# Patient Record
Sex: Female | Born: 1965 | Race: White | Hispanic: No | Marital: Married | Smoking: Never smoker
Health system: Southern US, Community
[De-identification: ages and names within clinical notes are randomized; demographics above are authoritative.]

## PROBLEM LIST (undated history)

## (undated) DIAGNOSIS — E162 Hypoglycemia, unspecified: Secondary | ICD-10-CM

## (undated) DIAGNOSIS — K589 Irritable bowel syndrome without diarrhea: Secondary | ICD-10-CM

## (undated) DIAGNOSIS — Z923 Personal history of irradiation: Secondary | ICD-10-CM

## (undated) HISTORY — DX: Irritable bowel syndrome, unspecified: K58.9

## (undated) HISTORY — DX: Personal history of irradiation: Z92.3

## (undated) HISTORY — DX: Hypoglycemia, unspecified: E16.2

---

## 1992-10-11 HISTORY — PX: HERNIA REPAIR: SHX51

## 2007-08-03 ENCOUNTER — Ambulatory Visit: Payer: Self-pay | Admitting: Internal Medicine

## 2007-08-18 ENCOUNTER — Ambulatory Visit: Payer: Self-pay | Admitting: Internal Medicine

## 2007-08-18 ENCOUNTER — Ambulatory Visit (HOSPITAL_COMMUNITY): Admission: RE | Admit: 2007-08-18 | Discharge: 2007-08-18 | Payer: Self-pay | Admitting: Internal Medicine

## 2007-11-17 ENCOUNTER — Ambulatory Visit: Payer: Self-pay | Admitting: Internal Medicine

## 2008-06-06 ENCOUNTER — Ambulatory Visit: Payer: Self-pay | Admitting: Internal Medicine

## 2011-02-23 NOTE — Assessment & Plan Note (Signed)
NAME:  Tammie Gilmore, Tammie Gilmore                  CHART#:  [id number]   DATE:  06/06/2008                       DOB:  [birth year]   PROBLEM LIST:  1. IBS, constipation predominant.  2. Ileocolonoscopy on August 18, 2007, by Dr. Jena Gauss, was normal.  3. TSH and calcium level normal, October 2008.   SUBJECTIVE:  The patient is a 45 year old lady with a history of IBS,  constipation predominant.  She was last seen in February 2009.  She  tends to have chronic constipation with occasional episodes of diarrhea,  usually if she is anxious about something and sometimes after certain  foods.  Two weeks out of the month, usually the first week before her  cycle and during her cycle, her bowel movements are more frequent and  sometimes even more loose.  Recently, she has been having increasing  difficulty with constipation.  About a week ago, she ate some home made  ice cream and this caused left lower quadrant abdominal pain.  She  states she is very uncomfortable.  She relates she had not been to the  bathroom in several days.  She has had a few stools and she started  taking [employer] one daily.  She does not feel like she has  had complete relief.  She denies any blood in the stool, melena,  abdominal pain, nausea, or vomiting.   CURRENT MEDICATIONS:  [facility] one daily.   ALLERGIES:  No known drug allergies.   PHYSICAL EXAMINATION:  VITAL SIGNS:  Weight 130 up 2 pounds, temp 98.2,  blood pressure 100/64, and pulse 80.  GENERAL:  A pleasant, well-nourished, well-developed, Caucasian female,  in no acute distress.  SKIN:  Warm and dry.  No jaundice.  ABDOMEN:  Positive bowel sounds.  Abdomen is soft, nontender, and  nondistended.  No organomegaly or masses.  No rebound or guarding.   IMPRESSION:  Constipation with occasional diarrhea likely due to  irritable bowel syndrome.  Unremarkable ileocolonoscopy previously.  We  did discuss today about remote possibility of bloating  and bowel issues  to be due to celiac disease.   PLAN:  1. High-fiber diet.  2. Complete a 30-day course of [employer].  3. May use Colace 100 mg daily as well as FiberChoice 2 tablets daily.  4. May use Amitiza 8 mcg up to b.i.d. p.r.n. constipation to take with      food.  She is advised against pregnancy while on the medication due      to the risk of miscarriage.  5. Celiac disease antibody panel and IgA level.  6. She is due for a physical.  I have asked her to follow up with Dr.      Ralph Dowdy.  7. Office visit on a p.r.n. basis.       Tana Coast, P.A.  Electronically Signed     R. Roetta Sessions, M.D.  Electronically Signed    LL/MEDQ  D:  06/06/2008  T:  06/06/2008  Job:  [id number]   cc:   Ernestina Penna

## 2011-02-23 NOTE — H&P (Signed)
NAME:  Tammie Gilmore, Tammie Gilmore                 ACCOUNT NO.:  [id number]   MEDICAL RECORD NO.:  [id number]          PATIENT TYPE:  AMB   LOCATION:  DAY                           FACILITY:  [facility]   PHYSICIAN:  R. Roetta Sessions, M.D. DATE OF BIRTH:  1966/07/12   DATE OF ADMISSION:  08/03/2007  DATE OF DISCHARGE:  LH                              HISTORY & PHYSICAL   REASON FOR VISIT:  This is a History and Physical for a colonoscopy.   CHIEF COMPLAINT:  Chronic constipation, worse over the last 2  months/Hemoccult positive stool.   HISTORY OF PRESENT ILLNESS:  Tammie Gilmore is a 45 year old Caucasian  female with longstanding history of constipation.  She describes  symptoms even as a child with being unable to go the bathroom.  Her  mother used to give her a yellow liquid to have a bowel movement.  She  had been seen in consultation by myself and Dr. Jena Gauss four years ago and  was felt to have IBS constipation.  She increased her daily fiber and  was using stool softeners and was doing very well.  She was scheduled to  come back for follow-up on December 12, 2003, and she did not show up for  that appointment.  She tells me she was getting along very nicely until  about 2 months ago.  She tells me she has had the sensation where she  just feels as though she cannot go to the bathroom.  She always has  symptoms of chronic constipation where she will go 2-3 days with a bowel  movement, but more recently she went up to a week without a bowel  movement.  She has significant cramping and a stabbing pain to the left  lower quadrant.  She tells me she has a sensation of labor pains.  She  also complained of an inability to defecate.  She feels as though she  pushes but cannot get things out.  She tells me she has also been under  a significant amount of stress.  She occasionally has episodes of  diarrhea, especially when she goes out to eat or eats certain foods.  She has a hard time tolerating mayonnaise or  fried foods.  She has had  symptoms with alternating diarrhea and constipation for years but mostly  constipation.  She denies any rectal bleeding or melena.  Denies any  anorexia or early satiety.  Her weight is actually up 9 pounds since we  saw her four years ago   PAST MEDICAL AND SURGICAL HISTORY:  1. History of hyperglycemia.  2. History of IBSC.  3. Gestational diabetes.  4. 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 Orvan Falconer in the past.  5. Hernia repair in 1994.   CURRENT MEDICATIONS:  Over-the-counter fiber supplement once daily,  stool softeners p.r.n.   ALLERGIES:  NO KNOWN DRUG ALLERGIES.   FAMILY HISTORY:  Both parents deceased secondary coronary disease at age  46 and 51.  She has two brothers, one with hypertension  and one with  IBS.   SOCIAL HISTORY:  Tammie Gilmore is married.  She has three healthy children.  She is employed with [employer].  She denies any tobacco,  alcohol or drug use.   REVIEW OF SYSTEMS:  See HPI, otherwise negative.  GYN:  She has regular  28-day cycles.  She is not trying to get pregnant, but not using birth  control at this time.   PHYSICAL EXAMINATION:  VITAL SIGNS: Weight 128 pounds, height 68 inches,  temperature 98.3, blood pressure 100/70, pulse 80.  GENERAL:  Tammie Gilmore is a well-developed, well-nourished Caucasian  female in no acute distress.  HEENT:  Sclerae are clear, nonicteric.  Conjunctivae are pink.  Oropharynx pink and moist without any lesions.  NECK:  Supple without any mass or thyromegaly.  CHEST:  Heart regular rate and rhythm with normal S1, S2 without any  murmurs, clicks, rubs or gallops.  LUNGS:  Clear to auscultation bilaterally.  ABDOMEN:  Positive bowel sounds x4.  No bruits auscultated.  Soft,  nontender, nondistended with no palpable mass or hepatosplenomegaly.  No  rebound tenderness or guarding.  RECTAL: No external lesions visualized.  Good sphincter  tone.  On  Internal exam, she has a palpable fullness at the very distal reach.  Therefore, couple of centimeters inside the rectum, there is sudden  nontender anterior fullness difficult to ascertain.  A small amount of  light brown stool was obtained in the vault which was Hemoccult  positive.  EXTREMITIES:  Without clubbing or edema bilaterally.  SKIN:  Pink, warm, dry without rash or jaundice   IMPRESSION:  Thoracic a back up to the review of systems and had GYN of  the review of systems.   IMPRESSION:  Tammie Gilmore is a 45 year old Caucasian female with chronic  constipation, more recently, over the last 2 months, she has had  significant tenesmus and the feeling that she just cannot go to the  bathroom as if there is something blocking.  On rectal exam, she does  have an anterior fullness and found to have Hemoccult positive stools.  Therefore, I feel she needs to have further evaluation given her marked  change in symptoms.   PLAN:  1. See Dr. Jena Gauss in the near future.  I discussed procedure including      risks and benefits which include but are not limited to bleeding,      infection, perforation, drug reaction.  She agrees with plan,      consent will be obtained.  2. Amitiza 8 mcg with food daily or b.i.d. as needed for constipation.      We have given her a couple of boxes of samples today in the office.  3. Constipation literature given for review.      Lorenza Burton, N.P.      Jonathon Bellows, M.D.  Electronically Signed    KJ/MEDQ  D:  08/03/2007  T:  08/04/2007  Job:  [id number]

## 2011-02-23 NOTE — Op Note (Signed)
NAME:  Tammie Gilmore, Tammie Gilmore                 ACCOUNT NO.:  [id number]   MEDICAL RECORD NO.:  [id number]          PATIENT TYPE:  AMB   LOCATION:  DAY                           FACILITY:  [facility]   PHYSICIAN:  R. Roetta Sessions, M.D. DATE OF BIRTH:  1966-01-09   DATE OF PROCEDURE:  08/18/2007  DATE OF DISCHARGE:                               OPERATIVE REPORT   DIAGNOSTIC ILEOCOLONOSCOPY:   INDICATIONS FOR PROCEDURE:  A 45 year old lady with lifelong  constipation with rare self-limiting episodes of diarrhea, who perceives  her constipation has worsened recently.  She has difficulty evacuating.  Rectal exam the office recently demonstrated Hemoccult-positive stool.  She was started on Amitiza 8 mcg daily, which she has only been taking  once daily and only p.r.n. and only approximately 3 times since her  office visit on August 03, 2007.  She stated that it softened her  stool somewhat.  Calcium came back normal at 9.3.  TSH normal at 1.15.  The colonoscopy is now being done to further evaluate her symptoms.  This approach has been discussed with the patient at length.  Potential  risks, benefits and alternatives have been reviewed, questions answered.  She is agreeable.  Please see the documentation in the medical record.   PROCEDURE NOTE:  O2 saturation, blood pressure, pulse and respirations  were monitored throughout the entire procedure.  Conscious sedation:  Versed 5 mg IV, Demerol 125 mg IV in divided doses.  Instrument:  Pentax  video chip system.   FINDINGS:  Digital rectal exam revealed no abnormalities.  Endoscopic  findings:  The prep was excellent.   Colon:  The colonic mucosa was surveyed from the rectosigmoid junction  through the left, transverse, right colon to area of the appendiceal  orifice, ileocecal valve and cecum.  These structures were well-seen and  photographed for the record.  The terminal ileum was intubated to 10 cm.  From this level the scope was slowly withdrawn  and all previously-  mentioned mucosal surfaces were again seen.  The colonic mucosa appeared  normal, as did terminal ileal mucosa.  The scope was pulled down into  the rectum.  The rectal vault was small.  I attempted to retroflex but  was unable to do so.  However, for the same reason I was able see the  rectal mucosa very well en face and it appeared normal down to the anal  verge.  The patient tolerated the procedure well, was reacted in  endoscopy.   IMPRESSION:  1. Normal rectum.  2. Normal colon.  3. Normal terminal ileum.   I suspect the patient has constipation-predominant irritable bowel  syndrome.  There may be some overlap with functional outlet obstruction  (pelvic floor dyssynergy).   RECOMMENDATIONS:  She really needs to take Amitiza every day.  I have  recommended that she start with Amitiza 8 mcg daily with breakfast for  the next 3 weeks (21 days), keep a stool diary, and call us and let us  know how she is doing with that and we will decide about upping her  medication.  Amitiza is really not to be taken p.r.n.  We will plan to  see this nice lady back in the office in 6 weeks.      Tammie Gilmore, M.D.  Electronically Signed     RMR/MEDQ  D:  08/18/2007  T:  08/19/2007  Job:  [id number]   cc:   [facility]  [place], [place]

## 2011-02-23 NOTE — Assessment & Plan Note (Signed)
NAME:  [place], DOST                  CHART#:  [id number]   DATE:  11/17/2007                       DOB:  April 08, 1966   CHIEF COMPLAINT:  Followup of IBS.   SUBJECTIVE:  M[place] is here for followup.  She was last seen at time of  colonoscopy on August 18, 2007.  She has life-long constipation and  rare self-limiting episodes of diarrhea.  She was heme positive and that  was the reasoning for a colonoscopy.  She had a normal rectum, colon and  terminal ileum.  It was felt that she had constipation, predominant IBS.  She says she has actually been doing fairly well.  She took Amitiza as  prescribed, has really been off of it for about a month or so.  Her  bowel movements have been very regular up until the last 1 week.  She  has increased her dietary fiber this past week and added fiber  supplement, although is only getting about 1 gram daily of supplement.  She has only had a couple episodes of diarrhea.  She denies any  significant abdominal pain.  She does have some cramping, which is  relieved by defecation, at times.  Denies any nausea or vomiting.  No  heartburn.  Overall, she feels well.   CURRENT MEDICATIONS:  See updated list.   ALLERGIES:  No known drug allergies.   PHYSICAL EXAM:  Weight 128, temp 99, however she had been drinking  coffee, blood pressure 98/70, pulse 60.  GENERAL:  A pleasant, well-nourished, well-developed Caucasian female in  no acute distress.  Skin warm and dry, no jaundice.  HEENT:  Sclera nonicteric.  Oropharyngeal mucosa moist and pink.  ABDOMEN:  Positive bowel sounds, soft, nontender, nondistended.  No  organomegaly or masses.  No rebound or guarding.   IMPRESSION:  Ms. Matsunaga is a pleasant 45 year old lady with chronic  constipation with occasional diarrhea.  She is felt to have  constipation, predominant irritable bowel syndrome.  She has been doing  fairly well lately.   PLAN:  1. She will continue a high fiber diet.  2. Suggested adding a  fiber supplement, approximately 3-4 grams daily,      such as Benefiber or FiberChoice.  3. Align, one daily for 4 weeks, sample was provided.  4. She may use Amitiza as needed, 8 mcg b.i.d. with food, #20 samples      provided.  She was instructed of the risk of pregnancy on this      medication.  5. IBS literature provided to the patient.  6. She will call with any further problems.  Office visit as needed.       Tana Coast, P.A.  Electronically Signed     R. Roetta Sessions, M.D.  Electronically Signed    LL/MEDQ  D:  11/17/2007  T:  11/19/2007  Job:  [id number]

## 2016-12-24 DIAGNOSIS — J309 Allergic rhinitis, unspecified: Secondary | ICD-10-CM | POA: Diagnosis not present

## 2016-12-24 DIAGNOSIS — R05 Cough: Secondary | ICD-10-CM | POA: Diagnosis not present

## 2016-12-24 DIAGNOSIS — Z6821 Body mass index (BMI) 21.0-21.9, adult: Secondary | ICD-10-CM | POA: Diagnosis not present

## 2017-07-11 DIAGNOSIS — Z124 Encounter for screening for malignant neoplasm of cervix: Secondary | ICD-10-CM | POA: Diagnosis not present

## 2017-07-11 DIAGNOSIS — Z6820 Body mass index (BMI) 20.0-20.9, adult: Secondary | ICD-10-CM | POA: Diagnosis not present

## 2017-07-11 DIAGNOSIS — Z01419 Encounter for gynecological examination (general) (routine) without abnormal findings: Secondary | ICD-10-CM | POA: Diagnosis not present

## 2018-01-30 DIAGNOSIS — R002 Palpitations: Secondary | ICD-10-CM | POA: Diagnosis not present

## 2018-01-30 DIAGNOSIS — Z6821 Body mass index (BMI) 21.0-21.9, adult: Secondary | ICD-10-CM | POA: Diagnosis not present

## 2018-02-02 DIAGNOSIS — R002 Palpitations: Secondary | ICD-10-CM | POA: Diagnosis not present

## 2018-09-27 DIAGNOSIS — Z23 Encounter for immunization: Secondary | ICD-10-CM | POA: Diagnosis not present

## 2019-05-15 DIAGNOSIS — D489 Neoplasm of uncertain behavior, unspecified: Secondary | ICD-10-CM | POA: Diagnosis not present

## 2019-05-15 DIAGNOSIS — L820 Inflamed seborrheic keratosis: Secondary | ICD-10-CM | POA: Diagnosis not present

## 2019-06-06 DIAGNOSIS — Z0000 Encounter for general adult medical examination without abnormal findings: Secondary | ICD-10-CM | POA: Diagnosis not present

## 2019-06-06 DIAGNOSIS — Z6822 Body mass index (BMI) 22.0-22.9, adult: Secondary | ICD-10-CM | POA: Diagnosis not present

## 2019-06-11 DIAGNOSIS — Z1231 Encounter for screening mammogram for malignant neoplasm of breast: Secondary | ICD-10-CM | POA: Diagnosis not present

## 2019-06-13 DIAGNOSIS — R922 Inconclusive mammogram: Secondary | ICD-10-CM | POA: Diagnosis not present

## 2019-06-13 DIAGNOSIS — N6489 Other specified disorders of breast: Secondary | ICD-10-CM | POA: Diagnosis not present

## 2019-06-26 DIAGNOSIS — Z23 Encounter for immunization: Secondary | ICD-10-CM | POA: Diagnosis not present

## 2019-06-26 DIAGNOSIS — L820 Inflamed seborrheic keratosis: Secondary | ICD-10-CM | POA: Diagnosis not present

## 2019-06-30 DIAGNOSIS — S80261A Insect bite (nonvenomous), right knee, initial encounter: Secondary | ICD-10-CM | POA: Diagnosis not present

## 2019-06-30 DIAGNOSIS — Z6822 Body mass index (BMI) 22.0-22.9, adult: Secondary | ICD-10-CM | POA: Diagnosis not present

## 2019-12-17 DIAGNOSIS — J0101 Acute recurrent maxillary sinusitis: Secondary | ICD-10-CM | POA: Diagnosis not present

## 2019-12-17 DIAGNOSIS — Z20828 Contact with and (suspected) exposure to other viral communicable diseases: Secondary | ICD-10-CM | POA: Diagnosis not present

## 2019-12-17 DIAGNOSIS — J069 Acute upper respiratory infection, unspecified: Secondary | ICD-10-CM | POA: Diagnosis not present

## 2019-12-17 DIAGNOSIS — R42 Dizziness and giddiness: Secondary | ICD-10-CM | POA: Diagnosis not present

## 2019-12-21 DIAGNOSIS — Z20828 Contact with and (suspected) exposure to other viral communicable diseases: Secondary | ICD-10-CM | POA: Diagnosis not present

## 2019-12-21 DIAGNOSIS — J069 Acute upper respiratory infection, unspecified: Secondary | ICD-10-CM | POA: Diagnosis not present

## 2019-12-21 DIAGNOSIS — R002 Palpitations: Secondary | ICD-10-CM | POA: Diagnosis not present

## 2020-01-05 DIAGNOSIS — Z20828 Contact with and (suspected) exposure to other viral communicable diseases: Secondary | ICD-10-CM | POA: Diagnosis not present

## 2020-01-06 ENCOUNTER — Ambulatory Visit: Payer: Self-pay | Attending: Internal Medicine

## 2020-01-06 DIAGNOSIS — Z23 Encounter for immunization: Secondary | ICD-10-CM

## 2020-01-06 NOTE — Progress Notes (Signed)
   Covid-19 Vaccination Clinic  Name:  Tammie Gilmore    MRN: [id number] DOB: [birth year]  01/06/2020  [name] was observed post Covid-19 immunization for 15 minutes without incident. She was provided with Vaccine Information Sheet and instruction to access the V-Safe system.   [name] was instructed to call 911 with any severe reactions post vaccine: Marland Kitchen Difficulty breathing  . Swelling of face and throat  . A fast heartbeat  . A bad rash all over body  . Dizziness and weakness   Immunizations Administered    Name Date Dose VIS Date Route   Pfizer COVID-19 Vaccine 01/06/2020  5:49 PM 0.3 mL 09/21/2019 Intramuscular   Manufacturer: [place]   Lot: [id number]   [place]: [id number]

## 2020-01-16 ENCOUNTER — Ambulatory Visit: Payer: Self-pay

## 2020-01-18 DIAGNOSIS — Z20828 Contact with and (suspected) exposure to other viral communicable diseases: Secondary | ICD-10-CM | POA: Diagnosis not present

## 2020-01-24 DIAGNOSIS — Z136 Encounter for screening for cardiovascular disorders: Secondary | ICD-10-CM | POA: Insufficient documentation

## 2020-01-24 DIAGNOSIS — Z8249 Family history of ischemic heart disease and other diseases of the circulatory system: Secondary | ICD-10-CM | POA: Diagnosis not present

## 2020-01-24 DIAGNOSIS — Z13220 Encounter for screening for lipoid disorders: Secondary | ICD-10-CM | POA: Insufficient documentation

## 2020-01-24 DIAGNOSIS — R002 Palpitations: Secondary | ICD-10-CM | POA: Diagnosis not present

## 2020-02-11 ENCOUNTER — Ambulatory Visit: Payer: Self-pay

## 2020-02-22 DIAGNOSIS — R002 Palpitations: Secondary | ICD-10-CM | POA: Diagnosis not present

## 2020-02-28 DIAGNOSIS — Z6822 Body mass index (BMI) 22.0-22.9, adult: Secondary | ICD-10-CM | POA: Diagnosis not present

## 2020-02-28 DIAGNOSIS — Z8249 Family history of ischemic heart disease and other diseases of the circulatory system: Secondary | ICD-10-CM | POA: Diagnosis not present

## 2020-02-28 DIAGNOSIS — R002 Palpitations: Secondary | ICD-10-CM | POA: Diagnosis not present

## 2020-05-26 DIAGNOSIS — J029 Acute pharyngitis, unspecified: Secondary | ICD-10-CM | POA: Diagnosis not present

## 2020-05-26 DIAGNOSIS — J309 Allergic rhinitis, unspecified: Secondary | ICD-10-CM | POA: Diagnosis not present

## 2020-05-26 DIAGNOSIS — Z20828 Contact with and (suspected) exposure to other viral communicable diseases: Secondary | ICD-10-CM | POA: Diagnosis not present

## 2020-07-17 DIAGNOSIS — J029 Acute pharyngitis, unspecified: Secondary | ICD-10-CM | POA: Diagnosis not present

## 2020-07-17 DIAGNOSIS — J019 Acute sinusitis, unspecified: Secondary | ICD-10-CM | POA: Diagnosis not present

## 2020-07-17 DIAGNOSIS — Z20828 Contact with and (suspected) exposure to other viral communicable diseases: Secondary | ICD-10-CM | POA: Diagnosis not present

## 2020-07-31 DIAGNOSIS — B079 Viral wart, unspecified: Secondary | ICD-10-CM | POA: Diagnosis not present

## 2020-07-31 DIAGNOSIS — S29011A Strain of muscle and tendon of front wall of thorax, initial encounter: Secondary | ICD-10-CM | POA: Diagnosis not present

## 2020-07-31 DIAGNOSIS — H9202 Otalgia, left ear: Secondary | ICD-10-CM | POA: Diagnosis not present

## 2020-08-21 DIAGNOSIS — Z1231 Encounter for screening mammogram for malignant neoplasm of breast: Secondary | ICD-10-CM | POA: Diagnosis not present

## 2020-09-01 DIAGNOSIS — Z20828 Contact with and (suspected) exposure to other viral communicable diseases: Secondary | ICD-10-CM | POA: Diagnosis not present

## 2020-09-01 DIAGNOSIS — R928 Other abnormal and inconclusive findings on diagnostic imaging of breast: Secondary | ICD-10-CM | POA: Diagnosis not present

## 2020-09-07 DIAGNOSIS — U071 COVID-19: Secondary | ICD-10-CM | POA: Diagnosis not present

## 2020-09-07 DIAGNOSIS — E860 Dehydration: Secondary | ICD-10-CM | POA: Diagnosis not present

## 2020-09-07 DIAGNOSIS — R059 Cough, unspecified: Secondary | ICD-10-CM | POA: Diagnosis not present

## 2020-09-07 DIAGNOSIS — Z8249 Family history of ischemic heart disease and other diseases of the circulatory system: Secondary | ICD-10-CM | POA: Diagnosis not present

## 2020-09-07 DIAGNOSIS — R0602 Shortness of breath: Secondary | ICD-10-CM | POA: Diagnosis not present

## 2021-06-19 ENCOUNTER — Ambulatory Visit
Admission: RE | Admit: 2021-06-19 | Discharge: 2021-06-19 | Disposition: A | Discharge status: Home / Self Care | Payer: Self-pay | Source: Ambulatory Visit | Attending: Gynecologic Oncology | Admitting: Gynecologic Oncology

## 2021-06-19 ENCOUNTER — Inpatient Hospital Stay: Payer: BC Managed Care – PPO | Attending: Gynecologic Oncology | Admitting: Gynecologic Oncology

## 2021-06-19 ENCOUNTER — Encounter: Payer: Self-pay | Admitting: Gynecologic Oncology

## 2021-06-19 ENCOUNTER — Other Ambulatory Visit: Payer: Self-pay

## 2021-06-19 VITALS — BP 119/71 | HR 104 | Temp 98.4°F | Resp 18 | Ht 67.0 in | Wt 144.0 lb

## 2021-06-19 DIAGNOSIS — D390 Neoplasm of uncertain behavior of uterus: Secondary | ICD-10-CM | POA: Diagnosis present

## 2021-06-19 DIAGNOSIS — K581 Irritable bowel syndrome with constipation: Secondary | ICD-10-CM | POA: Diagnosis not present

## 2021-06-19 DIAGNOSIS — E162 Hypoglycemia, unspecified: Secondary | ICD-10-CM | POA: Diagnosis not present

## 2021-06-19 DIAGNOSIS — N858 Other specified noninflammatory disorders of uterus: Secondary | ICD-10-CM

## 2021-06-19 DIAGNOSIS — K589 Irritable bowel syndrome without diarrhea: Secondary | ICD-10-CM | POA: Insufficient documentation

## 2021-06-19 DIAGNOSIS — Z8616 Personal history of COVID-19: Secondary | ICD-10-CM | POA: Insufficient documentation

## 2021-06-19 DIAGNOSIS — C539 Malignant neoplasm of cervix uteri, unspecified: Secondary | ICD-10-CM | POA: Diagnosis not present

## 2021-06-19 NOTE — Patient Instructions (Signed)
It was a pleasure meeting you.  I will call you with your biopsy results, hopefully early next week.  I will also call you once we get the MRI results back.  Based on what I see today, it looks like you are in the process of expelling (pushing out) this mass.  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  Once we get the MRI results back as well as her biopsy, then we will make a determination about whether entering other imaging studies or biopsies need to be done or whether we will proceed with surgery.

## 2021-06-19 NOTE — Progress Notes (Signed)
GYNECOLOGIC ONCOLOGY NEW PATIENT CONSULTATION   Patient Name: Tammie Gilmore  Patient Age: 55 y.o. Date of Service: 06/19/21 Referring Provider: Georgie Chard, Eneta[place]   Primary Care Provider: Curlene Labrum, MD Consulting Provider: Jeral Pinch, MD   Assessment/Plan:  Perimenopausal patient with large prolapsing lower uterine segment versus cervical mass.  Reviewed outside CT images. Discussed findings from CT as well as ultrasound with the patient. There are features on CT that raise the concern for necrosis of her prolapsing mass. Given history of known uterine fibroids, we discussed possibility that this is a degenerating uterine fibroid that is now prolapsing. I took several biopsies of the mass today and expect results back early next week. I think that an MRI may be helpful for treatment planning and the possibility of surgery, so this was ordered and scheduled today.   In addition to uterine findings on CT scan, there is what appears to be a partially necrotic pelvic lymph node. This raises the concern for malignancy. It is likely too small for percutaneous biopsy. If biopsies from today show benign findings, then we may pursue deeper mass biopsies with Trucut biopsy.  Prior ultrasounds were obtained after patient left clinic. These confirm that she was followed back in 2011 for a complex cyst and then for abnormal bleeding in 2015.   A copy of this note was sent to the patient's referring provider.   80 minutes of total time was spent for this patient encounter, including preparation, face-to-face counseling with the patient and coordination of care, and documentation of the encounter.  Jeral Pinch, MD  Division of Gynecologic Oncology  Department of Obstetrics and Gynecology  [facility]  ___________________________________________  Chief Complaint: Chief Complaint  Patient presents with   Uterine mass    History of Present Illness:   Tammie Gilmore is a 55 y.o. y.o. female who is seen in consultation at the request of Bryan Lemma FNP for an evaluation of uterine mass.  Patient reports a history of fibroids.  This was followed back in 2011 with ultrasound and again in 2015.  Patient does not remember why she initially had the ultrasound but remembers being told that her fibroids were not concerning.  Patient has been perimenopausal for the last couple of years.  She has had several periods of up to 6 months between bleeding episodes.  In mid July, she began having bleeding that was initially light but required the use of a pad.  She had ultimately some passage of clots and her bleeding lasted for about 2 weeks.  She had not been seen recently and called to get an annual exam.  After this episode of bleeding, she began having some cramping as well as pressure after she urinates and has a bowel movement.  She is unsure whether this is truly pressure or more relief.  Secondary to getting COVID in July, she did not ultimately get seen until late August.  She denies any vaginal bleeding since the episode described above in mid July.  Patient had a Pap test done and was sent for an ultrasound.  Given ultrasound findings showing a large fibroid appearing structure in the lower uterine segment, CT scan was obtained.  Patient reports a history of IBS with constipation as her norm.  She denies any changes to her bowel function recently.  She was treated for a urinary tract infection in August.  This was diagnosed shortly after she got a tick bite in July.  She  was given Augmentin both for treatment of the tick bite as well as her urinary tract infection.  She endorses a good appetite without any nausea or emesis.  She thinks she has gained about 10 pounds over the last 1 to 2 years.  Patient lives in [place] with her husband and her oldest son.  She works as an Optometrist.  PAST MEDICAL HISTORY:  Past Medical History:  Diagnosis Date    Hypoglycemia    occasional episodes of hypoglycemia   IBS (irritable bowel syndrome)      PAST SURGICAL HISTORY:  Past Surgical History:  Procedure Laterality Date   HERNIA REPAIR  1994   left inguinal, with mesh    OB/GYN HISTORY:  OB History  Gravida Para Term Preterm AB Living  4 3          SAB IAB Ectopic Multiple Live Births               # Outcome Date GA Lbr Len/2nd Weight Sex Delivery Anes PTL Lv  4 Gravida           3 Para           2 Para           1 Para             No LMP recorded.  Age at menarche: 47 Age at menopause: 11 Hx of HRT: Denies Hx of STDs: Denies Last pap: 2022, ASCUS, HPV negative History of abnormal pap smears: Only most recent Pap smear, otherwise Pap smears normal  SCREENING STUDIES:  Last mammogram: 04/2021  Last colonoscopy: Approximately 10 years ago  MEDICATIONS: No outpatient encounter medications on file as of 06/19/2021.   No facility-administered encounter medications on file as of 06/19/2021.    ALLERGIES:  Allergies  Allergen Reactions   Doxycycline     Dizziness, NAUSEA     FAMILY HISTORY:  Family History  Problem Relation Age of Onset   Heart attack Mother    Heart disease Mother    Endometriosis Mother    Heart disease Father    Heart attack Father    Cancer - Colon Neg Hx    Breast cancer Neg Hx    Cancer Neg Hx    Ovarian cancer Neg Hx    Uterine cancer Neg Hx    Pancreatic cancer Neg Hx    Pancreatic disease Neg Hx    Prostate cancer Neg Hx      SOCIAL HISTORY:  Social Connections: Not on file    REVIEW OF SYSTEMS:  Pertinent positives include urinary frequency, vaginal discharge, anxiety, swollen glands in her neck. Denies appetite changes, fevers, chills, fatigue, unexplained weight changes. Denies hearing loss, neck lumps or masses, mouth sores, ringing in ears or voice changes. Denies cough or wheezing.  Denies shortness of breath. Denies chest pain or palpitations. Denies leg swelling. Denies  abdominal distention, pain, blood in stools, constipation, diarrhea, nausea, vomiting, or early satiety. Denies pain with intercourse, dysuria, hematuria or incontinence. Denies hot flashes, pelvic pain, vaginal bleeding.   Denies joint pain, back pain or muscle pain/cramps. Denies itching, rash, or wounds. Denies dizziness, headaches, numbness or seizures. Denies denies easy bruising or bleeding. Denies depression, confusion, or decreased concentration.  Physical Exam:  Vital Signs for this encounter:  Blood pressure 119/71, pulse (!) 104, temperature 98.4 F (36.9 C), temperature source Oral, resp. rate 18, height '5\' 7"'$  (1.702 m), weight 144 lb (65.3 kg), SpO2 100 %. Body mass  index is 22.55 kg/m. General: Alert, oriented, no acute distress.  HEENT: Normocephalic, atraumatic. Sclera anicteric.  Chest: Clear to auscultation bilaterally. No wheezes, rhonchi, or rales. Cardiovascular: Regular rate (90s) and rhythm, no murmurs, rubs, or gallops.  Abdomen: Normoactive bowel sounds. Soft, nondistended, nontender to palpation. No masses or hepatosplenomegaly appreciated. No palpable fluid wave.  Extremities: Grossly normal range of motion. Warm, well perfused. No edema bilaterally.  Skin: No rashes or lesions.  Lymphatics: No cervical, supraclavicular, or inguinal adenopathy.  GU:  Normal external female genitalia. No lesions. No discharge or bleeding.             Bladder/urethra:  No lesions or masses, well supported bladder             Vagina: Well rugated, no lesions.             Cervix: Cervix is dilated, significantly so that on speculum exam I cannot appreciate the posterior cervix.  The anterior cervix is visually normal.  There is a mass prolapsing up to but not through the external cervical os dilating it visually such that it fills the vagina.  The mass itself appears necrotic versus degenerating, some atypical mass, mostly smooth but some areas that almost looks cystic.  On bimanual  exam, the cervix feels to be dilated approximately 8 cm with normalcy cervix around the entire mass.  There are a couple areas with some nodularity along the mass, most of it is smooth.              Uterus: Approximately 10-12 cm, quite bulbous within the cervix and lower uterine segment, minimally mobile.  Notable nodularity or parametrial involvement appreciated.               Adnexa: No masses appreciated.  Rectal: See above.  Prolapsing mass biopsy procedure Preoperative diagnosis: Prolapsing intracervical mass Postoperative diagnosis: Same as above Physician: Berline Lopes MD Estimated blood loss: 20 cc Specimens: Multiple mass biopsies Procedure: After the procedure including risks and alternatives were discussed with the patient, she gave verbal consent.  Patient was placed in dorsolithotomy position and a speculum was placed in the vagina.  Findings were as noted above.  The mass was cleansed x3 with Betadine.  3 biopsies were taken with Tischler forceps and placed in formalin.  Commendation of pressure and silver nitrate was used to achieve hemostasis.  Overall the patient tolerated the procedure well.  LABORATORY AND RADIOLOGIC DATA:  Outside medical records were reviewed to synthesize the above history, along with the history and physical obtained during the visit.   No results found for: WBC, HGB, HCT, PLT, GLUCOSE, CHOL, TRIG, HDL, LDLDIRECT, LDLCALC, ALT, AST, NA, K, CL, CREATININE, BUN, CO2, TSH, PSA, INR, GLUF, HGBA1C, MICROALBUR  Pelvic ultrasound exam performed at an outside facility on 8/30 shows a uterus measuring 12.8 x 8 x 9 cm, retroverted.  Heterogenous myometrium noted.  Large leiomyoma with shadowing at mid to lower uterus measuring up to 7.7 cm likely extending submucosal based on size and position through endometrial complex.  This is poorly seen in the lower uterine segment.  Endometrium is 5 mm at the fundus, and asked to quickly visualized in the lower uterine segment.   Bilateral ovaries normal without apparent mass.  CT scan of the abdomen and pelvis performed on 9/3: There is a 9.5 x 7.6 x 9 cm largely necrotic mass involving the lower uterine segment and cervix thought to be either endometrial or cervical in origin, possibly concerning for  cancer.  No obvious direct extension into the parametrium.  Ovaries noted to be unremarkable.  2.9 cm fundal fibroid noted.  There is a 9 mm left pelvic sidewall lymph node that is partially necrotic, worrisome for metastatic adenopathy.  No other overt adenopathy noted.  No omental or peritoneal soft tissue masses identified.  Records obtained after the patient left from diagnostic imaging in [place]: Pelvic ultrasound in 06/2014 for irregular menses: Uterus measures 9.1 x 5.1 x 6.3 cm.  There is a up to 3.6 cm intramural fibroid in the posterior lower uterine segment.  There is an up to 4.6 cm partially exophytic fibroid within the posterior uterine fundus.  There is an additional 2.1 cm intraluminal fibroid within the posterior uterine body.  Lateral ovaries normal-appearing. Pelvic ultrasound in June 2011: Uterus measures 9 cm in greatest diameter with an exophytic fibroid in the posterior uterine body.  Right ovary normal-appearing.  Left ovary measures up to 3.9 cm with what appears to be a resolving hemorrhagic cyst that measures 2.5 cm on today's exam. Pelvic ultrasound from April 2011: Right ovary with thin up to 1.8 cm cystic lesion with septations.  Left ovary measures up to 4.6 cm.  There is a complex cystic lesion with internal echoes and septations measuring up to 3.3 cm in the left ovary.

## 2021-06-22 ENCOUNTER — Other Ambulatory Visit: Payer: Self-pay

## 2021-06-22 ENCOUNTER — Ambulatory Visit (HOSPITAL_COMMUNITY)
Admission: RE | Admit: 2021-06-22 | Discharge: 2021-06-22 | Disposition: A | Discharge status: Home / Self Care | Payer: BC Managed Care – PPO | Source: Ambulatory Visit | Attending: Gynecologic Oncology | Admitting: Gynecologic Oncology

## 2021-06-22 DIAGNOSIS — N858 Other specified noninflammatory disorders of uterus: Secondary | ICD-10-CM

## 2021-06-22 MED ORDER — GADOBUTROL 1 MMOL/ML IV SOLN
7.0000 mL | Freq: Once | INTRAVENOUS | Status: AC | PRN
  Administered 2021-06-22: 7 mL via INTRAVENOUS

## 2021-06-23 ENCOUNTER — Telehealth: Payer: Self-pay | Admitting: *Deleted

## 2021-06-23 ENCOUNTER — Telehealth: Payer: Self-pay | Admitting: Gynecologic Oncology

## 2021-06-23 ENCOUNTER — Other Ambulatory Visit: Payer: Self-pay | Admitting: Gynecologic Oncology

## 2021-06-23 DIAGNOSIS — N858 Other specified noninflammatory disorders of uterus: Secondary | ICD-10-CM

## 2021-06-23 NOTE — Telephone Encounter (Signed)
Called the patient to discuss MRI findings.  Additionally, received a phone call from pathologist reviewing her biopsies, which are concerning for cancer (sounds like differential includes leiomyosarcoma versus cervical cancer).  It is likely that they will need more tissue to help with definitive diagnosis.  I have asked the patient to return on Friday for Tru-Cut biopsies here in clinic.  Have also ordered a PET scan to evaluate for metastatic disease given either locally advanced cervix cancer versus leiomyosarcoma.  Jeral Pinch MD Gynecologic Oncology

## 2021-06-23 NOTE — Telephone Encounter (Signed)
Tammie Gilmore called to let us know that she may be having low grade fevers in the evenings. Requested for her to take her temp and let us know if she has a fever. She notified us that she had a recent chest xray though her family doctor. I will call the office to obtain a copy of the xray. She was made aware of her upcoming PET scan at [facility] for 07/02/21 at 10:00am. Nothing to eat or dink , except water for 6 hours prior to the PET. She is to arrive 15 minutes before her appt. Time. Tammie Gilmore verbalized understanding.

## 2021-06-24 ENCOUNTER — Telehealth: Payer: Self-pay | Admitting: Gynecologic Oncology

## 2021-06-24 LAB — SURGICAL PATHOLOGY

## 2021-06-24 NOTE — Telephone Encounter (Signed)
Called patient to discuss pathology results - appears this is likely uterine in origin, either LMS or carcinosarcoma. Pathologist does think that additional biopsies are likely to be helping in narrowing differential.   Plan is for PET scan - scheduled next week.  Given biopsy results (pending PET results regarding distant metastatic disease), surgery is likely to be the first step in treatment. This would involve a radical hysterectomy (given what appears to be parametrial invasion) and possible colon resection given MRI findings concerning for possible rectal invasion. Depending on findings at the time of surgery, it is possible that colostomy rather than reanastomosis would be performed (if risk of low anastomosis, given anticipated adjuvant treatment). In the setting of stoma formation, this might be able to be reversed at some point in the future.  I will look at my surgery schedule at [facility] and also possibility of surgery at [facility]. I do not want to delay surgery longer than necessary and patient's surgery and postoperative recovery may be complex. Patient is amenable to having surgery at either location.  Jeral Pinch MD Gynecologic Oncology

## 2021-06-26 ENCOUNTER — Inpatient Hospital Stay: Payer: BC Managed Care – PPO

## 2021-06-26 ENCOUNTER — Ambulatory Visit: Payer: BC Managed Care – PPO | Admitting: Gynecologic Oncology

## 2021-06-26 ENCOUNTER — Telehealth: Payer: Self-pay | Admitting: *Deleted

## 2021-06-26 ENCOUNTER — Other Ambulatory Visit: Payer: Self-pay

## 2021-06-26 ENCOUNTER — Telehealth: Payer: Self-pay

## 2021-06-26 DIAGNOSIS — N950 Postmenopausal bleeding: Secondary | ICD-10-CM

## 2021-06-26 DIAGNOSIS — D390 Neoplasm of uncertain behavior of uterus: Secondary | ICD-10-CM | POA: Diagnosis not present

## 2021-06-26 LAB — CBC WITH DIFFERENTIAL (CANCER CENTER ONLY)
Abs Immature Granulocytes: 0.04 10*3/uL (ref 0.00–0.07)
Basophils Absolute: 0 10*3/uL (ref 0.0–0.1)
Basophils Relative: 0 %
Eosinophils Absolute: 0 10*3/uL (ref 0.0–0.5)
Eosinophils Relative: 0 %
HCT: 38.4 % (ref 36.0–46.0)
Hemoglobin: 12.8 g/dL (ref 12.0–15.0)
Immature Granulocytes: 0 %
Lymphocytes Relative: 20 %
Lymphs Abs: 1.9 10*3/uL (ref 0.7–4.0)
MCH: 29.2 pg (ref 26.0–34.0)
MCHC: 33.3 g/dL (ref 30.0–36.0)
MCV: 87.7 fL (ref 80.0–100.0)
Monocytes Absolute: 0.4 10*3/uL (ref 0.1–1.0)
Monocytes Relative: 5 %
Neutro Abs: 7.1 10*3/uL (ref 1.7–7.7)
Neutrophils Relative %: 75 %
Platelet Count: 347 10*3/uL (ref 150–400)
RBC: 4.38 MIL/uL (ref 3.87–5.11)
RDW: 12.2 % (ref 11.5–15.5)
WBC Count: 9.5 10*3/uL (ref 4.0–10.5)
nRBC: 0 % (ref 0.0–0.2)

## 2021-06-26 NOTE — Telephone Encounter (Signed)
Tammie Gilmore called to report some lightheadedness and bleeding. The bleeding is bright red at times, but a small amount. She states that she has not been eating and drinking as much as she normally does since receiving the results of her MRI. She is trying to eat and drink more and reports that she feels less dizzy as the day goes on. After speaking w/ M. Cross NP it is recommended that Tammie Gilmore call back at at 1:00 to let us know how she is feeling, snd we can schedule a lab appointment for CBC if needed. Pt is agreeable with plan of care.

## 2021-06-26 NOTE — Telephone Encounter (Signed)
Received call from Tammie Gilmore, she states she is still having dizziness and lightheadedness. She has been trying to eat and drink more and feels it has improved slightly. Patient states she would like to have her labs checked today, her husband is going to drive her. Lab appointment scheduled for  2:45pm. Patient is in agreement of date and time of appointment.

## 2021-06-26 NOTE — Telephone Encounter (Signed)
I let Tammie Gilmore know that her CBC is stable her Hbg is 12.8 which is nearly the same as it was in 08/2020. She has the after hours number to call with any concerns over the weekend.

## 2021-06-29 ENCOUNTER — Ambulatory Visit
Admission: RE | Admit: 2021-06-29 | Discharge: 2021-06-29 | Disposition: A | Discharge status: Home / Self Care | Payer: BC Managed Care – PPO | Source: Ambulatory Visit | Attending: Gynecologic Oncology | Admitting: Gynecologic Oncology

## 2021-06-29 ENCOUNTER — Telehealth: Payer: Self-pay | Admitting: Gynecologic Oncology

## 2021-06-29 ENCOUNTER — Other Ambulatory Visit: Payer: Self-pay

## 2021-06-29 DIAGNOSIS — E041 Nontoxic single thyroid nodule: Secondary | ICD-10-CM | POA: Diagnosis not present

## 2021-06-29 DIAGNOSIS — N858 Other specified noninflammatory disorders of uterus: Secondary | ICD-10-CM | POA: Insufficient documentation

## 2021-06-29 LAB — GLUCOSE, CAPILLARY: Glucose-Capillary: 80 mg/dL (ref 70–99)

## 2021-06-29 MED ORDER — FLUDEOXYGLUCOSE F - 18 (FDG) INJECTION
7.5000 | Freq: Once | INTRAVENOUS | Status: AC | PRN
  Administered 2021-06-29: 7.81 via INTRAVENOUS

## 2021-06-29 NOTE — Telephone Encounter (Signed)
I called the patient to discuss PET scan results from today.  No answer.  Left message requesting callback.  Valarie Cones, MD

## 2021-06-30 ENCOUNTER — Telehealth: Payer: Self-pay

## 2021-06-30 NOTE — Telephone Encounter (Signed)
Spoke with Tammie Gilmore regarding her after hours call on 06/27/21. Patient states she was having severe anxiety and was not able to eat over the weekend. She spoke with a triage nurse over who suggested she go to ER. Patient did not feel like she needed to go to the ER. She spoke with Dr. Berline Lopes yesterday and she states she feels more at peace and stronger now. Instructed patient to call with any questions or concerns.

## 2021-07-02 ENCOUNTER — Encounter (HOSPITAL_COMMUNITY): Payer: BC Managed Care – PPO

## 2021-07-14 ENCOUNTER — Telehealth: Payer: Self-pay | Admitting: *Deleted

## 2021-07-14 NOTE — Telephone Encounter (Signed)
Spoke with the patient per [facility] note to have the COVID test done at her PCP, if results not back from that on Friday [facility] will run a test that morning

## 2021-07-14 NOTE — Telephone Encounter (Addendum)
Patient called and stated "I talked with Nia at [facility] and was worried about the COVID test and results. I can have it at my PCP but the results may not be back by Friday. Mia suggested I reach out the [place] office since I see Dr Berline Lopes there and I could have the COVID test done there. Also I am having some light pressure in my lower pelvic/groin area. It has been doing on, but is a little worse. I did back off the motrin because I have scared I was taking to much. I did start taken it again. It helps a little, just wanted Dr Berline Lopes to know. Also the pressure is worse once I empty my bladder." Explained that both Melissa APP and Dr Berline Lopes are in the [place] and someone will call her back

## 2021-07-22 ENCOUNTER — Telehealth: Payer: Self-pay

## 2021-07-22 NOTE — Telephone Encounter (Signed)
Spoke with Tammie Gilmore this afternoon. She states she had some nausea yesterday. She had several bowel movements and after the last BM she was nauseated and took Zofran. She reports feeling better today and is eating several smaller meals throughout the day. She is drinking ok and foley catheter is draining. She denies fever or chills. Incisions are dry and intact. She rates her pain as a 4/10, pain is controlled with extra strength tylenol.   Patient reports she is afraid to take a shower even though the nurses at [facility] showed her how to shower with her incision and foley. She states she is afraid of falling. Patient reports her husband is with her while she is recovering and he is going to have him help her.   Instructed to call office with any fever, chills, purulent drainage, uncontrolled pain or any other questions or concerns. Patient verbalizes understanding.   Pt aware of post op appointments as well as the office number [phone] and after hours number [phone] to call if she has any questions or concerns

## 2021-07-24 ENCOUNTER — Inpatient Hospital Stay: Payer: BC Managed Care – PPO | Attending: Gynecologic Oncology

## 2021-07-24 ENCOUNTER — Other Ambulatory Visit: Payer: Self-pay

## 2021-07-24 DIAGNOSIS — C55 Malignant neoplasm of uterus, part unspecified: Secondary | ICD-10-CM | POA: Insufficient documentation

## 2021-07-24 DIAGNOSIS — Z90722 Acquired absence of ovaries, bilateral: Secondary | ICD-10-CM | POA: Insufficient documentation

## 2021-07-24 DIAGNOSIS — C775 Secondary and unspecified malignant neoplasm of intrapelvic lymph nodes: Secondary | ICD-10-CM | POA: Insufficient documentation

## 2021-07-24 DIAGNOSIS — Z90710 Acquired absence of both cervix and uterus: Secondary | ICD-10-CM | POA: Insufficient documentation

## 2021-07-24 NOTE — Progress Notes (Unsigned)
GYN Oncology Post-operative Follow Up   Tammie Gilmore is a 55 year old female with a history of a uterine mass who underwent a radical abdominal hysterectomy, with bilateral salpingo oophorectomy, upper vaginectomy, bilateral pelvic lymphadenectomy, and bilateral ureterolysis on 07/17/21 with Dr. Jeral Pinch. She presents to the office today for foley catheter removal and trial voiding.    She states she has been doing well at home. Tolerating small meals with no nausea or emesis.  She states her pain is manageable.  Her bowels are moving without difficulty. Last BM this morning, She states she has had adequate urine output in the catheter at home. She denies any vaginal bleeding or discharge.  Denies chest pain and shortness of breath.  No fever.  No lower extremity edema reported.  No concerns voiced.   Exam: Alert, oriented x 3, in no acute distress.    Clear, yellow urine noted in the catheter bag. 260 cc of sterile normal saline instilled in the bladder via the catheter. Patient tolerated this well. Foley was removed without difficulty. The patient was able to void 250 cc after foley removal.    Assessment/Plan: 55 year old s/p radical abdominal hysterectomy, with bilateral salpingo oophorectomy, upper vaginectomy, bilateral pelvic lymphadenectomy, and bilateral ureterolysis presenting to the office for foley catheter removal and voiding trial. She was able to void 250 cc without difficulty after instillation of 260 cc of sterile normal saline. Reportable signs and symptoms reviewed including signs of urinary retention. She is advised to attempt to void every 2-3 hours and to record her output. A urine measuring collector "hat" given to the patient. No concerns voiced at the visit.      This appointment is included in the global surgical bundle and has no charge.

## 2021-07-24 NOTE — Patient Instructions (Signed)
Today we removed the foley catheter from your bladder. You will need to attempt to go void every 2-3 hours and document how much you are getting out starting now at home.    If you feel you are not emptying your bladder completely, develop lower abdomen pain/pressure, begin feeling poorly/fever, please call the office at [phone] or on call MD at [phone] if it is after hours

## 2021-07-28 ENCOUNTER — Telehealth: Payer: Self-pay | Admitting: *Deleted

## 2021-07-28 NOTE — Telephone Encounter (Signed)
Patient called and stated "I wanted to see if Dr Tammie Gilmore would call in the magnesium and potassium in capsules, so they are smaller to take. Or change to the meds to something smaller and easier to take."

## 2021-07-29 ENCOUNTER — Telehealth: Payer: Self-pay | Admitting: Gynecologic Oncology

## 2021-07-29 NOTE — Telephone Encounter (Signed)
Called Medea back and advised that per Dr. Berline Lopes, her K and MG were normal at the time of discharge.  She doesn't see a good reason for her to be on them, so she can stop them and we will recheck her labs at her next appointment or we can check with pharmacy to see if there is something smaller.  Tyashia verbalized understanding and said she is going to stop taking them and agrees with checking labs at her next visit.

## 2021-07-29 NOTE — Telephone Encounter (Signed)
Called patient to discuss [facility] TB recommendations. No answer. Left voicemail with call back number.  Jeral Pinch MD Gynecologic Oncology

## 2021-07-30 ENCOUNTER — Telehealth: Payer: Self-pay | Admitting: Gynecologic Oncology

## 2021-07-30 NOTE — Telephone Encounter (Signed)
Spoke with the patient about [facility] tumor board recommendations in terms of treatment.  Since the patient prefers to get treatment here at [facility] long.  I reached out to radiation oncology and medical oncology.  Their preference is that we present the patient at our tumor board.  I think this is a good idea as we will have to have a discussion about order of treatment.  Jeral Pinch MD Gynecologic Oncology

## 2021-08-03 ENCOUNTER — Other Ambulatory Visit: Payer: Self-pay | Admitting: Oncology

## 2021-08-03 ENCOUNTER — Telehealth: Payer: Self-pay | Admitting: Radiation Oncology

## 2021-08-03 ENCOUNTER — Other Ambulatory Visit: Payer: Self-pay | Admitting: Gynecologic Oncology

## 2021-08-03 ENCOUNTER — Telehealth: Payer: Self-pay | Admitting: Oncology

## 2021-08-03 DIAGNOSIS — C775 Secondary and unspecified malignant neoplasm of intrapelvic lymph nodes: Secondary | ICD-10-CM

## 2021-08-03 DIAGNOSIS — C55 Malignant neoplasm of uterus, part unspecified: Secondary | ICD-10-CM

## 2021-08-03 MED ORDER — FLUCONAZOLE 150 MG PO TABS: 150.0000 mg | ORAL_TABLET | Freq: Every day | ORAL | 0 refills | Status: AC

## 2021-08-03 NOTE — Progress Notes (Signed)
CT ordered per Dr. Berline Lopes. 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 Surgery at [facility]. Re-evaluate the abdomen and pelvis post-op prior to starting treatment, evaluate for other signs of metastatic disease.

## 2021-08-03 NOTE — Telephone Encounter (Signed)
Called Tammie Gilmore back and she clarified that the itching is from the front of her urethra..  She also wants to reschedule her CT scan because she has another appointment that day. Rescheduled CT to 08/10/21 and also advised that she can either use monistat cream externally or we can send in a prescription for Diflucan.  She would like the prescription for Diflucan sent to the [facility] in [place].

## 2021-08-03 NOTE — Telephone Encounter (Signed)
Called Betsaida and advised her of Gyn Onc Tumor Board recommendation for a baseline CT scan.  Went over appointment time and instructions.  Also advised her of post op appointment with Dr. Berline Lopes on 08/10/21. Advised her that Radiation Oncology will be calling her to schedule an appointment with Dr. Sondra Come.  She verbalized understanding and agreement of appointments and plan.  She mentioned that she is having some itching in the front of her vaginal area like a yeast infection.  The itching was milder over the weekend but is worse this morning.  She reports feeling a "pinch" when she first starts to urinate but denies having any other urinary symptoms. Advised her that I will notify Joylene John, NP and will call back with any recommendations.

## 2021-08-03 NOTE — Progress Notes (Signed)
Gynecologic Oncology Multi-Disciplinary Disposition Conference Note  Date of the Conference: 08/03/2021  Patient Name: Tammie Gilmore  Referring Provider: Bryan Lemma, FNP Primary GYN Oncologist: Dr. Berline Lopes  Stage/Disposition:  Stage IIIC leiomyosarcoma of the uterus. Disposition is to CT scan for restaging followed by external beam radiation with consideration for systemic chemotherapy after radiation is completed.   This Multidisciplinary conference took place involving physicians from [place], [place], Radiation Oncology, Pathology, Radiology along with the Gynecologic Oncology Nurse Practitioner and RN.  Comprehensive assessment of the patient's malignancy, staging, need for surgery, chemotherapy, radiation therapy, and need for further testing were reviewed. Supportive measures, both inpatient and following discharge were also discussed. The recommended plan of care is documented. Greater than 35 minutes were spent correlating and coordinating this patient's care.

## 2021-08-06 ENCOUNTER — Telehealth: Payer: Self-pay | Admitting: Oncology

## 2021-08-06 ENCOUNTER — Encounter: Payer: Self-pay | Admitting: Hematology and Oncology

## 2021-08-06 DIAGNOSIS — C55 Malignant neoplasm of uterus, part unspecified: Secondary | ICD-10-CM | POA: Insufficient documentation

## 2021-08-06 NOTE — Telephone Encounter (Addendum)
Eliezer Lofts and scheduled new patient appointment with Dr. Alvy Bimler on 08/10/2021 at 3:00.  She verbalized understanding and agreement.

## 2021-08-07 ENCOUNTER — Ambulatory Visit (HOSPITAL_COMMUNITY): Payer: BC Managed Care – PPO

## 2021-08-07 NOTE — Progress Notes (Incomplete)
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, pleomorphic, and myxoid areas and associated necrosis (~20%) - Tumor based in cervix and also involves lower uterine segment - Cervicovaginal margin involved by focal invasive leiomyosarcoma (3:00-5:00, A10) as well as tumor in lymphovascular spaces - Leiomyosarcoma involves right and left parametrial tissue and extends to parametrial margins - Extensive lymphovascular space invasion present, including in uterus and parametria - See synoptic report and comment   Other findings: - Leiomyomata with hyalinization, size up to 3.0 cm - Ovaries and fallopian tubes with no parenchymal involvement by leiomyosarcoma identified, although adnexal lymphovascular space invasion is present   B: Lymph nodes, right pelvic, lymphadenectomy - One of four lymph nodes positive for metastatic leiomyosarcoma (1/4), with extracapsular extension present   C: Lymph nodes, left pelvic, lymphadenectomy - One of four lymph nodes positive for metastatic leiomyosarcoma (1/4), with extracapsular extension present    Past/Anticipated interventions by Gyn/Onc surgery, if any: radical abdominal hysterectomy, with bilateral salpingo oophorectomy, upper vaginectomy, bilateral pelvic lymphadenectomy, and bilateral ureterolysis on 07/17/21 with Dr. Jeral Pinch  Past/Anticipated interventions by medical oncology, if any: not at this time  Weight changes, if any: {:18581}  Bowel/Bladder complaints, if any: {yes no:314532},  {Blank single:19197::"diarrhea","constipation","urinary frequency","burning","trouble emptying bladder"," "}  Nausea/Vomiting, if any: {:18581}  Pain issues, if any:  {:18581}  SAFETY ISSUES: Prior radiation? {:18581} Pacemaker/ICD? {:18581} Possible current pregnancy? no, hysterectomy Is the patient on methotrexate? {:18581}  Current Complaints / other details:  ***  ***

## 2021-08-10 ENCOUNTER — Inpatient Hospital Stay: Payer: BC Managed Care – PPO | Admitting: Hematology and Oncology

## 2021-08-10 ENCOUNTER — Ambulatory Visit (HOSPITAL_COMMUNITY)
Admission: RE | Admit: 2021-08-10 | Discharge: 2021-08-10 | Disposition: A | Discharge status: Home / Self Care | Payer: BC Managed Care – PPO | Source: Ambulatory Visit | Attending: Gynecologic Oncology | Admitting: Gynecologic Oncology

## 2021-08-10 ENCOUNTER — Encounter: Payer: Self-pay | Admitting: Gynecologic Oncology

## 2021-08-10 ENCOUNTER — Other Ambulatory Visit: Payer: Self-pay

## 2021-08-10 ENCOUNTER — Encounter (HOSPITAL_COMMUNITY): Payer: Self-pay

## 2021-08-10 ENCOUNTER — Inpatient Hospital Stay (HOSPITAL_BASED_OUTPATIENT_CLINIC_OR_DEPARTMENT_OTHER): Payer: BC Managed Care – PPO | Admitting: Gynecologic Oncology

## 2021-08-10 ENCOUNTER — Encounter: Payer: Self-pay | Admitting: Hematology and Oncology

## 2021-08-10 VITALS — BP 123/73 | HR 91 | Temp 98.8°F | Resp 16 | Ht 67.0 in | Wt 139.6 lb

## 2021-08-10 DIAGNOSIS — Z90710 Acquired absence of both cervix and uterus: Secondary | ICD-10-CM

## 2021-08-10 DIAGNOSIS — C55 Malignant neoplasm of uterus, part unspecified: Secondary | ICD-10-CM

## 2021-08-10 DIAGNOSIS — C775 Secondary and unspecified malignant neoplasm of intrapelvic lymph nodes: Secondary | ICD-10-CM | POA: Diagnosis present

## 2021-08-10 DIAGNOSIS — Z90722 Acquired absence of ovaries, bilateral: Secondary | ICD-10-CM

## 2021-08-10 MED ORDER — IOHEXOL 350 MG/ML SOLN
60.0000 mL | Freq: Once | INTRAVENOUS | Status: AC | PRN
  Administered 2021-08-10: 60 mL via INTRAVENOUS

## 2021-08-10 MED ORDER — IOHEXOL 300 MG/ML  SOLN: 60.0000 mL | Freq: Once | INTRAMUSCULAR | Status: DC | PRN

## 2021-08-10 NOTE — Progress Notes (Signed)
[facility] NOTE  Patient Care Team: Curlene Labrum, MD as PCP - General (Family Medicine)  ASSESSMENT & PLAN:  Uterine leiomyosarcoma [facility]) Her case was recently discussed at the GYN oncology tumor board We have reached consensus that she would benefit from adjuvant treatment We have decided the patient should receive adjuvant radiation therapy first  Once she has completed radiation treatment, I plan another CT imaging to be done at baseline within 6 to 8 weeks of completion of treatment and then we will start her on adjuvant chemotherapy We discussed the risk, benefits, side effects of Adriamycin single agent treatment for 6 cycles She would need port placement and chemo education class along with pretreatment echocardiogram before we start treatment I plan to see her back at the end of her radiation treatment to plan the next step I have addressed all her questions and concerns  No orders of the defined types were placed in this encounter.   The total time spent in the appointment was 60 minutes encounter with patients including review of chart and various tests results, discussions about plan of care and coordination of care plan   All questions were answered. The patient knows to call the clinic with any problems, questions or concerns. No barriers to learning was detected.  [name], MD 10/31/20223:44 PM  CHIEF COMPLAINTS/PURPOSE OF CONSULTATION:  Leiomyosarcoma  HISTORY OF PRESENTING ILLNESS:  Tammie Gilmore 55 y.o. female is here because of recent diagnosis of leiomyosarcoma.  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 Summary of oncologic history is as follows: Oncology History  Uterine leiomyosarcoma ([place])  06/11/2021 Imaging   1. 9.5 x 7.6 x 9.0 cm complex, partially necrotic, mass involving the lower uterine segment/ cervix. No obvious direct extension into the parametrium.  2. 9 mm left pelvic sidewall lymph node is partially necrotic and worrisome for metastatic adenopathy.  3. No findings for abdominal omental or peritoneal surface disease or adenopathy.  4. Tiny low-attenuation lesion in the pancreatic head, likely benign cyst but attention on follow-up scans is suggested.  5. 2.9 cm fundal fibroid.    06/19/2021 Pathology Results   FINAL MICROSCOPIC DIAGNOSIS:   A. UTERINE, CERVICAL MASS, BIOPSY:  - Spindle cell malignancy.  - See comment.   COMMENT:  The biopsies consist of endocervical mucosa with stromal edema and one biopsy fragment has a microscopic focus with atypical spindle cells consistent with poorly differentiated malignancy.  The differential  includes a spindle cell malignancy such as sarcomatoid carcinoma and leiomyosarcoma.  Mullerian adenosarcoma is also a consideration but considered less likely   06/23/2021 Imaging   MR pelvis  10 cm uterine mass with central necrosis, which is centered in the cervix and lower uterine segment. Right parametrial involvement is seen as well as suspected invasion of the distal rectum. Differential diagnosis includes cervical carcinoma and uterine leiomyosarcoma.   Mild bilateral iliac lymphadenopathy, highly suspicious for metastatic disease.   2.9 cm subserosal fibroid in the posterior fundus.   Normal appearance of both ovaries.     06/29/2021 PET scan  1. Hypermetabolic necrotic cervical/uterine mass with bilateral external iliac hypermetabolic lymph nodes. No evidence of distant metastatic disease. 2. 1.5 cm  low-attenuation left thyroid nodule. Recommend thyroid ultrasound. (Ref: J Am Coll Radiol. 2015 Feb;12(2): 143-50).   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, pleomorphic, and myxoid areas and associated necrosis (~20%) - Tumor based in cervix and also involves lower uterine segment - Cervicovaginal margin involved by focal invasive leiomyosarcoma (3:00-5:00, A10) as well as tumor in lymphovascular spaces - Leiomyosarcoma involves right and left parametrial tissue and extends to parametrial margins - Extensive lymphovascular space invasion present, including in uterus and parametria - See synoptic report and comment   Other findings: - Leiomyomata with hyalinization, size up to 3.0 cm - Ovaries and fallopian tubes with no parenchymal involvement by leiomyosarcoma identified, although adnexal lymphovascular space invasion is present   B: Lymph nodes, right pelvic, lymphadenectomy - One of four lymph nodes positive for metastatic leiomyosarcoma (1/4), with extracapsular extension present   C: Lymph nodes, left pelvic, lymphadenectomy - One of four lymph nodes positive for metastatic leiomyosarcoma (1/4), with extracapsular extension present  Immunohistochemical stains are performed on block A11, and demonstrate that the tumor is positive for desmin and CD10, with SMA staining the majority of the spindle cell component but largely negative in the epithelioid / pleomorphic component. OSCAR, pancytokeratin AE1/AE3, HMB45, and PR appear negative in the tumor. ER shows patchy weak staining and myogenin stains rare cells. Block A23 also shows positive desmin and negative OSCAR pancytokeratin. Overall, the findings are most consistent with leiomyosarcoma, with extensive areas that are myxoid, epithelioid, and pleomorphic as  well as more typical spindle cell areas within the overall high grade tumor (grade 3 / 3). The tumor is staged as pT2b (involves other pelvic tissues) given the parametrial involvement and pN1 for FIGO stage IIIC.    07/17/2021 Surgery   Date of Surgery: 07/17/21  Preoperative Diagnosis: High Grade Uterine Sarcoma  Postoperative Diagnosis: Same  Procedure(s): Bilateral - RADICAL ABDOMINAL HYSTER, W/BIL TOTAL PELVIC LYMPHADENECTOMY & PARA-AORTIC LYMPH NODE BX W/WO REM TUBE/OVAR VAGINAL HYSTERECTOMY, FOR UTERUS 250 G OR LESS; WITH REPAIR OF ENTEROCELE COLECTOMY, PARTIAL; WITH COLOPROCTOSTOMY (LOW PELVIC ANASTOMOSIS) WITH COLOSTOMY CYSTOURETHROSCOPY, WITH INSERTION OF INDWELLING URETERAL STENT (EG, GIBBONS OR DOUBLE-J TYPE) - Cystourethroscopy - Bilateral ureteral stent placement - Foley catheter placement  Performing Service: Gynecology Oncology Surgeon(s) and Role: Panel 1: * Lafonda Mosses, MD - Primary * Bernadene Bell, MD - Resident - Assisting * Devonne Doughty, MD - Resident - Assisting Panel 2: * Franchot Erichsen, MD - Primary  Drains:  - Left 6Fr open-ended ureteral access catheter (green) - Right 5Fr open-ended ureteral access catheter (white) - 16Fr foley catheter to drainage  * No implants in log *  Indications: 55 y.o. female with high grade uterine sarcoma. Urology was consulted pre-operatively for placement of bilateral ureteral stents. Risks, benefits, and alternatives of the above procedure were discussed and informed consent was signed.  OperativeFindings:  - Grossly distorted architecture of urinary bladder likely 2/2 pelvic mass with anterolaterally positioned UOs - Successful placement of bilateral open-ended ureteral catheters under direct visualization - Foley catheter placed at case conclusion  Description: The patient was correctly identified in the preop holding area where written informed consent as well potential risk and complication  reviewed. She agreed. The patient was  brought to the operative suite where a preinduction timeout was performed. Once correct information was verified, general anesthesia was induced. The patient was then gently placed into dorsal lithotomy position with SCDs in place for VTE prophylaxis. They were prepped and draped in the usual sterile fashion and given appropriate preoperative antibiotics. A second timeout was then performed.   We inserted a 79F rigid cystoscope per urethra with copious lubrication and normal saline irrigation running. We performed cystourethroscopy, which revealed the above findings.  We turned our attention to the left ureteral orifice and canulated it with a sensor wire, using assistance of a 6Fr open-ended catheter. The wire was advanced into the renal pelvis without difficulty under visual guidance. We then advanced the stent over our wire into the renal pelvis under direct visualization and feel without complication. The wire was subsequently removed.   We then turned our attention to the right ureteral orifice and canulated it with a sensor wire, using assistance of a 5Fr open-ended catheter. The wire was advanced into the renal pelvis without difficulty under visual guidance. We then advanced the stent over our wire into the renal pelvis under direct visualization and feel without complication. The wire was subsequently removed.   A 16Fr straight catheter was placed, with return of urine indicating appropriate position within the bladder. The balloon was inflated with 10cc sterile water. The stents were secured to the Foley using 0-silk ties, being careful not to occlude the stents or Foley.   The patient was awoken from general anesthesia having tolerated the procedure well and taken to the PACU for routine post-operative recovery.  Post-Op Plan:  - Foley and stents per primary team    07/17/2021 Surgery   Date of Surgery: 07/17/2021  Pre-op Diagnosis: Uterine spindle  cell malignancy  Post-op Diagnosis: Same  Procedure(s): Panel 1 RADICAL ABDOMINAL HYSTER, with bilateral S&O, vagineconty upper, bilateral pelvic lyphadenectomy, bilateral ureterolysis,: 16109 (CPT) Panel 2 CYSTOURETHROSCOPY, WITH INSERTION OF INDWELLING URETERAL STENT (EG, GIBBONS OR DOUBLE-J TYPE): 60454 (CPT) Note: Revisions to procedures should be made in chart - see Procedures activity.  Performing Service: Gynecology Oncology Surgeon(s) and Role: Panel 1: * Lafonda Mosses, MD - Primary * Bernadene Bell, MD - Resident - Assisting * Devonne Doughty, MD - Resident - Assisting Panel 2: * Franchot Erichsen, MD - Primary  Findings: On bimanual exam, 10cm necrotic mass filling upper vagina, unable to discretely palpate the cervix. On rectovaginal exam, rectal involvement not identified. Intraoperatively, normal upper abdominal survey including normal liver, diaphragm, stomach, omentum and bowel. Small uterus with 10cm mass expanding the cervix. Palpably enlarged bilateral pelvic lymph nodes adherent to the external iliac veins and obturator nerves, removed. No palpable para-aortic lymphadenopathy. No rectal involvement of uterine mass.   Specimens:  ID Type Source Tests Collected by Time Destination  1 : uterus,cervix,bilateral tubes/ovaries Tissue Uterus SURGICAL PATHOLOGY EXAM Lafonda Mosses, MD 07/17/2021 0932  2 : right pelvic lymph node Tissue Lymph Node SURGICAL PATHOLOGY EXAM Lafonda Mosses, MD 07/17/2021 1125  3 : LEFT PELVIC LN Tissue Lymph Node SURGICAL PATHOLOGY EXAM Lafonda Mosses, MD 07/17/2021 1144    08/06/2021 Initial Diagnosis   Uterine leiomyosarcoma ([place])   08/06/2021 Cancer Staging   Staging form: Corpus Uteri - Leiomyosarcoma and Endometrial Stromal Sarcoma, AJCC 8th Edition - Pathologic stage from 08/06/2021: Stage IIIC (pT3, pN1, cM0) - Signed by [name], MD on 08/06/2021 Stage prefix: Initial diagnosis      MEDICAL HISTORY:  Past Medical History:  Diagnosis Date   Hypoglycemia    occasional episodes of hypoglycemia   IBS (irritable bowel syndrome)     SURGICAL HISTORY: Past Surgical History:  Procedure Laterality Date   HERNIA REPAIR  1994   left inguinal, with mesh    SOCIAL HISTORY: Social History   Socioeconomic History   Marital status: Married    Spouse name: Not on file   Number of children: 3   Years of education: Not on file   Highest education level: Not on file  Occupational History   Occupation: accountant  Tobacco Use   Smoking status: Never   Smokeless tobacco: Never  Vaping Use   Vaping Use: Never used  Substance and Sexual Activity   Alcohol use: Never   Drug use: Never   Sexual activity: Not Currently  Other Topics Concern   Not on file  Social History Narrative   Not on file   Social Determinants of Health   Financial Resource Strain: Not on file  Food Insecurity: Not on file  Transportation Needs: Not on file  Physical Activity: Not on file  Stress: Not on file  Social Connections: Not on file  Intimate Partner Violence: Not on file    FAMILY HISTORY: Family History  Problem Relation Age of Onset   Heart attack Mother    Heart disease Mother    Endometriosis Mother    Heart disease Father    Heart attack Father    Cancer - Colon Neg Hx    Breast cancer Neg Hx    Cancer Neg Hx    Ovarian cancer Neg Hx    Uterine cancer Neg Hx    Pancreatic cancer Neg Hx    Pancreatic disease Neg Hx    Prostate cancer Neg Hx     ALLERGIES:  is allergic to doxycycline.  MEDICATIONS:  Current Outpatient Medications  Medication Sig Dispense Refill   acetaminophen (TYLENOL) 325 MG tablet Take by mouth.     senna (SENOKOT) 8.6 MG tablet Take 2 tablets by mouth at bedtime.     No current facility-administered medications for this visit.    REVIEW OF SYSTEMS:   Constitutional: Denies fevers, chills or abnormal night sweats Eyes: Denies blurriness of vision,  double vision or watery eyes Ears, nose, mouth, throat, and face: Denies mucositis or sore throat Respiratory: Denies cough, dyspnea or wheezes Cardiovascular: Denies palpitation, chest discomfort or lower extremity swelling Gastrointestinal:  Denies nausea, heartburn or change in bowel habits Skin: Denies abnormal skin rashes Lymphatics: Denies new lymphadenopathy or easy bruising Neurological:Denies numbness, tingling or new weaknesses Behavioral/Psych: Mood is stable, no new changes  All other systems were reviewed with the patient and are negative.  PHYSICAL EXAMINATION: ECOG PERFORMANCE STATUS: 1 - Symptomatic but completely ambulatory  Vitals:   08/10/21 1500  BP: 123/73  Pulse: 91  Resp: 16  Temp: 98.8 F (37.1 C)  SpO2: 100%   Filed Weights   08/10/21 1500  Weight: 139 lb (63 kg)    GENERAL:alert, no distress and comfortable SKIN: skin color, texture, turgor are normal, no rashes or significant lesions EYES: normal, conjunctiva are pink and non-injected, sclera clear OROPHARYNX:no exudate, no erythema and lips, buccal mucosa, and tongue normal  NECK: supple, thyroid normal size, non-tender, without nodularity LYMPH:  no palpable lymphadenopathy in the cervical, axillary or inguinal LUNGS: clear to auscultation and percussion with normal breathing effort HEART: regular rate & rhythm and no murmurs and no lower extremity edema ABDOMEN:abdomen  soft, non-tender and normal bowel sounds.  Noted well-healed surgical scar Musculoskeletal:no cyanosis of digits and no clubbing  PSYCH: alert & oriented x 3 with fluent speech NEURO: no focal motor/sensory deficits  LABORATORY DATA:  I have reviewed the data as listed Lab Results  Component Value Date   WBC 9.5 06/26/2021   HGB 12.8 06/26/2021   HCT 38.4 06/26/2021   MCV 87.7 06/26/2021   PLT 347 06/26/2021   No results for input(s): NA, K, CL, CO2, GLUCOSE, BUN, CREATININE, CALCIUM, GFRNONAA, GFRAA, PROT, ALBUMIN, AST,  ALT, ALKPHOS, BILITOT, BILIDIR, IBILI in the last 8760 hours.  RADIOGRAPHIC STUDIES: I have reviewed CT imaging today along with her prior PET CT imaging I have personally reviewed the radiological images as listed and agreed with the findings in the report.

## 2021-08-10 NOTE — Assessment & Plan Note (Signed)
Her case was recently discussed at the GYN oncology tumor board We have reached consensus that she would benefit from adjuvant treatment We have decided the patient should receive adjuvant radiation therapy first  Once she has completed radiation treatment, I plan another CT imaging to be done at baseline within 6 to 8 weeks of completion of treatment and then we will start her on adjuvant chemotherapy We discussed the risk, benefits, side effects of Adriamycin single agent treatment for 6 cycles She would need port placement and chemo education class along with pretreatment echocardiogram before we start treatment I plan to see her back at the end of her radiation treatment to plan the next step I have addressed all her questions and concerns

## 2021-08-10 NOTE — Patient Instructions (Signed)
It was great to see you today!  You are healing very well from surgery.  Remember no lifting more than 10 pounds until 6 weeks after surgery and nothing in the vagina until at least 8 weeks after surgery.  I will call you once I have the report from the radiologist from your CT scan today.

## 2021-08-10 NOTE — Progress Notes (Signed)
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 9.5 x 7.6 x 9.0 cm complex, partially necrotic, mass involving the lower uterine segment/ cervix. No obvious direct extension into the parametrium.  2. 9 mm left pelvic sidewall lymph node is partially necrotic and worrisome for metastatic adenopathy.  3. No findings for abdominal omental or peritoneal surface disease or adenopathy.  4. Tiny low-attenuation lesion in the pancreatic head, likely benign cyst but attention on follow-up scans is suggested.  5. 2.9 cm fundal fibroid.    06/19/2021 Pathology Results   FINAL MICROSCOPIC DIAGNOSIS:   A. UTERINE, CERVICAL MASS, BIOPSY:  - Spindle cell malignancy.  - See comment.   COMMENT:  The biopsies consist of endocervical mucosa with stromal edema and one biopsy fragment has a microscopic focus with atypical spindle cells consistent with poorly differentiated malignancy.  The differential  includes a spindle cell malignancy such as sarcomatoid carcinoma and leiomyosarcoma.  Mullerian adenosarcoma is also a consideration but considered less likely   06/23/2021 Imaging   MR pelvis  10 cm uterine mass with central necrosis, which is centered in the cervix and lower uterine segment. Right parametrial involvement is seen as well as suspected invasion of the distal rectum. Differential diagnosis includes cervical carcinoma and uterine leiomyosarcoma.   Mild bilateral iliac lymphadenopathy, highly suspicious for metastatic disease.   2.9 cm subserosal fibroid in the posterior fundus.   Normal appearance of both ovaries.     06/29/2021 PET scan   1. Hypermetabolic necrotic cervical/uterine mass with bilateral external iliac hypermetabolic lymph nodes. No evidence of distant metastatic disease. 2. 1.5 cm low-attenuation left thyroid  nodule. Recommend thyroid ultrasound. (Ref: J Am Coll Radiol. 2015 Feb;12(2): 143-50).   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, pleomorphic, and myxoid areas and associated necrosis (~20%) - Tumor based in cervix and also involves lower uterine segment - Cervicovaginal margin involved by focal invasive leiomyosarcoma (3:00-5:00, A10) as well as tumor in lymphovascular spaces - Leiomyosarcoma involves right and left parametrial tissue and extends to parametrial margins - Extensive lymphovascular space invasion present, including in uterus and parametria - See synoptic report and comment   Other findings: - Leiomyomata with hyalinization, size up to 3.0 cm - Ovaries and fallopian tubes with no parenchymal involvement by leiomyosarcoma identified, although adnexal lymphovascular space invasion is present   B: Lymph nodes, right pelvic, lymphadenectomy - One of four lymph nodes positive for metastatic leiomyosarcoma (1/4), with extracapsular extension present   C: Lymph nodes, left pelvic, lymphadenectomy - One of four lymph nodes positive for metastatic leiomyosarcoma (1/4), with extracapsular extension present  Immunohistochemical stains are performed on block A11, and demonstrate that the tumor is positive for desmin and CD10, with SMA staining the majority of the spindle cell component but largely negative in the epithelioid / pleomorphic component. OSCAR, pancytokeratin AE1/AE3, HMB45, and PR appear negative in the tumor. ER shows patchy weak staining and myogenin stains rare cells. Block A23 also shows positive desmin and negative OSCAR pancytokeratin. Overall, the findings are most consistent with leiomyosarcoma, with extensive areas that are myxoid, epithelioid, and pleomorphic as well as more typical spindle  cell areas within the overall high grade tumor (grade 3 / 3). The tumor is staged  as pT2b (involves other pelvic tissues) given the parametrial involvement and pN1 for FIGO stage IIIC.    07/17/2021 Surgery   Date of Surgery: 07/17/21  Preoperative Diagnosis: High Grade Uterine Sarcoma  Postoperative Diagnosis: Same  Procedure(s): Bilateral - RADICAL ABDOMINAL HYSTER, W/BIL TOTAL PELVIC LYMPHADENECTOMY & PARA-AORTIC LYMPH NODE BX W/WO REM TUBE/OVAR VAGINAL HYSTERECTOMY, FOR UTERUS 250 G OR LESS; WITH REPAIR OF ENTEROCELE COLECTOMY, PARTIAL; WITH COLOPROCTOSTOMY (LOW PELVIC ANASTOMOSIS) WITH COLOSTOMY CYSTOURETHROSCOPY, WITH INSERTION OF INDWELLING URETERAL STENT (EG, GIBBONS OR DOUBLE-J TYPE) - Cystourethroscopy - Bilateral ureteral stent placement - Foley catheter placement  Performing Service: Gynecology Oncology Surgeon(s) and Role: Panel 1: * Lafonda Mosses, MD - Primary * Bernadene Bell, MD - Resident - Assisting * Devonne Doughty, MD - Resident - Assisting Panel 2: * Franchot Erichsen, MD - Primary  Drains:  - Left 6Fr open-ended ureteral access catheter (green) - Right 5Fr open-ended ureteral access catheter (white) - 16Fr foley catheter to drainage  * No implants in log *  Indications: 55 y.o. female with high grade uterine sarcoma. Urology was consulted pre-operatively for placement of bilateral ureteral stents. Risks, benefits, and alternatives of the above procedure were discussed and informed consent was signed.  OperativeFindings:  - Grossly distorted architecture of urinary bladder likely 2/2 pelvic mass with anterolaterally positioned UOs - Successful placement of bilateral open-ended ureteral catheters under direct visualization - Foley catheter placed at case conclusion  Description: The patient was correctly identified in the preop holding area where written informed consent as well potential risk and complication reviewed. She agreed. The patient  was brought to the operative suite where a preinduction timeout was performed. Once correct information was verified, general anesthesia was induced. The patient was then gently placed into dorsal lithotomy position with SCDs in place for VTE prophylaxis. They were prepped and draped in the usual sterile fashion and given appropriate preoperative antibiotics. A second timeout was then performed.   We inserted a 57F rigid cystoscope per urethra with copious lubrication and normal saline irrigation running. We performed cystourethroscopy, which revealed the above findings.  We turned our attention to the left ureteral orifice and canulated it with a sensor wire, using assistance of a 6Fr open-ended catheter. The wire was advanced into the renal pelvis without difficulty under visual guidance. We then advanced the stent over our wire into the renal pelvis under direct visualization and feel without complication. The wire was subsequently removed.   We then turned our attention to the right ureteral orifice and canulated it with a sensor wire, using assistance of a 5Fr open-ended catheter. The wire was advanced into the renal pelvis without difficulty under visual guidance. We then advanced the stent over our wire into the renal pelvis under direct visualization and feel without complication. The wire was subsequently removed.   A 16Fr straight catheter was placed, with return of urine indicating appropriate position within the bladder. The balloon was inflated with 10cc sterile water. The stents were secured to the Foley using 0-silk ties, being careful not to occlude the stents or Foley.   The patient was awoken from general anesthesia having tolerated the procedure well and taken to the PACU for routine post-operative recovery.  Post-Op Plan:  - Foley and stents per primary team    07/17/2021 Surgery   Date of Surgery: 07/17/2021  Pre-op Diagnosis: Uterine spindle cell malignancy  Post-op Diagnosis:  Same  Procedure(s): Panel 1 RADICAL ABDOMINAL HYSTER, with bilateral S&O, vagineconty upper, bilateral  pelvic lyphadenectomy, bilateral ureterolysis,: 49675 (CPT) Panel 2 CYSTOURETHROSCOPY, WITH INSERTION OF INDWELLING URETERAL STENT (EG, GIBBONS OR DOUBLE-J TYPE): 91638 (CPT) Note: Revisions to procedures should be made in chart - see Procedures activity.  Performing Service: Gynecology Oncology Surgeon(s) and Role: Panel 1: * Lafonda Mosses, MD - Primary * Bernadene Bell, MD - Resident - Assisting * Devonne Doughty, MD - Resident - Assisting Panel 2: * Franchot Erichsen, MD - Primary  Findings: On bimanual exam, 10cm necrotic mass filling upper vagina, unable to discretely palpate the cervix. On rectovaginal exam, rectal involvement not identified. Intraoperatively, normal upper abdominal survey including normal liver, diaphragm, stomach, omentum and bowel. Small uterus with 10cm mass expanding the cervix. Palpably enlarged bilateral pelvic lymph nodes adherent to the external iliac veins and obturator nerves, removed. No palpable para-aortic lymphadenopathy. No rectal involvement of uterine mass.   Specimens:  ID Type Source Tests Collected by Time Destination  1 : uterus,cervix,bilateral tubes/ovaries Tissue Uterus SURGICAL PATHOLOGY EXAM Lafonda Mosses, MD 07/17/2021 0932  2 : right pelvic lymph node Tissue Lymph Node SURGICAL PATHOLOGY EXAM Lafonda Mosses, MD 07/17/2021 1125  3 : LEFT PELVIC LN Tissue Lymph Node SURGICAL PATHOLOGY EXAM Lafonda Mosses, MD 07/17/2021 1144    08/06/2021 Initial Diagnosis   Uterine leiomyosarcoma ([facility])   08/06/2021 Cancer Staging   Staging form: Corpus Uteri - Leiomyosarcoma and Endometrial Stromal Sarcoma, AJCC 8th Edition - Pathologic stage from 08/06/2021: Stage IIIC (pT3, pN1, cM0) - Signed by [name], MD on 08/06/2021 Stage prefix: Initial diagnosis      Interval History: Presents today for follow-up after  surgery.  Notes overall doing well.  Feels like she turned a corner 2 or 3 days ago and has had significant improvement in her energy.  Has mild pain or twinges from time to time, denies any significant abdominal or pelvic pain.  Reports feeling a "Gilmore" when she starts her urine stream, denies any dysuria or other urinary symptoms.  Still struggling with some constipation intermittently for which she takes Senokot as needed.  Appetite has improved, denies any nausea or emesis.  Denies any fevers or chills.  Past Medical/Surgical History: Past Medical History:  Diagnosis Date   Hypoglycemia    occasional episodes of hypoglycemia   IBS (irritable bowel syndrome)     Past Surgical History:  Procedure Laterality Date   HERNIA REPAIR  1994   left inguinal, with mesh    Family History  Problem Relation Age of Onset   Heart attack Mother    Heart disease Mother    Endometriosis Mother    Heart disease Father    Heart attack Father    Cancer - Colon Neg Hx    Breast cancer Neg Hx    Cancer Neg Hx    Ovarian cancer Neg Hx    Uterine cancer Neg Hx    Pancreatic cancer Neg Hx    Pancreatic disease Neg Hx    Prostate cancer Neg Hx     Social History   Socioeconomic History   Marital status: Married    Spouse name: Not on file   Number of children: 3   Years of education: Not on file   Highest education level: Not on file  Occupational History   Occupation: accountant  Tobacco Use   Smoking status: Never   Smokeless tobacco: Never  Vaping Use   Vaping Use: Never used  Substance and Sexual Activity   Alcohol use: Never  Drug use: Never   Sexual activity: Not Currently  Other Topics Concern   Not on file  Social History Narrative   Not on file   Social Determinants of Health   Financial Resource Strain: Not on file  Food Insecurity: Not on file  Transportation Needs: Not on file  Physical Activity: Not on file  Stress: Not on file  Social Connections: Not on file     Current Medications:  Current Outpatient Medications:    acetaminophen (TYLENOL) 325 MG tablet, Take by mouth., Disp: , Rfl:    senna (SENOKOT) 8.6 MG tablet, Take 2 tablets by mouth at bedtime., Disp: , Rfl:   Review of Systems: Denies appetite changes, fevers, chills, fatigue, unexplained weight changes. Denies hearing loss, neck lumps or masses, mouth sores, ringing in ears or voice changes. Denies cough or wheezing.  Denies shortness of breath. Denies chest pain or palpitations. Denies leg swelling. Denies abdominal distention, pain, blood in stools, constipation, diarrhea, nausea, vomiting, or early satiety. Denies pain with intercourse, dysuria, frequency, hematuria or incontinence. Denies hot flashes, pelvic pain, vaginal bleeding or vaginal discharge.   Denies joint pain, back pain or muscle pain/cramps. Denies itching, rash, or wounds. Denies dizziness, headaches, numbness or seizures. Denies swollen lymph nodes or glands, denies easy bruising or bleeding. Denies anxiety, depression, confusion, or decreased concentration.  Physical Exam: BP 123/73 (BP Location: Left Arm, Patient Position: Sitting)   Pulse 91   Temp 98.8 F (37.1 C) (Oral)   Resp 16   Ht 5\' 7"  (1.702 m)   Wt 139 lb 9.6 oz (63.3 kg)   SpO2 100%   BMI 21.86 kg/m  General: Alert, oriented, no acute distress. HEENT: Normocephalic, atraumatic, sclera anicteric. Chest: Unlabored breathing on room air. Abdomen: soft, nontender.  Normoactive bowel sounds.  No masses or hepatosplenomegaly appreciated.  Mildly distended in lower abdomen.  Well-healed incision, Dermabond removed.  Incision is clean, dry, intact.  No erythema or induration. Extremities: Grossly normal range of motion.  Warm, well perfused.  No edema bilaterally. Skin: No rashes or lesions noted. GU: Normal appearing external genitalia without erythema, excoriation, or lesions.  Speculum exam reveals vaginal cuff is intact with sutures visible.   No bleeding or discharge.  Bimanual exam reveals cuff intact, no fluctuance or tenderness with palpation.    Laboratory & Radiologic Studies: None new  CT abdomen/pelvis pending from today  Assessment & Plan: Tammie Gilmore is a 55 y.o. woman with Stage IIIC LMS who presents for follow-up after surgery and coordination of care.  Patient is doing very well from a postoperative standpoint and meeting all milestones.  We discussed continued expectations as well as postoperative limitations.  Her CT scan has been performed today but not officially reviewed by radiology.  I am not seeing significantly enlarged lymph nodes in her pelvis, and surgical bed appears to be healing well.  Patient is seen Dr. Alvy Bimler today and is seeing Dr. Sondra Come on Wednesday.  After discussion at tumor board, plan will be for pelvic radiation first followed by chemotherapy.  28 minutes of total time was spent for this patient encounter, including preparation, face-to-face counseling with the patient and coordination of care, and documentation of the encounter.  Tammie Pinch, MD  Division of Gynecologic Oncology  Department of Obstetrics and Gynecology  [facility]

## 2021-08-11 ENCOUNTER — Telehealth: Payer: Self-pay | Admitting: Oncology

## 2021-08-11 ENCOUNTER — Telehealth: Payer: Self-pay | Admitting: Gynecologic Oncology

## 2021-08-11 ENCOUNTER — Other Ambulatory Visit: Payer: Self-pay | Admitting: Hematology and Oncology

## 2021-08-11 DIAGNOSIS — C7801 Secondary malignant neoplasm of right lung: Secondary | ICD-10-CM

## 2021-08-11 DIAGNOSIS — C55 Malignant neoplasm of uterus, part unspecified: Secondary | ICD-10-CM

## 2021-08-11 DIAGNOSIS — Z5111 Encounter for antineoplastic chemotherapy: Secondary | ICD-10-CM

## 2021-08-11 DIAGNOSIS — C7800 Secondary malignant neoplasm of unspecified lung: Secondary | ICD-10-CM | POA: Insufficient documentation

## 2021-08-11 NOTE — Telephone Encounter (Signed)
Called patient with CT results. She is understandably disappointed. Will plan to get dedicated chest CT. Spoke with Dr. Alvy Bimler - given metastatic disease, will plan to start with systemic treatment.  Jeral Pinch MD Gynecologic Oncology

## 2021-08-11 NOTE — Telephone Encounter (Signed)
Called Tammie Gilmore and went over upcoming appointments for port, echo, CT scan and with Tammie Gilmore.  She verbalized understanding of all appointments and instructions.  She did ask for the CT chest to be rescheduled.  Advised her that I will call her tomorrow with the new CT appointment.

## 2021-08-11 NOTE — Progress Notes (Incomplete)
Radiation Oncology         [phone] ________________________________  Initial Outpatient Consultation  Name: Tammie Gilmore MRN: [id number]  Date: 08/12/2021  DOB: December 30, 1965  FT:[id number], [name], MD  Lafonda Mosses, MD   REFERRING PHYSICIAN: Lafonda Mosses, MD  DIAGNOSIS: There were no encounter diagnoses.  Cancer Staging Uterine leiomyosarcoma [facility]) Staging form: Corpus Uteri - Leiomyosarcoma and Endometrial Stromal Sarcoma, AJCC 8th Edition - Pathologic stage from 08/06/2021: FIGO Stage IVB (pT3, pN1, cM1) - Signed by [name], MD on 08/11/2021 Stage prefix: Initial diagnosis   HISTORY OF PRESENT ILLNESS::[name] is a 55 y.o. female who is accompanied by ***. she is seen as a courtesy of Dr. Berline Lopes for an opinion concerning radiation therapy as part of management for her recently diagnosed uterine leiomyosarcoma . The patient presented to [facility] ([place] at [facility] at [place] on 06/04/21 with a recent episode of postmenopausal bleeding lasting 2 weeks in duration. Pelvic ultrasound performed on 06/09/21 demonstrated a large leiomyoma with shadowing at the mid to lower uterus, measuring 7.7 x 6.6 x 7.4 cm. Endometrial complex was poorly visualized at the lower to mid uterus on [place], though was noted to likely measure 5 mm in thickness.   CT of the abdomen and pelvis on 06/13/21 further revealed a 9.5 x 7.6 x 9.0 cm complex, partially necrotic, mass involving the lower uterine segment/ cervix, with no obvious direct extension into the parametrium. CT also showed a 9 mm partially necrotic left pelvic sidewall lymph node, worrisome for metastatic adenopathy. No findings were seen concerning for abdominal omental or peritoneal surface disease or adenopathy. (Incidental findings included a tiny, low-attenuation lesion in the pancreatic head, noted as likely a benign cyst, and a 2.9 cm fundal fibroid).   The patient was then referred to Dr. Berline Lopes on  06/19/21  for further evaluation of the uterine mass. Pelvic exam performed revealed the patient's cervix as dilated, enough so that on speculum exam, the posterior cervix could not be appreciated. The known mass was seen to to prolapse up to (but not through) the external cervical OS, filling the vagina. Mass was noted to appear necrotic versus degenerating and mostly smooth, but with some areas that almost appeared cystic. On bimanual exam, the cervix was dilated approximately 8 cm, with normal cervix around the entire mass. A couple areas with some nodularity along the mass were noted though most of it is smooth. Uterus was noted to measure approximately 10-12 cm, cervix and lower uterine segment were also noted as quite bulbous, and minimally mobile. Notable nodularity or parametrial involvement was also appreciated about the uterus.   Biopsy performed by Dr. Berline Lopes on 06/19/21 of the cervical mass revealed spindle cell malignancy. Differential diagnoses noted on pathology included spindle cell malignancy such as sarcomatoid carcinoma and leiomyosarcoma. Mullerian adenosarcoma was also noted as a consideration but considered less likely.  Further workup ordered by Dr. Berline Lopes consisted of pelvic MRI on 06/22/21 which demonstrated the uterine mass with central necrosis, measuring 10 cm, and centered int he cervix and lower uterine segment. Right parametrial involvement was seen as well, and suspected invasion of the distal rectum. Differential Dx's were again noted to include cervical carcinoma and uterine leiomyosarcoma. MRI also showed bilateral iliac lymphadenopathy, noted as highly suspicious for metastatic disease.   PET on 06/29/21 demonstrated the hypermetabolic necrotic cervical/uterine mass with bilateral external iliac hypermetabolic lymph nodes. No evidence of distant metastatic disease was seen. A 1.5 cm  low-attenuation left thyroid nodule was also appreciated.   The patient opted to proceed  with hysterectomy, with BSO and bilateral lymphadenectomies on 07/17/21 under the care of Dr. Berline Lopes. Pathology from the procedure revealed: grade 3 Leiomyosarcoma with extensive epithelioid, pleomorphic, and myxoid areas and associated necrosis (~20%). Tumor was seen to be based in the cervix, and involving the lower uterine segment. Margin status of: cervicovaginal margin involved by focal invasive leiomyosarcoma (3:00-5:00, A10) as well as tumor in lymphovascular spaces, with leiomyosarcoma involving the right and left parametrial tissue and extending to parametrial margins. Extensive lymphovascular space invasion was seen to be present as well, including the uterus and parametria. Right pelvic lymphadenectomy revealed 1/4 lymph nodes positive for metastatic leiomyosarcoma, with extracapsular extension present. Left pelvic lymphadenectomy also revealed 1/4 lymph nodes positive for metastatic leiomyosarcoma, with extracapsular extension present. No parenchymal involvement by leiomyosarcoma identified in the ovaries and fallopian tubes, although adnexal lymphovascular space invasion was present.  The patient's case was discussed at the Tumor Board held on 08/03/21.  CT of the abdomen and pelvis with contrast on 08/10/21 showed interval development of the left lobe pulmonary nodules, measuring up to 7 mm, noted as highly suspicious for metastatic disease. Interval development was also seen of the small to upper normal lymph nodes in the pelvis, also concerning for metastatic disease. CT incidentally showed a new tiny cluster of tree-in-bud opacity in the peripheral right lower lobe, compatible with sequelae of atypical infection.   The patient most recently followed up with Dr. Berline Lopes on 08/10/21 and was noted to be doing quite well overall other than very mild dysuria . Patient reported significant improvement in her energy and denied any significant abdominal or pelvic pain.   PREVIOUS RADIATION THERAPY:  No  PAST MEDICAL HISTORY:  Past Medical History:  Diagnosis Date   Hypoglycemia    occasional episodes of hypoglycemia   IBS (irritable bowel syndrome)     PAST SURGICAL HISTORY: Past Surgical History:  Procedure Laterality Date   HERNIA REPAIR  1994   left inguinal, with mesh    FAMILY HISTORY:  Family History  Problem Relation Age of Onset   Heart attack Mother    Heart disease Mother    Endometriosis Mother    Heart disease Father    Heart attack Father    Cancer - Colon Neg Hx    Breast cancer Neg Hx    Cancer Neg Hx    Ovarian cancer Neg Hx    Uterine cancer Neg Hx    Pancreatic cancer Neg Hx    Pancreatic disease Neg Hx    Prostate cancer Neg Hx     SOCIAL HISTORY:  Social History   Tobacco Use   Smoking status: Never   Smokeless tobacco: Never  Vaping Use   Vaping Use: Never used  Substance Use Topics   Alcohol use: Never   Drug use: Never    ALLERGIES:  Allergies  Allergen Reactions   Doxycycline     Dizziness, NAUSEA    MEDICATIONS:  Current Outpatient Medications  Medication Sig Dispense Refill   acetaminophen (TYLENOL) 325 MG tablet Take by mouth.     senna (SENOKOT) 8.6 MG tablet Take 2 tablets by mouth at bedtime.     No current facility-administered medications for this encounter.    REVIEW OF SYSTEMS:  A 10+ POINT REVIEW OF SYSTEMS WAS OBTAINED including neurology, dermatology, psychiatry, cardiac, respiratory, lymph, extremities, GI, GU, musculoskeletal, constitutional, reproductive, HEENT. ***   PHYSICAL EXAM:  vitals were not taken for this visit.   General: Alert and oriented, in no acute distress HEENT: Head is normocephalic. Extraocular movements are intact. Oropharynx is clear. Neck: Neck is supple, no palpable cervical or supraclavicular lymphadenopathy. Heart: Regular in rate and rhythm with no murmurs, rubs, or gallops. Chest: Clear to auscultation bilaterally, with no rhonchi, wheezes, or rales. Abdomen: Soft,  nontender, nondistended, with no rigidity or guarding. Extremities: No cyanosis or edema. Lymphatics: see Neck Exam Skin: No concerning lesions. Musculoskeletal: symmetric strength and muscle tone throughout. Neurologic: Cranial nerves II through XII are grossly intact. No obvious focalities. Speech is fluent. Coordination is intact. Psychiatric: Judgment and insight are intact. Affect is appropriate. ***  On pelvic examination the external genitalia were unremarkable. A speculum exam was performed. There are no mucosal lesions noted in the vaginal vault. A Pap smear was obtained of the proximal vagina. On bimanual and rectovaginal examination there were no pelvic masses appreciated. ***   ECOG = ***  0 - Asymptomatic (Fully active, able to carry on all predisease activities without restriction)  1 - Symptomatic but completely ambulatory (Restricted in physically strenuous activity but ambulatory and able to carry out work of a light or sedentary nature. For example, light housework, office work)  2 - Symptomatic, <50% in bed during the day (Ambulatory and capable of all self care but unable to carry out any work activities. Up and about more than 50% of waking hours)  3 - Symptomatic, >50% in bed, but not bedbound (Capable of only limited self-care, confined to bed or chair 50% or more of waking hours)  4 - Bedbound (Completely disabled. Cannot carry on any self-care. Totally confined to bed or chair)  5 - Death   Eustace Pen MM, Creech RH, Tormey DC, et al. [phone]). "Toxicity and response criteria of the [facility]". Irena Oncol. 5 (6): 649-55  LABORATORY DATA:  Lab Results  Component Value Date   WBC 9.5 06/26/2021   HGB 12.8 06/26/2021   HCT 38.4 06/26/2021   MCV 87.7 06/26/2021   PLT 347 06/26/2021   NEUTROABS 7.1 06/26/2021   No results found for: NA, K, CL, CO2, GLUCOSE, CREATININE, CALCIUM    RADIOGRAPHY: CT Abdomen Pelvis W Contrast  Result  Date: 08/11/2021 CLINICAL DATA:  Hysterectomy for leiomyosarcoma.  Restaging EXAM: CT ABDOMEN AND PELVIS WITH CONTRAST TECHNIQUE: Multidetector CT imaging of the abdomen and pelvis was performed using the standard protocol following bolus administration of intravenous contrast. CONTRAST:  32mL OMNIPAQUE IOHEXOL 350 MG/ML SOLN COMPARISON:  PET-CT 06/29/2021 FINDINGS: Lower chest: New 7 mm left lower lobe pulmonary nodule evident on 23/4. 4 mm left lower lobe nodule on 29/4 is also new in the interval since prior PET-CT. Tiny cluster of tree-in-bud opacity in the peripheral right lower lobe (16/4) is new and compatible with sequelae of atypical infection. No pleural effusion. Hepatobiliary: No suspicious focal abnormality within the liver parenchyma. Gallbladder is nondistended. No intrahepatic or extrahepatic biliary dilation. Pancreas: No focal mass lesion. No dilatation of the main duct. No intraparenchymal cyst. No peripancreatic edema. Spleen: No splenomegaly. No focal mass lesion. Adrenals/Urinary Tract: No adrenal nodule or mass. Kidneys unremarkable. No evidence for hydroureter. Bladder is nondistended. Stomach/Bowel: Stomach is unremarkable. No gastric wall thickening. No evidence of outlet obstruction. Duodenum is normally positioned as is the ligament of Treitz. No small bowel wall thickening. No small bowel dilatation. The terminal ileum is normal. The appendix is not well visualized, but there is no  edema or inflammation in the region of the cecum. No gross colonic mass. No colonic wall thickening. Vascular/Lymphatic: No abdominal aortic aneurysm. No abdominal lymphadenopathy. Postoperative seroma noted left pelvic sidewall. 13 mm short axis left pelvic sidewall lymph node visible on 73/2. Immediately adjacent 7 mm short axis node visible on 74/2. Postsurgical changes noted right pelvic sidewall without evidence for lymphadenopathy in the right pelvis. 7 mm right perirectal node on 80/2 is new since prior  as is a 8 mm short axis presacral node on 73/2. 8 mm perirectal node seen on the right on 77/2. Reproductive: Uterus surgically absent.  There is no adnexal mass. Other: Trace free fluid seen in the pelvis. Musculoskeletal: Stable tiny sclerotic foci in the left iliac bone, likely bone islands. Stable appearance sclerotic lesion right iliac bone. IMPRESSION: 1. Interval development of left lobe pulmonary nodules, measuring up to 7 mm and highly for metastatic disease. 2. Interval development of small to upper normal lymph nodes in the pelvis, concerning for metastatic disease. 3. Postoperative seroma left pelvic sidewall. 4. Tiny cluster of tree-in-bud opacity in the peripheral right lower lobe is new and compatible with sequelae of atypical infection. Electronically Signed   By: Misty Stanley M.D.   On: 08/11/2021 10:47      IMPRESSION:  Stage IIIC (pT3, pN1, cM0) Uterine leiomyosarcoma (HCC)  ***  Today, I talked to the patient and family about the findings and work-up thus far.  We discussed the natural history of *** and general treatment, highlighting the role of radiotherapy in the management.  We discussed the available radiation techniques, and focused on the details of logistics and delivery.  We reviewed the anticipated acute and late sequelae associated with radiation in this setting.  The patient was encouraged to ask questions that I answered to the best of my ability. *** A patient consent form was discussed and signed.  We retained a copy for our records.  The patient would like to proceed with radiation and will be scheduled for CT simulation.  PLAN: ***    *** minutes of total time was spent for this patient encounter, including preparation, face-to-face counseling with the patient and coordination of care, physical exam, and documentation of the encounter.   ------------------------------------------------  Blair Promise, PhD, MD  This document serves as a record of services  personally performed by Gery Pray, MD. It was created on his behalf by Roney Mans, a trained medical scribe. The creation of this record is based on the scribe's personal observations and the provider's statements to them. This document has been checked and approved by the attending provider.

## 2021-08-11 NOTE — Progress Notes (Signed)
START ON PATHWAY REGIMEN - Uterine     A cycle is every 21 days:     Doxorubicin   **Always confirm dose/schedule in your pharmacy ordering system**  Patient Characteristics: High Grade Undifferentiated/Leiomyosarcoma, Newly Diagnosed, Postoperative (Pathologic Staging), Incomplete Resection Histology: High Grade Undifferentiated/Leiomyosarcoma Therapeutic Status: Newly Diagnosed, Postoperative (Pathologic Staging) AJCC T Category: pT3 AJCC N Category: pN1 AJCC 8 Stage Grouping: IVB AJCC M Category: cM1 Resection Status: Incomplete Resection  Intent of Therapy: Non-Curative / Palliative Intent, Discussed with Patient

## 2021-08-12 ENCOUNTER — Other Ambulatory Visit: Payer: Self-pay | Admitting: Radiology

## 2021-08-12 ENCOUNTER — Telehealth: Payer: Self-pay | Admitting: Oncology

## 2021-08-12 ENCOUNTER — Ambulatory Visit
Admission: RE | Admit: 2021-08-12 | Discharge: 2021-08-12 | Disposition: A | Discharge status: Home / Self Care | Payer: BC Managed Care – PPO | Source: Ambulatory Visit | Attending: Radiation Oncology | Admitting: Radiation Oncology

## 2021-08-12 ENCOUNTER — Other Ambulatory Visit: Payer: Self-pay | Admitting: Hematology and Oncology

## 2021-08-12 ENCOUNTER — Ambulatory Visit: Payer: BC Managed Care – PPO

## 2021-08-12 NOTE — Telephone Encounter (Signed)
Called Harmonii back and she has decided not to use the Dignicap.  Advised her of upcoming appointments and times for chemotherapy.  She verbalized understanding and agreement.

## 2021-08-12 NOTE — Telephone Encounter (Signed)
Called Tammie Gilmore and advised her of new CT scan appointment on 08/14/21 at 2:30 at [facility].  Also discussed that the chemotherapy she will be getting is Doxorubicin which is an infusion every 3 weeks.  It is a 4 hour infusion.  She verbalized understanding and agreement.  Also discussed the Dignicap.  She is going to look at the website and will call back with her decision.

## 2021-08-13 ENCOUNTER — Ambulatory Visit (HOSPITAL_COMMUNITY): Payer: BC Managed Care – PPO

## 2021-08-13 ENCOUNTER — Ambulatory Visit (HOSPITAL_COMMUNITY)
Admission: RE | Admit: 2021-08-13 | Discharge: 2021-08-13 | Disposition: A | Discharge status: Home / Self Care | Payer: BC Managed Care – PPO | Source: Ambulatory Visit | Attending: Hematology and Oncology | Admitting: Hematology and Oncology

## 2021-08-13 ENCOUNTER — Encounter (HOSPITAL_COMMUNITY): Payer: Self-pay

## 2021-08-13 ENCOUNTER — Other Ambulatory Visit: Payer: Self-pay

## 2021-08-13 DIAGNOSIS — K589 Irritable bowel syndrome without diarrhea: Secondary | ICD-10-CM | POA: Diagnosis not present

## 2021-08-13 DIAGNOSIS — C7802 Secondary malignant neoplasm of left lung: Secondary | ICD-10-CM | POA: Insufficient documentation

## 2021-08-13 DIAGNOSIS — C7801 Secondary malignant neoplasm of right lung: Secondary | ICD-10-CM | POA: Diagnosis not present

## 2021-08-13 DIAGNOSIS — Z881 Allergy status to other antibiotic agents status: Secondary | ICD-10-CM | POA: Diagnosis not present

## 2021-08-13 DIAGNOSIS — C55 Malignant neoplasm of uterus, part unspecified: Secondary | ICD-10-CM | POA: Insufficient documentation

## 2021-08-13 DIAGNOSIS — R739 Hyperglycemia, unspecified: Secondary | ICD-10-CM | POA: Diagnosis not present

## 2021-08-13 HISTORY — PX: IR IMAGING GUIDED PORT INSERTION: IMG5740

## 2021-08-13 LAB — GLUCOSE, CAPILLARY: Glucose-Capillary: 108 mg/dL — ABNORMAL HIGH (ref 70–99)

## 2021-08-13 MED ORDER — ONDANSETRON HCL 4 MG/2ML IJ SOLN
INTRAMUSCULAR | Status: AC
  Filled 2021-08-13: qty 2

## 2021-08-13 MED ORDER — MIDAZOLAM HCL 2 MG/2ML IJ SOLN
INTRAMUSCULAR | Status: AC
  Filled 2021-08-13: qty 2

## 2021-08-13 MED ORDER — MIDAZOLAM HCL 2 MG/2ML IJ SOLN
INTRAMUSCULAR | Status: AC | PRN
  Administered 2021-08-13 (×3): 1 mg via INTRAVENOUS

## 2021-08-13 MED ORDER — SODIUM CHLORIDE 0.9 % IV SOLN: INTRAVENOUS | Status: DC

## 2021-08-13 MED ORDER — LIDOCAINE-EPINEPHRINE 1 %-1:100000 IJ SOLN
INTRAMUSCULAR | Status: AC
  Administered 2021-08-13: 20 mL
  Filled 2021-08-13: qty 1

## 2021-08-13 MED ORDER — ONDANSETRON HCL 4 MG/2ML IJ SOLN
INTRAMUSCULAR | Status: AC | PRN
  Administered 2021-08-13: 4 mg via INTRAVENOUS

## 2021-08-13 MED ORDER — HEPARIN SOD (PORK) LOCK FLUSH 100 UNIT/ML IV SOLN
INTRAVENOUS | Status: AC
  Administered 2021-08-13: 500 [IU]
  Filled 2021-08-13: qty 5

## 2021-08-13 MED ORDER — FENTANYL CITRATE (PF) 100 MCG/2ML IJ SOLN
INTRAMUSCULAR | Status: AC | PRN
  Administered 2021-08-13 (×2): 50 ug via INTRAVENOUS

## 2021-08-13 MED ORDER — FENTANYL CITRATE (PF) 100 MCG/2ML IJ SOLN
INTRAMUSCULAR | Status: AC
  Filled 2021-08-13: qty 2

## 2021-08-13 NOTE — H&P (Signed)
Chief Complaint: Patient was seen in consultation today for Port-A-Cath placement  at the request of [facility] [place]  Referring Physician(s): [name]  Supervising Physician: Jacqulynn Cadet  Patient Status: [facility] - Out-pt  History of Present Illness: Tammie Gilmore is a 55 y.o. female with PMH of IBS, hyperglycemia and hernia repair.  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  On 07/17/2021 patient had a radical abdominal hysterectomy with total pelvic lymphadenectomy and para-aortic lymph node biopsy.  Patient CT abdomen on 08/10/2021 was positive for left pulmonary nodules concerning for metastatic disease.  Patient was referred by Dr. Alvy Bimler today for Port-A-Cath placement to start chemotherapy.  Past Medical History:  Diagnosis Date   Hypoglycemia    occasional episodes of hypoglycemia   IBS (irritable bowel syndrome)     Past Surgical History:  Procedure Laterality Date   HERNIA REPAIR  1994   left inguinal, with mesh    Allergies: Doxycycline  Medications: Prior to Admission medications   Medication Sig Start Date End Date Taking? Authorizing Provider  acetaminophen (TYLENOL) 500 MG tablet Take 1,000 mg by mouth every 6 (six) hours as needed for moderate pain or headache.   Yes [provider]  senna (SENOKOT) 8.6 MG tablet Take 2 tablets by mouth at bedtime as needed for constipation. 07/20/21 08/19/21 Yes [provider]     Family History  Problem Relation Age of Onset   Heart attack Mother    Heart disease Mother    Endometriosis Mother    Heart disease Father    Heart attack Father    Cancer - Colon Neg Hx    Breast cancer Neg Hx    Cancer Neg Hx    Ovarian cancer Neg Hx    Uterine cancer Neg Hx    Pancreatic cancer Neg Hx    Pancreatic disease Neg Hx    Prostate cancer Neg Hx     Social History    Socioeconomic History   Marital status: Married    Spouse name: Not on file   Number of children: 3   Years of education: Not on file   Highest education level: Not on file  Occupational History   Occupation: accountant  Tobacco Use   Smoking status: Never   Smokeless tobacco: Never  Vaping Use   Vaping Use: Never used  Substance and Sexual Activity   Alcohol use: Never   Drug use: Never   Sexual activity: Not Currently  Other Topics Concern   Not on file  Social History Narrative   Not on file   Social Determinants of Health   Financial Resource Strain: Not on file  Food Insecurity: Not on file  Transportation Needs: Not on file  Physical Activity: Not on file  Stress: Not on file  Social Connections: Not on file     Review of Systems: A 12 point ROS discussed and pertinent positives are indicated in the HPI above.  All other systems are negative.  Review of Systems  Constitutional:  Positive for appetite change. Negative for chills and fever.  HENT:  Negative for nosebleeds.   Respiratory:  Negative for cough and shortness of breath.   Cardiovascular:  Positive for palpitations. Negative for chest pain and leg swelling.  Gastrointestinal:  Positive for nausea. Negative for abdominal pain, blood in stool, diarrhea and vomiting.  Neurological:  Negative for dizziness, light-headedness and headaches.  Vital Signs: BP 126/83 (BP Location: Right Arm)   Pulse 81   Temp 97.9 F (36.6 C) (Oral)   Ht 5\' 8"  (1.727 m)   Wt 138 lb (62.6 kg)   SpO2 100%   BMI 20.98 kg/m   Physical Exam Constitutional:      Appearance: Normal appearance. She is not ill-appearing.  HENT:     Head: Normocephalic and atraumatic.     Mouth/Throat:     Mouth: Mucous membranes are moist.     Pharynx: Oropharynx is clear.  Eyes:     Pupils: Pupils are equal, round, and reactive to light.  Cardiovascular:     Rate and Rhythm: Normal rate and regular rhythm.     Pulses: Normal  pulses.     Heart sounds: Normal heart sounds. No murmur heard.   No gallop.  Pulmonary:     Effort: Pulmonary effort is normal. No respiratory distress.     Breath sounds: Normal breath sounds. No stridor. No wheezing, rhonchi or rales.  Abdominal:     General: Bowel sounds are normal. There is no distension.     Palpations: Abdomen is soft.     Tenderness: There is no abdominal tenderness. There is no guarding.  Musculoskeletal:     Right lower leg: No edema.     Left lower leg: No edema.  Skin:    General: Skin is warm and dry.  Neurological:     Mental Status: She is alert and oriented to person, place, and time.  Psychiatric:        Mood and Affect: Mood normal.        Behavior: Behavior normal.        Thought Content: Thought content normal.        Judgment: Judgment normal.    Imaging: CT Abdomen Pelvis W Contrast  Result Date: 08/11/2021 CLINICAL DATA:  Hysterectomy for leiomyosarcoma.  Restaging EXAM: CT ABDOMEN AND PELVIS WITH CONTRAST TECHNIQUE: Multidetector CT imaging of the abdomen and pelvis was performed using the standard protocol following bolus administration of intravenous contrast. CONTRAST:  75mL OMNIPAQUE IOHEXOL 350 MG/ML SOLN COMPARISON:  PET-CT 06/29/2021 FINDINGS: Lower chest: New 7 mm left lower lobe pulmonary nodule evident on 23/4. 4 mm left lower lobe nodule on 29/4 is also new in the interval since prior PET-CT. Tiny cluster of tree-in-bud opacity in the peripheral right lower lobe (16/4) is new and compatible with sequelae of atypical infection. No pleural effusion. Hepatobiliary: No suspicious focal abnormality within the liver parenchyma. Gallbladder is nondistended. No intrahepatic or extrahepatic biliary dilation. Pancreas: No focal mass lesion. No dilatation of the main duct. No intraparenchymal cyst. No peripancreatic edema. Spleen: No splenomegaly. No focal mass lesion. Adrenals/Urinary Tract: No adrenal nodule or mass. Kidneys unremarkable. No  evidence for hydroureter. Bladder is nondistended. Stomach/Bowel: Stomach is unremarkable. No gastric wall thickening. No evidence of outlet obstruction. Duodenum is normally positioned as is the ligament of Treitz. No small bowel wall thickening. No small bowel dilatation. The terminal ileum is normal. The appendix is not well visualized, but there is no edema or inflammation in the region of the cecum. No gross colonic mass. No colonic wall thickening. Vascular/Lymphatic: No abdominal aortic aneurysm. No abdominal lymphadenopathy. Postoperative seroma noted left pelvic sidewall. 13 mm short axis left pelvic sidewall lymph node visible on 73/2. Immediately adjacent 7 mm short axis node visible on 74/2. Postsurgical changes noted right pelvic sidewall without evidence for lymphadenopathy in the right pelvis. 7 mm right perirectal  node on 80/2 is new since prior as is a 8 mm short axis presacral node on 73/2. 8 mm perirectal node seen on the right on 77/2. Reproductive: Uterus surgically absent.  There is no adnexal mass. Other: Trace free fluid seen in the pelvis. Musculoskeletal: Stable tiny sclerotic foci in the left iliac bone, likely bone islands. Stable appearance sclerotic lesion right iliac bone. IMPRESSION: 1. Interval development of left lobe pulmonary nodules, measuring up to 7 mm and highly for metastatic disease. 2. Interval development of small to upper normal lymph nodes in the pelvis, concerning for metastatic disease. 3. Postoperative seroma left pelvic sidewall. 4. Tiny cluster of tree-in-bud opacity in the peripheral right lower lobe is new and compatible with sequelae of atypical infection. Electronically Signed   By: Misty Stanley M.D.   On: 08/11/2021 10:47    Labs:  CBC: Recent Labs    06/26/21 1505  WBC 9.5  HGB 12.8  HCT 38.4  PLT 347    COAGS: No results for input(s): INR, APTT in the last 8760 hours.  BMP: No results for input(s): NA, K, CL, CO2, GLUCOSE, BUN, CALCIUM,  CREATININE, GFRNONAA, GFRAA in the last 8760 hours.  Invalid input(s): CMP  LIVER FUNCTION TESTS: No results for input(s): BILITOT, AST, ALT, ALKPHOS, PROT, ALBUMIN in the last 8760 hours.  TUMOR MARKERS: No results for input(s): AFPTM, CEA, CA199, CHROMGRNA in the last 8760 hours.  Assessment and Plan: History of IBS, hyperglycemia and hernia repair.  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  On 07/17/2021 patient had a radical abdominal hysterectomy with total pelvic lymphadenectomy and para-aortic lymph node biopsy.  Patient CT abdomen on 08/10/2021 was positive for left pulmonary nodules concerning for metastatic disease.  Patient was referred by Dr. Alvy Bimler today for Port-A-Cath placement to start chemotherapy.  Patient observed to be resting on stretcher.  She is alert and oriented, calm and pleasant. She expresses nervousness over starting chemotherapy. She is in no distress.  Patient states she is NPO per order. Vital signs are stable. No labs needed today.  Risks and benefits of image guided port-a-catheter placement was discussed with the patient including, but not limited to bleeding, infection, pneumothorax, or fibrin sheath development and need for additional procedures.  All of the patient's questions were answered, patient is agreeable to proceed. Consent signed and in chart.   Thank you for this interesting consult.  I greatly enjoyed meeting Tammie Gilmore and look forward to participating in their care.  A copy of this report was sent to the requesting provider on this date.  Electronically Signed: Tyson Alias, NP 08/13/2021, 8:09 AM   I spent a total of 30 minutes in face to face in clinical consultation, greater than 50% of which was counseling/coordinating care for Port-A-Cath placement.

## 2021-08-13 NOTE — Procedures (Signed)
Interventional Radiology Procedure Note  Procedure: Placement of a right IJ approach single lumen PowerPort.  Tip is positioned at the superior cavoatrial junction and catheter is ready for immediate use.  Complications: No immediate Recommendations:  - Ok to shower tomorrow - Do not submerge for 7 days - Routine line care   Signed,  Folasade Mooty K. Gregorio Worley, MD   

## 2021-08-14 ENCOUNTER — Ambulatory Visit (HOSPITAL_BASED_OUTPATIENT_CLINIC_OR_DEPARTMENT_OTHER)
Admission: RE | Admit: 2021-08-14 | Discharge: 2021-08-14 | Disposition: A | Discharge status: Home / Self Care | Payer: BC Managed Care – PPO | Source: Ambulatory Visit | Attending: Gynecologic Oncology | Admitting: Gynecologic Oncology

## 2021-08-14 ENCOUNTER — Ambulatory Visit (HOSPITAL_BASED_OUTPATIENT_CLINIC_OR_DEPARTMENT_OTHER)
Admission: RE | Admit: 2021-08-14 | Discharge: 2021-08-14 | Disposition: A | Discharge status: Home / Self Care | Payer: BC Managed Care – PPO | Source: Ambulatory Visit | Attending: Hematology and Oncology | Admitting: Hematology and Oncology

## 2021-08-14 DIAGNOSIS — C55 Malignant neoplasm of uterus, part unspecified: Secondary | ICD-10-CM | POA: Diagnosis not present

## 2021-08-14 DIAGNOSIS — Z0189 Encounter for other specified special examinations: Secondary | ICD-10-CM

## 2021-08-14 DIAGNOSIS — Z5111 Encounter for antineoplastic chemotherapy: Secondary | ICD-10-CM | POA: Diagnosis not present

## 2021-08-14 DIAGNOSIS — Z01810 Encounter for preprocedural cardiovascular examination: Secondary | ICD-10-CM | POA: Diagnosis not present

## 2021-08-14 DIAGNOSIS — C7801 Secondary malignant neoplasm of right lung: Secondary | ICD-10-CM | POA: Diagnosis not present

## 2021-08-14 DIAGNOSIS — C7802 Secondary malignant neoplasm of left lung: Secondary | ICD-10-CM

## 2021-08-14 LAB — ECHOCARDIOGRAM COMPLETE
Area-P 1/2: 4.41 cm2
S' Lateral: 2.7 cm

## 2021-08-14 MED ORDER — IOHEXOL 350 MG/ML SOLN
60.0000 mL | Freq: Once | INTRAVENOUS | Status: AC | PRN
  Administered 2021-08-14: 60 mL via INTRAVENOUS

## 2021-08-17 ENCOUNTER — Inpatient Hospital Stay: Payer: BC Managed Care – PPO | Attending: Gynecologic Oncology | Admitting: Hematology and Oncology

## 2021-08-17 ENCOUNTER — Other Ambulatory Visit: Payer: Self-pay

## 2021-08-17 ENCOUNTER — Other Ambulatory Visit: Payer: BC Managed Care – PPO

## 2021-08-17 ENCOUNTER — Ambulatory Visit (HOSPITAL_COMMUNITY): Payer: BC Managed Care – PPO

## 2021-08-17 ENCOUNTER — Inpatient Hospital Stay: Payer: BC Managed Care – PPO

## 2021-08-17 ENCOUNTER — Encounter: Payer: Self-pay | Admitting: Hematology and Oncology

## 2021-08-17 VITALS — BP 124/87 | HR 89 | Temp 99.7°F | Resp 18 | Ht 68.0 in | Wt 135.8 lb

## 2021-08-17 DIAGNOSIS — C7802 Secondary malignant neoplasm of left lung: Secondary | ICD-10-CM | POA: Insufficient documentation

## 2021-08-17 DIAGNOSIS — C55 Malignant neoplasm of uterus, part unspecified: Secondary | ICD-10-CM | POA: Insufficient documentation

## 2021-08-17 DIAGNOSIS — F419 Anxiety disorder, unspecified: Secondary | ICD-10-CM | POA: Diagnosis not present

## 2021-08-17 DIAGNOSIS — C7801 Secondary malignant neoplasm of right lung: Secondary | ICD-10-CM

## 2021-08-17 DIAGNOSIS — N3090 Cystitis, unspecified without hematuria: Secondary | ICD-10-CM | POA: Diagnosis not present

## 2021-08-17 DIAGNOSIS — F411 Generalized anxiety disorder: Secondary | ICD-10-CM | POA: Diagnosis not present

## 2021-08-17 DIAGNOSIS — K1231 Oral mucositis (ulcerative) due to antineoplastic therapy: Secondary | ICD-10-CM | POA: Diagnosis not present

## 2021-08-17 DIAGNOSIS — Z7189 Other specified counseling: Secondary | ICD-10-CM | POA: Diagnosis not present

## 2021-08-17 LAB — CBC WITH DIFFERENTIAL (CANCER CENTER ONLY)
Abs Immature Granulocytes: 0.01 10*3/uL (ref 0.00–0.07)
Basophils Absolute: 0 10*3/uL (ref 0.0–0.1)
Basophils Relative: 1 %
Eosinophils Absolute: 0.1 10*3/uL (ref 0.0–0.5)
Eosinophils Relative: 2 %
HCT: 38.5 % (ref 36.0–46.0)
Hemoglobin: 12.3 g/dL (ref 12.0–15.0)
Immature Granulocytes: 0 %
Lymphocytes Relative: 27 %
Lymphs Abs: 1.5 10*3/uL (ref 0.7–4.0)
MCH: 28.5 pg (ref 26.0–34.0)
MCHC: 31.9 g/dL (ref 30.0–36.0)
MCV: 89.3 fL (ref 80.0–100.0)
Monocytes Absolute: 0.3 10*3/uL (ref 0.1–1.0)
Monocytes Relative: 5 %
Neutro Abs: 3.6 10*3/uL (ref 1.7–7.7)
Neutrophils Relative %: 65 %
Platelet Count: 228 10*3/uL (ref 150–400)
RBC: 4.31 MIL/uL (ref 3.87–5.11)
RDW: 13 % (ref 11.5–15.5)
WBC Count: 5.5 10*3/uL (ref 4.0–10.5)
nRBC: 0 % (ref 0.0–0.2)

## 2021-08-17 LAB — CMP (CANCER CENTER ONLY)
ALT: 15 U/L (ref 0–44)
AST: 12 U/L — ABNORMAL LOW (ref 15–41)
Albumin: 4.1 g/dL (ref 3.5–5.0)
Alkaline Phosphatase: 76 U/L (ref 38–126)
Anion gap: 8 (ref 5–15)
BUN: 11 mg/dL (ref 6–20)
CO2: 28 mmol/L (ref 22–32)
Calcium: 8.9 mg/dL (ref 8.9–10.3)
Chloride: 106 mmol/L (ref 98–111)
Creatinine: 0.82 mg/dL (ref 0.44–1.00)
GFR, Estimated: >60 mL/min (ref >60)
Glucose, Bld: 124 mg/dL — ABNORMAL HIGH (ref 70–99)
Potassium: 3.5 mmol/L (ref 3.5–5.1)
Sodium: 142 mmol/L (ref 135–145)
Total Bilirubin: 0.3 mg/dL (ref 0.3–1.2)
Total Protein: 7 g/dL (ref 6.5–8.1)

## 2021-08-17 MED ORDER — LIDOCAINE-PRILOCAINE 2.5-2.5 % EX CREA: TOPICAL_CREAM | CUTANEOUS | 3 refills | Status: DC

## 2021-08-17 MED ORDER — PROCHLORPERAZINE MALEATE 10 MG PO TABS: 10.0000 mg | ORAL_TABLET | Freq: Four times a day (QID) | ORAL | 1 refills | Status: DC | PRN

## 2021-08-17 MED ORDER — LORAZEPAM 0.5 MG PO TABS: 0.5000 mg | ORAL_TABLET | Freq: Two times a day (BID) | ORAL | 0 refills | Status: DC | PRN

## 2021-08-17 MED ORDER — ONDANSETRON HCL 8 MG PO TABS: 8.0000 mg | ORAL_TABLET | Freq: Three times a day (TID) | ORAL | 1 refills | Status: DC | PRN

## 2021-08-17 MED FILL — Dexamethasone Sodium Phosphate Inj 100 MG/10ML: INTRAMUSCULAR | Qty: 1 | Status: AC

## 2021-08-17 MED FILL — Fosaprepitant Dimeglumine For IV Infusion 150 MG (Base Eq): INTRAVENOUS | Qty: 5 | Status: AC

## 2021-08-17 NOTE — Progress Notes (Signed)
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[name] I have reviewed multiple CT imaging with the patient and her husband Unfortunately, those pulmonary metastasis were new since her PET CT scan from September The patient is informed that she has stage IV disease We discussed why surgery is not indicated She had port placement and echocardiogram which was within normal range   We discussed the risk, benefits, side effects of doxorubicin for treatment of metastatic leiomyosarcoma and she is in agreement to proceed I recommend minimum 3 cycles before repeating CT imaging for objective assessment of response to therapy  Pulmonary metastases ([place]) She is currently asymptomatic Observe closely  Anxiety, generalized It is understandable that she developed significant anxiety over her situation I recommend low-dose lorazepam to take as needed  Goals of care, counseling/discussion She is aware that treatment goal is palliative but due to her young age, we will treat her as aggressive as possible and that is still a chance for complete remission with aggressive treatment We discussed the risk and benefits of her returning back to work  No orders of the defined types were placed in this encounter.   All questions were answered. The patient knows to call the clinic with any problems, questions or concerns. The total time spent in the appointment was 40 minutes encounter with patients including review of chart and various tests results, discussions about plan of care and coordination of care plan   Tammie Lark, MD 08/17/2021 4:15 PM  INTERVAL HISTORY: Please see below for problem oriented charting. she returns for treatment follow-up with her husband She appears anxious She denies recent cough, chest pain or shortness of breath We reviewed multiple imaging studies and  focus a lot of our discussion on plan of care  REVIEW OF SYSTEMS:   Constitutional: Denies fevers, chills or abnormal weight loss Eyes: Denies blurriness of vision Ears, nose, mouth, throat, and face: Denies mucositis or sore throat Respiratory: Denies cough, dyspnea or wheezes Cardiovascular: Denies palpitation, chest discomfort or lower extremity swelling Gastrointestinal:  Denies nausea, heartburn or change in bowel habits Skin: Denies abnormal skin rashes Lymphatics: Denies new lymphadenopathy or easy bruising Neurological:Denies numbness, tingling or new weaknesses Behavioral/Psych: Mood is stable, no new changes  All other systems were reviewed with the patient and are negative.  I have reviewed the past medical history, past surgical history, social history and family history with the patient and they are unchanged from previous note.  ALLERGIES:  is allergic to doxycycline.  MEDICATIONS:  Current Outpatient Medications  Medication Sig Dispense Refill   LORazepam (ATIVAN) 0.5 MG tablet Take 1 tablet (0.5 mg total) by mouth 2 (two) times daily as needed for anxiety. 30 tablet 0   acetaminophen (TYLENOL) 500 MG tablet Take 1,000 mg by mouth every 6 (six) hours as needed for moderate pain or headache.     lidocaine-prilocaine (EMLA) cream Apply to affected area once 30 g 3   ondansetron (ZOFRAN) 8 MG tablet Take 1 tablet (8 mg total) by mouth every 8 (eight) hours as needed. 30 tablet 1   prochlorperazine (COMPAZINE) 10 MG tablet Take 1 tablet (10 mg total) by mouth every 6 (six) hours as needed (Nausea or vomiting). 30 tablet 1   senna (SENOKOT) 8.6 MG tablet Take 2 tablets by mouth at bedtime as needed for constipation.     No current facility-administered medications for  this visit.    SUMMARY OF ONCOLOGIC HISTORY: Oncology History  Uterine leiomyosarcoma ([place])  06/11/2021 Imaging   1. 9.5 x 7.6 x 9.0 cm complex, partially necrotic, mass involving the lower uterine segment/  cervix. No obvious direct extension into the parametrium.  2. 9 mm left pelvic sidewall lymph node is partially necrotic and worrisome for metastatic adenopathy.  3. No findings for abdominal omental or peritoneal surface disease or adenopathy.  4. Tiny low-attenuation lesion in the pancreatic head, likely benign cyst but attention on follow-up scans is suggested.  5. 2.9 cm fundal fibroid.    06/19/2021 Pathology Results   FINAL MICROSCOPIC DIAGNOSIS:   A. UTERINE, CERVICAL MASS, BIOPSY:  - Spindle cell malignancy.  - See comment.   COMMENT:  The biopsies consist of endocervical mucosa with stromal edema and one biopsy fragment has a microscopic focus with atypical spindle cells consistent with poorly differentiated malignancy.  The differential  includes a spindle cell malignancy such as sarcomatoid carcinoma and leiomyosarcoma.  Mullerian adenosarcoma is also a consideration but considered less likely   06/23/2021 Imaging   MR pelvis  10 cm uterine mass with central necrosis, which is centered in the cervix and lower uterine segment. Right parametrial involvement is seen as well as suspected invasion of the distal rectum. Differential diagnosis includes cervical carcinoma and uterine leiomyosarcoma.   Mild bilateral iliac lymphadenopathy, highly suspicious for metastatic disease.   2.9 cm subserosal fibroid in the posterior fundus.   Normal appearance of both ovaries.     06/29/2021 PET scan   1. Hypermetabolic necrotic cervical/uterine mass with bilateral external iliac hypermetabolic lymph nodes. No evidence of distant metastatic disease. 2. 1.5 cm low-attenuation left thyroid nodule. Recommend thyroid ultrasound. (Ref: J Am Coll Radiol. 2015 Feb;12(2): 143-50).   07/17/2021 Pathology Results   A: Uterus with cervix and bilateral ovaries and fallopian tubes, radical hysterectomy and bilateral salpingo-oophorectomy - Leiomyosarcoma, high grade (grade 3 / 3) with extensive  epithelioid, pleomorphic, and myxoid areas and associated necrosis (~20%) - Tumor based in cervix and also involves lower uterine segment - Cervicovaginal margin involved by focal invasive leiomyosarcoma (3:00-5:00, A10) as well as tumor in lymphovascular spaces - Leiomyosarcoma involves right and left parametrial tissue and extends to parametrial margins - Extensive lymphovascular space invasion present, including in uterus and parametria - See synoptic report and comment   Other findings: - Leiomyomata with hyalinization, size up to 3.0 cm - Ovaries and fallopian tubes with no parenchymal involvement by leiomyosarcoma identified, although adnexal lymphovascular space invasion is present   B: Lymph nodes, right pelvic, lymphadenectomy - One of four lymph nodes positive for metastatic leiomyosarcoma (1/4), with extracapsular extension present   C: Lymph nodes, left pelvic, lymphadenectomy - One of four lymph nodes positive for metastatic leiomyosarcoma (1/4), with extracapsular extension present  Immunohistochemical stains are performed on block A11, and demonstrate that the tumor is positive for desmin and CD10, with SMA staining the majority of the spindle cell component but largely negative in the epithelioid / pleomorphic component. OSCAR, pancytokeratin AE1/AE3, HMB45, and PR appear negative in the tumor. ER shows patchy weak staining and myogenin stains rare cells. Block A23 also shows positive desmin and negative OSCAR pancytokeratin. Overall, the findings are most consistent with leiomyosarcoma, with extensive areas that are myxoid, epithelioid, and pleomorphic as well as more typical spindle cell areas within the overall high grade tumor (grade 3 / 3). The tumor is staged as pT2b (involves other pelvic tissues) given the parametrial involvement  and pN1 for FIGO stage IIIC.    07/17/2021 Surgery   Date of Surgery: 07/17/21  Preoperative Diagnosis: High Grade Uterine  Sarcoma  Postoperative Diagnosis: Same  Procedure(s): Bilateral - RADICAL ABDOMINAL HYSTER, W/BIL TOTAL PELVIC LYMPHADENECTOMY & PARA-AORTIC LYMPH NODE BX W/WO REM TUBE/OVAR VAGINAL HYSTERECTOMY, FOR UTERUS 250 G OR LESS; WITH REPAIR OF ENTEROCELE COLECTOMY, PARTIAL; WITH COLOPROCTOSTOMY (LOW PELVIC ANASTOMOSIS) WITH COLOSTOMY CYSTOURETHROSCOPY, WITH INSERTION OF INDWELLING URETERAL STENT (EG, GIBBONS OR DOUBLE-J TYPE) - Cystourethroscopy - Bilateral ureteral stent placement - Foley catheter placement  Performing Service: Gynecology Oncology Surgeon(s) and Role: Panel 1: * Lafonda Mosses, MD - Primary * Bernadene [place], MD - Resident - Assisting * Devonne Doughty, MD - Resident - Assisting Panel 2: * Franchot Erichsen, MD - Primary  Drains:  - Left 6Fr open-ended ureteral access catheter (green) - Right 5Fr open-ended ureteral access catheter (white) - 16Fr foley catheter to drainage  * No implants in log *  Indications: 55 y.o. female with high grade uterine sarcoma. Urology was consulted pre-operatively for placement of bilateral ureteral stents. Risks, benefits, and alternatives of the above procedure were discussed and informed consent was signed.  OperativeFindings:  - Grossly distorted architecture of urinary bladder likely 2/2 pelvic mass with anterolaterally positioned UOs - Successful placement of bilateral open-ended ureteral catheters under direct visualization - Foley catheter placed at case conclusion  Description: The patient was correctly identified in the preop holding area where written informed consent as well potential risk and complication reviewed. She agreed. The patient was brought to the operative suite where a preinduction timeout was performed. Once correct information was verified, general anesthesia was induced. The patient was then gently placed into dorsal lithotomy position with SCDs in place for VTE prophylaxis. They were prepped and  draped in the usual sterile fashion and given appropriate preoperative antibiotics. A second timeout was then performed.   We inserted a 16F rigid cystoscope per urethra with copious lubrication and normal saline irrigation running. We performed cystourethroscopy, which revealed the above findings.  We turned our attention to the left ureteral orifice and canulated it with a sensor wire, using assistance of a 6Fr open-ended catheter. The wire was advanced into the renal pelvis without difficulty under visual guidance. We then advanced the stent over our wire into the renal pelvis under direct visualization and feel without complication. The wire was subsequently removed.   We then turned our attention to the right ureteral orifice and canulated it with a sensor wire, using assistance of a 5Fr open-ended catheter. The wire was advanced into the renal pelvis without difficulty under visual guidance. We then advanced the stent over our wire into the renal pelvis under direct visualization and feel without complication. The wire was subsequently removed.   A 16Fr straight catheter was placed, with return of urine indicating appropriate position within the bladder. The balloon was inflated with 10cc sterile water. The stents were secured to the Foley using 0-silk ties, being careful not to occlude the stents or Foley.   The patient was awoken from general anesthesia having tolerated the procedure well and taken to the PACU for routine post-operative recovery.  Post-Op Plan:  - Foley and stents per primary team    07/17/2021 Surgery   Date of Surgery: 07/17/2021  Pre-op Diagnosis: Uterine spindle cell malignancy  Post-op Diagnosis: Same  Procedure(s): Panel 1 RADICAL ABDOMINAL HYSTER, with bilateral S&O, vagineconty upper, bilateral pelvic lyphadenectomy, bilateral ureterolysis,: 64403 (CPT) Panel 2 CYSTOURETHROSCOPY, WITH  INSERTION OF INDWELLING URETERAL STENT (EG, GIBBONS OR DOUBLE-J TYPE):  42706 (CPT) Note: Revisions to procedures should be made in chart - see Procedures activity.  Performing Service: Gynecology Oncology Surgeon(s) and Role: Panel 1: * Lafonda Mosses, MD - Primary * Bernadene [place], MD - Resident - Assisting * Devonne Doughty, MD - Resident - Assisting Panel 2: * Franchot Erichsen, MD - Primary  Findings: On bimanual exam, 10cm necrotic mass filling upper vagina, unable to discretely palpate the cervix. On rectovaginal exam, rectal involvement not identified. Intraoperatively, normal upper abdominal survey including normal liver, diaphragm, stomach, omentum and bowel. Small uterus with 10cm mass expanding the cervix. Palpably enlarged bilateral pelvic lymph nodes adherent to the external iliac veins and obturator nerves, removed. No palpable para-aortic lymphadenopathy. No rectal involvement of uterine mass.   Specimens:  ID Type Source Tests Collected by Time Destination  1 : uterus,cervix,bilateral tubes/ovaries Tissue Uterus SURGICAL PATHOLOGY EXAM Lafonda Mosses, MD 07/17/2021 0932  2 : right pelvic lymph node Tissue Lymph Node SURGICAL PATHOLOGY EXAM Lafonda Mosses, MD 07/17/2021 1125  3 : LEFT PELVIC LN Tissue Lymph Node SURGICAL PATHOLOGY EXAM Lafonda Mosses, MD 07/17/2021 1144    08/06/2021 Initial Diagnosis   Uterine leiomyosarcoma ([place])   08/06/2021 Cancer Staging   Staging form: Corpus Uteri - Leiomyosarcoma and Endometrial Stromal Sarcoma, AJCC 8th Edition - Pathologic stage from 08/06/2021: FIGO Stage IVB (pT3, pN1, cM1) - Signed by Tammie Lark, MD on 08/11/2021 Stage prefix: Initial diagnosis    08/10/2021 Imaging   CT abdomen and pelvis 1. Interval development of left lobe pulmonary nodules, measuring up to 7 mm and highly for metastatic disease. 2. Interval development of small to upper normal lymph nodes in the pelvis, concerning for metastatic disease. 3. Postoperative seroma left pelvic sidewall. 4. Tiny cluster  of tree-in-bud opacity in the peripheral right lower lobe is new and compatible with sequelae of atypical infection.   08/13/2021 Procedure   Procedure: Placement of a right IJ approach single lumen PowerPort.  Tip is positioned at the superior cavoatrial junction and catheter is ready for immediate use.  Complications: No immediate   08/14/2021 Echocardiogram    1. Left ventricular ejection fraction, by estimation, is 60 to 65%. The left ventricle has normal function. The left ventricle has no regional wall motion abnormalities. Left ventricular diastolic parameters were normal. The average left ventricular global longitudinal strain is -17.4 %. The global longitudinal strain is normal.  2. Right ventricular systolic function is normal. The right ventricular size is normal.  3. The mitral valve is normal in structure. No evidence of mitral valve regurgitation. No evidence of mitral stenosis.  4. The aortic valve is tricuspid. Aortic valve regurgitation is not visualized. No aortic stenosis is present.  5. The inferior vena cava is normal in size with greater than 50% respiratory variability, suggesting right atrial pressure of 3 mmHg.     08/17/2021 Imaging   Multiple new and enlarging pulmonary nodules scattered throughout the lungs bilaterally, highly concerning for progressive metastatic disease to the lungs   08/18/2021 -  Chemotherapy   Patient is on Treatment Plan : UTERINE LEIOMYOSARCOMA Doxorubicin q21d x 6 Cycles     Pulmonary metastases ([place])  08/11/2021 Initial Diagnosis   Pulmonary metastases ([place])   08/18/2021 -  Chemotherapy   Patient is on Treatment Plan : UTERINE LEIOMYOSARCOMA Doxorubicin q21d x 6 Cycles       PHYSICAL EXAMINATION: ECOG PERFORMANCE STATUS: 1 - Symptomatic  but completely ambulatory  Vitals:   08/17/21 1528  BP: 124/87  Pulse: 89  Resp: 18  Temp: 99.7 F (37.6 C)  SpO2: 100%   Filed Weights   08/17/21 1528  Weight: 135 lb 12.8 oz (61.6 kg)     GENERAL:alert, no distress and comfortable  NEURO: alert & oriented x 3 with fluent speech, no focal motor/sensory deficits  LABORATORY DATA:  I have reviewed the data as listed    Component Value Date/Time   NA 142 08/17/2021 1507   K 3.5 08/17/2021 1507   CL 106 08/17/2021 1507   CO2 28 08/17/2021 1507   GLUCOSE 124 (H) 08/17/2021 1507   BUN 11 08/17/2021 1507   CREATININE 0.82 08/17/2021 1507   CALCIUM 8.9 08/17/2021 1507   PROT 7.0 08/17/2021 1507   ALBUMIN 4.1 08/17/2021 1507   AST 12 (L) 08/17/2021 1507   ALT 15 08/17/2021 1507   ALKPHOS 76 08/17/2021 1507   BILITOT 0.3 08/17/2021 1507   GFRNONAA >60 08/17/2021 1507    No results found for: SPEP, UPEP  Lab Results  Component Value Date   WBC 5.5 08/17/2021   NEUTROABS 3.6 08/17/2021   HGB 12.3 08/17/2021   HCT 38.5 08/17/2021   MCV 89.3 08/17/2021   PLT 228 08/17/2021      Chemistry      Component Value Date/Time   NA 142 08/17/2021 1507   K 3.5 08/17/2021 1507   CL 106 08/17/2021 1507   CO2 28 08/17/2021 1507   BUN 11 08/17/2021 1507   CREATININE 0.82 08/17/2021 1507      Component Value Date/Time   CALCIUM 8.9 08/17/2021 1507   ALKPHOS 76 08/17/2021 1507   AST 12 (L) 08/17/2021 1507   ALT 15 08/17/2021 1507   BILITOT 0.3 08/17/2021 1507       RADIOGRAPHIC STUDIES: I have reviewed multiple imaging studies with the patient I have personally reviewed the radiological images as listed and agreed with the findings in the report. CT CHEST W CONTRAST  Result Date: 08/17/2021 CLINICAL DATA:  55 year old female with history of uterine/cervical cancer. Staging examination to evaluate for metastatic disease. EXAM: CT CHEST WITH CONTRAST TECHNIQUE: Multidetector CT imaging of the chest was performed during intravenous contrast administration. CONTRAST:  49mL OMNIPAQUE IOHEXOL 350 MG/ML SOLN COMPARISON:  PET-CT 06/29/2021. FINDINGS: Cardiovascular: Heart size is normal. There is no significant pericardial  fluid, thickening or pericardial calcification. No atherosclerotic calcifications are noted in the thoracic aorta or the coronary arteries. Right internal jugular single-lumen porta cath with tip terminating in the right atrium. There is a small amount of gas in the subcutaneous fat adjacent to the port, likely related to recent implantation although this could alternatively be related to recent access. Mediastinum/Nodes: No pathologically enlarged mediastinal or hilar lymph nodes. Esophagus is unremarkable in appearance. No axillary lymphadenopathy. Lungs/Pleura: There are multiple new and enlarging pulmonary nodules scattered throughout the lungs bilaterally, highly concerning for metastatic disease to the lungs. The largest of these is in the medial aspect of the right upper lobe (axial image 45 of series 4) measuring 1.3 x 1.0 cm. Other prominent nodules include a left lower lobe nodule (axial image 109 of series 4) measuring 9 x 6 mm, and a right lower lobe nodule (axial image 75 of series 4) measuring 6 mm (previously only 2 mm on prior PET-CT 06/29/2021). No acute consolidative airspace disease. No pleural effusions. Upper Abdomen: Unremarkable. Musculoskeletal: There are no aggressive appearing lytic or blastic lesions noted  in the visualized portions of the skeleton. IMPRESSION: 1. Multiple new and enlarging pulmonary nodules scattered throughout the lungs bilaterally, highly concerning for progressive metastatic disease to the lungs. These results will be called to the ordering clinician or representative by the Radiologist Assistant, and communication documented in the PACS or [employer]. Electronically Signed   By: Vinnie Langton M.D.   On: 08/17/2021 08:12   CT Abdomen Pelvis W Contrast  Result Date: 08/11/2021 CLINICAL DATA:  Hysterectomy for leiomyosarcoma.  Restaging EXAM: CT ABDOMEN AND PELVIS WITH CONTRAST TECHNIQUE: Multidetector CT imaging of the abdomen and pelvis was performed using  the standard protocol following bolus administration of intravenous contrast. CONTRAST:  67mL OMNIPAQUE IOHEXOL 350 MG/ML SOLN COMPARISON:  PET-CT 06/29/2021 FINDINGS: Lower chest: New 7 mm left lower lobe pulmonary nodule evident on 23/4. 4 mm left lower lobe nodule on 29/4 is also new in the interval since prior PET-CT. Tiny cluster of tree-in-bud opacity in the peripheral right lower lobe (16/4) is new and compatible with sequelae of atypical infection. No pleural effusion. Hepatobiliary: No suspicious focal abnormality within the liver parenchyma. Gallbladder is nondistended. No intrahepatic or extrahepatic biliary dilation. Pancreas: No focal mass lesion. No dilatation of the main duct. No intraparenchymal cyst. No peripancreatic edema. Spleen: No splenomegaly. No focal mass lesion. Adrenals/Urinary Tract: No adrenal nodule or mass. Kidneys unremarkable. No evidence for hydroureter. Bladder is nondistended. Stomach/Bowel: Stomach is unremarkable. No gastric wall thickening. No evidence of outlet obstruction. Duodenum is normally positioned as is the ligament of Treitz. No small bowel wall thickening. No small bowel dilatation. The terminal ileum is normal. The appendix is not well visualized, but there is no edema or inflammation in the region of the cecum. No gross colonic mass. No colonic wall thickening. Vascular/Lymphatic: No abdominal aortic aneurysm. No abdominal lymphadenopathy. Postoperative seroma noted left pelvic sidewall. 13 mm short axis left pelvic sidewall lymph node visible on 73/2. Immediately adjacent 7 mm short axis node visible on 74/2. Postsurgical changes noted right pelvic sidewall without evidence for lymphadenopathy in the right pelvis. 7 mm right perirectal node on 80/2 is new since prior as is a 8 mm short axis presacral node on 73/2. 8 mm perirectal node seen on the right on 77/2. Reproductive: Uterus surgically absent.  There is no adnexal mass. Other: Trace free fluid seen in the  pelvis. Musculoskeletal: Stable tiny sclerotic foci in the left iliac bone, likely bone islands. Stable appearance sclerotic lesion right iliac bone. IMPRESSION: 1. Interval development of left lobe pulmonary nodules, measuring up to 7 mm and highly for metastatic disease. 2. Interval development of small to upper normal lymph nodes in the pelvis, concerning for metastatic disease. 3. Postoperative seroma left pelvic sidewall. 4. Tiny cluster of tree-in-bud opacity in the peripheral right lower lobe is new and compatible with sequelae of atypical infection. Electronically Signed   By: Misty Stanley M.D.   On: 08/11/2021 10:47   ECHOCARDIOGRAM COMPLETE  Result Date: 08/14/2021    ECHOCARDIOGRAM REPORT   Patient Name:   Tammie Gilmore Date of Exam: 08/14/2021 Medical Rec #:  [id number]         Height:       68.0 in Accession #:    [id number]        Weight:       138.0 lb Date of Birth:  10/03/1966        BSA:          1.746 m Patient Age:  54 years          BP:           147/85 mmHg Patient Gender: F                 HR:           73 bpm. Exam Location:  Outpatient Procedure: 2D Echo, Color Doppler, Cardiac Doppler and Strain Analysis Indications:    Chemo Evaluation  History:        Patient has no prior history of Echocardiogram examinations.  Sonographer:    Raquel Sarna Senior RDCS Referring Phys: [phone] [place]  Sonographer Comments: Scanned supine due to recent surgery IMPRESSIONS  1. Left ventricular ejection fraction, by estimation, is 60 to 65%. The left ventricle has normal function. The left ventricle has no regional wall motion abnormalities. Left ventricular diastolic parameters were normal. The average left ventricular global longitudinal strain is -17.4 %. The global longitudinal strain is normal.  2. Right ventricular systolic function is normal. The right ventricular size is normal.  3. The mitral valve is normal in structure. No evidence of mitral valve regurgitation. No evidence of mitral  stenosis.  4. The aortic valve is tricuspid. Aortic valve regurgitation is not visualized. No aortic stenosis is present.  5. The inferior vena cava is normal in size with greater than 50% respiratory variability, suggesting right atrial pressure of 3 mmHg. FINDINGS  Left Ventricle: Left ventricular ejection fraction, by estimation, is 60 to 65%. The left ventricle has normal function. The left ventricle has no regional wall motion abnormalities. The average left ventricular global longitudinal strain is -17.4 %. The global longitudinal strain is normal. The left ventricular internal cavity size was normal in size. There is no left ventricular hypertrophy of the anterior segment. Left ventricular diastolic parameters were normal. Indeterminate filling pressures. Right Ventricle: The right ventricular size is normal. No increase in right ventricular wall thickness. Right ventricular systolic function is normal. Left Atrium: Left atrial size was normal in size. Right Atrium: Right atrial size was normal in size. Pericardium: There is no evidence of pericardial effusion. Mitral Valve: The mitral valve is normal in structure. No evidence of mitral valve regurgitation. No evidence of mitral valve stenosis. Tricuspid Valve: The tricuspid valve is normal in structure. Tricuspid valve regurgitation is not demonstrated. No evidence of tricuspid stenosis. Aortic Valve: The aortic valve is tricuspid. Aortic valve regurgitation is not visualized. No aortic stenosis is present. Pulmonic Valve: The pulmonic valve was normal in structure. Pulmonic valve regurgitation is not visualized. No evidence of pulmonic stenosis. Aorta: The aortic root is normal in size and structure. Venous: The inferior vena cava is normal in size with greater than 50% respiratory variability, suggesting right atrial pressure of 3 mmHg. IAS/Shunts: No atrial level shunt detected by color flow Doppler.  LEFT VENTRICLE PLAX 2D LVIDd:         4.10 cm    Diastology LVIDs:         2.70 cm   LV e' medial:    7.29 cm/s LV PW:         0.80 cm   LV E/e' medial:  11.9 LV IVS:        0.60 cm   LV e' lateral:   7.94 cm/s LVOT diam:     1.70 cm   LV E/e' lateral: 10.9 LV SV:         37 LV SV Index:   21  2D Longitudinal Strain LVOT Area:     2.27 cm  2D Strain GLS (A2C):   -16.6 %                          2D Strain GLS (A3C):   -18.6 %                          2D Strain GLS (A4C):   -17.0 %                          2D Strain GLS Avg:     -17.4 % RIGHT VENTRICLE RV S prime:     11.00 cm/s TAPSE (M-mode): 2.0 cm LEFT ATRIUM             Index        RIGHT ATRIUM           Index LA diam:        2.80 cm 1.60 cm/m   RA Area:     11.20 cm LA Vol (A2C):   36.5 ml 20.91 ml/m  RA Volume:   24.20 ml  13.86 ml/m LA Vol (A4C):   26.8 ml 15.35 ml/m LA Biplane Vol: 31.9 ml 18.27 ml/m  AORTIC VALVE LVOT Vmax:   92.70 cm/s LVOT Vmean:  68.000 cm/s LVOT VTI:    0.163 m  AORTA Ao Root diam: 3.00 cm Ao Asc diam:  3.10 cm MITRAL VALVE MV Area (PHT): 4.41 cm    SHUNTS MV Decel Time: 172 msec    Systemic VTI:  0.16 m MV E velocity: 86.50 cm/s  Systemic Diam: 1.70 cm MV A velocity: 63.00 cm/s MV E/A ratio:  1.37 Skeet Latch MD Electronically signed by Skeet Latch MD Signature Date/Time: 08/14/2021/3:29:36 PM    Final    IR IMAGING GUIDED PORT INSERTION  Result Date: 08/13/2021 INDICATION: 55 year 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 She presents for port catheter placement to establish durable venous access. EXAM: IMPLANTED PORT A CATH PLACEMENT WITH ULTRASOUND AND FLUOROSCOPIC GUIDANCE MEDICATIONS: None. ANESTHESIA/SEDATION: Versed 3 mg IV; Fentanyl 100 mcg IV; Moderate Sedation Time:  28 minutes The patient was continuously monitored during the procedure by the interventional radiology nurse under my direct supervision. FLUOROSCOPY TIME:  1 minutes, 12 seconds (2 mGy) COMPLICATIONS: None immediate. PROCEDURE: The right neck and chest was  prepped with chlorhexidine, and draped in the usual sterile fashion using maximum barrier technique (cap and mask, sterile gown, sterile gloves, large sterile sheet, hand hygiene and cutaneous antiseptic). Local anesthesia was attained by infiltration with 1% lidocaine with epinephrine. Ultrasound demonstrated patency of the right internal jugular vein, and this was documented with an image. Under real-time ultrasound guidance, this vein was accessed with a 21 gauge micropuncture needle and image documentation was performed. A small dermatotomy was made at the access site with an 11 scalpel. A 0.018" wire was advanced into the SVC and the access needle exchanged for a 90F micropuncture vascular sheath. The 0.018" wire was then removed and a 0.035" wire advanced into the IVC. An appropriate location for the subcutaneous reservoir was selected below the clavicle and an incision was made through the skin and underlying soft tissues. The subcutaneous tissues were then dissected using a combination of blunt and sharp surgical technique and a pocket was formed. A single lumen power injectable portacatheter was then tunneled through the subcutaneous tissues  from the pocket to the dermatotomy and the port reservoir placed within the subcutaneous pocket. The venous access site was then serially dilated and a peel away vascular sheath placed over the wire. The wire was removed and the port catheter advanced into position under fluoroscopic guidance. The catheter tip is positioned in the superior cavoatrial junction. This was documented with a spot image. The portacatheter was then tested and found to flush and aspirate well. The port was flushed with saline followed by 100 units/mL heparinized saline. The pocket was then closed with subdermal inverted interrupted absorbable sutures. The epidermis was then sealed with Dermabond. The dermatotomy at the venous access site was also closed with Dermabond. IMPRESSION: Successful  placement of a right IJ approach Power Port with ultrasound and fluoroscopic guidance. The catheter is ready for use. Electronically Signed   By: Jacqulynn Cadet M.D.   On: 08/13/2021 10:08

## 2021-08-17 NOTE — Assessment & Plan Note (Signed)
It is understandable that she developed significant anxiety over her situation I recommend low-dose lorazepam to take as needed

## 2021-08-17 NOTE — Assessment & Plan Note (Signed)
She is aware that treatment goal is palliative but due to her young age, we will treat her as aggressive as possible and that is still a chance for complete remission with aggressive treatment We discussed the risk and benefits of her returning back to work

## 2021-08-17 NOTE — Assessment & Plan Note (Signed)
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

## 2021-08-17 NOTE — Assessment & Plan Note (Signed)
She is currently asymptomatic Observe closely

## 2021-08-18 ENCOUNTER — Encounter: Payer: Self-pay | Admitting: Hematology and Oncology

## 2021-08-18 ENCOUNTER — Inpatient Hospital Stay: Payer: BC Managed Care – PPO

## 2021-08-18 VITALS — BP 142/87 | HR 93 | Temp 98.6°F | Resp 16 | Wt 133.5 lb

## 2021-08-18 DIAGNOSIS — C55 Malignant neoplasm of uterus, part unspecified: Secondary | ICD-10-CM | POA: Diagnosis not present

## 2021-08-18 DIAGNOSIS — C7801 Secondary malignant neoplasm of right lung: Secondary | ICD-10-CM

## 2021-08-18 MED ORDER — HEPARIN SOD (PORK) LOCK FLUSH 100 UNIT/ML IV SOLN
500.0000 [IU] | Freq: Once | INTRAVENOUS | Status: AC | PRN
  Administered 2021-08-18: 500 [IU]

## 2021-08-18 MED ORDER — SODIUM CHLORIDE 0.9 % IV SOLN
150.0000 mg | Freq: Once | INTRAVENOUS | Status: AC
  Administered 2021-08-18: 150 mg via INTRAVENOUS
  Filled 2021-08-18: qty 150

## 2021-08-18 MED ORDER — SODIUM CHLORIDE 0.9% FLUSH
10.0000 mL | INTRAVENOUS | Status: DC | PRN
  Administered 2021-08-18: 10 mL

## 2021-08-18 MED ORDER — SODIUM CHLORIDE 0.9 % IV SOLN: Freq: Once | INTRAVENOUS | Status: AC

## 2021-08-18 MED ORDER — DOXORUBICIN HCL CHEMO IV INJECTION 2 MG/ML
130.0000 mg | Freq: Once | INTRAVENOUS | Status: AC
  Administered 2021-08-18: 130 mg via INTRAVENOUS
  Filled 2021-08-18: qty 65

## 2021-08-18 MED ORDER — SODIUM CHLORIDE 0.9 % IV SOLN
10.0000 mg | Freq: Once | INTRAVENOUS | Status: AC
  Administered 2021-08-18: 10 mg via INTRAVENOUS
  Filled 2021-08-18: qty 10

## 2021-08-18 MED ORDER — PALONOSETRON HCL INJECTION 0.25 MG/5ML
0.2500 mg | Freq: Once | INTRAVENOUS | Status: AC
  Administered 2021-08-18: 0.25 mg via INTRAVENOUS
  Filled 2021-08-18: qty 5

## 2021-08-18 NOTE — Patient Instructions (Signed)
[facility]   Discharge Instructions: Thank you for choosing [facility] to provide your oncology and hematology care.   If you have a lab appointment with the [facility], please go directly to the [facility] and check in at the registration area.   Wear comfortable clothing and clothing appropriate for easy access to any Portacath or PICC line.   We strive to give you quality time with your provider. You may need to reschedule your appointment if you arrive late (15 or more minutes).  Arriving late affects you and other patients whose appointments are after yours.  Also, if you miss three or more appointments without notifying the office, you may be dismissed from the clinic at the provider's discretion.      For prescription refill requests, have your pharmacy contact our office and allow 72 hours for refills to be completed.    Today you received the following chemotherapy and/or immunotherapy agents: doxorubicin.      To help prevent nausea and vomiting after your treatment, we encourage you to take your nausea medication as directed.  BELOW ARE SYMPTOMS THAT SHOULD BE REPORTED IMMEDIATELY: *FEVER GREATER THAN 100.4 F (38 C) OR HIGHER *CHILLS OR SWEATING *NAUSEA AND VOMITING THAT IS NOT CONTROLLED WITH YOUR NAUSEA MEDICATION *UNUSUAL SHORTNESS OF BREATH *UNUSUAL BRUISING OR BLEEDING *URINARY PROBLEMS (pain or burning when urinating, or frequent urination) *BOWEL PROBLEMS (unusual diarrhea, constipation, pain near the anus) TENDERNESS IN MOUTH AND THROAT WITH OR WITHOUT PRESENCE OF ULCERS (sore throat, sores in mouth, or a toothache) UNUSUAL RASH, SWELLING OR PAIN  UNUSUAL VAGINAL DISCHARGE OR ITCHING   Items with * indicate a potential emergency and should be followed up as soon as possible or go to the Emergency Department if any problems should occur.  Please show the CHEMOTHERAPY ALERT CARD or IMMUNOTHERAPY ALERT CARD at  check-in to the Emergency Department and triage nurse.  Should you have questions after your visit or need to cancel or reschedule your appointment, please contact [facility]  Dept: [phone]  and follow the prompts.  Office hours are 8:00 a.m. to 4:30 p.m. Monday - Friday. Please note that voicemails left after 4:00 p.m. may not be returned until the following business day.  We are closed weekends and major holidays. You have access to a nurse at all times for urgent questions. Please call the main number to the clinic Dept: [phone] and follow the prompts.   For any non-urgent questions, you may also contact your provider using MyChart. We now offer e-Visits for anyone 42 and older to request care online for non-urgent symptoms. For details visit [email or web address].   Also download the MyChart app! Go to the app store, search "MyChart", open the app, select [facility], and log in with your MyChart username and password.  Due to Covid, a mask is required upon entering the hospital/clinic. If you do not have a mask, one will be given to you upon arrival. For doctor visits, patients may have 1 support person aged 73 or older with them. For treatment visits, patients cannot have anyone with them due to current Covid guidelines and our immunocompromised population.    Doxorubicin injection What is this medication? DOXORUBICIN (dox oh ROO bi sin) is a chemotherapy drug. It is used to treat many kinds of cancer like leukemia, lymphoma, neuroblastoma, sarcoma, and Wilms' tumor. It is also used to treat bladder cancer, breast cancer, lung cancer, ovarian cancer,  stomach cancer, and thyroid cancer. This medicine may be used for other purposes; ask your health care provider or pharmacist if you have questions. COMMON BRAND NAME(S): Adriamycin, Adriamycin PFS, Adriamycin RDF, Rubex What should I tell my care team before I take this medication? They need to know if  you have any of these conditions: heart disease history of low blood counts caused by a medicine liver disease recent or ongoing radiation therapy an unusual or allergic reaction to doxorubicin, other chemotherapy agents, other medicines, foods, dyes, or preservatives pregnant or trying to get pregnant breast-feeding How should I use this medication? This drug is given as an infusion into a vein. It is administered in a hospital or clinic by a specially trained health care professional. If you have pain, swelling, burning or any unusual feeling around the site of your injection, tell your health care professional right away. Talk to your pediatrician regarding the use of this medicine in children. Special care may be needed. Overdosage: If you think you have taken too much of this medicine contact a poison control center or emergency room at once. NOTE: This medicine is only for you. Do not share this medicine with others. What if I miss a dose? It is important not to miss your dose. Call your doctor or health care professional if you are unable to keep an appointment. What may interact with this medication? This medicine may interact with the following medications: 6-mercaptopurine paclitaxel phenytoin St. John's Wort trastuzumab verapamil This list may not describe all possible interactions. Give your health care provider a list of all the medicines, herbs, non-prescription drugs, or dietary supplements you use. Also tell them if you smoke, drink alcohol, or use illegal drugs. Some items may interact with your medicine. What should I watch for while using this medication? This drug may make you feel generally unwell. This is not uncommon, as chemotherapy can affect healthy cells as well as cancer cells. Report any side effects. Continue your course of treatment even though you feel ill unless your doctor tells you to stop. There is a maximum amount of this medicine you should receive  throughout your life. The amount depends on the medical condition being treated and your overall health. Your doctor will watch how much of this medicine you receive in your lifetime. Tell your doctor if you have taken this medicine before. You may need blood work done while you are taking this medicine. Your urine may turn red for a few days after your dose. This is not blood. If your urine is dark or brown, call your doctor. In some cases, you may be given additional medicines to help with side effects. Follow all directions for their use. Call your doctor or health care professional for advice if you get a fever, chills or sore throat, or other symptoms of a cold or flu. Do not treat yourself. This drug decreases your body's ability to fight infections. Try to avoid being around people who are sick. This medicine may increase your risk to bruise or bleed. Call your doctor or health care professional if you notice any unusual bleeding. Talk to your doctor about your risk of cancer. You may be more at risk for certain types of cancers if you take this medicine. Do not become pregnant while taking this medicine or for 6 months after stopping it. Women should inform their doctor if they wish to become pregnant or think they might be pregnant. Men should not father a child while  taking this medicine and for 6 months after stopping it. There is a potential for serious side effects to an unborn child. Talk to your health care professional or pharmacist for more information. Do not breast-feed an infant while taking this medicine. This medicine has caused ovarian failure in some women and reduced sperm counts in some men This medicine may interfere with the ability to have a child. Talk with your doctor or health care professional if you are concerned about your fertility. This medicine may cause a decrease in Co-Enzyme Q-10. You should make sure that you get enough Co-Enzyme Q-10 while you are taking this  medicine. Discuss the foods you eat and the vitamins you take with your health care professional. What side effects may I notice from receiving this medication? Side effects that you should report to your doctor or health care professional as soon as possible: allergic reactions like skin rash, itching or hives, swelling of the face, lips, or tongue breathing problems chest pain fast or irregular heartbeat low blood counts - this medicine may decrease the number of white blood cells, red blood cells and platelets. You may be at increased risk for infections and bleeding. pain, redness, or irritation at site where injected signs of infection - fever or chills, cough, sore throat, pain or difficulty passing urine signs of decreased platelets or bleeding - bruising, pinpoint red spots on the skin, black, tarry stools, blood in the urine swelling of the ankles, feet, hands tiredness weakness Side effects that usually do not require medical attention (report to your doctor or health care professional if they continue or are bothersome): diarrhea hair loss mouth sores nail discoloration or damage nausea red colored urine vomiting This list may not describe all possible side effects. Call your doctor for medical advice about side effects. You may report side effects to FDA at 1-800-FDA-1088. Where should I keep my medication? This drug is given in a hospital or clinic and will not be stored at home. NOTE: This sheet is a summary. It may not cover all possible information. If you have questions about this medicine, talk to your doctor, pharmacist, or health care provider.  2022 Elsevier/Gold Standard (2017-06-02 00:00:00)

## 2021-09-02 ENCOUNTER — Encounter: Payer: Self-pay | Admitting: Hematology and Oncology

## 2021-09-02 ENCOUNTER — Telehealth: Payer: Self-pay | Admitting: *Deleted

## 2021-09-02 NOTE — Telephone Encounter (Signed)
Patient called. States has clear nasal drainage - she thinks it's allergies. Would like to know if ok to take Zyrtec? Also, her gums are puffy/swollen. She is using Biotene for dry mouth, but it does not help gums. Wants to know if she can use Peroxyl (has available). Dr. Alvy Bimler informed of message/questions. Contacted patient with Dr. Alvy Bimler response: Zyrtec ok for allergies. Use Salt/baking soda oral rinse for swollen gums. Gave patient instructions: 1 C warm water w/ 1/8 tsp salt & 1/8 tsp baking soda. Rinse, swish and spit after every meal and before bed. Advised patient if gum swelling does not improve to contact office. She verbalized understanding of all information and instructions provided.

## 2021-09-04 ENCOUNTER — Telehealth: Payer: Self-pay

## 2021-09-04 ENCOUNTER — Other Ambulatory Visit: Payer: Self-pay | Admitting: Hematology and Oncology

## 2021-09-04 DIAGNOSIS — N390 Urinary tract infection, site not specified: Secondary | ICD-10-CM | POA: Insufficient documentation

## 2021-09-04 MED ORDER — AMOXICILLIN 500 MG PO TABS: 500.0000 mg | ORAL_TABLET | Freq: Two times a day (BID) | ORAL | 0 refills | Status: DC

## 2021-09-04 NOTE — Telephone Encounter (Signed)
-----   Message from [name], MD sent at 09/04/2021  3:35 PM EST ----- Regarding: RE: New Symptoms and Questions I am sending prescription amoxicillin just in case it is an UTI  to her pharmacy Advise her I am ordering urinalysis and urine culture on Tuesday morning to just double check The appetite stimulant is more complex, maybe with antibiotics, she might feel better but if not, we can discuss next tuesday ----- Message ----- From: Jolaine Click, RN Sent: 09/04/2021   2:20 PM EST To: [name], MD Subject: New Symptoms and Questions                     Patient LVM stating that she was experiencing bladder urgency, mild discomfort after urinating, and inflammation and itchiness in her peri area. What would you advise? Patient also requesting advice to help stimulate her appetite - not sure if you have any specific recommendations before I call her back.

## 2021-09-04 NOTE — Telephone Encounter (Signed)
Returned patient's call regarding recent UTI-like symptoms. Patient informed that Dr. Alvy Bimler has prescribed amoxicillin and sent the prescription to her pharmacy. Patient also provided with education on management of these symptoms. Patient verbalized an understanding of this information. Patient also provided with education on maintaining adequate nutrition and managing a poor appetite. Patient knows that this will also be discussed at MD visit scheduled for Tuesday.  Patient appreciative of call. All questions answered. Patient knows to call triage line if she has any questions or concerns over the weekend.

## 2021-09-07 ENCOUNTER — Encounter: Payer: Self-pay | Admitting: Hematology and Oncology

## 2021-09-07 MED FILL — Dexamethasone Sodium Phosphate Inj 100 MG/10ML: INTRAMUSCULAR | Qty: 1 | Status: AC

## 2021-09-07 NOTE — Progress Notes (Signed)
Called pt to introduce myself as her Financial Resource Specialist and to discuss the [employer] grant.  Unfortunately there aren't any foundations offering copay assistance for her Dx and the type of ins she has.  I left a msg requesting she return my call if she's interested in applying for the grant.  

## 2021-09-08 ENCOUNTER — Other Ambulatory Visit: Payer: Self-pay | Admitting: Hematology and Oncology

## 2021-09-08 ENCOUNTER — Inpatient Hospital Stay: Payer: BC Managed Care – PPO

## 2021-09-08 ENCOUNTER — Other Ambulatory Visit: Payer: Self-pay

## 2021-09-08 ENCOUNTER — Inpatient Hospital Stay: Payer: BC Managed Care – PPO | Admitting: Hematology and Oncology

## 2021-09-08 DIAGNOSIS — C55 Malignant neoplasm of uterus, part unspecified: Secondary | ICD-10-CM

## 2021-09-08 DIAGNOSIS — N390 Urinary tract infection, site not specified: Secondary | ICD-10-CM

## 2021-09-08 DIAGNOSIS — C7801 Secondary malignant neoplasm of right lung: Secondary | ICD-10-CM | POA: Diagnosis not present

## 2021-09-08 DIAGNOSIS — C7802 Secondary malignant neoplasm of left lung: Secondary | ICD-10-CM

## 2021-09-08 DIAGNOSIS — K1231 Oral mucositis (ulcerative) due to antineoplastic therapy: Secondary | ICD-10-CM

## 2021-09-08 DIAGNOSIS — N3090 Cystitis, unspecified without hematuria: Secondary | ICD-10-CM | POA: Diagnosis not present

## 2021-09-08 LAB — CBC WITH DIFFERENTIAL (CANCER CENTER ONLY)
Abs Immature Granulocytes: 0.14 10*3/uL — ABNORMAL HIGH (ref 0.00–0.07)
Basophils Absolute: 0.1 10*3/uL (ref 0.0–0.1)
Basophils Relative: 1 %
Eosinophils Absolute: 0 10*3/uL (ref 0.0–0.5)
Eosinophils Relative: 0 %
HCT: 35.3 % — ABNORMAL LOW (ref 36.0–46.0)
Hemoglobin: 12 g/dL (ref 12.0–15.0)
Immature Granulocytes: 2 %
Lymphocytes Relative: 22 %
Lymphs Abs: 1.3 10*3/uL (ref 0.7–4.0)
MCH: 28.8 pg (ref 26.0–34.0)
MCHC: 34 g/dL (ref 30.0–36.0)
MCV: 84.9 fL (ref 80.0–100.0)
Monocytes Absolute: 0.7 10*3/uL (ref 0.1–1.0)
Monocytes Relative: 12 %
Neutro Abs: 3.9 10*3/uL (ref 1.7–7.7)
Neutrophils Relative %: 63 %
Platelet Count: 410 10*3/uL — ABNORMAL HIGH (ref 150–400)
RBC: 4.16 MIL/uL (ref 3.87–5.11)
RDW: 12.3 % (ref 11.5–15.5)
WBC Count: 6.1 10*3/uL (ref 4.0–10.5)
nRBC: 0 % (ref 0.0–0.2)

## 2021-09-08 LAB — URINALYSIS, COMPLETE (UACMP) WITH MICROSCOPIC
Bacteria, UA: NONE SEEN
Bilirubin Urine: NEGATIVE
Glucose, UA: NEGATIVE mg/dL
Hgb urine dipstick: NEGATIVE
Ketones, ur: NEGATIVE mg/dL
Leukocytes,Ua: NEGATIVE
Nitrite: NEGATIVE
Protein, ur: NEGATIVE mg/dL
Specific Gravity, Urine: 1.012 (ref 1.005–1.030)
pH: 7 (ref 5.0–8.0)

## 2021-09-08 LAB — CMP (CANCER CENTER ONLY)
ALT: 32 U/L (ref 0–44)
AST: 26 U/L (ref 15–41)
Albumin: 4 g/dL (ref 3.5–5.0)
Alkaline Phosphatase: 75 U/L (ref 38–126)
Anion gap: 7 (ref 5–15)
BUN: 13 mg/dL (ref 6–20)
CO2: 28 mmol/L (ref 22–32)
Calcium: 9 mg/dL (ref 8.9–10.3)
Chloride: 104 mmol/L (ref 98–111)
Creatinine: 0.72 mg/dL (ref 0.44–1.00)
GFR, Estimated: >60 mL/min (ref >60)
Glucose, Bld: 108 mg/dL — ABNORMAL HIGH (ref 70–99)
Potassium: 3.8 mmol/L (ref 3.5–5.1)
Sodium: 139 mmol/L (ref 135–145)
Total Bilirubin: 0.6 mg/dL (ref 0.3–1.2)
Total Protein: 7.1 g/dL (ref 6.5–8.1)

## 2021-09-08 MED ORDER — DEXAMETHASONE SODIUM PHOSPHATE 10 MG/ML IJ SOLN
5.0000 mg | Freq: Once | INTRAMUSCULAR | Status: AC
  Administered 2021-09-08: 5 mg via INTRAVENOUS

## 2021-09-08 MED ORDER — SODIUM CHLORIDE 0.9% FLUSH
10.0000 mL | INTRAVENOUS | Status: DC | PRN
  Administered 2021-09-08: 10 mL

## 2021-09-08 MED ORDER — HEPARIN SOD (PORK) LOCK FLUSH 100 UNIT/ML IV SOLN
500.0000 [IU] | Freq: Once | INTRAVENOUS | Status: AC | PRN
  Administered 2021-09-08: 500 [IU]

## 2021-09-08 MED ORDER — SODIUM CHLORIDE 0.9 % IV SOLN: 5.0000 mg | Freq: Once | INTRAVENOUS | Status: DC

## 2021-09-08 MED ORDER — DOXORUBICIN HCL CHEMO IV INJECTION 2 MG/ML
100.0000 mg | Freq: Once | INTRAVENOUS | Status: AC
  Administered 2021-09-08: 100 mg via INTRAVENOUS
  Filled 2021-09-08: qty 50

## 2021-09-08 MED ORDER — SODIUM CHLORIDE 0.9 % IV SOLN: Freq: Once | INTRAVENOUS | Status: AC

## 2021-09-08 MED ORDER — SODIUM CHLORIDE 0.9 % IV SOLN
150.0000 mg | Freq: Once | INTRAVENOUS | Status: AC
  Administered 2021-09-08: 150 mg via INTRAVENOUS
  Filled 2021-09-08: qty 150

## 2021-09-08 MED ORDER — PALONOSETRON HCL INJECTION 0.25 MG/5ML
0.2500 mg | Freq: Once | INTRAVENOUS | Status: AC
  Administered 2021-09-08: 0.25 mg via INTRAVENOUS

## 2021-09-08 MED ORDER — SODIUM CHLORIDE 0.9% FLUSH
10.0000 mL | Freq: Once | INTRAVENOUS | Status: AC
  Administered 2021-09-08: 10 mL

## 2021-09-08 NOTE — Patient Instructions (Signed)
[facility]   Discharge Instructions: Thank you for choosing [facility] to provide your oncology and hematology care.   If you have a lab appointment with the [facility], please go directly to the [facility] and check in at the registration area.   Wear comfortable clothing and clothing appropriate for easy access to any Portacath or PICC line.   We strive to give you quality time with your provider. You may need to reschedule your appointment if you arrive late (15 or more minutes).  Arriving late affects you and other patients whose appointments are after yours.  Also, if you miss three or more appointments without notifying the office, you may be dismissed from the clinic at the provider's discretion.      For prescription refill requests, have your pharmacy contact our office and allow 72 hours for refills to be completed.    Today you received the following chemotherapy and/or immunotherapy agents: doxorubicin.      To help prevent nausea and vomiting after your treatment, we encourage you to take your nausea medication as directed.  BELOW ARE SYMPTOMS THAT SHOULD BE REPORTED IMMEDIATELY: *FEVER GREATER THAN 100.4 F (38 C) OR HIGHER *CHILLS OR SWEATING *NAUSEA AND VOMITING THAT IS NOT CONTROLLED WITH YOUR NAUSEA MEDICATION *UNUSUAL SHORTNESS OF BREATH *UNUSUAL BRUISING OR BLEEDING *URINARY PROBLEMS (pain or burning when urinating, or frequent urination) *BOWEL PROBLEMS (unusual diarrhea, constipation, pain near the anus) TENDERNESS IN MOUTH AND THROAT WITH OR WITHOUT PRESENCE OF ULCERS (sore throat, sores in mouth, or a toothache) UNUSUAL RASH, SWELLING OR PAIN  UNUSUAL VAGINAL DISCHARGE OR ITCHING   Items with * indicate a potential emergency and should be followed up as soon as possible or go to the Emergency Department if any problems should occur.  Please show the CHEMOTHERAPY ALERT CARD or IMMUNOTHERAPY ALERT CARD at  check-in to the Emergency Department and triage nurse.  Should you have questions after your visit or need to cancel or reschedule your appointment, please contact [facility]  Dept: [phone]  and follow the prompts.  Office hours are 8:00 a.m. to 4:30 p.m. Monday - Friday. Please note that voicemails left after 4:00 p.m. may not be returned until the following business day.  We are closed weekends and major holidays. You have access to a nurse at all times for urgent questions. Please call the main number to the clinic Dept: [phone] and follow the prompts.   For any non-urgent questions, you may also contact your provider using MyChart. We now offer e-Visits for anyone 42 and older to request care online for non-urgent symptoms. For details visit [email or web address].   Also download the MyChart app! Go to the app store, search "MyChart", open the app, select [facility], and log in with your MyChart username and password.  Due to Covid, a mask is required upon entering the hospital/clinic. If you do not have a mask, one will be given to you upon arrival. For doctor visits, patients may have 1 support person aged 73 or older with them. For treatment visits, patients cannot have anyone with them due to current Covid guidelines and our immunocompromised population.    Doxorubicin injection What is this medication? DOXORUBICIN (dox oh ROO bi sin) is a chemotherapy drug. It is used to treat many kinds of cancer like leukemia, lymphoma, neuroblastoma, sarcoma, and Wilms' tumor. It is also used to treat bladder cancer, breast cancer, lung cancer, ovarian cancer,  stomach cancer, and thyroid cancer. This medicine may be used for other purposes; ask your health care provider or pharmacist if you have questions. COMMON BRAND NAME(S): Adriamycin, Adriamycin PFS, Adriamycin RDF, Rubex What should I tell my care team before I take this medication? They need to know if  you have any of these conditions: heart disease history of low blood counts caused by a medicine liver disease recent or ongoing radiation therapy an unusual or allergic reaction to doxorubicin, other chemotherapy agents, other medicines, foods, dyes, or preservatives pregnant or trying to get pregnant breast-feeding How should I use this medication? This drug is given as an infusion into a vein. It is administered in a hospital or clinic by a specially trained health care professional. If you have pain, swelling, burning or any unusual feeling around the site of your injection, tell your health care professional right away. Talk to your pediatrician regarding the use of this medicine in children. Special care may be needed. Overdosage: If you think you have taken too much of this medicine contact a poison control center or emergency room at once. NOTE: This medicine is only for you. Do not share this medicine with others. What if I miss a dose? It is important not to miss your dose. Call your doctor or health care professional if you are unable to keep an appointment. What may interact with this medication? This medicine may interact with the following medications: 6-mercaptopurine paclitaxel phenytoin St. John's Wort trastuzumab verapamil This list may not describe all possible interactions. Give your health care provider a list of all the medicines, herbs, non-prescription drugs, or dietary supplements you use. Also tell them if you smoke, drink alcohol, or use illegal drugs. Some items may interact with your medicine. What should I watch for while using this medication? This drug may make you feel generally unwell. This is not uncommon, as chemotherapy can affect healthy cells as well as cancer cells. Report any side effects. Continue your course of treatment even though you feel ill unless your doctor tells you to stop. There is a maximum amount of this medicine you should receive  throughout your life. The amount depends on the medical condition being treated and your overall health. Your doctor will watch how much of this medicine you receive in your lifetime. Tell your doctor if you have taken this medicine before. You may need blood work done while you are taking this medicine. Your urine may turn red for a few days after your dose. This is not blood. If your urine is dark or brown, call your doctor. In some cases, you may be given additional medicines to help with side effects. Follow all directions for their use. Call your doctor or health care professional for advice if you get a fever, chills or sore throat, or other symptoms of a cold or flu. Do not treat yourself. This drug decreases your body's ability to fight infections. Try to avoid being around people who are sick. This medicine may increase your risk to bruise or bleed. Call your doctor or health care professional if you notice any unusual bleeding. Talk to your doctor about your risk of cancer. You may be more at risk for certain types of cancers if you take this medicine. Do not become pregnant while taking this medicine or for 6 months after stopping it. Women should inform their doctor if they wish to become pregnant or think they might be pregnant. Men should not father a child while  taking this medicine and for 6 months after stopping it. There is a potential for serious side effects to an unborn child. Talk to your health care professional or pharmacist for more information. Do not breast-feed an infant while taking this medicine. This medicine has caused ovarian failure in some women and reduced sperm counts in some men This medicine may interfere with the ability to have a child. Talk with your doctor or health care professional if you are concerned about your fertility. This medicine may cause a decrease in Co-Enzyme Q-10. You should make sure that you get enough Co-Enzyme Q-10 while you are taking this  medicine. Discuss the foods you eat and the vitamins you take with your health care professional. What side effects may I notice from receiving this medication? Side effects that you should report to your doctor or health care professional as soon as possible: allergic reactions like skin rash, itching or hives, swelling of the face, lips, or tongue breathing problems chest pain fast or irregular heartbeat low blood counts - this medicine may decrease the number of white blood cells, red blood cells and platelets. You may be at increased risk for infections and bleeding. pain, redness, or irritation at site where injected signs of infection - fever or chills, cough, sore throat, pain or difficulty passing urine signs of decreased platelets or bleeding - bruising, pinpoint red spots on the skin, black, tarry stools, blood in the urine swelling of the ankles, feet, hands tiredness weakness Side effects that usually do not require medical attention (report to your doctor or health care professional if they continue or are bothersome): diarrhea hair loss mouth sores nail discoloration or damage nausea red colored urine vomiting This list may not describe all possible side effects. Call your doctor for medical advice about side effects. You may report side effects to FDA at 1-800-FDA-1088. Where should I keep my medication? This drug is given in a hospital or clinic and will not be stored at home. NOTE: This sheet is a summary. It may not cover all possible information. If you have questions about this medicine, talk to your doctor, pharmacist, or health care provider.  2022 Elsevier/Gold Standard (2017-06-02 00:00:00)

## 2021-09-09 ENCOUNTER — Encounter: Payer: Self-pay | Admitting: Hematology and Oncology

## 2021-09-09 DIAGNOSIS — K1231 Oral mucositis (ulcerative) due to antineoplastic therapy: Secondary | ICD-10-CM | POA: Insufficient documentation

## 2021-09-09 DIAGNOSIS — N3090 Cystitis, unspecified without hematuria: Secondary | ICD-10-CM | POA: Insufficient documentation

## 2021-09-09 LAB — URINE CULTURE

## 2021-09-09 NOTE — Assessment & Plan Note (Signed)
She is not symptomatic We will order CT imaging after cycle 3 of therapy

## 2021-09-09 NOTE — Assessment & Plan Note (Signed)
She has symptoms of cystitis Urinalysis is negative for She did complete a course of amoxicillin Urine culture is pending but I suspect will be negative I think the cause of her symptom is related to chemotherapy and I will modify the dose

## 2021-09-09 NOTE — Assessment & Plan Note (Signed)
We discussed conservative approach with salt water gargle with baking soda mixed We can also prescribe Magic mouthwash with lidocaine if needed

## 2021-09-09 NOTE — Progress Notes (Signed)
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[facility]) Her first cycle of treatment was complicated by mucositis and bladder irritation/cystitis which I suspect is related to her calculated dose being higher than she can tolerate We will proceed with treatment as schedule with minor dose adjustments, moving forward We discussed minimum 3 cycles of treatment before repeating CT imaging   Pulmonary metastases [facility]) She is not symptomatic We will order CT imaging after cycle 3 of therapy  Mucositis due to antineoplastic therapy We discussed conservative approach with salt water gargle with baking soda mixed We can also prescribe Magic mouthwash with lidocaine if needed  Cystitis She has symptoms of cystitis Urinalysis is negative for She did complete a course of amoxicillin Urine culture is pending but I suspect will be negative I think the cause of her symptom is related to chemotherapy and I will modify the dose  No orders of the defined types were placed in this encounter.   All questions were answered. The patient knows to call the clinic with any problems, questions or concerns. The total time spent in the appointment was 30 minutes encounter with patients including review of chart and various tests results, discussions about plan of care and coordination of care plan   [name], MD 09/09/2021 8:43 AM  INTERVAL HISTORY: Please see below for problem oriented charting. she returns for treatment follow-up seen prior to cycle 2 of chemotherapy She developed mucositis and cystitis She completed a course of amoxicillin and felt better No recent cough, chest pain or shortness of breath Denies recent nausea or changes in bowel habits  REVIEW OF SYSTEMS:   Constitutional: Denies fevers, chills or abnormal weight loss Eyes: Denies blurriness of  vision Respiratory: Denies cough, dyspnea or wheezes Cardiovascular: Denies palpitation, chest discomfort or lower extremity swelling Gastrointestinal:  Denies nausea, heartburn or change in bowel habits Skin: Denies abnormal skin rashes Lymphatics: Denies new lymphadenopathy or easy bruising Neurological:Denies numbness, tingling or new weaknesses Behavioral/Psych: Mood is stable, no new changes  All other systems were reviewed with the patient and are negative.  I have reviewed the past medical history, past surgical history, social history and family history with the patient and they are unchanged from previous note.  ALLERGIES:  is allergic to doxycycline.  MEDICATIONS:  Current Outpatient Medications  Medication Sig Dispense Refill   acetaminophen (TYLENOL) 500 MG tablet Take 1,000 mg by mouth every 6 (six) hours as needed for moderate pain or headache.     lidocaine-prilocaine (EMLA) cream Apply to affected area once 30 g 3   LORazepam (ATIVAN) 0.5 MG tablet Take 1 tablet (0.5 mg total) by mouth 2 (two) times daily as needed for anxiety. 30 tablet 0   ondansetron (ZOFRAN) 8 MG tablet Take 1 tablet (8 mg total) by mouth every 8 (eight) hours as needed. 30 tablet 1   prochlorperazine (COMPAZINE) 10 MG tablet Take 1 tablet (10 mg total) by mouth every 6 (six) hours as needed (Nausea or vomiting). 30 tablet 1   No current facility-administered medications for this visit.    SUMMARY OF ONCOLOGIC HISTORY: Oncology History  Uterine leiomyosarcoma ([place])  06/11/2021 Imaging   1. 9.5 x 7.6 x 9.0 cm complex, partially necrotic, mass involving the lower uterine segment/ cervix. No obvious direct extension into the parametrium.  2. 9 mm left pelvic sidewall lymph node is partially necrotic and  worrisome for metastatic adenopathy.  3. No findings for abdominal omental or peritoneal surface disease or adenopathy.  4. Tiny low-attenuation lesion in the pancreatic head, likely benign cyst but  attention on follow-up scans is suggested.  5. 2.9 cm fundal fibroid.    06/19/2021 Pathology Results   FINAL MICROSCOPIC DIAGNOSIS:   A. UTERINE, CERVICAL MASS, BIOPSY:  - Spindle cell malignancy.  - See comment.   COMMENT:  The biopsies consist of endocervical mucosa with stromal edema and one biopsy fragment has a microscopic focus with atypical spindle cells consistent with poorly differentiated malignancy.  The differential  includes a spindle cell malignancy such as sarcomatoid carcinoma and leiomyosarcoma.  Mullerian adenosarcoma is also a consideration but considered less likely   06/23/2021 Imaging   MR pelvis  10 cm uterine mass with central necrosis, which is centered in the cervix and lower uterine segment. Right parametrial involvement is seen as well as suspected invasion of the distal rectum. Differential diagnosis includes cervical carcinoma and uterine leiomyosarcoma.   Mild bilateral iliac lymphadenopathy, highly suspicious for metastatic disease.   2.9 cm subserosal fibroid in the posterior fundus.   Normal appearance of both ovaries.     06/29/2021 PET scan   1. Hypermetabolic necrotic cervical/uterine mass with bilateral external iliac hypermetabolic lymph nodes. No evidence of distant metastatic disease. 2. 1.5 cm low-attenuation left thyroid nodule. Recommend thyroid ultrasound. (Ref: J Am Coll Radiol. 2015 Feb;12(2): 143-50).   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, pleomorphic, and myxoid areas and associated necrosis (~20%) - Tumor based in cervix and also involves lower uterine segment - Cervicovaginal margin involved by focal invasive leiomyosarcoma (3:00-5:00, A10) as well as tumor in lymphovascular spaces - Leiomyosarcoma involves right and left parametrial tissue and extends  to parametrial margins - Extensive lymphovascular space invasion present, including in uterus and parametria - See synoptic report and comment   Other findings: - Leiomyomata with hyalinization, size up to 3.0 cm - Ovaries and fallopian tubes with no parenchymal involvement by leiomyosarcoma identified, although adnexal lymphovascular space invasion is present   B: Lymph nodes, right pelvic, lymphadenectomy - One of four lymph nodes positive for metastatic leiomyosarcoma (1/4), with extracapsular extension present   C: Lymph nodes, left pelvic, lymphadenectomy - One of four lymph nodes positive for metastatic leiomyosarcoma (1/4), with extracapsular extension present  Immunohistochemical stains are performed on block A11, and demonstrate that the tumor is positive for desmin and CD10, with SMA staining the majority of the spindle cell component but largely negative in the epithelioid / pleomorphic component. OSCAR, pancytokeratin AE1/AE3, HMB45, and PR appear negative in the tumor. ER shows patchy weak staining and myogenin stains rare cells. Block A23 also shows positive desmin and negative OSCAR pancytokeratin. Overall, the findings are most consistent with leiomyosarcoma, with extensive areas that are myxoid, epithelioid, and pleomorphic as well as more typical spindle cell areas within the overall high grade tumor (grade 3 / 3). The tumor is staged as pT2b (involves other pelvic tissues) given the parametrial involvement and pN1 for FIGO stage IIIC.    07/17/2021 Surgery   Date of Surgery: 07/17/21  Preoperative Diagnosis: High Grade Uterine Sarcoma  Postoperative Diagnosis: Same  Procedure(s): Bilateral - RADICAL ABDOMINAL HYSTER, W/BIL TOTAL PELVIC LYMPHADENECTOMY & PARA-AORTIC LYMPH NODE BX W/WO REM TUBE/OVAR VAGINAL HYSTERECTOMY, FOR UTERUS 250 G OR LESS; WITH REPAIR  OF ENTEROCELE COLECTOMY, PARTIAL; WITH COLOPROCTOSTOMY (LOW PELVIC ANASTOMOSIS) WITH COLOSTOMY CYSTOURETHROSCOPY,  WITH INSERTION OF INDWELLING URETERAL STENT (EG, GIBBONS OR DOUBLE-J TYPE) - Cystourethroscopy - Bilateral ureteral stent placement - Foley catheter placement  Performing Service: Gynecology Oncology Surgeon(s) and Role: Panel 1: * Lafonda Mosses, MD - Primary * Bernadene Bell, MD - Resident - Assisting * Devonne Doughty, MD - Resident - Assisting Panel 2: * Franchot Erichsen, MD - Primary  Drains:  - Left 6Fr open-ended ureteral access catheter (green) - Right 5Fr open-ended ureteral access catheter (white) - 16Fr foley catheter to drainage  * No implants in log *  Indications: 55 y.o. female with high grade uterine sarcoma. Urology was consulted pre-operatively for placement of bilateral ureteral stents. Risks, benefits, and alternatives of the above procedure were discussed and informed consent was signed.  OperativeFindings:  - Grossly distorted architecture of urinary bladder likely 2/2 pelvic mass with anterolaterally positioned UOs - Successful placement of bilateral open-ended ureteral catheters under direct visualization - Foley catheter placed at case conclusion  Description: The patient was correctly identified in the preop holding area where written informed consent as well potential risk and complication reviewed. She agreed. The patient was brought to the operative suite where a preinduction timeout was performed. Once correct information was verified, general anesthesia was induced. The patient was then gently placed into dorsal lithotomy position with SCDs in place for VTE prophylaxis. They were prepped and draped in the usual sterile fashion and given appropriate preoperative antibiotics. A second timeout was then performed.   We inserted a 55F rigid cystoscope per urethra with copious lubrication and normal saline irrigation running. We performed cystourethroscopy, which revealed the above findings.  We turned our attention to the left ureteral orifice and  canulated it with a sensor wire, using assistance of a 6Fr open-ended catheter. The wire was advanced into the renal pelvis without difficulty under visual guidance. We then advanced the stent over our wire into the renal pelvis under direct visualization and feel without complication. The wire was subsequently removed.   We then turned our attention to the right ureteral orifice and canulated it with a sensor wire, using assistance of a 5Fr open-ended catheter. The wire was advanced into the renal pelvis without difficulty under visual guidance. We then advanced the stent over our wire into the renal pelvis under direct visualization and feel without complication. The wire was subsequently removed.   A 16Fr straight catheter was placed, with return of urine indicating appropriate position within the bladder. The balloon was inflated with 10cc sterile water. The stents were secured to the Foley using 0-silk ties, being careful not to occlude the stents or Foley.   The patient was awoken from general anesthesia having tolerated the procedure well and taken to the PACU for routine post-operative recovery.  Post-Op Plan:  - Foley and stents per primary team    07/17/2021 Surgery   Date of Surgery: 07/17/2021  Pre-op Diagnosis: Uterine spindle cell malignancy  Post-op Diagnosis: Same  Procedure(s): Panel 1 RADICAL ABDOMINAL HYSTER, with bilateral S&O, vagineconty upper, bilateral pelvic lyphadenectomy, bilateral ureterolysis,: 73220 (CPT) Panel 2 CYSTOURETHROSCOPY, WITH INSERTION OF INDWELLING URETERAL STENT (EG, GIBBONS OR DOUBLE-J TYPE): 25427 (CPT) Note: Revisions to procedures should be made in chart - see Procedures activity.  Performing Service: Gynecology Oncology Surgeon(s) and Role: Panel 1: * Lafonda Mosses, MD - Primary * Bernadene Bell, MD - Resident - Assisting * Devonne Doughty, MD -  Resident - Assisting Panel 2: * Franchot Erichsen, MD - Primary  Findings: On  bimanual exam, 10cm necrotic mass filling upper vagina, unable to discretely palpate the cervix. On rectovaginal exam, rectal involvement not identified. Intraoperatively, normal upper abdominal survey including normal liver, diaphragm, stomach, omentum and bowel. Small uterus with 10cm mass expanding the cervix. Palpably enlarged bilateral pelvic lymph nodes adherent to the external iliac veins and obturator nerves, removed. No palpable para-aortic lymphadenopathy. No rectal involvement of uterine mass.   Specimens:  ID Type Source Tests Collected by Time Destination  1 : uterus,cervix,bilateral tubes/ovaries Tissue Uterus SURGICAL PATHOLOGY EXAM Lafonda Mosses, MD 07/17/2021 0932  2 : right pelvic lymph node Tissue Lymph Node SURGICAL PATHOLOGY EXAM Lafonda Mosses, MD 07/17/2021 1125  3 : LEFT PELVIC LN Tissue Lymph Node SURGICAL PATHOLOGY EXAM Lafonda Mosses, MD 07/17/2021 1144    08/06/2021 Initial Diagnosis   Uterine leiomyosarcoma ([place])   08/06/2021 Cancer Staging   Staging form: Corpus Uteri - Leiomyosarcoma and Endometrial Stromal Sarcoma, AJCC 8th Edition - Pathologic stage from 08/06/2021: FIGO Stage IVB (pT3, pN1, cM1) - Signed by [name], MD on 08/11/2021 Stage prefix: Initial diagnosis    08/10/2021 Imaging   CT abdomen and pelvis 1. Interval development of left lobe pulmonary nodules, measuring up to 7 mm and highly for metastatic disease. 2. Interval development of small to upper normal lymph nodes in the pelvis, concerning for metastatic disease. 3. Postoperative seroma left pelvic sidewall. 4. Tiny cluster of tree-in-bud opacity in the peripheral right lower lobe is new and compatible with sequelae of atypical infection.   08/13/2021 Procedure   Procedure: Placement of a right IJ approach single lumen PowerPort.  Tip is positioned at the superior cavoatrial junction and catheter is ready for immediate use.  Complications: No immediate   08/14/2021 Echocardiogram     1. Left ventricular ejection fraction, by estimation, is 60 to 65%. The left ventricle has normal function. The left ventricle has no regional wall motion abnormalities. Left ventricular diastolic parameters were normal. The average left ventricular global longitudinal strain is -17.4 %. The global longitudinal strain is normal.  2. Right ventricular systolic function is normal. The right ventricular size is normal.  3. The mitral valve is normal in structure. No evidence of mitral valve regurgitation. No evidence of mitral stenosis.  4. The aortic valve is tricuspid. Aortic valve regurgitation is not visualized. No aortic stenosis is present.  5. The inferior vena cava is normal in size with greater than 50% respiratory variability, suggesting right atrial pressure of 3 mmHg.     08/17/2021 Imaging   Multiple new and enlarging pulmonary nodules scattered throughout the lungs bilaterally, highly concerning for progressive metastatic disease to the lungs   08/18/2021 -  Chemotherapy   Patient is on Treatment Plan : UTERINE LEIOMYOSARCOMA Doxorubicin q21d x 6 Cycles     Pulmonary metastases ([place])  08/11/2021 Initial Diagnosis   Pulmonary metastases ([place])   08/18/2021 -  Chemotherapy   Patient is on Treatment Plan : UTERINE LEIOMYOSARCOMA Doxorubicin q21d x 6 Cycles       PHYSICAL EXAMINATION: ECOG PERFORMANCE STATUS: 1 - Symptomatic but completely ambulatory  Vitals:   09/08/21 1236  BP: 125/76  Pulse: 88  Resp: 18  Temp: 98.4 F (36.9 C)  SpO2: 100%   Filed Weights   09/08/21 1236  Weight: 133 lb 9.6 oz (60.6 kg)    GENERAL:alert, no distress and comfortable SKIN: skin color, texture, turgor are  normal, no rashes or significant lesions EYES: normal, Conjunctiva are pink and non-injected, sclera clear OROPHARYNX:no exudate, no erythema and lips, buccal mucosa, and tongue normal.  No evidence of canker sore or thrush NECK: supple, thyroid normal size, non-tender, without  nodularity LYMPH:  no palpable lymphadenopathy in the cervical, axillary or inguinal LUNGS: clear to auscultation and percussion with normal breathing effort HEART: regular rate & rhythm and no murmurs and no lower extremity edema ABDOMEN:abdomen soft, non-tender and normal bowel sounds Musculoskeletal:no cyanosis of digits and no clubbing  NEURO: alert & oriented x 3 with fluent speech, no focal motor/sensory deficits  LABORATORY DATA:  I have reviewed the data as listed    Component Value Date/Time   NA 139 09/08/2021 1211   K 3.8 09/08/2021 1211   CL 104 09/08/2021 1211   CO2 28 09/08/2021 1211   GLUCOSE 108 (H) 09/08/2021 1211   BUN 13 09/08/2021 1211   CREATININE 0.72 09/08/2021 1211   CALCIUM 9.0 09/08/2021 1211   PROT 7.1 09/08/2021 1211   ALBUMIN 4.0 09/08/2021 1211   AST 26 09/08/2021 1211   ALT 32 09/08/2021 1211   ALKPHOS 75 09/08/2021 1211   BILITOT 0.6 09/08/2021 1211   GFRNONAA >60 09/08/2021 1211    No results found for: SPEP, UPEP  Lab Results  Component Value Date   WBC 6.1 09/08/2021   NEUTROABS 3.9 09/08/2021   HGB 12.0 09/08/2021   HCT 35.3 (L) 09/08/2021   MCV 84.9 09/08/2021   PLT 410 (H) 09/08/2021      Chemistry      Component Value Date/Time   NA 139 09/08/2021 1211   K 3.8 09/08/2021 1211   CL 104 09/08/2021 1211   CO2 28 09/08/2021 1211   BUN 13 09/08/2021 1211   CREATININE 0.72 09/08/2021 1211      Component Value Date/Time   CALCIUM 9.0 09/08/2021 1211   ALKPHOS 75 09/08/2021 1211   AST 26 09/08/2021 1211   ALT 32 09/08/2021 1211   BILITOT 0.6 09/08/2021 1211

## 2021-09-09 NOTE — Assessment & Plan Note (Signed)
Her first cycle of treatment was complicated by mucositis and bladder irritation/cystitis which I suspect is related to her calculated dose being higher than she can tolerate We will proceed with treatment as schedule with minor dose adjustments, moving forward We discussed minimum 3 cycles of treatment before repeating CT imaging

## 2021-09-15 ENCOUNTER — Telehealth: Payer: Self-pay

## 2021-09-15 NOTE — Telephone Encounter (Signed)
She can use desitin only on the superficial area, nothing up her rectum I suggest trial of sitz bath and stool softener

## 2021-09-15 NOTE — Telephone Encounter (Signed)
Called and given below message. She verbalized understanding. Denies mucositis. She is eating small meals and drinking with no problems. Denies nausea.  Her only complaint is external hemorrhoid. She said that you suggested desitin cream but she is not sure? She is asking if she just apply it external or could she put it up rectum?

## 2021-09-15 NOTE — Telephone Encounter (Signed)
-----   Message from [name], MD sent at 09/15/2021 10:12 AM EST ----- Regarding: mucositis Can you call and check if she has mucositis?

## 2021-09-15 NOTE — Telephone Encounter (Signed)
Called and given below message. She verbalized understanding instructions and will call the office back if needed.

## 2021-09-23 ENCOUNTER — Encounter: Payer: Self-pay | Admitting: Hematology and Oncology

## 2021-09-29 ENCOUNTER — Other Ambulatory Visit: Payer: Self-pay

## 2021-09-29 ENCOUNTER — Encounter: Payer: Self-pay | Admitting: Hematology and Oncology

## 2021-09-29 ENCOUNTER — Inpatient Hospital Stay: Payer: BC Managed Care – PPO

## 2021-09-29 ENCOUNTER — Inpatient Hospital Stay: Payer: BC Managed Care – PPO | Admitting: Hematology and Oncology

## 2021-09-29 ENCOUNTER — Inpatient Hospital Stay: Payer: BC Managed Care – PPO | Attending: Gynecologic Oncology

## 2021-09-29 DIAGNOSIS — C7802 Secondary malignant neoplasm of left lung: Secondary | ICD-10-CM

## 2021-09-29 DIAGNOSIS — C7801 Secondary malignant neoplasm of right lung: Secondary | ICD-10-CM

## 2021-09-29 DIAGNOSIS — C55 Malignant neoplasm of uterus, part unspecified: Secondary | ICD-10-CM | POA: Diagnosis not present

## 2021-09-29 DIAGNOSIS — D701 Agranulocytosis secondary to cancer chemotherapy: Secondary | ICD-10-CM | POA: Insufficient documentation

## 2021-09-29 DIAGNOSIS — K1231 Oral mucositis (ulcerative) due to antineoplastic therapy: Secondary | ICD-10-CM | POA: Diagnosis not present

## 2021-09-29 DIAGNOSIS — T451X5A Adverse effect of antineoplastic and immunosuppressive drugs, initial encounter: Secondary | ICD-10-CM

## 2021-09-29 LAB — CBC WITH DIFFERENTIAL (CANCER CENTER ONLY)
Abs Immature Granulocytes: 0.07 10*3/uL (ref 0.00–0.07)
Basophils Absolute: 0 10*3/uL (ref 0.0–0.1)
Basophils Relative: 1 %
Eosinophils Absolute: 0 10*3/uL (ref 0.0–0.5)
Eosinophils Relative: 1 %
HCT: 37.9 % (ref 36.0–46.0)
Hemoglobin: 12.5 g/dL (ref 12.0–15.0)
Immature Granulocytes: 2 %
Lymphocytes Relative: 32 %
Lymphs Abs: 1.2 10*3/uL (ref 0.7–4.0)
MCH: 28.7 pg (ref 26.0–34.0)
MCHC: 33 g/dL (ref 30.0–36.0)
MCV: 86.9 fL (ref 80.0–100.0)
Monocytes Absolute: 0.6 10*3/uL (ref 0.1–1.0)
Monocytes Relative: 16 %
Neutro Abs: 1.8 10*3/uL (ref 1.7–7.7)
Neutrophils Relative %: 48 %
Platelet Count: 320 10*3/uL (ref 150–400)
RBC: 4.36 MIL/uL (ref 3.87–5.11)
RDW: 14.4 % (ref 11.5–15.5)
WBC Count: 3.7 10*3/uL — ABNORMAL LOW (ref 4.0–10.5)
nRBC: 0 % (ref 0.0–0.2)

## 2021-09-29 LAB — CMP (CANCER CENTER ONLY)
ALT: 25 U/L (ref 0–44)
AST: 21 U/L (ref 15–41)
Albumin: 4 g/dL (ref 3.5–5.0)
Alkaline Phosphatase: 72 U/L (ref 38–126)
Anion gap: 11 (ref 5–15)
BUN: 9 mg/dL (ref 6–20)
CO2: 25 mmol/L (ref 22–32)
Calcium: 9.2 mg/dL (ref 8.9–10.3)
Chloride: 107 mmol/L (ref 98–111)
Creatinine: 0.72 mg/dL (ref 0.44–1.00)
GFR, Estimated: >60 mL/min (ref >60)
Glucose, Bld: 119 mg/dL — ABNORMAL HIGH (ref 70–99)
Potassium: 3.8 mmol/L (ref 3.5–5.1)
Sodium: 143 mmol/L (ref 135–145)
Total Bilirubin: 0.3 mg/dL (ref 0.3–1.2)
Total Protein: 7 g/dL (ref 6.5–8.1)

## 2021-09-29 MED ORDER — SODIUM CHLORIDE 0.9% FLUSH
10.0000 mL | INTRAVENOUS | Status: DC | PRN
  Administered 2021-09-29: 15:00:00 10 mL

## 2021-09-29 MED ORDER — PALONOSETRON HCL INJECTION 0.25 MG/5ML
0.2500 mg | Freq: Once | INTRAVENOUS | Status: AC
  Administered 2021-09-29: 14:00:00 0.25 mg via INTRAVENOUS
  Filled 2021-09-29: qty 5

## 2021-09-29 MED ORDER — DEXAMETHASONE SODIUM PHOSPHATE 10 MG/ML IJ SOLN
5.0000 mg | Freq: Once | INTRAMUSCULAR | Status: AC
  Administered 2021-09-29: 14:00:00 5 mg via INTRAVENOUS
  Filled 2021-09-29: qty 1

## 2021-09-29 MED ORDER — HEPARIN SOD (PORK) LOCK FLUSH 100 UNIT/ML IV SOLN
500.0000 [IU] | Freq: Once | INTRAVENOUS | Status: AC | PRN
  Administered 2021-09-29: 15:00:00 500 [IU]

## 2021-09-29 MED ORDER — PROCHLORPERAZINE MALEATE 10 MG PO TABS: 10.0000 mg | ORAL_TABLET | Freq: Four times a day (QID) | ORAL | 1 refills | Status: DC | PRN

## 2021-09-29 MED ORDER — DOXORUBICIN HCL CHEMO IV INJECTION 2 MG/ML
60.0000 mg/m2 | Freq: Once | INTRAVENOUS | Status: AC
  Administered 2021-09-29: 15:00:00 100 mg via INTRAVENOUS
  Filled 2021-09-29: qty 50

## 2021-09-29 MED ORDER — SODIUM CHLORIDE 0.9% FLUSH
10.0000 mL | Freq: Once | INTRAVENOUS | Status: AC
  Administered 2021-09-29: 12:00:00 10 mL

## 2021-09-29 MED ORDER — SODIUM CHLORIDE 0.9 % IV SOLN: Freq: Once | INTRAVENOUS | Status: AC

## 2021-09-29 MED ORDER — SODIUM CHLORIDE 0.9 % IV SOLN
150.0000 mg | Freq: Once | INTRAVENOUS | Status: AC
  Administered 2021-09-29: 14:00:00 150 mg via INTRAVENOUS
  Filled 2021-09-29: qty 150

## 2021-09-29 NOTE — Progress Notes (Signed)
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) So far, she tolerated treatment well except for some mild nausea and hemorrhoid problem We will proceed with treatment without delay I recommend repeating imaging study mid January  Leukopenia due to antineoplastic chemotherapy [facility]) We will proceed with treatment without delay So far, she is not symptomatic I suspect her leukopenia is due to cumulative side effects from treatment and recent weight loss  Mucositis due to antineoplastic therapy She denies significant mucositis with recent treatment Observe closely  No orders of the defined types were placed in this encounter.   All questions were answered. The patient knows to call the clinic with any problems, questions or concerns. The total time spent in the appointment was 20 minutes encounter with patients including review of chart and various tests results, discussions about plan of care and coordination of care plan   [name], MD 09/29/2021 2:00 PM  INTERVAL HISTORY: Please see below for problem oriented charting. she returns for treatment follow-up seen prior to cycle 3 of doxorubicin She denies significant mucositis She has some mild nausea, well controlled with antiemetics She has some mild hemorrhoids No recent fever or chills  REVIEW OF SYSTEMS:   Constitutional: Denies fevers, chills or abnormal weight loss Eyes: Denies blurriness of vision Respiratory: Denies cough, dyspnea or wheezes Cardiovascular: Denies palpitation, chest discomfort or lower extremity swelling Skin: Denies abnormal skin rashes Lymphatics: Denies new lymphadenopathy or easy bruising Neurological:Denies numbness, tingling or new weaknesses Behavioral/Psych: Mood is stable, no new changes  All other systems  were reviewed with the patient and are negative.  I have reviewed the past medical history, past surgical history, social history and family history with the patient and they are unchanged from previous note.  ALLERGIES:  is allergic to doxycycline.  MEDICATIONS:  Current Outpatient Medications  Medication Sig Dispense Refill   acetaminophen (TYLENOL) 500 MG tablet Take 1,000 mg by mouth every 6 (six) hours as needed for moderate pain or headache.     lidocaine-prilocaine (EMLA) cream Apply to affected area once 30 g 3   LORazepam (ATIVAN) 0.5 MG tablet Take 1 tablet (0.5 mg total) by mouth 2 (two) times daily as needed for anxiety. 30 tablet 0   ondansetron (ZOFRAN) 8 MG tablet Take 1 tablet (8 mg total) by mouth every 8 (eight) hours as needed. 30 tablet 1   prochlorperazine (COMPAZINE) 10 MG tablet Take 1 tablet (10 mg total) by mouth every 6 (six) hours as needed (Nausea or vomiting). 90 tablet 1   No current facility-administered medications for this visit.   Facility-Administered Medications Ordered in Other Visits  Medication Dose Route Frequency Provider Last Rate Last Admin   DOXOrubicin (ADRIAMYCIN) chemo injection 100 mg  60 mg/m2 (Treatment Plan Recorded) Intravenous Once Alvy Bimler, Katelin Kutsch, MD       fosaprepitant (EMEND) 150 mg in sodium chloride 0.9 % 145 mL IVPB  150 mg Intravenous Once Alvy Bimler, Lonya Johannesen, MD 450 mL/hr at 09/29/21 1355 150 mg at 09/29/21 1355   heparin lock flush 100 unit/mL  500 Units Intracatheter Once PRN Alvy Bimler, Taren Dymek, MD       sodium chloride flush (NS) 0.9 % injection 10 mL  10 mL Intracatheter PRN [name], MD        SUMMARY OF ONCOLOGIC HISTORY: Oncology History  Uterine leiomyosarcoma (  [place])  06/11/2021 Imaging   1. 9.5 x 7.6 x 9.0 cm complex, partially necrotic, mass involving the lower uterine segment/ cervix. No obvious direct extension into the parametrium.  2. 9 mm left pelvic sidewall lymph node is partially necrotic and worrisome for metastatic  adenopathy.  3. No findings for abdominal omental or peritoneal surface disease or adenopathy.  4. Tiny low-attenuation lesion in the pancreatic head, likely benign cyst but attention on follow-up scans is suggested.  5. 2.9 cm fundal fibroid.    06/19/2021 Pathology Results   FINAL MICROSCOPIC DIAGNOSIS:   A. UTERINE, CERVICAL MASS, BIOPSY:  - Spindle cell malignancy.  - See comment.   COMMENT:  The biopsies consist of endocervical mucosa with stromal edema and one biopsy fragment has a microscopic focus with atypical spindle cells consistent with poorly differentiated malignancy.  The differential  includes a spindle cell malignancy such as sarcomatoid carcinoma and leiomyosarcoma.  Mullerian adenosarcoma is also a consideration but considered less likely   06/23/2021 Imaging   MR pelvis  10 cm uterine mass with central necrosis, which is centered in the cervix and lower uterine segment. Right parametrial involvement is seen as well as suspected invasion of the distal rectum. Differential diagnosis includes cervical carcinoma and uterine leiomyosarcoma.   Mild bilateral iliac lymphadenopathy, highly suspicious for metastatic disease.   2.9 cm subserosal fibroid in the posterior fundus.   Normal appearance of both ovaries.     06/29/2021 PET scan   1. Hypermetabolic necrotic cervical/uterine mass with bilateral external iliac hypermetabolic lymph nodes. No evidence of distant metastatic disease. 2. 1.5 cm low-attenuation left thyroid nodule. Recommend thyroid ultrasound. (Ref: J Am Coll Radiol. 2015 Feb;12(2): 143-50).   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, pleomorphic, and myxoid areas and associated necrosis (~20%) - Tumor based in cervix and also involves lower uterine segment - Cervicovaginal  margin involved by focal invasive leiomyosarcoma (3:00-5:00, A10) as well as tumor in lymphovascular spaces - Leiomyosarcoma involves right and left parametrial tissue and extends to parametrial margins - Extensive lymphovascular space invasion present, including in uterus and parametria - See synoptic report and comment   Other findings: - Leiomyomata with hyalinization, size up to 3.0 cm - Ovaries and fallopian tubes with no parenchymal involvement by leiomyosarcoma identified, although adnexal lymphovascular space invasion is present   B: Lymph nodes, right pelvic, lymphadenectomy - One of four lymph nodes positive for metastatic leiomyosarcoma (1/4), with extracapsular extension present   C: Lymph nodes, left pelvic, lymphadenectomy - One of four lymph nodes positive for metastatic leiomyosarcoma (1/4), with extracapsular extension present  Immunohistochemical stains are performed on block A11, and demonstrate that the tumor is positive for desmin and CD10, with SMA staining the majority of the spindle cell component but largely negative in the epithelioid / pleomorphic component. OSCAR, pancytokeratin AE1/AE3, HMB45, and PR appear negative in the tumor. ER shows patchy weak staining and myogenin stains rare cells. Block A23 also shows positive desmin and negative OSCAR pancytokeratin. Overall, the findings are most consistent with leiomyosarcoma, with extensive areas that are myxoid, epithelioid, and pleomorphic as well as more typical spindle cell areas within the overall high grade tumor (grade 3 / 3). The tumor is staged as pT2b (involves other pelvic tissues) given the parametrial involvement and pN1 for FIGO stage IIIC.    07/17/2021 Surgery   Date  of Surgery: 07/17/21  Preoperative Diagnosis: High Grade Uterine Sarcoma  Postoperative Diagnosis: Same  Procedure(s): Bilateral - RADICAL ABDOMINAL HYSTER, W/BIL TOTAL PELVIC LYMPHADENECTOMY & PARA-AORTIC LYMPH NODE BX W/WO REM  TUBE/OVAR VAGINAL HYSTERECTOMY, FOR UTERUS 250 G OR LESS; WITH REPAIR OF ENTEROCELE COLECTOMY, PARTIAL; WITH COLOPROCTOSTOMY (LOW PELVIC ANASTOMOSIS) WITH COLOSTOMY CYSTOURETHROSCOPY, WITH INSERTION OF INDWELLING URETERAL STENT (EG, GIBBONS OR DOUBLE-J TYPE) - Cystourethroscopy - Bilateral ureteral stent placement - Foley catheter placement  Performing Service: Gynecology Oncology Surgeon(s) and Role: Panel 1: * Lafonda Mosses, MD - Primary * Bernadene Bell, MD - Resident - Assisting * Devonne Doughty, MD - Resident - Assisting Panel 2: * Franchot Erichsen, MD - Primary  Drains:  - Left 6Fr open-ended ureteral access catheter (green) - Right 5Fr open-ended ureteral access catheter (white) - 16Fr foley catheter to drainage  * No implants in log *  Indications: 55 y.o. female with high grade uterine sarcoma. Urology was consulted pre-operatively for placement of bilateral ureteral stents. Risks, benefits, and alternatives of the above procedure were discussed and informed consent was signed.  OperativeFindings:  - Grossly distorted architecture of urinary bladder likely 2/2 pelvic mass with anterolaterally positioned UOs - Successful placement of bilateral open-ended ureteral catheters under direct visualization - Foley catheter placed at case conclusion  Description: The patient was correctly identified in the preop holding area where written informed consent as well potential risk and complication reviewed. She agreed. The patient was brought to the operative suite where a preinduction timeout was performed. Once correct information was verified, general anesthesia was induced. The patient was then gently placed into dorsal lithotomy position with SCDs in place for VTE prophylaxis. They were prepped and draped in the usual sterile fashion and given appropriate preoperative antibiotics. A second timeout was then performed.   We inserted a 56F rigid cystoscope per urethra  with copious lubrication and normal saline irrigation running. We performed cystourethroscopy, which revealed the above findings.  We turned our attention to the left ureteral orifice and canulated it with a sensor wire, using assistance of a 6Fr open-ended catheter. The wire was advanced into the renal pelvis without difficulty under visual guidance. We then advanced the stent over our wire into the renal pelvis under direct visualization and feel without complication. The wire was subsequently removed.   We then turned our attention to the right ureteral orifice and canulated it with a sensor wire, using assistance of a 5Fr open-ended catheter. The wire was advanced into the renal pelvis without difficulty under visual guidance. We then advanced the stent over our wire into the renal pelvis under direct visualization and feel without complication. The wire was subsequently removed.   A 16Fr straight catheter was placed, with return of urine indicating appropriate position within the bladder. The balloon was inflated with 10cc sterile water. The stents were secured to the Foley using 0-silk ties, being careful not to occlude the stents or Foley.   The patient was awoken from general anesthesia having tolerated the procedure well and taken to the PACU for routine post-operative recovery.  Post-Op Plan:  - Foley and stents per primary team    07/17/2021 Surgery   Date of Surgery: 07/17/2021  Pre-op Diagnosis: Uterine spindle cell malignancy  Post-op Diagnosis: Same  Procedure(s): Panel 1 RADICAL ABDOMINAL HYSTER, with bilateral S&O, vagineconty upper, bilateral pelvic lyphadenectomy, bilateral ureterolysis,: 82505 (CPT) Panel 2 CYSTOURETHROSCOPY, WITH INSERTION OF INDWELLING URETERAL STENT (EG, GIBBONS OR DOUBLE-J TYPE): 39767 (CPT) Note: Revisions  to procedures should be made in chart - see Procedures activity.  Performing Service: Gynecology Oncology Surgeon(s) and Role: Panel 1: *  Lafonda Mosses, MD - Primary * Bernadene Bell, MD - Resident - Assisting * Devonne Doughty, MD - Resident - Assisting Panel 2: * Franchot Erichsen, MD - Primary  Findings: On bimanual exam, 10cm necrotic mass filling upper vagina, unable to discretely palpate the cervix. On rectovaginal exam, rectal involvement not identified. Intraoperatively, normal upper abdominal survey including normal liver, diaphragm, stomach, omentum and bowel. Small uterus with 10cm mass expanding the cervix. Palpably enlarged bilateral pelvic lymph nodes adherent to the external iliac veins and obturator nerves, removed. No palpable para-aortic lymphadenopathy. No rectal involvement of uterine mass.   Specimens:  ID Type Source Tests Collected by Time Destination  1 : uterus,cervix,bilateral tubes/ovaries Tissue Uterus SURGICAL PATHOLOGY EXAM Lafonda Mosses, MD 07/17/2021 0932  2 : right pelvic lymph node Tissue Lymph Node SURGICAL PATHOLOGY EXAM Lafonda Mosses, MD 07/17/2021 1125  3 : LEFT PELVIC LN Tissue Lymph Node SURGICAL PATHOLOGY EXAM Lafonda Mosses, MD 07/17/2021 1144    08/06/2021 Initial Diagnosis   Uterine leiomyosarcoma ([place])   08/06/2021 Cancer Staging   Staging form: Corpus Uteri - Leiomyosarcoma and Endometrial Stromal Sarcoma, AJCC 8th Edition - Pathologic stage from 08/06/2021: FIGO Stage IVB (pT3, pN1, cM1) - Signed by [name], MD on 08/11/2021 Stage prefix: Initial diagnosis    08/10/2021 Imaging   CT abdomen and pelvis 1. Interval development of left lobe pulmonary nodules, measuring up to 7 mm and highly for metastatic disease. 2. Interval development of small to upper normal lymph nodes in the pelvis, concerning for metastatic disease. 3. Postoperative seroma left pelvic sidewall. 4. Tiny cluster of tree-in-bud opacity in the peripheral right lower lobe is new and compatible with sequelae of atypical infection.   08/13/2021 Procedure   Procedure: Placement of a  right IJ approach single lumen PowerPort.  Tip is positioned at the superior cavoatrial junction and catheter is ready for immediate use.  Complications: No immediate   08/14/2021 Echocardiogram    1. Left ventricular ejection fraction, by estimation, is 60 to 65%. The left ventricle has normal function. The left ventricle has no regional wall motion abnormalities. Left ventricular diastolic parameters were normal. The average left ventricular global longitudinal strain is -17.4 %. The global longitudinal strain is normal.  2. Right ventricular systolic function is normal. The right ventricular size is normal.  3. The mitral valve is normal in structure. No evidence of mitral valve regurgitation. No evidence of mitral stenosis.  4. The aortic valve is tricuspid. Aortic valve regurgitation is not visualized. No aortic stenosis is present.  5. The inferior vena cava is normal in size with greater than 50% respiratory variability, suggesting right atrial pressure of 3 mmHg.     08/17/2021 Imaging   Multiple new and enlarging pulmonary nodules scattered throughout the lungs bilaterally, highly concerning for progressive metastatic disease to the lungs   08/18/2021 -  Chemotherapy   Patient is on Treatment Plan : UTERINE LEIOMYOSARCOMA Doxorubicin q21d x 6 Cycles     Pulmonary metastases ([place])  08/11/2021 Initial Diagnosis   Pulmonary metastases ([place])   08/18/2021 -  Chemotherapy   Patient is on Treatment Plan : UTERINE LEIOMYOSARCOMA Doxorubicin q21d x 6 Cycles       PHYSICAL EXAMINATION: ECOG PERFORMANCE STATUS: 1 - Symptomatic but completely ambulatory  Vitals:   09/29/21 1241  BP: 129/90  Pulse:  97  Resp: 18  Temp: 98.7 F (37.1 C)  SpO2: 100%   Filed Weights   09/29/21 1241  Weight: 130 lb (59 kg)    GENERAL:alert, no distress and comfortable SKIN: skin color, texture, turgor are normal, no rashes or significant lesions EYES: normal, Conjunctiva are pink and non-injected,  sclera clear OROPHARYNX:no exudate, no erythema and lips, buccal mucosa, and tongue normal  NECK: supple, thyroid normal size, non-tender, without nodularity LYMPH:  no palpable lymphadenopathy in the cervical, axillary or inguinal LUNGS: clear to auscultation and percussion with normal breathing effort HEART: regular rate & rhythm and no murmurs and no lower extremity edema ABDOMEN:abdomen soft, non-tender and normal bowel sounds Musculoskeletal:no cyanosis of digits and no clubbing  NEURO: alert & oriented x 3 with fluent speech, no focal motor/sensory deficits  LABORATORY DATA:  I have reviewed the data as listed    Component Value Date/Time   NA 143 09/29/2021 1212   K 3.8 09/29/2021 1212   CL 107 09/29/2021 1212   CO2 25 09/29/2021 1212   GLUCOSE 119 (H) 09/29/2021 1212   BUN 9 09/29/2021 1212   CREATININE 0.72 09/29/2021 1212   CALCIUM 9.2 09/29/2021 1212   PROT 7.0 09/29/2021 1212   ALBUMIN 4.0 09/29/2021 1212   AST 21 09/29/2021 1212   ALT 25 09/29/2021 1212   ALKPHOS 72 09/29/2021 1212   BILITOT 0.3 09/29/2021 1212   GFRNONAA >60 09/29/2021 1212    No results found for: SPEP, UPEP  Lab Results  Component Value Date   WBC 3.7 (L) 09/29/2021   NEUTROABS 1.8 09/29/2021   HGB 12.5 09/29/2021   HCT 37.9 09/29/2021   MCV 86.9 09/29/2021   PLT 320 09/29/2021      Chemistry      Component Value Date/Time   NA 143 09/29/2021 1212   K 3.8 09/29/2021 1212   CL 107 09/29/2021 1212   CO2 25 09/29/2021 1212   BUN 9 09/29/2021 1212   CREATININE 0.72 09/29/2021 1212      Component Value Date/Time   CALCIUM 9.2 09/29/2021 1212   ALKPHOS 72 09/29/2021 1212   AST 21 09/29/2021 1212   ALT 25 09/29/2021 1212   BILITOT 0.3 09/29/2021 1212

## 2021-09-29 NOTE — Patient Instructions (Addendum)
[facility] MEDICAL ONCOLOGY  Discharge Instructions: ?Thank you for choosing [facility] to provide your oncology and hematology care.  ? ?If you have a lab appointment with the Cancer Center, please go directly to the Cancer Center and check in at the registration area. ?  ?Wear comfortable clothing and clothing appropriate for easy access to any Portacath or PICC line.  ? ?We strive to give you quality time with your provider. You may need to reschedule your appointment if you arrive late (15 or more minutes).  Arriving late affects you and other patients whose appointments are after yours.  Also, if you miss three or more appointments without notifying the office, you may be dismissed from the clinic at the provider?s discretion.    ?  ?For prescription refill requests, have your pharmacy contact our office and allow 72 hours for refills to be completed.   ? ?Today you received the following chemotherapy and/or immunotherapy agents: Doxorubicin.     ?  ?To help prevent nausea and vomiting after your treatment, we encourage you to take your nausea medication as directed. ? ?BELOW ARE SYMPTOMS THAT SHOULD BE REPORTED IMMEDIATELY: ?*FEVER GREATER THAN 100.4 F (38 ?C) OR HIGHER ?*CHILLS OR SWEATING ?*NAUSEA AND VOMITING THAT IS NOT CONTROLLED WITH YOUR NAUSEA MEDICATION ?*UNUSUAL SHORTNESS OF BREATH ?*UNUSUAL BRUISING OR BLEEDING ?*URINARY PROBLEMS (pain or burning when urinating, or frequent urination) ?*BOWEL PROBLEMS (unusual diarrhea, constipation, pain near the anus) ?TENDERNESS IN MOUTH AND THROAT WITH OR WITHOUT PRESENCE OF ULCERS (sore throat, sores in mouth, or a toothache) ?UNUSUAL RASH, SWELLING OR PAIN  ?UNUSUAL VAGINAL DISCHARGE OR ITCHING  ? ?Items with * indicate a potential emergency and should be followed up as soon as possible or go to the Emergency Department if any problems should occur. ? ?Please show the CHEMOTHERAPY ALERT CARD or IMMUNOTHERAPY ALERT CARD at check-in  to the Emergency Department and triage nurse. ? ?Should you have questions after your visit or need to cancel or reschedule your appointment, please contact [facility]ICAL [facility]COL[facility]  Dept: [phone]  and follow the prompts.  Office hours are 8:00 a.m. to 4:30 p.m. Monday - Friday. Please note that voicemails left after 4:00 p.m. may not be returned until the following business day.  We are closed weekends and major holidays. You have access to a nurse at all times for urgent questions. Please call the main number to the clinic Dept: [phone] and follow the prompts. ? ? ?For any non-urgent questions, you may also contact your provider using MyChart. We now offer e-Visits for anyone 18 and older to request care online for non-urgent symptoms. For details visit [email or web address]. ?  ?Also download the MyChart app! Go to the app store, search "MyChart", open the app, select [facility], and log in with your MyChart username and password. ? ?Due to Covid, a mask is required upon entering the hospital/clinic. If you do not have a mask, one will be given to you upon arrival. For doctor visits, patients may have 1 support person aged 18 or older with them. For treatment visits, patients cannot have anyone with them due to current Covid guidelines and our immunocompromised population.  ? ?

## 2021-09-29 NOTE — Assessment & Plan Note (Signed)
We will proceed with treatment without delay So far, she is not symptomatic I suspect her leukopenia is due to cumulative side effects from treatment and recent weight loss

## 2021-09-29 NOTE — Assessment & Plan Note (Signed)
So far, she tolerated treatment well except for some mild nausea and hemorrhoid problem We will proceed with treatment without delay I recommend repeating imaging study mid January

## 2021-09-29 NOTE — Assessment & Plan Note (Signed)
She denies significant mucositis with recent treatment Observe closely

## 2021-09-30 ENCOUNTER — Telehealth: Payer: Self-pay | Admitting: Hematology and Oncology

## 2021-09-30 NOTE — Telephone Encounter (Signed)
Scheduled per sch msg. Called and spoke with patient. Confirmed appt  

## 2021-10-19 MED FILL — Fosaprepitant Dimeglumine For IV Infusion 150 MG (Base Eq): INTRAVENOUS | Qty: 5 | Status: AC

## 2021-10-20 ENCOUNTER — Other Ambulatory Visit: Payer: Self-pay

## 2021-10-20 ENCOUNTER — Encounter: Payer: Self-pay | Admitting: Hematology and Oncology

## 2021-10-20 ENCOUNTER — Inpatient Hospital Stay: Payer: BC Managed Care – PPO

## 2021-10-20 ENCOUNTER — Inpatient Hospital Stay: Payer: BC Managed Care – PPO | Admitting: Hematology and Oncology

## 2021-10-20 ENCOUNTER — Inpatient Hospital Stay: Payer: BC Managed Care – PPO | Attending: Gynecologic Oncology

## 2021-10-20 VITALS — BP 124/79 | HR 97 | Temp 97.9°F | Resp 18 | Ht 68.0 in | Wt 129.6 lb

## 2021-10-20 DIAGNOSIS — C55 Malignant neoplasm of uterus, part unspecified: Secondary | ICD-10-CM

## 2021-10-20 DIAGNOSIS — C7801 Secondary malignant neoplasm of right lung: Secondary | ICD-10-CM

## 2021-10-20 DIAGNOSIS — Z79899 Other long term (current) drug therapy: Secondary | ICD-10-CM | POA: Diagnosis not present

## 2021-10-20 DIAGNOSIS — N390 Urinary tract infection, site not specified: Secondary | ICD-10-CM | POA: Insufficient documentation

## 2021-10-20 DIAGNOSIS — Z90710 Acquired absence of both cervix and uterus: Secondary | ICD-10-CM | POA: Diagnosis not present

## 2021-10-20 DIAGNOSIS — K648 Other hemorrhoids: Secondary | ICD-10-CM

## 2021-10-20 DIAGNOSIS — R110 Nausea: Secondary | ICD-10-CM | POA: Insufficient documentation

## 2021-10-20 DIAGNOSIS — D61818 Other pancytopenia: Secondary | ICD-10-CM | POA: Insufficient documentation

## 2021-10-20 DIAGNOSIS — Z5112 Encounter for antineoplastic immunotherapy: Secondary | ICD-10-CM | POA: Insufficient documentation

## 2021-10-20 DIAGNOSIS — Z5111 Encounter for antineoplastic chemotherapy: Secondary | ICD-10-CM | POA: Diagnosis present

## 2021-10-20 DIAGNOSIS — C7800 Secondary malignant neoplasm of unspecified lung: Secondary | ICD-10-CM | POA: Diagnosis not present

## 2021-10-20 DIAGNOSIS — C7802 Secondary malignant neoplasm of left lung: Secondary | ICD-10-CM

## 2021-10-20 LAB — CMP (CANCER CENTER ONLY)
ALT: 19 U/L (ref 0–44)
AST: 19 U/L (ref 15–41)
Albumin: 4.1 g/dL (ref 3.5–5.0)
Alkaline Phosphatase: 65 U/L (ref 38–126)
Anion gap: 6 (ref 5–15)
BUN: 12 mg/dL (ref 6–20)
CO2: 29 mmol/L (ref 22–32)
Calcium: 9.4 mg/dL (ref 8.9–10.3)
Chloride: 106 mmol/L (ref 98–111)
Creatinine: 0.66 mg/dL (ref 0.44–1.00)
GFR, Estimated: >60 mL/min (ref >60)
Glucose, Bld: 146 mg/dL — ABNORMAL HIGH (ref 70–99)
Potassium: 3.5 mmol/L (ref 3.5–5.1)
Sodium: 141 mmol/L (ref 135–145)
Total Bilirubin: 0.3 mg/dL (ref 0.3–1.2)
Total Protein: 6.7 g/dL (ref 6.5–8.1)

## 2021-10-20 LAB — CBC WITH DIFFERENTIAL (CANCER CENTER ONLY)
Abs Immature Granulocytes: 0.08 10*3/uL — ABNORMAL HIGH (ref 0.00–0.07)
Basophils Absolute: 0 10*3/uL (ref 0.0–0.1)
Basophils Relative: 1 %
Eosinophils Absolute: 0 10*3/uL (ref 0.0–0.5)
Eosinophils Relative: 1 %
HCT: 35.6 % — ABNORMAL LOW (ref 36.0–46.0)
Hemoglobin: 12.1 g/dL (ref 12.0–15.0)
Immature Granulocytes: 2 %
Lymphocytes Relative: 24 %
Lymphs Abs: 1.1 10*3/uL (ref 0.7–4.0)
MCH: 29.6 pg (ref 26.0–34.0)
MCHC: 34 g/dL (ref 30.0–36.0)
MCV: 87 fL (ref 80.0–100.0)
Monocytes Absolute: 0.7 10*3/uL (ref 0.1–1.0)
Monocytes Relative: 15 %
Neutro Abs: 2.6 10*3/uL (ref 1.7–7.7)
Neutrophils Relative %: 57 %
Platelet Count: 350 10*3/uL (ref 150–400)
RBC: 4.09 MIL/uL (ref 3.87–5.11)
RDW: 14.8 % (ref 11.5–15.5)
WBC Count: 4.4 10*3/uL (ref 4.0–10.5)
nRBC: 0 % (ref 0.0–0.2)

## 2021-10-20 MED ORDER — SODIUM CHLORIDE 0.9 % IV SOLN: Freq: Once | INTRAVENOUS | Status: AC

## 2021-10-20 MED ORDER — DOXORUBICIN HCL CHEMO IV INJECTION 2 MG/ML
60.0000 mg/m2 | Freq: Once | INTRAVENOUS | Status: AC
  Administered 2021-10-20: 100 mg via INTRAVENOUS
  Filled 2021-10-20: qty 50

## 2021-10-20 MED ORDER — PROCHLORPERAZINE MALEATE 10 MG PO TABS
10.0000 mg | ORAL_TABLET | Freq: Once | ORAL | Status: AC
  Administered 2021-10-20: 10 mg via ORAL
  Filled 2021-10-20: qty 1

## 2021-10-20 MED ORDER — DEXAMETHASONE SODIUM PHOSPHATE 10 MG/ML IJ SOLN
5.0000 mg | Freq: Once | INTRAMUSCULAR | Status: AC
  Administered 2021-10-20: 5 mg via INTRAVENOUS
  Filled 2021-10-20: qty 1

## 2021-10-20 MED ORDER — SODIUM CHLORIDE 0.9% FLUSH
10.0000 mL | INTRAVENOUS | Status: DC | PRN
  Administered 2021-10-20: 10 mL

## 2021-10-20 MED ORDER — PALONOSETRON HCL INJECTION 0.25 MG/5ML
0.2500 mg | Freq: Once | INTRAVENOUS | Status: AC
  Administered 2021-10-20: 0.25 mg via INTRAVENOUS
  Filled 2021-10-20: qty 5

## 2021-10-20 MED ORDER — HEPARIN SOD (PORK) LOCK FLUSH 100 UNIT/ML IV SOLN
500.0000 [IU] | Freq: Once | INTRAVENOUS | Status: AC | PRN
  Administered 2021-10-20: 500 [IU]

## 2021-10-20 MED ORDER — SODIUM CHLORIDE 0.9% FLUSH
10.0000 mL | Freq: Once | INTRAVENOUS | Status: AC
  Administered 2021-10-20: 10 mL

## 2021-10-20 MED ORDER — SODIUM CHLORIDE 0.9 % IV SOLN
150.0000 mg | Freq: Once | INTRAVENOUS | Status: AC
  Administered 2021-10-20: 150 mg via INTRAVENOUS
  Filled 2021-10-20: qty 150

## 2021-10-20 NOTE — Assessment & Plan Note (Signed)
She has recent irritation of internal hemorrhoids We discussed conservative approach

## 2021-10-20 NOTE — Progress Notes (Signed)
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    Answer:   [facility]    Order Specific Question:   Radiology Contrast Protocol - do NOT remove file path    Answer:   [email or web address]    Order Specific Question:   Is patient pregnant?    Answer:   No    All questions were answered. The patient knows to call the clinic with any problems, questions or concerns. The total time spent in the appointment was 20 minutes encounter with patients including review of chart and various tests results, discussions about plan of care and coordination of care plan   [name], MD 10/20/2021 3:08 PM  INTERVAL HISTORY: Please see below for problem oriented charting. she returns for treatment follow-up seen prior to chemotherapy with single agent doxorubicin for metastatic leiomyosarcoma She denies mucositis She has intermittent nausea without vomiting She is battling with problems related to internal hemorrhoids  REVIEW OF SYSTEMS:   Constitutional: Denies fevers, chills or abnormal weight loss Eyes: Denies blurriness of vision Ears, nose, mouth, throat, and face: Denies mucositis or sore throat Respiratory: Denies cough, dyspnea or  wheezes Cardiovascular: Denies palpitation, chest discomfort or lower extremity swelling Skin: Denies abnormal skin rashes Lymphatics: Denies new lymphadenopathy or easy bruising Neurological:Denies numbness, tingling or new weaknesses Behavioral/Psych: Mood is stable, no new changes  All other systems were reviewed with the patient and are negative.  I have reviewed the past medical history, past surgical history, social history and family history with the patient and they are unchanged from previous note.  ALLERGIES:  is allergic to doxycycline.  MEDICATIONS:  Current Outpatient Medications  Medication Sig Dispense Refill   acetaminophen (TYLENOL) 500 MG tablet Take 1,000 mg by mouth every 6 (six) hours as needed for moderate pain or headache.     lidocaine-prilocaine (EMLA) cream Apply to affected area once 30 g 3   LORazepam (ATIVAN) 0.5 MG tablet Take 1 tablet (0.5 mg total) by mouth 2 (two) times daily as needed for anxiety. 30 tablet 0   ondansetron (ZOFRAN) 8 MG tablet Take 1 tablet (8 mg total) by mouth every 8 (eight) hours as needed. 30 tablet 1   prochlorperazine (COMPAZINE) 10 MG tablet Take 1 tablet (10 mg total) by mouth every 6 (six) hours as needed (Nausea or vomiting). 90 tablet 1   No current facility-administered medications for this visit.   Facility-Administered Medications Ordered in Other Visits  Medication Dose Route Frequency Provider Last Rate Last Admin   heparin lock flush 100 unit/mL  500 Units Intracatheter Once PRN Alvy Bimler, Keah Lamba, MD       sodium chloride flush (NS) 0.9 % injection 10 mL  10 mL Intracatheter PRN [name], MD        SUMMARY OF ONCOLOGIC  HISTORY: Oncology History  Uterine leiomyosarcoma ([place])  06/11/2021 Imaging   1. 9.5 x 7.6 x 9.0 cm complex, partially necrotic, mass involving the lower uterine segment/ cervix. No obvious direct extension into the parametrium.  2. 9 mm left pelvic sidewall lymph node is partially necrotic and worrisome  for metastatic adenopathy.  3. No findings for abdominal omental or peritoneal surface disease or adenopathy.  4. Tiny low-attenuation lesion in the pancreatic head, likely benign cyst but attention on follow-up scans is suggested.  5. 2.9 cm fundal fibroid.    06/19/2021 Pathology Results   FINAL MICROSCOPIC DIAGNOSIS:   A. UTERINE, CERVICAL MASS, BIOPSY:  - Spindle cell malignancy.  - See comment.   COMMENT:  The biopsies consist of endocervical mucosa with stromal edema and one biopsy fragment has a microscopic focus with atypical spindle cells consistent with poorly differentiated malignancy.  The differential  includes a spindle cell malignancy such as sarcomatoid carcinoma and leiomyosarcoma.  Mullerian adenosarcoma is also a consideration but considered less likely   06/23/2021 Imaging   MR pelvis  10 cm uterine mass with central necrosis, which is centered in the cervix and lower uterine segment. Right parametrial involvement is seen as well as suspected invasion of the distal rectum. Differential diagnosis includes cervical carcinoma and uterine leiomyosarcoma.   Mild bilateral iliac lymphadenopathy, highly suspicious for metastatic disease.   2.9 cm subserosal fibroid in the posterior fundus.   Normal appearance of both ovaries.     06/29/2021 PET scan   1. Hypermetabolic necrotic cervical/uterine mass with bilateral external iliac hypermetabolic lymph nodes. No evidence of distant metastatic disease. 2. 1.5 cm low-attenuation left thyroid nodule. Recommend thyroid ultrasound. (Ref: J Am Coll Radiol. 2015 Feb;12(2): 143-50).   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, pleomorphic, and myxoid areas and associated necrosis (~20%) - Tumor based in cervix and also involves lower uterine segment -  Cervicovaginal margin involved by focal invasive leiomyosarcoma (3:00-5:00, A10) as well as tumor in lymphovascular spaces - Leiomyosarcoma involves right and left parametrial tissue and extends to parametrial margins - Extensive lymphovascular space invasion present, including in uterus and parametria - See synoptic report and comment   Other findings: - Leiomyomata with hyalinization, size up to 3.0 cm - Ovaries and fallopian tubes with no parenchymal involvement by leiomyosarcoma identified, although adnexal lymphovascular space invasion is present   B: Lymph nodes, right pelvic, lymphadenectomy - One of four lymph nodes positive for metastatic leiomyosarcoma (1/4), with extracapsular extension present   C: Lymph nodes, left pelvic, lymphadenectomy - One of four lymph nodes positive for metastatic leiomyosarcoma (1/4), with extracapsular extension present  Immunohistochemical stains are performed on block A11, and demonstrate that the tumor is positive for desmin and CD10, with SMA staining the majority of the spindle cell component but largely negative in the epithelioid / pleomorphic component. OSCAR, pancytokeratin AE1/AE3, HMB45, and PR appear negative in the tumor. ER shows patchy weak staining and myogenin stains rare cells. Block A23 also shows positive desmin and negative OSCAR pancytokeratin. Overall, the findings are most consistent with leiomyosarcoma, with extensive areas that are myxoid, epithelioid, and pleomorphic as well as more typical spindle cell areas within the overall high grade tumor (grade 3 / 3). The tumor is staged as pT2b (involves other pelvic tissues) given the parametrial involvement and pN1 for FIGO stage IIIC.  07/17/2021 Surgery   Date of Surgery: 07/17/21  Preoperative Diagnosis: High Grade Uterine Sarcoma  Postoperative Diagnosis: Same  Procedure(s): Bilateral - RADICAL ABDOMINAL HYSTER, W/BIL TOTAL PELVIC LYMPHADENECTOMY & PARA-AORTIC LYMPH NODE BX  W/WO REM TUBE/OVAR VAGINAL HYSTERECTOMY, FOR UTERUS 250 G OR LESS; WITH REPAIR OF ENTEROCELE COLECTOMY, PARTIAL; WITH COLOPROCTOSTOMY (LOW PELVIC ANASTOMOSIS) WITH COLOSTOMY CYSTOURETHROSCOPY, WITH INSERTION OF INDWELLING URETERAL STENT (EG, GIBBONS OR DOUBLE-J TYPE) - Cystourethroscopy - Bilateral ureteral stent placement - Foley catheter placement  Performing Service: Gynecology Oncology Surgeon(s) and Role: Panel 1: * Lafonda Mosses, MD - Primary * Bernadene Bell, MD - Resident - Assisting * Devonne Doughty, MD - Resident - Assisting Panel 2: * Franchot Erichsen, MD - Primary  Drains:  - Left 6Fr open-ended ureteral access catheter (green) - Right 5Fr open-ended ureteral access catheter (white) - 16Fr foley catheter to drainage  * No implants in log *  Indications: 56 y.o. female with high grade uterine sarcoma. Urology was consulted pre-operatively for placement of bilateral ureteral stents. Risks, benefits, and alternatives of the above procedure were discussed and informed consent was signed.  OperativeFindings:  - Grossly distorted architecture of urinary bladder likely 2/2 pelvic mass with anterolaterally positioned UOs - Successful placement of bilateral open-ended ureteral catheters under direct visualization - Foley catheter placed at case conclusion  Description: The patient was correctly identified in the preop holding area where written informed consent as well potential risk and complication reviewed. She agreed. The patient was brought to the operative suite where a preinduction timeout was performed. Once correct information was verified, general anesthesia was induced. The patient was then gently placed into dorsal lithotomy position with SCDs in place for VTE prophylaxis. They were prepped and draped in the usual sterile fashion and given appropriate preoperative antibiotics. A second timeout was then performed.   We inserted a 64F rigid cystoscope per  urethra with copious lubrication and normal saline irrigation running. We performed cystourethroscopy, which revealed the above findings.  We turned our attention to the left ureteral orifice and canulated it with a sensor wire, using assistance of a 6Fr open-ended catheter. The wire was advanced into the renal pelvis without difficulty under visual guidance. We then advanced the stent over our wire into the renal pelvis under direct visualization and feel without complication. The wire was subsequently removed.   We then turned our attention to the right ureteral orifice and canulated it with a sensor wire, using assistance of a 5Fr open-ended catheter. The wire was advanced into the renal pelvis without difficulty under visual guidance. We then advanced the stent over our wire into the renal pelvis under direct visualization and feel without complication. The wire was subsequently removed.   A 16Fr straight catheter was placed, with return of urine indicating appropriate position within the bladder. The balloon was inflated with 10cc sterile water. The stents were secured to the Foley using 0-silk ties, being careful not to occlude the stents or Foley.   The patient was awoken from general anesthesia having tolerated the procedure well and taken to the PACU for routine post-operative recovery.  Post-Op Plan:  - Foley and stents per primary team    07/17/2021 Surgery   Date of Surgery: 07/17/2021  Pre-op Diagnosis: Uterine spindle cell malignancy  Post-op Diagnosis: Same  Procedure(s): Panel 1 RADICAL ABDOMINAL HYSTER, with bilateral S&O, vagineconty upper, bilateral pelvic lyphadenectomy, bilateral ureterolysis,: 00174 (CPT) Panel 2 CYSTOURETHROSCOPY, WITH INSERTION OF INDWELLING URETERAL STENT (EG, GIBBONS OR DOUBLE-J  TYPE): 27035 (CPT) Note: Revisions to procedures should be made in chart - see Procedures activity.  Performing Service: Gynecology Oncology Surgeon(s) and Role: Panel  1: * Lafonda Mosses, MD - Primary * Bernadene Bell, MD - Resident - Assisting * Devonne Doughty, MD - Resident - Assisting Panel 2: * Franchot Erichsen, MD - Primary  Findings: On bimanual exam, 10cm necrotic mass filling upper vagina, unable to discretely palpate the cervix. On rectovaginal exam, rectal involvement not identified. Intraoperatively, normal upper abdominal survey including normal liver, diaphragm, stomach, omentum and bowel. Small uterus with 10cm mass expanding the cervix. Palpably enlarged bilateral pelvic lymph nodes adherent to the external iliac veins and obturator nerves, removed. No palpable para-aortic lymphadenopathy. No rectal involvement of uterine mass.   Specimens:  ID Type Source Tests Collected by Time Destination  1 : uterus,cervix,bilateral tubes/ovaries Tissue Uterus SURGICAL PATHOLOGY EXAM Lafonda Mosses, MD 07/17/2021 0932  2 : right pelvic lymph node Tissue Lymph Node SURGICAL PATHOLOGY EXAM Lafonda Mosses, MD 07/17/2021 1125  3 : LEFT PELVIC LN Tissue Lymph Node SURGICAL PATHOLOGY EXAM Lafonda Mosses, MD 07/17/2021 1144    08/06/2021 Initial Diagnosis   Uterine leiomyosarcoma ([place])   08/06/2021 Cancer Staging   Staging form: Corpus Uteri - Leiomyosarcoma and Endometrial Stromal Sarcoma, AJCC 8th Edition - Pathologic stage from 08/06/2021: FIGO Stage IVB (pT3, pN1, cM1) - Signed by [name], MD on 08/11/2021 Stage prefix: Initial diagnosis    08/10/2021 Imaging   CT abdomen and pelvis 1. Interval development of left lobe pulmonary nodules, measuring up to 7 mm and highly for metastatic disease. 2. Interval development of small to upper normal lymph nodes in the pelvis, concerning for metastatic disease. 3. Postoperative seroma left pelvic sidewall. 4. Tiny cluster of tree-in-bud opacity in the peripheral right lower lobe is new and compatible with sequelae of atypical infection.   08/13/2021 Procedure   Procedure: Placement of  a right IJ approach single lumen PowerPort.  Tip is positioned at the superior cavoatrial junction and catheter is ready for immediate use.  Complications: No immediate   08/14/2021 Echocardiogram    1. Left ventricular ejection fraction, by estimation, is 60 to 65%. The left ventricle has normal function. The left ventricle has no regional wall motion abnormalities. Left ventricular diastolic parameters were normal. The average left ventricular global longitudinal strain is -17.4 %. The global longitudinal strain is normal.  2. Right ventricular systolic function is normal. The right ventricular size is normal.  3. The mitral valve is normal in structure. No evidence of mitral valve regurgitation. No evidence of mitral stenosis.  4. The aortic valve is tricuspid. Aortic valve regurgitation is not visualized. No aortic stenosis is present.  5. The inferior vena cava is normal in size with greater than 50% respiratory variability, suggesting right atrial pressure of 3 mmHg.     08/17/2021 Imaging   Multiple new and enlarging pulmonary nodules scattered throughout the lungs bilaterally, highly concerning for progressive metastatic disease to the lungs   08/18/2021 -  Chemotherapy   Patient is on Treatment Plan : UTERINE LEIOMYOSARCOMA Doxorubicin q21d x 6 Cycles     Pulmonary metastases ([place])  08/11/2021 Initial Diagnosis   Pulmonary metastases ([place])   08/18/2021 -  Chemotherapy   Patient is on Treatment Plan : UTERINE LEIOMYOSARCOMA Doxorubicin q21d x 6 Cycles       PHYSICAL EXAMINATION: ECOG PERFORMANCE STATUS: 1 - Symptomatic but completely ambulatory  Vitals:   10/20/21 1229  BP: 124/79  Pulse: 97  Resp: 18  Temp: 97.9 F (36.6 C)  SpO2: 99%   Filed Weights   10/20/21 1229  Weight: 129 lb 9.6 oz (58.8 kg)    GENERAL:alert, no distress and comfortable NEURO: alert & oriented x 3 with fluent speech, no focal motor/sensory deficits  LABORATORY DATA:  I have reviewed the data  as listed    Component Value Date/Time   NA 141 10/20/2021 1224   K 3.5 10/20/2021 1224   CL 106 10/20/2021 1224   CO2 29 10/20/2021 1224   GLUCOSE 146 (H) 10/20/2021 1224   BUN 12 10/20/2021 1224   CREATININE 0.66 10/20/2021 1224   CALCIUM 9.4 10/20/2021 1224   PROT 6.7 10/20/2021 1224   ALBUMIN 4.1 10/20/2021 1224   AST 19 10/20/2021 1224   ALT 19 10/20/2021 1224   ALKPHOS 65 10/20/2021 1224   BILITOT 0.3 10/20/2021 1224   GFRNONAA >60 10/20/2021 1224    No results found for: SPEP, UPEP  Lab Results  Component Value Date   WBC 4.4 10/20/2021   NEUTROABS 2.6 10/20/2021   HGB 12.1 10/20/2021   HCT 35.6 (L) 10/20/2021   MCV 87.0 10/20/2021   PLT 350 10/20/2021      Chemistry      Component Value Date/Time   NA 141 10/20/2021 1224   K 3.5 10/20/2021 1224   CL 106 10/20/2021 1224   CO2 29 10/20/2021 1224   BUN 12 10/20/2021 1224   CREATININE 0.66 10/20/2021 1224      Component Value Date/Time   CALCIUM 9.4 10/20/2021 1224   ALKPHOS 65 10/20/2021 1224   AST 19 10/20/2021 1224   ALT 19 10/20/2021 1224   BILITOT 0.3 10/20/2021 1224

## 2021-10-20 NOTE — Assessment & Plan Note (Signed)
She will continue antiemetics

## 2021-10-20 NOTE — Assessment & Plan Note (Signed)
She tolerated treatment better with recent dose adjustment We will proceed with treatment without delay I plan to order CT imaging before her next visit

## 2021-11-06 ENCOUNTER — Other Ambulatory Visit: Payer: Self-pay | Admitting: Hematology

## 2021-11-06 ENCOUNTER — Ambulatory Visit (HOSPITAL_COMMUNITY)
Admission: RE | Admit: 2021-11-06 | Discharge: 2021-11-06 | Disposition: A | Discharge status: Home / Self Care | Payer: BC Managed Care – PPO | Source: Ambulatory Visit | Attending: Hematology and Oncology | Admitting: Hematology and Oncology

## 2021-11-06 ENCOUNTER — Other Ambulatory Visit: Payer: Self-pay

## 2021-11-06 ENCOUNTER — Inpatient Hospital Stay: Payer: BC Managed Care – PPO

## 2021-11-06 ENCOUNTER — Telehealth: Payer: Self-pay

## 2021-11-06 DIAGNOSIS — C7802 Secondary malignant neoplasm of left lung: Secondary | ICD-10-CM | POA: Diagnosis present

## 2021-11-06 DIAGNOSIS — C7801 Secondary malignant neoplasm of right lung: Secondary | ICD-10-CM | POA: Insufficient documentation

## 2021-11-06 DIAGNOSIS — C55 Malignant neoplasm of uterus, part unspecified: Secondary | ICD-10-CM | POA: Insufficient documentation

## 2021-11-06 DIAGNOSIS — R300 Dysuria: Secondary | ICD-10-CM

## 2021-11-06 LAB — CBC WITH DIFFERENTIAL (CANCER CENTER ONLY)
Abs Immature Granulocytes: 0.02 10*3/uL (ref 0.00–0.07)
Basophils Absolute: 0 10*3/uL (ref 0.0–0.1)
Basophils Relative: 1 %
Eosinophils Absolute: 0 10*3/uL (ref 0.0–0.5)
Eosinophils Relative: 1 %
HCT: 33.7 % — ABNORMAL LOW (ref 36.0–46.0)
Hemoglobin: 11.7 g/dL — ABNORMAL LOW (ref 12.0–15.0)
Immature Granulocytes: 1 %
Lymphocytes Relative: 30 %
Lymphs Abs: 0.7 10*3/uL (ref 0.7–4.0)
MCH: 30 pg (ref 26.0–34.0)
MCHC: 34.7 g/dL (ref 30.0–36.0)
MCV: 86.4 fL (ref 80.0–100.0)
Monocytes Absolute: 0.7 10*3/uL (ref 0.1–1.0)
Monocytes Relative: 31 %
Neutro Abs: 0.7 10*3/uL — ABNORMAL LOW (ref 1.7–7.7)
Neutrophils Relative %: 36 %
Platelet Count: 244 10*3/uL (ref 150–400)
RBC: 3.9 MIL/uL (ref 3.87–5.11)
RDW: 15.5 % (ref 11.5–15.5)
WBC Count: 2.1 10*3/uL — ABNORMAL LOW (ref 4.0–10.5)
nRBC: 0 % (ref 0.0–0.2)

## 2021-11-06 LAB — URINALYSIS, COMPLETE (UACMP) WITH MICROSCOPIC
Bilirubin Urine: NEGATIVE
Glucose, UA: NEGATIVE mg/dL
Ketones, ur: NEGATIVE mg/dL
Nitrite: POSITIVE — AB
Protein, ur: 100 mg/dL — AB
Specific Gravity, Urine: 1.005 (ref 1.005–1.030)
WBC, UA: >50 WBC/hpf — ABNORMAL HIGH (ref 0–5)
pH: 7 (ref 5.0–8.0)

## 2021-11-06 LAB — CMP (CANCER CENTER ONLY)
ALT: 19 U/L (ref 0–44)
AST: 17 U/L (ref 15–41)
Albumin: 4.2 g/dL (ref 3.5–5.0)
Alkaline Phosphatase: 69 U/L (ref 38–126)
Anion gap: 8 (ref 5–15)
BUN: 8 mg/dL (ref 6–20)
CO2: 28 mmol/L (ref 22–32)
Calcium: 9.3 mg/dL (ref 8.9–10.3)
Chloride: 104 mmol/L (ref 98–111)
Creatinine: 0.67 mg/dL (ref 0.44–1.00)
GFR, Estimated: >60 mL/min (ref >60)
Glucose, Bld: 118 mg/dL — ABNORMAL HIGH (ref 70–99)
Potassium: 3.5 mmol/L (ref 3.5–5.1)
Sodium: 140 mmol/L (ref 135–145)
Total Bilirubin: 0.5 mg/dL (ref 0.3–1.2)
Total Protein: 7 g/dL (ref 6.5–8.1)

## 2021-11-06 MED ORDER — NITROFURANTOIN MONOHYD MACRO 100 MG PO CAPS: 100.0000 mg | ORAL_CAPSULE | Freq: Two times a day (BID) | ORAL | 0 refills | Status: DC

## 2021-11-06 MED ORDER — SODIUM CHLORIDE 0.9% FLUSH
10.0000 mL | Freq: Once | INTRAVENOUS | Status: AC
  Administered 2021-11-06: 10 mL

## 2021-11-06 MED ORDER — IOHEXOL 9 MG/ML PO SOLN: 500.0000 mL | ORAL | Status: AC

## 2021-11-06 MED ORDER — IOHEXOL 9 MG/ML PO SOLN
ORAL | Status: AC
  Filled 2021-11-06: qty 1000

## 2021-11-06 MED ORDER — IOHEXOL 9 MG/ML PO SOLN
1000.0000 mL | ORAL | Status: DC
  Administered 2021-11-06: 1000 mL via ORAL

## 2021-11-06 MED ORDER — IOHEXOL 300 MG/ML  SOLN
100.0000 mL | Freq: Once | INTRAMUSCULAR | Status: AC | PRN
  Administered 2021-11-06: 100 mL via INTRAVENOUS

## 2021-11-06 MED ORDER — HEPARIN SOD (PORK) LOCK FLUSH 100 UNIT/ML IV SOLN
INTRAVENOUS | Status: AC
  Filled 2021-11-06: qty 5

## 2021-11-06 MED ORDER — HEPARIN SOD (PORK) LOCK FLUSH 100 UNIT/ML IV SOLN
500.0000 [IU] | Freq: Once | INTRAVENOUS | Status: AC
  Administered 2021-11-06: 500 [IU] via INTRAVENOUS

## 2021-11-06 MED ORDER — SODIUM CHLORIDE (PF) 0.9 % IJ SOLN
INTRAMUSCULAR | Status: AC
  Filled 2021-11-06: qty 50

## 2021-11-06 NOTE — Telephone Encounter (Signed)
Called and left a message. UA back and it is positive. Dr. Burr Medico sent to a Macrobid Rx to [facility] in [place], take BID per the instructions.  Ask her to call the office back for questions.

## 2021-11-06 NOTE — Telephone Encounter (Signed)
Returned her call. She is complaining like she feels that she has a UTI, with discomfort. She has been having chills. This all started yesterday. Temperature 99.5. She will take tylenol and drink fluids. Added lab/flush appt prior to CT today with UA/ urine culture. She is aware of appts.

## 2021-11-09 LAB — URINE CULTURE: Culture: >=100000 — AB

## 2021-11-09 MED FILL — Fosaprepitant Dimeglumine For IV Infusion 150 MG (Base Eq): INTRAVENOUS | Qty: 5 | Status: AC

## 2021-11-10 ENCOUNTER — Encounter: Payer: Self-pay | Admitting: Hematology and Oncology

## 2021-11-10 ENCOUNTER — Other Ambulatory Visit: Payer: Self-pay

## 2021-11-10 ENCOUNTER — Inpatient Hospital Stay: Payer: BC Managed Care – PPO

## 2021-11-10 ENCOUNTER — Inpatient Hospital Stay: Payer: BC Managed Care – PPO | Admitting: Hematology and Oncology

## 2021-11-10 VITALS — BP 134/90 | HR 91

## 2021-11-10 DIAGNOSIS — C7802 Secondary malignant neoplasm of left lung: Secondary | ICD-10-CM

## 2021-11-10 DIAGNOSIS — C7801 Secondary malignant neoplasm of right lung: Secondary | ICD-10-CM

## 2021-11-10 DIAGNOSIS — C55 Malignant neoplasm of uterus, part unspecified: Secondary | ICD-10-CM

## 2021-11-10 DIAGNOSIS — N390 Urinary tract infection, site not specified: Secondary | ICD-10-CM | POA: Diagnosis not present

## 2021-11-10 DIAGNOSIS — D61818 Other pancytopenia: Secondary | ICD-10-CM | POA: Diagnosis not present

## 2021-11-10 MED ORDER — SODIUM CHLORIDE 0.9 % IV SOLN: Freq: Once | INTRAVENOUS | Status: AC

## 2021-11-10 MED ORDER — DOXORUBICIN HCL CHEMO IV INJECTION 2 MG/ML
60.0000 mg/m2 | Freq: Once | INTRAVENOUS | Status: AC
  Administered 2021-11-10: 100 mg via INTRAVENOUS
  Filled 2021-11-10: qty 50

## 2021-11-10 MED ORDER — DEXAMETHASONE SODIUM PHOSPHATE 10 MG/ML IJ SOLN
5.0000 mg | Freq: Once | INTRAMUSCULAR | Status: AC
  Administered 2021-11-10: 5 mg via INTRAVENOUS
  Filled 2021-11-10: qty 1

## 2021-11-10 MED ORDER — HEPARIN SOD (PORK) LOCK FLUSH 100 UNIT/ML IV SOLN
500.0000 [IU] | Freq: Once | INTRAVENOUS | Status: AC | PRN
  Administered 2021-11-10: 500 [IU]

## 2021-11-10 MED ORDER — SODIUM CHLORIDE 0.9 % IV SOLN
150.0000 mg | Freq: Once | INTRAVENOUS | Status: AC
  Administered 2021-11-10: 150 mg via INTRAVENOUS
  Filled 2021-11-10: qty 150

## 2021-11-10 MED ORDER — PALONOSETRON HCL INJECTION 0.25 MG/5ML
0.2500 mg | Freq: Once | INTRAVENOUS | Status: AC
  Administered 2021-11-10: 0.25 mg via INTRAVENOUS
  Filled 2021-11-10: qty 5

## 2021-11-10 MED ORDER — SODIUM CHLORIDE 0.9% FLUSH
10.0000 mL | INTRAVENOUS | Status: DC | PRN
  Administered 2021-11-10: 10 mL

## 2021-11-10 MED ORDER — BISACODYL EC 5 MG PO TBEC: 5.0000 mg | DELAYED_RELEASE_TABLET | Freq: Every day | ORAL | 3 refills | Status: DC | PRN

## 2021-11-10 NOTE — Assessment & Plan Note (Signed)
I have reviewed multiple imaging studies with her and husband 60 of the measurable disease is developing central necrosis I recommend we proceed with a few more cycles of treatment with plan to repeat imaging studies in 2 months Due to worsening pancytopenia, I plan to delay next treatment by 3 days

## 2021-11-10 NOTE — Patient Instructions (Addendum)
[facility]  Discharge Instructions: Thank you for choosing [facility] to provide your oncology and hematology care.   If you have a lab appointment with the [facility], please go directly to the [facility] and check in at the registration area.   Wear comfortable clothing and clothing appropriate for easy access to any Portacath or PICC line.   We strive to give you quality time with your provider. You may need to reschedule your appointment if you arrive late (15 or more minutes).  Arriving late affects you and other patients whose appointments are after yours.  Also, if you miss three or more appointments without notifying the office, you may be dismissed from the clinic at the providers discretion.      For prescription refill requests, have your pharmacy contact our office and allow 72 hours for refills to be completed.    Today you received the following chemotherapy and/or immunotherapy agent: Doxorubicin (Adriamycin).   To help prevent nausea and vomiting after your treatment, we encourage you to take your nausea medication as directed.  BELOW ARE SYMPTOMS THAT SHOULD BE REPORTED IMMEDIATELY: *FEVER GREATER THAN 100.4 F (38 C) OR HIGHER *CHILLS OR SWEATING *NAUSEA AND VOMITING THAT IS NOT CONTROLLED WITH YOUR NAUSEA MEDICATION *UNUSUAL SHORTNESS OF BREATH *UNUSUAL BRUISING OR BLEEDING *URINARY PROBLEMS (pain or burning when urinating, or frequent urination) *BOWEL PROBLEMS (unusual diarrhea, constipation, pain near the anus) TENDERNESS IN MOUTH AND THROAT WITH OR WITHOUT PRESENCE OF ULCERS (sore throat, sores in mouth, or a toothache) UNUSUAL RASH, SWELLING OR PAIN  UNUSUAL VAGINAL DISCHARGE OR ITCHING   Items with * indicate a potential emergency and should be followed up as soon as possible or go to the Emergency Department if any problems should occur.  Please show the CHEMOTHERAPY ALERT CARD or IMMUNOTHERAPY ALERT CARD at  check-in to the Emergency Department and triage nurse.  Should you have questions after your visit or need to cancel or reschedule your appointment, please contact [facility]  Dept: [phone]  and follow the prompts.  Office hours are 8:00 a.m. to 4:30 p.m. Monday - Friday. Please note that voicemails left after 4:00 p.m. may not be returned until the following business day.  We are closed weekends and major holidays. You have access to a nurse at all times for urgent questions. Please call the main number to the clinic Dept: [phone] and follow the prompts.   For any non-urgent questions, you may also contact your provider using MyChart. We now offer e-Visits for anyone 54 and older to request care online for non-urgent symptoms. For details visit [email or web address].   Also download the MyChart app! Go to the app store, search "MyChart", open the app, select [facility], and log in with your MyChart username and password.  Due to Covid, a mask is required upon entering the hospital/clinic. If you do not have a mask, one will be given to you upon arrival. For doctor visits, patients may have 1 support person aged 29 or older with them. For treatment visits, patients cannot have anyone with them due to current Covid guidelines and our immunocompromised population.

## 2021-11-10 NOTE — Assessment & Plan Note (Addendum)
Majority of the lung metastases are stable in size except for the right hilar node On review of the imaging, the right hilum appeared necrotic Overall, I do not believe the patient has progressed My recommendation would be to proceed with 3 more cycles of treatment and repeat imaging study and she is in agreement

## 2021-11-11 ENCOUNTER — Encounter: Payer: Self-pay | Admitting: Hematology and Oncology

## 2021-11-11 ENCOUNTER — Telehealth: Payer: Self-pay

## 2021-11-11 DIAGNOSIS — D61818 Other pancytopenia: Secondary | ICD-10-CM | POA: Insufficient documentation

## 2021-11-11 NOTE — Assessment & Plan Note (Signed)
She had recent pancytopenia due to treatment She does not need G-CSF support I recommend delaying the next treatment by 3 days but to proceed without delay and she is in agreement

## 2021-11-11 NOTE — Assessment & Plan Note (Signed)
Urinary tract infection is resolving She will complete her course of antibiotics

## 2021-11-11 NOTE — Progress Notes (Signed)
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[facility]) I have reviewed multiple imaging studies with her and husband 3 of the measurable disease is developing central necrosis I recommend we proceed with a few more cycles of treatment with plan to repeat imaging studies in 2 months Due to worsening pancytopenia, I plan to delay next treatment by 3 days  Pulmonary metastases ([place]) Majority of the lung metastases are stable in size except for the right hilar node On review of the imaging, the right hilum appeared necrotic Overall, I do not believe the patient has progressed My recommendation would be to proceed with 3 more cycles of treatment and repeat imaging study and she is in agreement  Pancytopenia, acquired [facility]) She had recent pancytopenia due to treatment She does not need G-CSF support I recommend delaying the next treatment by 3 days but to proceed without delay and she is in agreement  UTI (urinary tract infection) Urinary tract infection is resolving She will complete her course of antibiotics  Orders Placed This Encounter  Procedures   ECHOCARDIOGRAM COMPLETE    Standing Status:   Future    Standing Expiration Date:   11/11/2022    Order Specific Question:   Where should this test be performed    Answer:   [facility]    Order Specific Question:   Perflutren DEFINITY (image enhancing agent) should be administered unless hypersensitivity or allergy exist    Answer:   Administer Perflutren    Order Specific Question:   Reason for exam-Echo    Answer:   Chemo  [id number]    All questions were answered. The patient knows to call the clinic with any problems, questions or concerns. The total time spent in the appointment was 40 minutes encounter with patients including review of chart and various  tests results, discussions about plan of care and coordination of care plan   [name], MD 11/11/2021 9:43 AM  INTERVAL HISTORY: Please see below for problem oriented charting. she returns for treatment follow-up and review of test results She tolerated last cycle well except for mild fatigue Denies nausea, vomiting or significant mucositis She had some recent bladder discomfort and was found to have urinary tract infection but she is better with antibiotics  REVIEW OF SYSTEMS:   Constitutional: Denies fevers, chills or abnormal weight loss Eyes: Denies blurriness of vision Ears, nose, mouth, throat, and face: Denies mucositis or sore throat Respiratory: Denies cough, dyspnea or wheezes Cardiovascular: Denies palpitation, chest discomfort or lower extremity swelling Gastrointestinal:  Denies nausea, heartburn or change in bowel habits Skin: Denies abnormal skin rashes Lymphatics: Denies new lymphadenopathy or easy bruising Neurological:Denies numbness, tingling or new weaknesses Behavioral/Psych: Mood is stable, no new changes  All other systems were reviewed with the patient and are negative.  I have reviewed the past medical history, past surgical history, social history and family history with the patient and they are unchanged from previous note.  ALLERGIES:  is allergic to doxycycline.  MEDICATIONS:  Current Outpatient Medications  Medication Sig Dispense Refill   bisacodyl 5 MG EC tablet Take 1 tablet (5 mg total) by mouth daily as needed for moderate constipation. 30 tablet 3   acetaminophen (TYLENOL) 500 MG tablet Take 1,000 mg by mouth every 6 (six) hours as needed for moderate pain or headache.  lidocaine-prilocaine (EMLA) cream Apply to affected area once 30 g 3   LORazepam (ATIVAN) 0.5 MG tablet Take 1 tablet (0.5 mg total) by mouth 2 (two) times daily as needed for anxiety. 30 tablet 0   nitrofurantoin, macrocrystal-monohydrate, (MACROBID) 100 MG capsule Take 1  capsule (100 mg total) by mouth 2 (two) times daily. 10 capsule 0   ondansetron (ZOFRAN) 8 MG tablet Take 1 tablet (8 mg total) by mouth every 8 (eight) hours as needed. 30 tablet 1   prochlorperazine (COMPAZINE) 10 MG tablet Take 1 tablet (10 mg total) by mouth every 6 (six) hours as needed (Nausea or vomiting). 90 tablet 1   No current facility-administered medications for this visit.    SUMMARY OF ONCOLOGIC HISTORY: Oncology History  Uterine leiomyosarcoma ([place])  06/11/2021 Imaging   1. 9.5 x 7.6 x 9.0 cm complex, partially necrotic, mass involving the lower uterine segment/ cervix. No obvious direct extension into the parametrium.  2. 9 mm left pelvic sidewall lymph node is partially necrotic and worrisome for metastatic adenopathy.  3. No findings for abdominal omental or peritoneal surface disease or adenopathy.  4. Tiny low-attenuation lesion in the pancreatic head, likely benign cyst but attention on follow-up scans is suggested.  5. 2.9 cm fundal fibroid.    06/19/2021 Pathology Results   FINAL MICROSCOPIC DIAGNOSIS:   A. UTERINE, CERVICAL MASS, BIOPSY:  - Spindle cell malignancy.  - See comment.   COMMENT:  The biopsies consist of endocervical mucosa with stromal edema and one biopsy fragment has a microscopic focus with atypical spindle cells consistent with poorly differentiated malignancy.  The differential  includes a spindle cell malignancy such as sarcomatoid carcinoma and leiomyosarcoma.  Mullerian adenosarcoma is also a consideration but considered less likely   06/23/2021 Imaging   MR pelvis  10 cm uterine mass with central necrosis, which is centered in the cervix and lower uterine segment. Right parametrial involvement is seen as well as suspected invasion of the distal rectum. Differential diagnosis includes cervical carcinoma and uterine leiomyosarcoma.   Mild bilateral iliac lymphadenopathy, highly suspicious for metastatic disease.   2.9 cm subserosal fibroid in  the posterior fundus.   Normal appearance of both ovaries.     06/29/2021 PET scan   1. Hypermetabolic necrotic cervical/uterine mass with bilateral external iliac hypermetabolic lymph nodes. No evidence of distant metastatic disease. 2. 1.5 cm low-attenuation left thyroid nodule. Recommend thyroid ultrasound. (Ref: J Am Coll Radiol. 2015 Feb;12(2): 143-50).   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, pleomorphic, and myxoid areas and associated necrosis (~20%) - Tumor based in cervix and also involves lower uterine segment - Cervicovaginal margin involved by focal invasive leiomyosarcoma (3:00-5:00, A10) as well as tumor in lymphovascular spaces - Leiomyosarcoma involves right and left parametrial tissue and extends to parametrial margins - Extensive lymphovascular space invasion present, including in uterus and parametria - See synoptic report and comment   Other findings: - Leiomyomata with hyalinization, size up to 3.0 cm - Ovaries and fallopian tubes with no parenchymal involvement by leiomyosarcoma identified, although adnexal lymphovascular space invasion is present   B: Lymph nodes, right pelvic, lymphadenectomy - One of four lymph nodes positive for metastatic leiomyosarcoma (1/4), with extracapsular extension present   C: Lymph nodes, left pelvic, lymphadenectomy - One of four lymph nodes positive for metastatic leiomyosarcoma (1/4), with extracapsular extension present  Immunohistochemical stains are performed on block A11, and demonstrate that the tumor is positive for desmin and CD10, with SMA staining the majority of the spindle cell component but largely negative in the epithelioid / pleomorphic component. OSCAR, pancytokeratin AE1/AE3, HMB45, and PR appear negative in the tumor. ER shows patchy  weak staining and myogenin stains rare cells. Block A23 also shows positive desmin and negative OSCAR pancytokeratin. Overall, the findings are most consistent with leiomyosarcoma, with extensive areas that are myxoid, epithelioid, and pleomorphic as well as more typical spindle cell areas within the overall high grade tumor (grade 3 / 3). The tumor is staged as pT2b (involves other pelvic tissues) given the parametrial involvement and pN1 for FIGO stage IIIC.    07/17/2021 Surgery   Date of Surgery: 07/17/21  Preoperative Diagnosis: High Grade Uterine Sarcoma  Postoperative Diagnosis: Same  Procedure(s): Bilateral - RADICAL ABDOMINAL HYSTER, W/BIL TOTAL PELVIC LYMPHADENECTOMY & PARA-AORTIC LYMPH NODE BX W/WO REM TUBE/OVAR VAGINAL HYSTERECTOMY, FOR UTERUS 250 G OR LESS; WITH REPAIR OF ENTEROCELE COLECTOMY, PARTIAL; WITH COLOPROCTOSTOMY (LOW PELVIC ANASTOMOSIS) WITH COLOSTOMY CYSTOURETHROSCOPY, WITH INSERTION OF INDWELLING URETERAL STENT (EG, GIBBONS OR DOUBLE-J TYPE) - Cystourethroscopy - Bilateral ureteral stent placement - Foley catheter placement  Performing Service: Gynecology Oncology Surgeon(s) and Role: Panel 1: * Lafonda Mosses, MD - Primary * Bernadene Bell, MD - Resident - Assisting * Devonne Doughty, MD - Resident - Assisting Panel 2: * Franchot Erichsen, MD - Primary  Drains:  - Left 6Fr open-ended ureteral access catheter (green) - Right 5Fr open-ended ureteral access catheter (white) - 16Fr foley catheter to drainage  * No implants in log *  Indications: 56 y.o. female with high grade uterine sarcoma. Urology was consulted pre-operatively for placement of bilateral ureteral stents. Risks, benefits, and alternatives of the above procedure were discussed and informed consent was signed.  OperativeFindings:  - Grossly distorted architecture of urinary bladder likely 2/2 pelvic mass with anterolaterally positioned UOs - Successful placement of bilateral  open-ended ureteral catheters under direct visualization - Foley catheter placed at case conclusion  Description: The patient was correctly identified in the preop holding area where written informed consent as well potential risk and complication reviewed. She agreed. The patient was brought to the operative suite where a preinduction timeout was performed. Once correct information was verified, general anesthesia was induced. The patient was then gently placed into dorsal lithotomy position with SCDs in place for VTE prophylaxis. They were prepped and draped in the usual sterile fashion and given appropriate preoperative antibiotics. A second timeout was then performed.   We inserted a 67F rigid cystoscope per urethra with copious lubrication and normal saline irrigation running. We performed cystourethroscopy, which revealed the above findings.  We turned our attention to the left ureteral orifice and canulated it with a sensor wire, using assistance of a 6Fr open-ended catheter. The wire was advanced into the renal pelvis without difficulty under visual guidance. We then advanced the stent over our wire into the renal pelvis under direct visualization and feel without complication. The wire was subsequently removed.   We then turned our attention to the right ureteral orifice and canulated it with a sensor wire, using assistance of a 5Fr open-ended catheter. The wire was advanced into the renal pelvis without difficulty under visual guidance. We then advanced the stent over our wire into the renal pelvis under direct visualization and feel without complication. The wire was subsequently removed.   A 16Fr straight catheter was  placed, with return of urine indicating appropriate position within the bladder. The balloon was inflated with 10cc sterile water. The stents were secured to the Foley using 0-silk ties, being careful not to occlude the stents or Foley.   The patient was awoken from general  anesthesia having tolerated the procedure well and taken to the PACU for routine post-operative recovery.  Post-Op Plan:  - Foley and stents per primary team    07/17/2021 Surgery   Date of Surgery: 07/17/2021  Pre-op Diagnosis: Uterine spindle cell malignancy  Post-op Diagnosis: Same  Procedure(s): Panel 1 RADICAL ABDOMINAL HYSTER, with bilateral S&O, vagineconty upper, bilateral pelvic lyphadenectomy, bilateral ureterolysis,: 73710 (CPT) Panel 2 CYSTOURETHROSCOPY, WITH INSERTION OF INDWELLING URETERAL STENT (EG, GIBBONS OR DOUBLE-J TYPE): 62694 (CPT) Note: Revisions to procedures should be made in chart - see Procedures activity.  Performing Service: Gynecology Oncology Surgeon(s) and Role: Panel 1: * Lafonda Mosses, MD - Primary * Bernadene Bell, MD - Resident - Assisting * Devonne Doughty, MD - Resident - Assisting Panel 2: * Franchot Erichsen, MD - Primary  Findings: On bimanual exam, 10cm necrotic mass filling upper vagina, unable to discretely palpate the cervix. On rectovaginal exam, rectal involvement not identified. Intraoperatively, normal upper abdominal survey including normal liver, diaphragm, stomach, omentum and bowel. Small uterus with 10cm mass expanding the cervix. Palpably enlarged bilateral pelvic lymph nodes adherent to the external iliac veins and obturator nerves, removed. No palpable para-aortic lymphadenopathy. No rectal involvement of uterine mass.   Specimens:  ID Type Source Tests Collected by Time Destination  1 : uterus,cervix,bilateral tubes/ovaries Tissue Uterus SURGICAL PATHOLOGY EXAM Lafonda Mosses, MD 07/17/2021 0932  2 : right pelvic lymph node Tissue Lymph Node SURGICAL PATHOLOGY EXAM Lafonda Mosses, MD 07/17/2021 1125  3 : LEFT PELVIC LN Tissue Lymph Node SURGICAL PATHOLOGY EXAM Lafonda Mosses, MD 07/17/2021 1144    08/06/2021 Initial Diagnosis   Uterine leiomyosarcoma ([place])   08/06/2021 Cancer Staging   Staging form:  Corpus Uteri - Leiomyosarcoma and Endometrial Stromal Sarcoma, AJCC 8th Edition - Pathologic stage from 08/06/2021: FIGO Stage IVB (pT3, pN1, cM1) - Signed by [name], MD on 08/11/2021 Stage prefix: Initial diagnosis    08/10/2021 Imaging   CT abdomen and pelvis 1. Interval development of left lobe pulmonary nodules, measuring up to 7 mm and highly for metastatic disease. 2. Interval development of small to upper normal lymph nodes in the pelvis, concerning for metastatic disease. 3. Postoperative seroma left pelvic sidewall. 4. Tiny cluster of tree-in-bud opacity in the peripheral right lower lobe is new and compatible with sequelae of atypical infection.   08/13/2021 Procedure   Procedure: Placement of a right IJ approach single lumen PowerPort.  Tip is positioned at the superior cavoatrial junction and catheter is ready for immediate use.  Complications: No immediate   08/14/2021 Echocardiogram    1. Left ventricular ejection fraction, by estimation, is 60 to 65%. The left ventricle has normal function. The left ventricle has no regional wall motion abnormalities. Left ventricular diastolic parameters were normal. The average left ventricular global longitudinal strain is -17.4 %. The global longitudinal strain is normal.  2. Right ventricular systolic function is normal. The right ventricular size is normal.  3. The mitral valve is normal in structure. No evidence of mitral valve regurgitation. No evidence of mitral stenosis.  4. The aortic valve is tricuspid. Aortic valve regurgitation is not visualized. No aortic stenosis is present.  5. The inferior vena cava  is normal in size with greater than 50% respiratory variability, suggesting right atrial pressure of 3 mmHg.     08/17/2021 Imaging   Multiple new and enlarging pulmonary nodules scattered throughout the lungs bilaterally, highly concerning for progressive metastatic disease to the lungs   08/18/2021 -  Chemotherapy   Patient  is on Treatment Plan : UTERINE LEIOMYOSARCOMA Doxorubicin q21d x 6 Cycles     11/09/2021 Imaging   IMPRESSION: 1. Multiple small bilateral pulmonary nodules, some of which are slightly increased in size. Other nodules unchanged. 2. Interval decrease in size of left pelvic sidewall lymph nodes. 3. Unchanged size of perirectal lymph nodes or soft tissue nodules. These however demonstrate new internal hypodensity, suggesting treatment response and internal necrosis. 4. Unchanged left iliac lymph node or peritoneal nodule. 5. Findings are consistent with mixed response to treatment. No evidence of new metastatic disease in the chest, abdomen, or pelvis. 6. Wall thickening and mucosal hyperenhancement of the bladder, consistent with nonspecific infectious or inflammatory cystitis. Correlate with urinalysis. 7. Status post hysterectomy and oophorectomy. Interval resolution of a previously noted left pelvic hematoma or seroma. 8. Trace, nonspecific free fluid in the low pelvis.   Pulmonary metastases ([place])  08/11/2021 Initial Diagnosis   Pulmonary metastases ([place])   08/18/2021 -  Chemotherapy   Patient is on Treatment Plan : UTERINE LEIOMYOSARCOMA Doxorubicin q21d x 6 Cycles     11/09/2021 Imaging   IMPRESSION: 1. Multiple small bilateral pulmonary nodules, some of which are slightly increased in size. Other nodules unchanged. 2. Interval decrease in size of left pelvic sidewall lymph nodes. 3. Unchanged size of perirectal lymph nodes or soft tissue nodules. These however demonstrate new internal hypodensity, suggesting treatment response and internal necrosis. 4. Unchanged left iliac lymph node or peritoneal nodule. 5. Findings are consistent with mixed response to treatment. No evidence of new metastatic disease in the chest, abdomen, or pelvis. 6. Wall thickening and mucosal hyperenhancement of the bladder, consistent with nonspecific infectious or inflammatory cystitis. Correlate with  urinalysis. 7. Status post hysterectomy and oophorectomy. Interval resolution of a previously noted left pelvic hematoma or seroma. 8. Trace, nonspecific free fluid in the low pelvis.     PHYSICAL EXAMINATION: ECOG PERFORMANCE STATUS: 1 - Symptomatic but completely ambulatory  Vitals:   11/10/21 1058  BP: (!) 146/97  Pulse: (!) 102  Resp: 18  Temp: 99.3 F (37.4 C)  SpO2: 100%   Filed Weights   11/10/21 1058  Weight: 129 lb 6.4 oz (58.7 kg)    GENERAL:alert, no distress and comfortable NEURO: alert & oriented x 3 with fluent speech, no focal motor/sensory deficits  LABORATORY DATA:  I have reviewed the data as listed    Component Value Date/Time   NA 140 11/06/2021 1212   K 3.5 11/06/2021 1212   CL 104 11/06/2021 1212   CO2 28 11/06/2021 1212   GLUCOSE 118 (H) 11/06/2021 1212   BUN 8 11/06/2021 1212   CREATININE 0.67 11/06/2021 1212   CALCIUM 9.3 11/06/2021 1212   PROT 7.0 11/06/2021 1212   ALBUMIN 4.2 11/06/2021 1212   AST 17 11/06/2021 1212   ALT 19 11/06/2021 1212   ALKPHOS 69 11/06/2021 1212   BILITOT 0.5 11/06/2021 1212   GFRNONAA >60 11/06/2021 1212    No results found for: SPEP, UPEP  Lab Results  Component Value Date   WBC 2.1 (L) 11/06/2021   NEUTROABS 0.7 (L) 11/06/2021   HGB 11.7 (L) 11/06/2021   HCT 33.7 (L) 11/06/2021  MCV 86.4 11/06/2021   PLT 244 11/06/2021      Chemistry      Component Value Date/Time   NA 140 11/06/2021 1212   K 3.5 11/06/2021 1212   CL 104 11/06/2021 1212   CO2 28 11/06/2021 1212   BUN 8 11/06/2021 1212   CREATININE 0.67 11/06/2021 1212      Component Value Date/Time   CALCIUM 9.3 11/06/2021 1212   ALKPHOS 69 11/06/2021 1212   AST 17 11/06/2021 1212   ALT 19 11/06/2021 1212   BILITOT 0.5 11/06/2021 1212       RADIOGRAPHIC STUDIES: I have reviewed multiple imaging studies with the patient and her husband I have personally reviewed the radiological images as listed and agreed with the findings in the  report. CT CHEST ABDOMEN PELVIS W CONTRAST  Result Date: 11/08/2021 CLINICAL DATA:  Leiomyosarcoma, status post hysterectomy, assess treatment response EXAM: CT CHEST, ABDOMEN, AND PELVIS WITH CONTRAST TECHNIQUE: Multidetector CT imaging of the chest, abdomen and pelvis was performed following the standard protocol during bolus administration of intravenous contrast. RADIATION DOSE REDUCTION: This exam was performed according to the departmental dose-optimization program which includes automated exposure control, adjustment of the mA and/or kV according to patient size and/or use of iterative reconstruction technique. CONTRAST:  155mL OMNIPAQUE IOHEXOL 300 MG/ML SOLN, additional oral enteric contrast COMPARISON:  CT chest, 08/14/2021, CT abdomen pelvis, 08/10/2021 FINDINGS: CT CHEST FINDINGS Cardiovascular: Right chest port catheter. Normal heart size. No pericardial effusion. Mediastinum/Nodes: No enlarged mediastinal, hilar, or axillary lymph nodes. Thyroid gland, trachea, and esophagus demonstrate no significant findings. Lungs/Pleura: Multiple small bilateral pulmonary nodules, some of which are slightly increased in size, for example the largest subpleural nodule of the medial right apex, measuring 1.5 x 1.3 cm, previously 1.3 x 1.0 cm (series 4, image 45) and a nodule of the medial left lower lobe measuring 0.7 x 0.7 cm, previously 0.5 x 0.5 cm (series 4, image 115). Multiple additional small nodules are not appreciably changed. No pleural effusion or pneumothorax. Musculoskeletal: No chest wall mass or suspicious osseous lesions identified. CT ABDOMEN PELVIS FINDINGS Hepatobiliary: No solid liver abnormality is seen. No gallstones, gallbladder wall thickening, or biliary dilatation. Pancreas: Unremarkable. No pancreatic ductal dilatation or surrounding inflammatory changes. Spleen: Normal in size without significant abnormality. Adrenals/Urinary Tract: Adrenal glands are unremarkable. Kidneys are normal,  without renal calculi, solid lesion, or hydronephrosis. Wall thickening and mucosal hyperenhancement of the bladder (series 6, image 75). Stomach/Bowel: Stomach is within normal limits. Appendix appears normal. No evidence of bowel wall thickening, distention, or inflammatory changes. Vascular/Lymphatic: No significant vascular findings are present. Interval decrease in size of left pelvic sidewall lymph nodes, largest measuring 1.1 x 0.9 cm, previously 1.7 x 1.3 cm when measured similarly (series 2, image 109). Unchanged size of perirectal lymph nodes or soft tissue nodules, largest measuring 1.0 x 0.8 cm at the right aspect of the superior rectum (series 2, image 113). These however demonstrate new internal hypodensity. Unchanged peritoneal nodule or iliac lymph node in the left lower quadrant adjacent to the external iliac artery and sigmoid colon, measuring 0.8 x 0.7 cm (series 2, image 107). Reproductive: Status post hysterectomy and oophorectomy. Interval resolution of a previously noted left pelvic hematoma or seroma. Other: No abdominal wall hernia or abnormality. Trace, nonspecific free fluid in the low pelvis (series 2, image 111). Musculoskeletal: No acute osseous findings. IMPRESSION: 1. Multiple small bilateral pulmonary nodules, some of which are slightly increased in size. Other nodules unchanged. 2.  Interval decrease in size of left pelvic sidewall lymph nodes. 3. Unchanged size of perirectal lymph nodes or soft tissue nodules. These however demonstrate new internal hypodensity, suggesting treatment response and internal necrosis. 4. Unchanged left iliac lymph node or peritoneal nodule. 5. Findings are consistent with mixed response to treatment. No evidence of new metastatic disease in the chest, abdomen, or pelvis. 6. Wall thickening and mucosal hyperenhancement of the bladder, consistent with nonspecific infectious or inflammatory cystitis. Correlate with urinalysis. 7. Status post hysterectomy  and oophorectomy. Interval resolution of a previously noted left pelvic hematoma or seroma. 8. Trace, nonspecific free fluid in the low pelvis. Electronically Signed   By: Delanna Ahmadi M.D.   On: 11/08/2021 10:55

## 2021-11-11 NOTE — Telephone Encounter (Signed)
Pt called to schedule echo per MD. Pt has been scheduled for 11/30/21 at 0900. Pt is aware this is at [facility] and she will need to arrive at [facility] for check in to main entrance.

## 2021-11-16 ENCOUNTER — Other Ambulatory Visit: Payer: Self-pay | Admitting: Hematology and Oncology

## 2021-11-16 ENCOUNTER — Telehealth: Payer: Self-pay

## 2021-11-16 MED ORDER — CIPROFLOXACIN HCL 250 MG PO TABS: 250.0000 mg | ORAL_TABLET | Freq: Two times a day (BID) | ORAL | 0 refills | Status: DC

## 2021-11-16 NOTE — Telephone Encounter (Signed)
Returned her call. She is not sure that she is over the UTI. Denies fever. Urine is cloudy but she is not sure that she is drinking enough. C/o a strong odor to urine. She is having lower back discomfort, but she is not sure if that is from not walking around much. She is having a twinge with urination. Denies burning. She is asking if you think she needs more antibiotics?  Instructed to drink more fluids and move around more this afternoon. She verbalized understanding.

## 2021-11-16 NOTE — Telephone Encounter (Signed)
Called and given below message. She verbalized understanding and will start the antibiotic today.

## 2021-11-16 NOTE — Telephone Encounter (Signed)
Her urine culture actually showed a second growth that is resistant to macrobid I am sending another antibiotic to her pharmacy

## 2021-11-19 ENCOUNTER — Telehealth: Payer: Self-pay

## 2021-11-19 NOTE — Telephone Encounter (Signed)
-----   Message from [name], MD sent at 11/19/2021  9:17 AM EST ----- Can you call and ask if her UTI is better?

## 2021-11-19 NOTE — Telephone Encounter (Signed)
Called and given below message. She is feeling better and taking the antibiotic as instructed. She will call the office back if needed.

## 2021-11-30 ENCOUNTER — Encounter: Payer: Self-pay | Admitting: Hematology and Oncology

## 2021-11-30 ENCOUNTER — Other Ambulatory Visit: Payer: Self-pay | Admitting: Hematology and Oncology

## 2021-11-30 ENCOUNTER — Other Ambulatory Visit: Payer: Self-pay

## 2021-11-30 ENCOUNTER — Telehealth: Payer: Self-pay

## 2021-11-30 ENCOUNTER — Inpatient Hospital Stay: Payer: BC Managed Care – PPO | Attending: Gynecologic Oncology | Admitting: Hematology and Oncology

## 2021-11-30 ENCOUNTER — Ambulatory Visit (HOSPITAL_COMMUNITY)
Admission: RE | Admit: 2021-11-30 | Discharge: 2021-11-30 | Disposition: A | Discharge status: Home / Self Care | Payer: BC Managed Care – PPO | Source: Ambulatory Visit | Attending: Hematology and Oncology | Admitting: Hematology and Oncology

## 2021-11-30 ENCOUNTER — Inpatient Hospital Stay: Payer: BC Managed Care – PPO

## 2021-11-30 VITALS — BP 134/83 | HR 90 | Temp 98.8°F | Resp 18 | Ht 68.0 in | Wt 127.4 lb

## 2021-11-30 DIAGNOSIS — Z5111 Encounter for antineoplastic chemotherapy: Secondary | ICD-10-CM | POA: Insufficient documentation

## 2021-11-30 DIAGNOSIS — C7802 Secondary malignant neoplasm of left lung: Secondary | ICD-10-CM | POA: Diagnosis not present

## 2021-11-30 DIAGNOSIS — Z01818 Encounter for other preprocedural examination: Secondary | ICD-10-CM | POA: Diagnosis not present

## 2021-11-30 DIAGNOSIS — T50905A Adverse effect of unspecified drugs, medicaments and biological substances, initial encounter: Secondary | ICD-10-CM | POA: Diagnosis not present

## 2021-11-30 DIAGNOSIS — N390 Urinary tract infection, site not specified: Secondary | ICD-10-CM

## 2021-11-30 DIAGNOSIS — I427 Cardiomyopathy due to drug and external agent: Secondary | ICD-10-CM

## 2021-11-30 DIAGNOSIS — D61818 Other pancytopenia: Secondary | ICD-10-CM

## 2021-11-30 DIAGNOSIS — C7801 Secondary malignant neoplasm of right lung: Secondary | ICD-10-CM

## 2021-11-30 DIAGNOSIS — K1232 Oral mucositis (ulcerative) due to other drugs: Secondary | ICD-10-CM | POA: Insufficient documentation

## 2021-11-30 DIAGNOSIS — R634 Abnormal weight loss: Secondary | ICD-10-CM | POA: Insufficient documentation

## 2021-11-30 DIAGNOSIS — C55 Malignant neoplasm of uterus, part unspecified: Secondary | ICD-10-CM

## 2021-11-30 DIAGNOSIS — Z0189 Encounter for other specified special examinations: Secondary | ICD-10-CM

## 2021-11-30 DIAGNOSIS — D61810 Antineoplastic chemotherapy induced pancytopenia: Secondary | ICD-10-CM | POA: Diagnosis not present

## 2021-11-30 DIAGNOSIS — I428 Other cardiomyopathies: Secondary | ICD-10-CM | POA: Diagnosis not present

## 2021-11-30 DIAGNOSIS — T451X5A Adverse effect of antineoplastic and immunosuppressive drugs, initial encounter: Secondary | ICD-10-CM

## 2021-11-30 DIAGNOSIS — N3090 Cystitis, unspecified without hematuria: Secondary | ICD-10-CM

## 2021-11-30 DIAGNOSIS — K1231 Oral mucositis (ulcerative) due to antineoplastic therapy: Secondary | ICD-10-CM

## 2021-11-30 LAB — CBC WITH DIFFERENTIAL (CANCER CENTER ONLY)
Abs Immature Granulocytes: 0.01 10*3/uL (ref 0.00–0.07)
Basophils Absolute: 0.1 10*3/uL (ref 0.0–0.1)
Basophils Relative: 2 %
Eosinophils Absolute: 0 10*3/uL (ref 0.0–0.5)
Eosinophils Relative: 1 %
HCT: 33.6 % — ABNORMAL LOW (ref 36.0–46.0)
Hemoglobin: 11.2 g/dL — ABNORMAL LOW (ref 12.0–15.0)
Immature Granulocytes: 0 %
Lymphocytes Relative: 28 %
Lymphs Abs: 0.8 10*3/uL (ref 0.7–4.0)
MCH: 30.4 pg (ref 26.0–34.0)
MCHC: 33.3 g/dL (ref 30.0–36.0)
MCV: 91.1 fL (ref 80.0–100.0)
Monocytes Absolute: 0.6 10*3/uL (ref 0.1–1.0)
Monocytes Relative: 19 %
Neutro Abs: 1.4 10*3/uL — ABNORMAL LOW (ref 1.7–7.7)
Neutrophils Relative %: 50 %
Platelet Count: 296 10*3/uL (ref 150–400)
RBC: 3.69 MIL/uL — ABNORMAL LOW (ref 3.87–5.11)
RDW: 15.9 % — ABNORMAL HIGH (ref 11.5–15.5)
WBC Count: 2.9 10*3/uL — ABNORMAL LOW (ref 4.0–10.5)
nRBC: 0 % (ref 0.0–0.2)

## 2021-11-30 LAB — CMP (CANCER CENTER ONLY)
ALT: 17 U/L (ref 0–44)
AST: 21 U/L (ref 15–41)
Albumin: 4.1 g/dL (ref 3.5–5.0)
Alkaline Phosphatase: 76 U/L (ref 38–126)
Anion gap: 5 (ref 5–15)
BUN: 8 mg/dL (ref 6–20)
CO2: 31 mmol/L (ref 22–32)
Calcium: 9.3 mg/dL (ref 8.9–10.3)
Chloride: 106 mmol/L (ref 98–111)
Creatinine: 0.7 mg/dL (ref 0.44–1.00)
GFR, Estimated: >60 mL/min (ref >60)
Glucose, Bld: 102 mg/dL — ABNORMAL HIGH (ref 70–99)
Potassium: 4 mmol/L (ref 3.5–5.1)
Sodium: 142 mmol/L (ref 135–145)
Total Bilirubin: 0.3 mg/dL (ref 0.3–1.2)
Total Protein: 6.8 g/dL (ref 6.5–8.1)

## 2021-11-30 LAB — URINALYSIS, COMPLETE (UACMP) WITH MICROSCOPIC
Bilirubin Urine: NEGATIVE
Glucose, UA: NEGATIVE mg/dL
Hgb urine dipstick: NEGATIVE
Ketones, ur: NEGATIVE mg/dL
Nitrite: NEGATIVE
Protein, ur: NEGATIVE mg/dL
Specific Gravity, Urine: 1.011 (ref 1.005–1.030)
pH: 7 (ref 5.0–8.0)

## 2021-11-30 LAB — ECHOCARDIOGRAM COMPLETE
AV Peak grad: 5.9 mmHg
Ao pk vel: 1.21 m/s
Area-P 1/2: 3.28 cm2
S' Lateral: 3.35 cm
Single Plane A4C EF: 31 %

## 2021-11-30 NOTE — Telephone Encounter (Signed)
Called back. She is finishing up echo now. She will come to the office now for MD appt and lab appt. Appt added.

## 2021-11-30 NOTE — Progress Notes (Signed)
Echocardiogram 2D Echocardiogram has been performed.  Jefferey Pica 11/30/2021, 10:05 AM

## 2021-11-30 NOTE — Assessment & Plan Note (Signed)
Unfortunately, she has developed Adriamycin induced cardiomyopathy I will refer her over to Dr. Haroldine Laws in the heart failure clinic for further evaluation and management Clinically, she is not symptomatic

## 2021-11-30 NOTE — Addendum Note (Signed)
Encounter addended by: Jefferey Pica on: 11/30/2021 10:05 AM  Actions taken: Imaging Exam ended, Clinical Note Signed

## 2021-11-30 NOTE — Assessment & Plan Note (Signed)
She denies mucositis in her mouth However, she has some sensation of bladder irritation She was found to have UTI recently I will repeat urinalysis and urine culture and we will call her with test results

## 2021-11-30 NOTE — Telephone Encounter (Signed)
She called and left a message. She finished up antibiotics last week. Over the weekend she started having bladder pain and feeling achy. Having pain with urination at times.  She is asking if she needs more antibiotics? Next appt 2/24.

## 2021-11-30 NOTE — Addendum Note (Signed)
Encounter addended by: Jefferey Pica on: 11/30/2021 10:04 AM  Actions taken: Imaging Exam begun

## 2021-11-30 NOTE — Assessment & Plan Note (Signed)
This is due to recent treatment We will repeat blood work again next week for further follow-up

## 2021-11-30 NOTE — Progress Notes (Signed)
DISCONTINUE ON PATHWAY REGIMEN - Uterine     A cycle is every 21 days:     Doxorubicin   **Always confirm dose/schedule in your pharmacy ordering system**  REASON: Toxicities / Adverse Event PRIOR TREATMENT: [id number]: Doxorubicin 75 mg/m2 q21 Days Until Progression, Unacceptable Toxicity, Complete Response, or Lifetime Cumulative Anthracycline Dose Reached TREATMENT RESPONSE: Partial Response (PR)  START ON PATHWAY REGIMEN - Uterine     A cycle is every 21 days:     Gemcitabine      Docetaxel      [id number]   **Always confirm dose/schedule in your pharmacy ordering system**  Patient Characteristics: High Grade Undifferentiated/Leiomyosarcoma, Recurrent/Progressive Disease, Medically Inoperable, Second Line, Relapse < 12 Months From Prior Therapy Histology: High Grade Undifferentiated/Leiomyosarcoma Therapeutic Status: Recurrent or Progressive Disease Surgical Status: Medically Inoperable Line of Therapy: Second Line Time to Recurrence: Relapse < 12 Months From Prior Therapy Intent of Therapy: Non-Curative / Palliative Intent, Discussed with Patient

## 2021-11-30 NOTE — Assessment & Plan Note (Signed)
She had multiple side effects from Adriamycin including mucositis, pancytopenia and recent infection Unfortunately, repeat echocardiogram today show new signs of cardiomyopathy I will discontinue her chemotherapy that was scheduled for Friday and switch her to gemcitabine/Taxotere combination I briefly explained to the patient over the phone side effects to be expected with this combination I will see her next week before treatment for further discussions about plan of care She is in agreement

## 2021-11-30 NOTE — Telephone Encounter (Signed)
Called and left a message asking her to call the office back. 

## 2021-11-30 NOTE — Telephone Encounter (Signed)
Can she come in today for labs, UA and UCx? I can see her at 69

## 2021-11-30 NOTE — Progress Notes (Signed)
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[facility]) She had multiple side effects from Adriamycin including mucositis, pancytopenia and recent infection Unfortunately, repeat echocardiogram today show new signs of cardiomyopathy I will discontinue her chemotherapy that was scheduled for Friday and switch her to gemcitabine/Taxotere combination I briefly explained to the patient over the phone side effects to be expected with this combination I will see her next week before treatment for further discussions about plan of care She is in agreement  Pancytopenia, acquired [facility]) This is due to recent treatment We will repeat blood work again next week for further follow-up  Mucositis due to antineoplastic therapy She denies mucositis in her mouth However, she has some sensation of bladder irritation She was found to have UTI recently I will repeat urinalysis and urine culture and we will call her with test results  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 Haroldine Laws in the heart failure clinic for further evaluation and management Clinically, she is not symptomatic  Orders Placed This Encounter  Procedures   CBC with Differential ([place] Only)    Standing Status:   Standing    Number of Occurrences:   20    Standing Expiration Date:   11/30/2022   CMP ([place] only)    Standing Status:   Standing    Number of Occurrences:   20    Standing Expiration Date:   11/30/2022   Ambulatory referral to Cardiology    Referral Priority:   Routine    Referral Type:   Consultation    Referral Reason:   Specialty Services Required    Referred to Provider:   Jolaine Artist, MD    Requested Specialty:    Cardiology    Number of Visits Requested:   1    All questions were answered. The patient knows to call the clinic with any problems, questions or concerns. The total time spent in the appointment was 40 minutes encounter with patients including review of chart and various tests results, discussions about plan of care and coordination of care plan   [name], MD 11/30/2021 1:52 PM  INTERVAL HISTORY: Please see below for problem oriented charting. she returns for treatment follow-up She is seen urgently due to sensation of bladder irritation and discomfort despite recent antibiotics The patient is seen this morning and then I call her this afternoon when results of echocardiogram became available She has no recent chest pain, shortness of breath or leg swelling  REVIEW OF SYSTEMS:   Constitutional: Denies fevers, chills or abnormal weight loss Eyes: Denies blurriness of vision Ears, nose, mouth, throat, and face: Denies mucositis or sore throat Respiratory: Denies cough, dyspnea or wheezes Cardiovascular: Denies palpitation, chest discomfort or lower extremity swelling Gastrointestinal:  Denies nausea, heartburn or change in bowel habits Skin: Denies abnormal skin rashes Lymphatics: Denies new lymphadenopathy or easy bruising Neurological:Denies numbness, tingling or new weaknesses Behavioral/Psych: Mood is stable, no new changes  All other systems were reviewed with the patient and are negative.  I have reviewed the past medical history, past surgical history, social history and family history with the patient and they are unchanged from previous note.  ALLERGIES:  is allergic to doxycycline.  MEDICATIONS:  Current Outpatient Medications  Medication Sig  Dispense Refill   ciprofloxacin (CIPRO) 250 MG tablet Take 1 tablet (250 mg total) by mouth 2 (two) times daily. 14 tablet 0   acetaminophen (TYLENOL) 500 MG tablet Take 1,000 mg by mouth every 6 (six) hours as needed for  moderate pain or headache.     bisacodyl 5 MG EC tablet Take 1 tablet (5 mg total) by mouth daily as needed for moderate constipation. 30 tablet 3   lidocaine-prilocaine (EMLA) cream Apply to affected area once 30 g 3   LORazepam (ATIVAN) 0.5 MG tablet Take 1 tablet (0.5 mg total) by mouth 2 (two) times daily as needed for anxiety. 30 tablet 0   ondansetron (ZOFRAN) 8 MG tablet Take 1 tablet (8 mg total) by mouth every 8 (eight) hours as needed. 30 tablet 1   prochlorperazine (COMPAZINE) 10 MG tablet Take 1 tablet (10 mg total) by mouth every 6 (six) hours as needed (Nausea or vomiting). 90 tablet 1   No current facility-administered medications for this visit.    SUMMARY OF ONCOLOGIC HISTORY: Oncology History  Uterine leiomyosarcoma ([place])  06/11/2021 Imaging   1. 9.5 x 7.6 x 9.0 cm complex, partially necrotic, mass involving the lower uterine segment/ cervix. No obvious direct extension into the parametrium.  2. 9 mm left pelvic sidewall lymph node is partially necrotic and worrisome for metastatic adenopathy.  3. No findings for abdominal omental or peritoneal surface disease or adenopathy.  4. Tiny low-attenuation lesion in the pancreatic head, likely benign cyst but attention on follow-up scans is suggested.  5. 2.9 cm fundal fibroid.    06/19/2021 Pathology Results   FINAL MICROSCOPIC DIAGNOSIS:   A. UTERINE, CERVICAL MASS, BIOPSY:  - Spindle cell malignancy.  - See comment.   COMMENT:  The biopsies consist of endocervical mucosa with stromal edema and one biopsy fragment has a microscopic focus with atypical spindle cells consistent with poorly differentiated malignancy.  The differential  includes a spindle cell malignancy such as sarcomatoid carcinoma and leiomyosarcoma.  Mullerian adenosarcoma is also a consideration but considered less likely   06/23/2021 Imaging   MR pelvis  10 cm uterine mass with central necrosis, which is centered in the cervix and lower uterine segment.  Right parametrial involvement is seen as well as suspected invasion of the distal rectum. Differential diagnosis includes cervical carcinoma and uterine leiomyosarcoma.   Mild bilateral iliac lymphadenopathy, highly suspicious for metastatic disease.   2.9 cm subserosal fibroid in the posterior fundus.   Normal appearance of both ovaries.     06/29/2021 PET scan   1. Hypermetabolic necrotic cervical/uterine mass with bilateral external iliac hypermetabolic lymph nodes. No evidence of distant metastatic disease. 2. 1.5 cm low-attenuation left thyroid nodule. Recommend thyroid ultrasound. (Ref: J Am Coll Radiol. 2015 Feb;12(2): 143-50).   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, pleomorphic, and myxoid areas and associated necrosis (~20%) - Tumor based in cervix and also involves lower uterine segment - Cervicovaginal margin involved by focal invasive leiomyosarcoma (3:00-5:00, A10) as well as tumor in lymphovascular spaces - Leiomyosarcoma involves right and left parametrial tissue and extends to parametrial margins - Extensive lymphovascular space invasion present, including in uterus and parametria - See synoptic report and comment   Other findings: - Leiomyomata with hyalinization, size up to 3.0 cm - Ovaries and fallopian tubes with no parenchymal involvement by leiomyosarcoma identified, although  adnexal lymphovascular space invasion is present   B: Lymph nodes, right pelvic, lymphadenectomy - One of four lymph nodes positive for metastatic leiomyosarcoma (1/4), with extracapsular extension present   C: Lymph nodes, left pelvic, lymphadenectomy - One of four lymph nodes positive for metastatic leiomyosarcoma (1/4), with extracapsular extension present  Immunohistochemical stains are performed on block  A11, and demonstrate that the tumor is positive for desmin and CD10, with SMA staining the majority of the spindle cell component but largely negative in the epithelioid / pleomorphic component. OSCAR, pancytokeratin AE1/AE3, HMB45, and PR appear negative in the tumor. ER shows patchy weak staining and myogenin stains rare cells. Block A23 also shows positive desmin and negative OSCAR pancytokeratin. Overall, the findings are most consistent with leiomyosarcoma, with extensive areas that are myxoid, epithelioid, and pleomorphic as well as more typical spindle cell areas within the overall high grade tumor (grade 3 / 3). The tumor is staged as pT2b (involves other pelvic tissues) given the parametrial involvement and pN1 for FIGO stage IIIC.    07/17/2021 Surgery   Date of Surgery: 07/17/21  Preoperative Diagnosis: High Grade Uterine Sarcoma  Postoperative Diagnosis: Same  Procedure(s): Bilateral - RADICAL ABDOMINAL HYSTER, W/BIL TOTAL PELVIC LYMPHADENECTOMY & PARA-AORTIC LYMPH NODE BX W/WO REM TUBE/OVAR VAGINAL HYSTERECTOMY, FOR UTERUS 250 G OR LESS; WITH REPAIR OF ENTEROCELE COLECTOMY, PARTIAL; WITH COLOPROCTOSTOMY (LOW PELVIC ANASTOMOSIS) WITH COLOSTOMY CYSTOURETHROSCOPY, WITH INSERTION OF INDWELLING URETERAL STENT (EG, GIBBONS OR DOUBLE-J TYPE) - Cystourethroscopy - Bilateral ureteral stent placement - Foley catheter placement  Performing Service: Gynecology Oncology Surgeon(s) and Role: Panel 1: * Lafonda Mosses, MD - Primary * Bernadene Bell, MD - Resident - Assisting * Devonne Doughty, MD - Resident - Assisting Panel 2: * Franchot Erichsen, MD - Primary  Drains:  - Left 6Fr open-ended ureteral access catheter (green) - Right 5Fr open-ended ureteral access catheter (white) - 16Fr foley catheter to drainage  * No implants in log *  Indications: 56 y.o. female with high grade uterine sarcoma. Urology was consulted pre-operatively for placement of bilateral ureteral  stents. Risks, benefits, and alternatives of the above procedure were discussed and informed consent was signed.  OperativeFindings:  - Grossly distorted architecture of urinary bladder likely 2/2 pelvic mass with anterolaterally positioned UOs - Successful placement of bilateral open-ended ureteral catheters under direct visualization - Foley catheter placed at case conclusion  Description: The patient was correctly identified in the preop holding area where written informed consent as well potential risk and complication reviewed. She agreed. The patient was brought to the operative suite where a preinduction timeout was performed. Once correct information was verified, general anesthesia was induced. The patient was then gently placed into dorsal lithotomy position with SCDs in place for VTE prophylaxis. They were prepped and draped in the usual sterile fashion and given appropriate preoperative antibiotics. A second timeout was then performed.   We inserted a 62F rigid cystoscope per urethra with copious lubrication and normal saline irrigation running. We performed cystourethroscopy, which revealed the above findings.  We turned our attention to the left ureteral orifice and canulated it with a sensor wire, using assistance of a 6Fr open-ended catheter. The wire was advanced into the renal pelvis without difficulty under visual guidance. We then advanced the stent over our wire into the renal pelvis under direct visualization and feel without complication. The wire was subsequently removed.   We then turned our attention to the right ureteral orifice and canulated it with a  sensor wire, using assistance of a 5Fr open-ended catheter. The wire was advanced into the renal pelvis without difficulty under visual guidance. We then advanced the stent over our wire into the renal pelvis under direct visualization and feel without complication. The wire was subsequently removed.   A 16Fr straight catheter  was placed, with return of urine indicating appropriate position within the bladder. The balloon was inflated with 10cc sterile water. The stents were secured to the Foley using 0-silk ties, being careful not to occlude the stents or Foley.   The patient was awoken from general anesthesia having tolerated the procedure well and taken to the PACU for routine post-operative recovery.  Post-Op Plan:  - Foley and stents per primary team    07/17/2021 Surgery   Date of Surgery: 07/17/2021  Pre-op Diagnosis: Uterine spindle cell malignancy  Post-op Diagnosis: Same  Procedure(s): Panel 1 RADICAL ABDOMINAL HYSTER, with bilateral S&O, vagineconty upper, bilateral pelvic lyphadenectomy, bilateral ureterolysis,: 32440 (CPT) Panel 2 CYSTOURETHROSCOPY, WITH INSERTION OF INDWELLING URETERAL STENT (EG, GIBBONS OR DOUBLE-J TYPE): 10272 (CPT) Note: Revisions to procedures should be made in chart - see Procedures activity.  Performing Service: Gynecology Oncology Surgeon(s) and Role: Panel 1: * Lafonda Mosses, MD - Primary * Bernadene Bell, MD - Resident - Assisting * Devonne Doughty, MD - Resident - Assisting Panel 2: * Franchot Erichsen, MD - Primary  Findings: On bimanual exam, 10cm necrotic mass filling upper vagina, unable to discretely palpate the cervix. On rectovaginal exam, rectal involvement not identified. Intraoperatively, normal upper abdominal survey including normal liver, diaphragm, stomach, omentum and bowel. Small uterus with 10cm mass expanding the cervix. Palpably enlarged bilateral pelvic lymph nodes adherent to the external iliac veins and obturator nerves, removed. No palpable para-aortic lymphadenopathy. No rectal involvement of uterine mass.   Specimens:  ID Type Source Tests Collected by Time Destination  1 : uterus,cervix,bilateral tubes/ovaries Tissue Uterus SURGICAL PATHOLOGY EXAM Lafonda Mosses, MD 07/17/2021 0932  2 : right pelvic lymph node Tissue Lymph  Node SURGICAL PATHOLOGY EXAM Lafonda Mosses, MD 07/17/2021 1125  3 : LEFT PELVIC LN Tissue Lymph Node SURGICAL PATHOLOGY EXAM Lafonda Mosses, MD 07/17/2021 1144    08/06/2021 Initial Diagnosis   Uterine leiomyosarcoma ([place])   08/06/2021 Cancer Staging   Staging form: Corpus Uteri - Leiomyosarcoma and Endometrial Stromal Sarcoma, AJCC 8th Edition - Pathologic stage from 08/06/2021: FIGO Stage IVB (pT3, pN1, cM1) - Signed by [name], MD on 08/11/2021 Stage prefix: Initial diagnosis    08/10/2021 Imaging   CT abdomen and pelvis 1. Interval development of left lobe pulmonary nodules, measuring up to 7 mm and highly for metastatic disease. 2. Interval development of small to upper normal lymph nodes in the pelvis, concerning for metastatic disease. 3. Postoperative seroma left pelvic sidewall. 4. Tiny cluster of tree-in-bud opacity in the peripheral right lower lobe is new and compatible with sequelae of atypical infection.   08/13/2021 Procedure   Procedure: Placement of a right IJ approach single lumen PowerPort.  Tip is positioned at the superior cavoatrial junction and catheter is ready for immediate use.  Complications: No immediate   08/14/2021 Echocardiogram    1. Left ventricular ejection fraction, by estimation, is 60 to 65%. The left ventricle has normal function. The left ventricle has no regional wall motion abnormalities. Left ventricular diastolic parameters were normal. The average left ventricular global longitudinal strain is -17.4 %. The global longitudinal strain is normal.  2. Right ventricular systolic  function is normal. The right ventricular size is normal.  3. The mitral valve is normal in structure. No evidence of mitral valve regurgitation. No evidence of mitral stenosis.  4. The aortic valve is tricuspid. Aortic valve regurgitation is not visualized. No aortic stenosis is present.  5. The inferior vena cava is normal in size with greater than 50% respiratory  variability, suggesting right atrial pressure of 3 mmHg.     08/17/2021 Imaging   Multiple new and enlarging pulmonary nodules scattered throughout the lungs bilaterally, highly concerning for progressive metastatic disease to the lungs   08/18/2021 - 11/10/2021 Chemotherapy   Patient is on Treatment Plan : UTERINE LEIOMYOSARCOMA Doxorubicin q21d x 6 Cycles     11/09/2021 Imaging   IMPRESSION: 1. Multiple small bilateral pulmonary nodules, some of which are slightly increased in size. Other nodules unchanged. 2. Interval decrease in size of left pelvic sidewall lymph nodes. 3. Unchanged size of perirectal lymph nodes or soft tissue nodules. These however demonstrate new internal hypodensity, suggesting treatment response and internal necrosis. 4. Unchanged left iliac lymph node or peritoneal nodule. 5. Findings are consistent with mixed response to treatment. No evidence of new metastatic disease in the chest, abdomen, or pelvis. 6. Wall thickening and mucosal hyperenhancement of the bladder, consistent with nonspecific infectious or inflammatory cystitis. Correlate with urinalysis. 7. Status post hysterectomy and oophorectomy. Interval resolution of a previously noted left pelvic hematoma or seroma. 8. Trace, nonspecific free fluid in the low pelvis.   11/30/2021 Echocardiogram    1. Left ventricular ejection fraction, by estimation, is 40 to 45%. Left ventricular ejection fraction by 3D volume is 41 %. The left ventricle has mildly decreased function. The left ventricle has no regional wall motion abnormalities. Left ventricular  diastolic parameters are consistent with Grade I diastolic dysfunction (impaired relaxation).  2. Right ventricular systolic function is moderately reduced. The right ventricular size is normal.  3. The mitral valve is grossly normal. No evidence of mitral valve regurgitation.  4. The aortic valve is normal in structure. Aortic valve regurgitation is not visualized. No  aortic stenosis is present.     12/07/2021 -  Chemotherapy   Patient is on Treatment Plan : UTERINE UNDIFFERENTIATED / LEIOMYOSARCOMA Gemcitabine D1,8 + Docetaxel D8 (900/100) q21d     Pulmonary metastases ([place])  08/11/2021 Initial Diagnosis   Pulmonary metastases ([place])   08/18/2021 - 11/10/2021 Chemotherapy   Patient is on Treatment Plan : UTERINE LEIOMYOSARCOMA Doxorubicin q21d x 6 Cycles     11/09/2021 Imaging   IMPRESSION: 1. Multiple small bilateral pulmonary nodules, some of which are slightly increased in size. Other nodules unchanged. 2. Interval decrease in size of left pelvic sidewall lymph nodes. 3. Unchanged size of perirectal lymph nodes or soft tissue nodules. These however demonstrate new internal hypodensity, suggesting treatment response and internal necrosis. 4. Unchanged left iliac lymph node or peritoneal nodule. 5. Findings are consistent with mixed response to treatment. No evidence of new metastatic disease in the chest, abdomen, or pelvis. 6. Wall thickening and mucosal hyperenhancement of the bladder, consistent with nonspecific infectious or inflammatory cystitis. Correlate with urinalysis. 7. Status post hysterectomy and oophorectomy. Interval resolution of a previously noted left pelvic hematoma or seroma. 8. Trace, nonspecific free fluid in the low pelvis.   12/07/2021 -  Chemotherapy   Patient is on Treatment Plan : UTERINE UNDIFFERENTIATED / LEIOMYOSARCOMA Gemcitabine D1,8 + Docetaxel D8 (900/100) q21d       PHYSICAL EXAMINATION: ECOG PERFORMANCE STATUS: 1 -  Symptomatic but completely ambulatory  Vitals:   11/30/21 1022  BP: 134/83  Pulse: 90  Resp: 18  Temp: 98.8 F (37.1 C)  SpO2: 100%   Filed Weights   11/30/21 1022  Weight: 127 lb 6.4 oz (57.8 kg)    GENERAL:alert, no distress and comfortable NEURO: alert & oriented x 3 with fluent speech, no focal motor/sensory deficits  LABORATORY DATA:  I have reviewed the data as listed    Component  Value Date/Time   NA 142 11/30/2021 1035   K 4.0 11/30/2021 1035   CL 106 11/30/2021 1035   CO2 31 11/30/2021 1035   GLUCOSE 102 (H) 11/30/2021 1035   BUN 8 11/30/2021 1035   CREATININE 0.70 11/30/2021 1035   CALCIUM 9.3 11/30/2021 1035   PROT 6.8 11/30/2021 1035   ALBUMIN 4.1 11/30/2021 1035   AST 21 11/30/2021 1035   ALT 17 11/30/2021 1035   ALKPHOS 76 11/30/2021 1035   BILITOT 0.3 11/30/2021 1035   GFRNONAA >60 11/30/2021 1035    No results found for: SPEP, UPEP  Lab Results  Component Value Date   WBC 2.9 (L) 11/30/2021   NEUTROABS 1.4 (L) 11/30/2021   HGB 11.2 (L) 11/30/2021   HCT 33.6 (L) 11/30/2021   MCV 91.1 11/30/2021   PLT 296 11/30/2021      Chemistry      Component Value Date/Time   NA 142 11/30/2021 1035   K 4.0 11/30/2021 1035   CL 106 11/30/2021 1035   CO2 31 11/30/2021 1035   BUN 8 11/30/2021 1035   CREATININE 0.70 11/30/2021 1035      Component Value Date/Time   CALCIUM 9.3 11/30/2021 1035   ALKPHOS 76 11/30/2021 1035   AST 21 11/30/2021 1035   ALT 17 11/30/2021 1035   BILITOT 0.3 11/30/2021 1035       RADIOGRAPHIC STUDIES: I have personally reviewed the radiological images as listed and agreed with the findings in the report. CT CHEST ABDOMEN PELVIS W CONTRAST  Result Date: 11/08/2021 CLINICAL DATA:  Leiomyosarcoma, status post hysterectomy, assess treatment response EXAM: CT CHEST, ABDOMEN, AND PELVIS WITH CONTRAST TECHNIQUE: Multidetector CT imaging of the chest, abdomen and pelvis was performed following the standard protocol during bolus administration of intravenous contrast. RADIATION DOSE REDUCTION: This exam was performed according to the departmental dose-optimization program which includes automated exposure control, adjustment of the mA and/or kV according to patient size and/or use of iterative reconstruction technique. CONTRAST:  169mL OMNIPAQUE IOHEXOL 300 MG/ML SOLN, additional oral enteric contrast COMPARISON:  CT chest,  08/14/2021, CT abdomen pelvis, 08/10/2021 FINDINGS: CT CHEST FINDINGS Cardiovascular: Right chest port catheter. Normal heart size. No pericardial effusion. Mediastinum/Nodes: No enlarged mediastinal, hilar, or axillary lymph nodes. Thyroid gland, trachea, and esophagus demonstrate no significant findings. Lungs/Pleura: Multiple small bilateral pulmonary nodules, some of which are slightly increased in size, for example the largest subpleural nodule of the medial right apex, measuring 1.5 x 1.3 cm, previously 1.3 x 1.0 cm (series 4, image 45) and a nodule of the medial left lower lobe measuring 0.7 x 0.7 cm, previously 0.5 x 0.5 cm (series 4, image 115). Multiple additional small nodules are not appreciably changed. No pleural effusion or pneumothorax. Musculoskeletal: No chest wall mass or suspicious osseous lesions identified. CT ABDOMEN PELVIS FINDINGS Hepatobiliary: No solid liver abnormality is seen. No gallstones, gallbladder wall thickening, or biliary dilatation. Pancreas: Unremarkable. No pancreatic ductal dilatation or surrounding inflammatory changes. Spleen: Normal in size without significant abnormality. Adrenals/Urinary Tract: Adrenal  glands are unremarkable. Kidneys are normal, without renal calculi, solid lesion, or hydronephrosis. Wall thickening and mucosal hyperenhancement of the bladder (series 6, image 75). Stomach/Bowel: Stomach is within normal limits. Appendix appears normal. No evidence of bowel wall thickening, distention, or inflammatory changes. Vascular/Lymphatic: No significant vascular findings are present. Interval decrease in size of left pelvic sidewall lymph nodes, largest measuring 1.1 x 0.9 cm, previously 1.7 x 1.3 cm when measured similarly (series 2, image 109). Unchanged size of perirectal lymph nodes or soft tissue nodules, largest measuring 1.0 x 0.8 cm at the right aspect of the superior rectum (series 2, image 113). These however demonstrate new internal hypodensity.  Unchanged peritoneal nodule or iliac lymph node in the left lower quadrant adjacent to the external iliac artery and sigmoid colon, measuring 0.8 x 0.7 cm (series 2, image 107). Reproductive: Status post hysterectomy and oophorectomy. Interval resolution of a previously noted left pelvic hematoma or seroma. Other: No abdominal wall hernia or abnormality. Trace, nonspecific free fluid in the low pelvis (series 2, image 111). Musculoskeletal: No acute osseous findings. IMPRESSION: 1. Multiple small bilateral pulmonary nodules, some of which are slightly increased in size. Other nodules unchanged. 2. Interval decrease in size of left pelvic sidewall lymph nodes. 3. Unchanged size of perirectal lymph nodes or soft tissue nodules. These however demonstrate new internal hypodensity, suggesting treatment response and internal necrosis. 4. Unchanged left iliac lymph node or peritoneal nodule. 5. Findings are consistent with mixed response to treatment. No evidence of new metastatic disease in the chest, abdomen, or pelvis. 6. Wall thickening and mucosal hyperenhancement of the bladder, consistent with nonspecific infectious or inflammatory cystitis. Correlate with urinalysis. 7. Status post hysterectomy and oophorectomy. Interval resolution of a previously noted left pelvic hematoma or seroma. 8. Trace, nonspecific free fluid in the low pelvis. Electronically Signed   By: Delanna Ahmadi M.D.   On: 11/08/2021 10:55   ECHOCARDIOGRAM COMPLETE  Result Date: 11/30/2021    ECHOCARDIOGRAM REPORT   Patient Name:   Tammie Gilmore Date of Exam: 11/30/2021 Medical Rec #:  [id number]         Height:       68.0 in Accession #:    [id number]        Weight:       129.4 lb Date of Birth:  05-28-1966        BSA:          1.698 m Patient Age:    56 years          BP:           134/83 mmHg Patient Gender: F                 HR:           90 bpm. Exam Location:  Inpatient Procedure: 2D Echo, 3D Echo and Strain Analysis Indications:     Chemotherapy  History:        Patient has prior history of Echocardiogram examinations, most                 recent 08/14/2021.  Sonographer:    Jefferey Pica Referring Phys: [id number] Bennet Kujawa [place]  1. Left ventricular ejection fraction, by estimation, is 40 to 45%. Left ventricular ejection fraction by 3D volume is 41 %. The left ventricle has mildly decreased function. The left ventricle has no regional wall motion abnormalities. Left ventricular  diastolic parameters are consistent with Grade I diastolic dysfunction (impaired relaxation).  2. Right ventricular systolic function is moderately reduced. The right ventricular size is normal.  3. The mitral valve is grossly normal. No evidence of mitral valve regurgitation.  4. The aortic valve is normal in structure. Aortic valve regurgitation is not visualized. No aortic stenosis is present. FINDINGS  Left Ventricle: Left ventricular ejection fraction, by estimation, is 40 to 45%. Left ventricular ejection fraction by 3D volume is 41 %. The left ventricle has mildly decreased function. The left ventricle has no regional wall motion abnormalities. The  left ventricular internal cavity size was normal in size. There is no left ventricular hypertrophy. Left ventricular diastolic parameters are consistent with Grade I diastolic dysfunction (impaired relaxation). Right Ventricle: The right ventricular size is normal. Right vetricular wall thickness was not well visualized. Right ventricular systolic function is moderately reduced. Left Atrium: Left atrial size was normal in size. Right Atrium: Right atrial size was normal in size. Pericardium: There is no evidence of pericardial effusion. Mitral Valve: The mitral valve is grossly normal. No evidence of mitral valve regurgitation. Tricuspid Valve: The tricuspid valve is normal in structure. Tricuspid valve regurgitation is not demonstrated. Aortic Valve: The aortic valve is normal in structure. Aortic valve  regurgitation is not visualized. No aortic stenosis is present. Aortic valve peak gradient measures 5.9 mmHg. Pulmonic Valve: The pulmonic valve was normal in structure. Pulmonic valve regurgitation is not visualized. Aorta: The aortic root and ascending aorta are structurally normal, with no evidence of dilitation. IAS/Shunts: The atrial septum is grossly normal.  LEFT VENTRICLE PLAX 2D LVIDd:         3.90 cm         Diastology LVIDs:         3.35 cm         LV e' medial:    6.60 cm/s LV PW:         1.00 cm         LV E/e' medial:  9.2 LV IVS:        0.90 cm         LV e' lateral:   7.43 cm/s                                LV E/e' lateral: 8.2  LV Volumes (MOD)               2D LV vol d, MOD    56.5 ml       Longitudinal A4C:                           Strain LV vol s, MOD    39.0 ml       2D Strain GLS  -16.5 % A4C:                           Avg: LV SV MOD A4C:   56.5 ml                                3D Volume EF                                LV 3D EF:    Left  ventricul                                             ar                                             ejection                                             fraction                                             by 3D                                             volume is                                             41 %.                                 3D Volume EF:                                3D EF:        41 %                                LV EDV:       97 ml                                LV ESV:       58 ml                                LV SV:        39 ml RIGHT VENTRICLE            IVC RV S prime:     8.10 cm/s  IVC diam: 1.50 cm TAPSE (M-mode): 1.3 cm LEFT ATRIUM             Index        RIGHT ATRIUM          Index LA diam:        2.20 cm 1.30 cm/m   RA Area:     7.59 cm LA Vol (A2C):   19.5 ml 11.48 ml/m  RA Volume:   12.80 ml 7.54 ml/m LA Vol (A4C):   18.8 ml 11.07 ml/m LA Biplane Vol: 20.2 ml 11.89  ml/m  AORTIC VALVE              PULMONIC VALVE AV Vmax:      121.00 cm/s PV Vmax:       0.99 m/s AV Peak Grad: 5.9 mmHg    PV Peak grad:  3.9 mmHg LVOT Vmax:    89.40 cm/s LVOT Vmean:   57.700 cm/s LVOT VTI:     0.144 m  AORTA Ao Root diam: 2.90 cm Ao Asc diam:  3.20 cm MITRAL VALVE MV Area (PHT): 3.28 cm    SHUNTS MV Decel Time: 231 msec    Systemic VTI: 0.14 m MV E velocity: 60.60 cm/s MV A velocity: 77.10 cm/s MV E/A ratio:  0.79 Mertie Moores MD Electronically signed by Mertie Moores MD Signature Date/Time: 11/30/2021/11:35:10 AM    Final

## 2021-12-01 ENCOUNTER — Telehealth: Payer: Self-pay | Admitting: Hematology and Oncology

## 2021-12-01 ENCOUNTER — Telehealth: Payer: Self-pay

## 2021-12-01 LAB — URINE CULTURE

## 2021-12-01 NOTE — Telephone Encounter (Signed)
Called and told urine culture is negative per Dr. Alvy Bimler.

## 2021-12-01 NOTE — Telephone Encounter (Signed)
.  Called patient to schedule appointment per 2/21 inbasket, patient is aware of date and time.   °

## 2021-12-02 ENCOUNTER — Telehealth (HOSPITAL_COMMUNITY): Payer: Self-pay | Admitting: Internal Medicine

## 2021-12-02 NOTE — Telephone Encounter (Signed)
Dr Alvy Bimler office called to f/u w/referral, please advise

## 2021-12-04 ENCOUNTER — Inpatient Hospital Stay: Payer: BC Managed Care – PPO | Admitting: Hematology and Oncology

## 2021-12-04 ENCOUNTER — Inpatient Hospital Stay: Payer: BC Managed Care – PPO

## 2021-12-04 NOTE — Telephone Encounter (Signed)
Appt sch for 12/17/21

## 2021-12-07 ENCOUNTER — Other Ambulatory Visit: Payer: Self-pay

## 2021-12-07 ENCOUNTER — Other Ambulatory Visit: Payer: Self-pay | Admitting: Hematology and Oncology

## 2021-12-07 ENCOUNTER — Inpatient Hospital Stay: Payer: BC Managed Care – PPO | Admitting: Hematology and Oncology

## 2021-12-07 ENCOUNTER — Inpatient Hospital Stay: Payer: BC Managed Care – PPO

## 2021-12-07 VITALS — BP 126/80 | HR 95 | Temp 97.4°F | Resp 18 | Ht 68.0 in | Wt 125.6 lb

## 2021-12-07 DIAGNOSIS — C55 Malignant neoplasm of uterus, part unspecified: Secondary | ICD-10-CM

## 2021-12-07 DIAGNOSIS — C7801 Secondary malignant neoplasm of right lung: Secondary | ICD-10-CM | POA: Diagnosis not present

## 2021-12-07 DIAGNOSIS — N390 Urinary tract infection, site not specified: Secondary | ICD-10-CM | POA: Diagnosis not present

## 2021-12-07 DIAGNOSIS — R634 Abnormal weight loss: Secondary | ICD-10-CM

## 2021-12-07 DIAGNOSIS — R300 Dysuria: Secondary | ICD-10-CM

## 2021-12-07 DIAGNOSIS — T451X5A Adverse effect of antineoplastic and immunosuppressive drugs, initial encounter: Secondary | ICD-10-CM

## 2021-12-07 DIAGNOSIS — I427 Cardiomyopathy due to drug and external agent: Secondary | ICD-10-CM | POA: Diagnosis not present

## 2021-12-07 DIAGNOSIS — C7802 Secondary malignant neoplasm of left lung: Secondary | ICD-10-CM

## 2021-12-07 DIAGNOSIS — Z5111 Encounter for antineoplastic chemotherapy: Secondary | ICD-10-CM | POA: Diagnosis not present

## 2021-12-07 LAB — CMP (CANCER CENTER ONLY)
ALT: 13 U/L (ref 0–44)
AST: 15 U/L (ref 15–41)
Albumin: 4 g/dL (ref 3.5–5.0)
Alkaline Phosphatase: 66 U/L (ref 38–126)
Anion gap: 5 (ref 5–15)
BUN: 11 mg/dL (ref 6–20)
CO2: 29 mmol/L (ref 22–32)
Calcium: 9.2 mg/dL (ref 8.9–10.3)
Chloride: 107 mmol/L (ref 98–111)
Creatinine: 0.67 mg/dL (ref 0.44–1.00)
GFR, Estimated: >60 mL/min (ref >60)
Glucose, Bld: 108 mg/dL — ABNORMAL HIGH (ref 70–99)
Potassium: 3.8 mmol/L (ref 3.5–5.1)
Sodium: 141 mmol/L (ref 135–145)
Total Bilirubin: 0.3 mg/dL (ref 0.3–1.2)
Total Protein: 6.8 g/dL (ref 6.5–8.1)

## 2021-12-07 LAB — URINALYSIS, COMPLETE (UACMP) WITH MICROSCOPIC
Bilirubin Urine: NEGATIVE
Glucose, UA: NEGATIVE mg/dL
Hgb urine dipstick: NEGATIVE
Ketones, ur: NEGATIVE mg/dL
Nitrite: POSITIVE — AB
Protein, ur: 100 mg/dL — AB
Specific Gravity, Urine: 1.019 (ref 1.005–1.030)
WBC, UA: >50 WBC/hpf — ABNORMAL HIGH (ref 0–5)
pH: 7 (ref 5.0–8.0)

## 2021-12-07 LAB — CBC WITH DIFFERENTIAL (CANCER CENTER ONLY)
Abs Immature Granulocytes: 0.02 10*3/uL (ref 0.00–0.07)
Basophils Absolute: 0.1 10*3/uL (ref 0.0–0.1)
Basophils Relative: 1 %
Eosinophils Absolute: 0.1 10*3/uL (ref 0.0–0.5)
Eosinophils Relative: 1 %
HCT: 33.6 % — ABNORMAL LOW (ref 36.0–46.0)
Hemoglobin: 11.5 g/dL — ABNORMAL LOW (ref 12.0–15.0)
Immature Granulocytes: 0 %
Lymphocytes Relative: 17 %
Lymphs Abs: 1 10*3/uL (ref 0.7–4.0)
MCH: 30.6 pg (ref 26.0–34.0)
MCHC: 34.2 g/dL (ref 30.0–36.0)
MCV: 89.4 fL (ref 80.0–100.0)
Monocytes Absolute: 0.7 10*3/uL (ref 0.1–1.0)
Monocytes Relative: 11 %
Neutro Abs: 4.4 10*3/uL (ref 1.7–7.7)
Neutrophils Relative %: 70 %
Platelet Count: 272 10*3/uL (ref 150–400)
RBC: 3.76 MIL/uL — ABNORMAL LOW (ref 3.87–5.11)
RDW: 15.1 % (ref 11.5–15.5)
WBC Count: 6.3 10*3/uL (ref 4.0–10.5)
nRBC: 0 % (ref 0.0–0.2)

## 2021-12-07 MED ORDER — SODIUM CHLORIDE 0.9 % IV SOLN: Freq: Once | INTRAVENOUS | Status: AC

## 2021-12-07 MED ORDER — SODIUM CHLORIDE 0.9 % IV SOLN
900.0000 mg/m2 | Freq: Once | INTRAVENOUS | Status: AC
  Administered 2021-12-07: 1520 mg via INTRAVENOUS
  Filled 2021-12-07: qty 39.98

## 2021-12-07 MED ORDER — SODIUM CHLORIDE 0.9 % IV SOLN: 8.0000 mg | Freq: Once | INTRAVENOUS | Status: DC

## 2021-12-07 MED ORDER — HEPARIN SOD (PORK) LOCK FLUSH 100 UNIT/ML IV SOLN
500.0000 [IU] | Freq: Once | INTRAVENOUS | Status: AC | PRN
  Administered 2021-12-07: 500 [IU]

## 2021-12-07 MED ORDER — SODIUM CHLORIDE 0.9% FLUSH
10.0000 mL | INTRAVENOUS | Status: DC | PRN
  Administered 2021-12-07: 10 mL

## 2021-12-07 MED ORDER — DEXAMETHASONE 4 MG PO TABS: 8.0000 mg | ORAL_TABLET | Freq: Every day | ORAL | 1 refills | Status: DC

## 2021-12-07 MED ORDER — SULFAMETHOXAZOLE-TRIMETHOPRIM 800-160 MG PO TABS
1.0000 | ORAL_TABLET | Freq: Two times a day (BID) | ORAL | 0 refills | Status: DC
Start: 2021-12-07 — End: 2021-12-14

## 2021-12-07 MED ORDER — SODIUM CHLORIDE 0.9% FLUSH
10.0000 mL | Freq: Once | INTRAVENOUS | Status: AC
  Administered 2021-12-07: 10 mL

## 2021-12-07 MED ORDER — ONDANSETRON HCL 4 MG/2ML IJ SOLN: 8.0000 mg | Freq: Once | INTRAMUSCULAR | Status: DC

## 2021-12-07 MED ORDER — ONDANSETRON HCL 4 MG/2ML IJ SOLN
8.0000 mg | Freq: Once | INTRAMUSCULAR | Status: AC
  Administered 2021-12-07: 8 mg via INTRAVENOUS
  Filled 2021-12-07: qty 4

## 2021-12-07 MED ORDER — PROCHLORPERAZINE MALEATE 10 MG PO TABS: 10.0000 mg | ORAL_TABLET | Freq: Once | ORAL | Status: DC

## 2021-12-07 NOTE — Progress Notes (Signed)
Patient confirmed that she took home compazine 10 mg dose at 11 am today for nausea. Pharmacy made aware and noted in [facility].

## 2021-12-07 NOTE — Patient Instructions (Addendum)
[facility] [facility]   ** You have been prescribed Bactrim to help resolve UTI. This has been sent to the [facility] on [address]. Please start taking the medication this evening as prescribed. You will be contacted about your urine culture results in the next few days. Please reach out with any questions or concerns ** Discharge Instructions: Thank you for choosing [facility] to provide your oncology and hematology care.   If you have a lab appointment with the [facility], please go directly to the [facility] and check in at the registration area.   Wear comfortable clothing and clothing appropriate for easy access to any Portacath or PICC line.   We strive to give you quality time with your provider. You may need to reschedule your appointment if you arrive late (15 or more minutes).  Arriving late affects you and other patients whose appointments are after yours.  Also, if you miss three or more appointments without notifying the office, you may be dismissed from the clinic at the providers discretion.      For prescription refill requests, have your pharmacy contact our office and allow 72 hours for refills to be completed.    Today you received the following chemotherapy and/or immunotherapy agents: Gemcitabine (Gemzar)      To help prevent nausea and vomiting after your treatment, we encourage you to take your nausea medication as directed.  BELOW ARE SYMPTOMS THAT SHOULD BE REPORTED IMMEDIATELY: *FEVER GREATER THAN 100.4 F (38 C) OR HIGHER *CHILLS OR SWEATING *NAUSEA AND VOMITING THAT IS NOT CONTROLLED WITH YOUR NAUSEA MEDICATION *UNUSUAL SHORTNESS OF BREATH *UNUSUAL BRUISING OR BLEEDING *URINARY PROBLEMS (pain or burning when urinating, or frequent urination) *BOWEL PROBLEMS (unusual diarrhea, constipation, pain near the anus) TENDERNESS IN MOUTH AND THROAT WITH OR WITHOUT PRESENCE OF ULCERS  (sore throat, sores in mouth, or a toothache) UNUSUAL RASH, SWELLING OR PAIN  UNUSUAL VAGINAL DISCHARGE OR ITCHING   Items with * indicate a potential emergency and should be followed up as soon as possible or go to the Emergency Department if any problems should occur.  Please show the CHEMOTHERAPY ALERT CARD or IMMUNOTHERAPY ALERT CARD at check-in to the Emergency Department and triage nurse.  Should you have questions after your visit or need to cancel or reschedule your appointment, please contact [facility]  Dept: [phone]  and follow the prompts.  Office hours are 8:00 a.m. to 4:30 p.m. Monday - Friday. Please note that voicemails left after 4:00 p.m. may not be returned until the following business day.  We are closed weekends and major holidays. You have access to a nurse at all times for urgent questions. Please call the main number to the clinic Dept: [phone] and follow the prompts.   For any non-urgent questions, you may also contact your provider using MyChart. We now offer e-Visits for anyone 34 and older to request care online for non-urgent symptoms. For details visit [email or web address].   Also download the MyChart app! Go to the app store, search "MyChart", open the app, select [facility], and log in with your MyChart username and password.  Due to Covid, a mask is required upon entering the hospital/clinic. If you do not have a mask, one will be given to you upon arrival. For doctor visits, patients may have 1 support person aged 22 or older with them. For treatment visits, patients cannot have  anyone with them due to current Covid guidelines and our immunocompromised population.

## 2021-12-08 ENCOUNTER — Encounter: Payer: Self-pay | Admitting: Hematology and Oncology

## 2021-12-08 ENCOUNTER — Telehealth: Payer: Self-pay | Admitting: *Deleted

## 2021-12-08 DIAGNOSIS — R634 Abnormal weight loss: Secondary | ICD-10-CM | POA: Insufficient documentation

## 2021-12-08 NOTE — Assessment & Plan Note (Signed)
I have reviewed recent echocardiogram with the patient and her husband and explained to them the rationale of not able to continue treatment with Adriamycin The next best line of treatment for her condition is combination of gemcitabine with Taxotere I discussed the risk, benefits, side effects of this treatment and reviewed side effects Despite her UTI symptoms, she has no fevers or leukocytosis I recommend we proceed with treatment without delay with additional antibiotic support I will see her again next week for further follow-up I reviewed with her the importance of taking premed dexamethasone prior to her next week's treatment and the role of supportive G-CSF

## 2021-12-08 NOTE — Progress Notes (Signed)
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[facility]) I have reviewed recent echocardiogram with the patient and her husband and explained to them the rationale of not able to continue treatment with Adriamycin The next best line of treatment for her condition is combination of gemcitabine with Taxotere I discussed the risk, benefits, side effects of this treatment and reviewed side effects Despite her UTI symptoms, she has no fevers or leukocytosis I recommend we proceed with treatment without delay with additional antibiotic support I will see her again next week for further follow-up I reviewed with her the importance of taking premed dexamethasone prior to her next week's treatment and the role of supportive G-CSF  UTI (urinary tract infection) She has symptoms of recurrent UTI I reviewed her recent urine culture which show 2 different microorganisms with different antibiotic sensitivities I will treat her empirically with Bactrim and will follow-up closely with the cultures  Chemotherapy induced cardiomyopathy ([place]) She has no clinical signs or symptoms of congestive heart failure I explained to the patient and her husband the rationale of cardiology follow-up and she is in agreement  Weight loss She had recent weight loss that could be precipitated by recurrent UTI We discussed the importance of frequent small meals I will readjust her treatment plan based on her current weight  No orders of the defined types were placed in this encounter.   All questions were answered. The patient knows to call the clinic with any problems, questions or concerns. The total time spent in the appointment was 40 minutes encounter with patients including review of chart and various tests results, discussions about plan of  care and coordination of care plan   [name], MD 12/08/2021 7:42 AM  INTERVAL HISTORY: Please see below for problem oriented charting. she returns for treatment follow-up with her husband Since last time I saw her, she started to have symptoms of cystitis with suprapubic discomfort, urinary frequency, dysuria and right-sided flank discomfort.  No fever or chills.  No hematuria She has lost weight and she claims she is not hungry No recent chest pain, shortness of breath or leg edema She had questions related to plan of care and side effects of new chemotherapy  REVIEW OF SYSTEMS:   Constitutional: Denies fevers, chills  Eyes: Denies blurriness of vision Ears, nose, mouth, throat, and face: Denies mucositis or sore throat Respiratory: Denies cough, dyspnea or wheezes Cardiovascular: Denies palpitation, chest discomfort or lower extremity swelling Gastrointestinal:  Denies nausea, heartburn or change in bowel habits Skin: Denies abnormal skin rashes Lymphatics: Denies new lymphadenopathy or easy bruising Neurological:Denies numbness, tingling or new weaknesses Behavioral/Psych: Mood is stable, no new changes  All other systems were reviewed with the patient and are negative.  I have reviewed the past medical history, past surgical history, social history and family history with the patient and they are unchanged from previous note.  ALLERGIES:  is allergic to doxycycline.  MEDICATIONS:  Current Outpatient Medications  Medication Sig Dispense Refill   ciprofloxacin (CIPRO) 250 MG tablet Take 1 tablet (250 mg total) by mouth 2 (two) times daily. 14 tablet 0   acetaminophen (TYLENOL) 500 MG tablet Take 1,000 mg by mouth every 6 (six) hours as needed for moderate pain or headache.     bisacodyl 5 MG EC tablet Take 1 tablet (5  mg total) by mouth daily as needed for moderate constipation. 30 tablet 3   dexamethasone (DECADRON) 4 MG tablet Take 2 tablets (8 mg total) by mouth daily.  Start the day before Taxotere. Then daily after chemo for 2 days. 30 tablet 1   lidocaine-prilocaine (EMLA) cream Apply to affected area once 30 g 3   LORazepam (ATIVAN) 0.5 MG tablet Take 1 tablet (0.5 mg total) by mouth 2 (two) times daily as needed for anxiety. 30 tablet 0   ondansetron (ZOFRAN) 8 MG tablet Take 1 tablet (8 mg total) by mouth every 8 (eight) hours as needed. 30 tablet 1   prochlorperazine (COMPAZINE) 10 MG tablet Take 1 tablet (10 mg total) by mouth every 6 (six) hours as needed (Nausea or vomiting). 90 tablet 1   sulfamethoxazole-trimethoprim (BACTRIM DS) 800-160 MG tablet Take 1 tablet by mouth 2 (two) times daily. 14 tablet 0   No current facility-administered medications for this visit.    SUMMARY OF ONCOLOGIC HISTORY: Oncology History  Uterine leiomyosarcoma ([place])  06/11/2021 Imaging   1. 9.5 x 7.6 x 9.0 cm complex, partially necrotic, mass involving the lower uterine segment/ cervix. No obvious direct extension into the parametrium.  2. 9 mm left pelvic sidewall lymph node is partially necrotic and worrisome for metastatic adenopathy.  3. No findings for abdominal omental or peritoneal surface disease or adenopathy.  4. Tiny low-attenuation lesion in the pancreatic head, likely benign cyst but attention on follow-up scans is suggested.  5. 2.9 cm fundal fibroid.    06/19/2021 Pathology Results   FINAL MICROSCOPIC DIAGNOSIS:   A. UTERINE, CERVICAL MASS, BIOPSY:  - Spindle cell malignancy.  - See comment.   COMMENT:  The biopsies consist of endocervical mucosa with stromal edema and one biopsy fragment has a microscopic focus with atypical spindle cells consistent with poorly differentiated malignancy.  The differential  includes a spindle cell malignancy such as sarcomatoid carcinoma and leiomyosarcoma.  Mullerian adenosarcoma is also a consideration but considered less likely   06/23/2021 Imaging   MR pelvis  10 cm uterine mass with central necrosis, which is  centered in the cervix and lower uterine segment. Right parametrial involvement is seen as well as suspected invasion of the distal rectum. Differential diagnosis includes cervical carcinoma and uterine leiomyosarcoma.   Mild bilateral iliac lymphadenopathy, highly suspicious for metastatic disease.   2.9 cm subserosal fibroid in the posterior fundus.   Normal appearance of both ovaries.     06/29/2021 PET scan   1. Hypermetabolic necrotic cervical/uterine mass with bilateral external iliac hypermetabolic lymph nodes. No evidence of distant metastatic disease. 2. 1.5 cm low-attenuation left thyroid nodule. Recommend thyroid ultrasound. (Ref: J Am Coll Radiol. 2015 Feb;12(2): 143-50).   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, pleomorphic, and myxoid areas and associated necrosis (~20%) - Tumor based in cervix and also involves lower uterine segment - Cervicovaginal margin involved by focal invasive leiomyosarcoma (3:00-5:00, A10) as well as tumor in lymphovascular spaces - Leiomyosarcoma involves right and left parametrial tissue and extends to parametrial margins - Extensive lymphovascular space invasion present, including in uterus and parametria - See synoptic report and comment   Other findings: - Leiomyomata with hyalinization, size up to 3.0 cm - Ovaries and fallopian tubes with no parenchymal involvement by leiomyosarcoma identified, although adnexal lymphovascular space invasion is present   B:  Lymph nodes, right pelvic, lymphadenectomy - One of four lymph nodes positive for metastatic leiomyosarcoma (1/4), with extracapsular extension present   C: Lymph nodes, left pelvic, lymphadenectomy - One of four lymph nodes positive for metastatic leiomyosarcoma (1/4), with extracapsular extension  present  Immunohistochemical stains are performed on block A11, and demonstrate that the tumor is positive for desmin and CD10, with SMA staining the majority of the spindle cell component but largely negative in the epithelioid / pleomorphic component. OSCAR, pancytokeratin AE1/AE3, HMB45, and PR appear negative in the tumor. ER shows patchy weak staining and myogenin stains rare cells. Block A23 also shows positive desmin and negative OSCAR pancytokeratin. Overall, the findings are most consistent with leiomyosarcoma, with extensive areas that are myxoid, epithelioid, and pleomorphic as well as more typical spindle cell areas within the overall high grade tumor (grade 3 / 3). The tumor is staged as pT2b (involves other pelvic tissues) given the parametrial involvement and pN1 for FIGO stage IIIC.    07/17/2021 Surgery   Date of Surgery: 07/17/21  Preoperative Diagnosis: High Grade Uterine Sarcoma  Postoperative Diagnosis: Same  Procedure(s): Bilateral - RADICAL ABDOMINAL HYSTER, W/BIL TOTAL PELVIC LYMPHADENECTOMY & PARA-AORTIC LYMPH NODE BX W/WO REM TUBE/OVAR VAGINAL HYSTERECTOMY, FOR UTERUS 250 G OR LESS; WITH REPAIR OF ENTEROCELE COLECTOMY, PARTIAL; WITH COLOPROCTOSTOMY (LOW PELVIC ANASTOMOSIS) WITH COLOSTOMY CYSTOURETHROSCOPY, WITH INSERTION OF INDWELLING URETERAL STENT (EG, GIBBONS OR DOUBLE-J TYPE) - Cystourethroscopy - Bilateral ureteral stent placement - Foley catheter placement  Performing Service: Gynecology Oncology Surgeon(s) and Role: Panel 1: * Lafonda Mosses, MD - Primary * Bernadene Bell, MD - Resident - Assisting * Devonne Doughty, MD - Resident - Assisting Panel 2: * Franchot Erichsen, MD - Primary  Drains:  - Left 6Fr open-ended ureteral access catheter (green) - Right 5Fr open-ended ureteral access catheter (white) - 16Fr foley catheter to drainage  * No implants in log *  Indications: 56 y.o. female with high grade uterine sarcoma. Urology was  consulted pre-operatively for placement of bilateral ureteral stents. Risks, benefits, and alternatives of the above procedure were discussed and informed consent was signed.  OperativeFindings:  - Grossly distorted architecture of urinary bladder likely 2/2 pelvic mass with anterolaterally positioned UOs - Successful placement of bilateral open-ended ureteral catheters under direct visualization - Foley catheter placed at case conclusion  Description: The patient was correctly identified in the preop holding area where written informed consent as well potential risk and complication reviewed. She agreed. The patient was brought to the operative suite where a preinduction timeout was performed. Once correct information was verified, general anesthesia was induced. The patient was then gently placed into dorsal lithotomy position with SCDs in place for VTE prophylaxis. They were prepped and draped in the usual sterile fashion and given appropriate preoperative antibiotics. A second timeout was then performed.   We inserted a 73F rigid cystoscope per urethra with copious lubrication and normal saline irrigation running. We performed cystourethroscopy, which revealed the above findings.  We turned our attention to the left ureteral orifice and canulated it with a sensor wire, using assistance of a 6Fr open-ended catheter. The wire was advanced into the renal pelvis without difficulty under visual guidance. We then advanced the stent over our wire into the renal pelvis under direct visualization and feel without complication. The wire was subsequently removed.   We then turned our attention to the right ureteral orifice and canulated it with a sensor wire, using assistance of a 5Fr open-ended catheter.  The wire was advanced into the renal pelvis without difficulty under visual guidance. We then advanced the stent over our wire into the renal pelvis under direct visualization and feel without complication.  The wire was subsequently removed.   A 16Fr straight catheter was placed, with return of urine indicating appropriate position within the bladder. The balloon was inflated with 10cc sterile water. The stents were secured to the Foley using 0-silk ties, being careful not to occlude the stents or Foley.   The patient was awoken from general anesthesia having tolerated the procedure well and taken to the PACU for routine post-operative recovery.  Post-Op Plan:  - Foley and stents per primary team    07/17/2021 Surgery   Date of Surgery: 07/17/2021  Pre-op Diagnosis: Uterine spindle cell malignancy  Post-op Diagnosis: Same  Procedure(s): Panel 1 RADICAL ABDOMINAL HYSTER, with bilateral S&O, vagineconty upper, bilateral pelvic lyphadenectomy, bilateral ureterolysis,: 16109 (CPT) Panel 2 CYSTOURETHROSCOPY, WITH INSERTION OF INDWELLING URETERAL STENT (EG, GIBBONS OR DOUBLE-J TYPE): 60454 (CPT) Note: Revisions to procedures should be made in chart - see Procedures activity.  Performing Service: Gynecology Oncology Surgeon(s) and Role: Panel 1: * Lafonda Mosses, MD - Primary * Bernadene Bell, MD - Resident - Assisting * Devonne Doughty, MD - Resident - Assisting Panel 2: * Franchot Erichsen, MD - Primary  Findings: On bimanual exam, 10cm necrotic mass filling upper vagina, unable to discretely palpate the cervix. On rectovaginal exam, rectal involvement not identified. Intraoperatively, normal upper abdominal survey including normal liver, diaphragm, stomach, omentum and bowel. Small uterus with 10cm mass expanding the cervix. Palpably enlarged bilateral pelvic lymph nodes adherent to the external iliac veins and obturator nerves, removed. No palpable para-aortic lymphadenopathy. No rectal involvement of uterine mass.   Specimens:  ID Type Source Tests Collected by Time Destination  1 : uterus,cervix,bilateral tubes/ovaries Tissue Uterus SURGICAL PATHOLOGY EXAM Lafonda Mosses, MD 07/17/2021 0932  2 : right pelvic lymph node Tissue Lymph Node SURGICAL PATHOLOGY EXAM Lafonda Mosses, MD 07/17/2021 1125  3 : LEFT PELVIC LN Tissue Lymph Node SURGICAL PATHOLOGY EXAM Lafonda Mosses, MD 07/17/2021 1144    08/06/2021 Initial Diagnosis   Uterine leiomyosarcoma ([place])   08/06/2021 Cancer Staging   Staging form: Corpus Uteri - Leiomyosarcoma and Endometrial Stromal Sarcoma, AJCC 8th Edition - Pathologic stage from 08/06/2021: FIGO Stage IVB (pT3, pN1, cM1) - Signed by [name], MD on 08/11/2021 Stage prefix: Initial diagnosis    08/10/2021 Imaging   CT abdomen and pelvis 1. Interval development of left lobe pulmonary nodules, measuring up to 7 mm and highly for metastatic disease. 2. Interval development of small to upper normal lymph nodes in the pelvis, concerning for metastatic disease. 3. Postoperative seroma left pelvic sidewall. 4. Tiny cluster of tree-in-bud opacity in the peripheral right lower lobe is new and compatible with sequelae of atypical infection.   08/13/2021 Procedure   Procedure: Placement of a right IJ approach single lumen PowerPort.  Tip is positioned at the superior cavoatrial junction and catheter is ready for immediate use.  Complications: No immediate   08/14/2021 Echocardiogram    1. Left ventricular ejection fraction, by estimation, is 60 to 65%. The left ventricle has normal function. The left ventricle has no regional wall motion abnormalities. Left ventricular diastolic parameters were normal. The average left ventricular global longitudinal strain is -17.4 %. The global longitudinal strain is normal.  2. Right ventricular systolic function is normal. The right ventricular size is normal.  3. The mitral valve is normal in structure. No evidence of mitral valve regurgitation. No evidence of mitral stenosis.  4. The aortic valve is tricuspid. Aortic valve regurgitation is not visualized. No aortic stenosis is present.  5. The  inferior vena cava is normal in size with greater than 50% respiratory variability, suggesting right atrial pressure of 3 mmHg.     08/17/2021 Imaging   Multiple new and enlarging pulmonary nodules scattered throughout the lungs bilaterally, highly concerning for progressive metastatic disease to the lungs   08/18/2021 - 11/10/2021 Chemotherapy   Patient is on Treatment Plan : UTERINE LEIOMYOSARCOMA Doxorubicin q21d x 6 Cycles     11/09/2021 Imaging   IMPRESSION: 1. Multiple small bilateral pulmonary nodules, some of which are slightly increased in size. Other nodules unchanged. 2. Interval decrease in size of left pelvic sidewall lymph nodes. 3. Unchanged size of perirectal lymph nodes or soft tissue nodules. These however demonstrate new internal hypodensity, suggesting treatment response and internal necrosis. 4. Unchanged left iliac lymph node or peritoneal nodule. 5. Findings are consistent with mixed response to treatment. No evidence of new metastatic disease in the chest, abdomen, or pelvis. 6. Wall thickening and mucosal hyperenhancement of the bladder, consistent with nonspecific infectious or inflammatory cystitis. Correlate with urinalysis. 7. Status post hysterectomy and oophorectomy. Interval resolution of a previously noted left pelvic hematoma or seroma. 8. Trace, nonspecific free fluid in the low pelvis.   11/30/2021 Echocardiogram    1. Left ventricular ejection fraction, by estimation, is 40 to 45%. Left ventricular ejection fraction by 3D volume is 41 %. The left ventricle has mildly decreased function. The left ventricle has no regional wall motion abnormalities. Left ventricular  diastolic parameters are consistent with Grade I diastolic dysfunction (impaired relaxation).  2. Right ventricular systolic function is moderately reduced. The right ventricular size is normal.  3. The mitral valve is grossly normal. No evidence of mitral valve regurgitation.  4. The aortic valve is  normal in structure. Aortic valve regurgitation is not visualized. No aortic stenosis is present.     12/07/2021 -  Chemotherapy   Patient is on Treatment Plan : UTERINE UNDIFFERENTIATED / LEIOMYOSARCOMA Gemcitabine D1,8 + Docetaxel D8 (900/100) q21d     Pulmonary metastases ([place])  08/11/2021 Initial Diagnosis   Pulmonary metastases ([place])   08/18/2021 - 11/10/2021 Chemotherapy   Patient is on Treatment Plan : UTERINE LEIOMYOSARCOMA Doxorubicin q21d x 6 Cycles     11/09/2021 Imaging   IMPRESSION: 1. Multiple small bilateral pulmonary nodules, some of which are slightly increased in size. Other nodules unchanged. 2. Interval decrease in size of left pelvic sidewall lymph nodes. 3. Unchanged size of perirectal lymph nodes or soft tissue nodules. These however demonstrate new internal hypodensity, suggesting treatment response and internal necrosis. 4. Unchanged left iliac lymph node or peritoneal nodule. 5. Findings are consistent with mixed response to treatment. No evidence of new metastatic disease in the chest, abdomen, or pelvis. 6. Wall thickening and mucosal hyperenhancement of the bladder, consistent with nonspecific infectious or inflammatory cystitis. Correlate with urinalysis. 7. Status post hysterectomy and oophorectomy. Interval resolution of a previously noted left pelvic hematoma or seroma. 8. Trace, nonspecific free fluid in the low pelvis.   12/07/2021 -  Chemotherapy   Patient is on Treatment Plan : UTERINE UNDIFFERENTIATED / LEIOMYOSARCOMA Gemcitabine D1,8 + Docetaxel D8 (900/100) q21d       PHYSICAL EXAMINATION: ECOG PERFORMANCE STATUS: 1 - Symptomatic but completely ambulatory  Vitals:   12/07/21 1145  BP: 126/80  Pulse: 95  Resp: 18  Temp: (!) 97.4 F (36.3 C)  SpO2: 100%   Filed Weights   12/07/21 1145  Weight: 125 lb 9.6 oz (57 kg)    GENERAL:alert, no distress and comfortable SKIN: skin color, texture, turgor are normal, no rashes or significant  lesions EYES: normal, Conjunctiva are pink and non-injected, sclera clear OROPHARYNX:no exudate, no erythema and lips, buccal mucosa, and tongue normal  NECK: supple, thyroid normal size, non-tender, without nodularity LYMPH:  no palpable lymphadenopathy in the cervical, axillary or inguinal LUNGS: clear to auscultation and percussion with normal breathing effort HEART: regular rate & rhythm and no murmurs and no lower extremity edema ABDOMEN:abdomen soft, mild suprapubic discomfort Musculoskeletal:no cyanosis of digits and no clubbing  NEURO: alert & oriented x 3 with fluent speech, no focal motor/sensory deficits  LABORATORY DATA:  I have reviewed the data as listed    Component Value Date/Time   NA 141 12/07/2021 1138   K 3.8 12/07/2021 1138   CL 107 12/07/2021 1138   CO2 29 12/07/2021 1138   GLUCOSE 108 (H) 12/07/2021 1138   BUN 11 12/07/2021 1138   CREATININE 0.67 12/07/2021 1138   CALCIUM 9.2 12/07/2021 1138   PROT 6.8 12/07/2021 1138   ALBUMIN 4.0 12/07/2021 1138   AST 15 12/07/2021 1138   ALT 13 12/07/2021 1138   ALKPHOS 66 12/07/2021 1138   BILITOT 0.3 12/07/2021 1138   GFRNONAA >60 12/07/2021 1138    No results found for: SPEP, UPEP  Lab Results  Component Value Date   WBC 6.3 12/07/2021   NEUTROABS 4.4 12/07/2021   HGB 11.5 (L) 12/07/2021   HCT 33.6 (L) 12/07/2021   MCV 89.4 12/07/2021   PLT 272 12/07/2021      Chemistry      Component Value Date/Time   NA 141 12/07/2021 1138   K 3.8 12/07/2021 1138   CL 107 12/07/2021 1138   CO2 29 12/07/2021 1138   BUN 11 12/07/2021 1138   CREATININE 0.67 12/07/2021 1138      Component Value Date/Time   CALCIUM 9.2 12/07/2021 1138   ALKPHOS 66 12/07/2021 1138   AST 15 12/07/2021 1138   ALT 13 12/07/2021 1138   BILITOT 0.3 12/07/2021 1138       RADIOGRAPHIC STUDIES: I have personally reviewed the radiological images as listed and agreed with the findings in the report. ECHOCARDIOGRAM COMPLETE  Result  Date: 11/30/2021    ECHOCARDIOGRAM REPORT   Patient Name:   Tammie Gilmore Date of Exam: 11/30/2021 Medical Rec #:  [id number]         Height:       68.0 in Accession #:    [id number]        Weight:       129.4 lb Date of Birth:  Jul 26, 1966        BSA:          1.698 m Patient Age:    24 years          BP:           134/83 mmHg Patient Gender: F                 HR:           90 bpm. Exam Location:  Inpatient Procedure: 2D Echo, 3D Echo and Strain Analysis Indications:    Chemotherapy  History:        Patient has prior history of Echocardiogram examinations,  most                 recent 08/14/2021.  Sonographer:    Jefferey Pica Referring Phys: [id number] Charyl Minervini [place]  1. Left ventricular ejection fraction, by estimation, is 40 to 45%. Left ventricular ejection fraction by 3D volume is 41 %. The left ventricle has mildly decreased function. The left ventricle has no regional wall motion abnormalities. Left ventricular  diastolic parameters are consistent with Grade I diastolic dysfunction (impaired relaxation).  2. Right ventricular systolic function is moderately reduced. The right ventricular size is normal.  3. The mitral valve is grossly normal. No evidence of mitral valve regurgitation.  4. The aortic valve is normal in structure. Aortic valve regurgitation is not visualized. No aortic stenosis is present. FINDINGS  Left Ventricle: Left ventricular ejection fraction, by estimation, is 40 to 45%. Left ventricular ejection fraction by 3D volume is 41 %. The left ventricle has mildly decreased function. The left ventricle has no regional wall motion abnormalities. The  left ventricular internal cavity size was normal in size. There is no left ventricular hypertrophy. Left ventricular diastolic parameters are consistent with Grade I diastolic dysfunction (impaired relaxation). Right Ventricle: The right ventricular size is normal. Right vetricular wall thickness was not well visualized. Right ventricular  systolic function is moderately reduced. Left Atrium: Left atrial size was normal in size. Right Atrium: Right atrial size was normal in size. Pericardium: There is no evidence of pericardial effusion. Mitral Valve: The mitral valve is grossly normal. No evidence of mitral valve regurgitation. Tricuspid Valve: The tricuspid valve is normal in structure. Tricuspid valve regurgitation is not demonstrated. Aortic Valve: The aortic valve is normal in structure. Aortic valve regurgitation is not visualized. No aortic stenosis is present. Aortic valve peak gradient measures 5.9 mmHg. Pulmonic Valve: The pulmonic valve was normal in structure. Pulmonic valve regurgitation is not visualized. Aorta: The aortic root and ascending aorta are structurally normal, with no evidence of dilitation. IAS/Shunts: The atrial septum is grossly normal.  LEFT VENTRICLE PLAX 2D LVIDd:         3.90 cm         Diastology LVIDs:         3.35 cm         LV e' medial:    6.60 cm/s LV PW:         1.00 cm         LV E/e' medial:  9.2 LV IVS:        0.90 cm         LV e' lateral:   7.43 cm/s                                LV E/e' lateral: 8.2  LV Volumes (MOD)               2D LV vol d, MOD    56.5 ml       Longitudinal A4C:                           Strain LV vol s, MOD    39.0 ml       2D Strain GLS  -16.5 % A4C:                           Avg: LV SV MOD A4C:  56.5 ml                                3D Volume EF                                LV 3D EF:    Left                                             ventricul                                             ar                                             ejection                                             fraction                                             by 3D                                             volume is                                             41 %.                                 3D Volume EF:                                3D EF:        41 %                                LV EDV:        97 ml                                LV ESV:       58 ml                                LV SV:        39 ml RIGHT VENTRICLE  IVC RV S prime:     8.10 cm/s  IVC diam: 1.50 cm TAPSE (M-mode): 1.3 cm LEFT ATRIUM             Index        RIGHT ATRIUM          Index LA diam:        2.20 cm 1.30 cm/m   RA Area:     7.59 cm LA Vol (A2C):   19.5 ml 11.48 ml/m  RA Volume:   12.80 ml 7.54 ml/m LA Vol (A4C):   18.8 ml 11.07 ml/m LA Biplane Vol: 20.2 ml 11.89 ml/m  AORTIC VALVE              PULMONIC VALVE AV Vmax:      121.00 cm/s PV Vmax:       0.99 m/s AV Peak Grad: 5.9 mmHg    PV Peak grad:  3.9 mmHg LVOT Vmax:    89.40 cm/s LVOT Vmean:   57.700 cm/s LVOT VTI:     0.144 m  AORTA Ao Root diam: 2.90 cm Ao Asc diam:  3.20 cm MITRAL VALVE MV Area (PHT): 3.28 cm    SHUNTS MV Decel Time: 231 msec    Systemic VTI: 0.14 m MV E velocity: 60.60 cm/s MV A velocity: 77.10 cm/s MV E/A ratio:  0.79 Mertie Moores MD Electronically signed by Mertie Moores MD Signature Date/Time: 11/30/2021/11:35:10 AM    Final

## 2021-12-08 NOTE — Telephone Encounter (Signed)
Spoke with pt for chemo f/u call.  Per pt, she is doing fine today.  Had mild nausea yesterday,  took antiemetics as instructed with relief.  Denied nausea/vomiting today, eating and drinking fluids fine.  Reinforced increased po fluids intake as tolerated.  Bowel and bladder function fine.  Has UTI and started antibiotic last night.  No further concerns or questions at this time.  Pt aware of next f/u appt with Dr. Alvy Bimler on  12/14/21.

## 2021-12-08 NOTE — Assessment & Plan Note (Signed)
She has symptoms of recurrent UTI I reviewed her recent urine culture which show 2 different microorganisms with different antibiotic sensitivities I will treat her empirically with Bactrim and will follow-up closely with the cultures

## 2021-12-08 NOTE — Assessment & Plan Note (Signed)
She has no clinical signs or symptoms of congestive heart failure I explained to the patient and her husband the rationale of cardiology follow-up and she is in agreement

## 2021-12-08 NOTE — Assessment & Plan Note (Signed)
She had recent weight loss that could be precipitated by recurrent UTI We discussed the importance of frequent small meals I will readjust her treatment plan based on her current weight

## 2021-12-09 ENCOUNTER — Telehealth: Payer: Self-pay | Admitting: Oncology

## 2021-12-09 NOTE — Telephone Encounter (Signed)
Called Tammie Gilmore to see how she is feeling.  She reports taking the Bactrim as directed and is feeling better.  She said the urinary frequency and dysuria are improving. ?

## 2021-12-11 ENCOUNTER — Telehealth: Payer: Self-pay

## 2021-12-11 ENCOUNTER — Encounter: Payer: Self-pay | Admitting: Hematology and Oncology

## 2021-12-11 ENCOUNTER — Other Ambulatory Visit: Payer: Self-pay | Admitting: Hematology and Oncology

## 2021-12-11 DIAGNOSIS — C55 Malignant neoplasm of uterus, part unspecified: Secondary | ICD-10-CM

## 2021-12-11 DIAGNOSIS — C7801 Secondary malignant neoplasm of right lung: Secondary | ICD-10-CM

## 2021-12-11 DIAGNOSIS — N3090 Cystitis, unspecified without hematuria: Secondary | ICD-10-CM

## 2021-12-11 MED FILL — Dexamethasone Sodium Phosphate Inj 100 MG/10ML: INTRAMUSCULAR | Qty: 1 | Status: AC

## 2021-12-11 NOTE — Telephone Encounter (Signed)
Called and given below message. She verbalized understanding. 

## 2021-12-11 NOTE — Telephone Encounter (Signed)
The antibiotic prescribe should treat her UTI ?I will add repeat UA and UCx ?If she feels worse, she needs to go to the ER ?

## 2021-12-11 NOTE — Telephone Encounter (Signed)
She called and left a message. She has been having some low grade fever. Back pain and bladder pain is better. She has been using a heating pad and taking tylenol prn. She thinks she is feeling better, she just wants to check in. ? ?Next appt is 3/6. She is asking if you think she needs a different antibiotic? Or continue the Bactrim? ?

## 2021-12-14 ENCOUNTER — Other Ambulatory Visit: Payer: Self-pay

## 2021-12-14 ENCOUNTER — Other Ambulatory Visit: Payer: Self-pay | Admitting: Hematology and Oncology

## 2021-12-14 ENCOUNTER — Inpatient Hospital Stay: Payer: BC Managed Care – PPO | Attending: Gynecologic Oncology

## 2021-12-14 ENCOUNTER — Inpatient Hospital Stay (HOSPITAL_BASED_OUTPATIENT_CLINIC_OR_DEPARTMENT_OTHER): Payer: BC Managed Care – PPO | Admitting: Hematology and Oncology

## 2021-12-14 ENCOUNTER — Inpatient Hospital Stay: Payer: BC Managed Care – PPO

## 2021-12-14 ENCOUNTER — Encounter: Payer: Self-pay | Admitting: Hematology and Oncology

## 2021-12-14 VITALS — BP 127/82 | HR 91 | Resp 16

## 2021-12-14 DIAGNOSIS — C7800 Secondary malignant neoplasm of unspecified lung: Secondary | ICD-10-CM | POA: Diagnosis not present

## 2021-12-14 DIAGNOSIS — K1231 Oral mucositis (ulcerative) due to antineoplastic therapy: Secondary | ICD-10-CM | POA: Diagnosis not present

## 2021-12-14 DIAGNOSIS — N390 Urinary tract infection, site not specified: Secondary | ICD-10-CM | POA: Insufficient documentation

## 2021-12-14 DIAGNOSIS — N3090 Cystitis, unspecified without hematuria: Secondary | ICD-10-CM

## 2021-12-14 DIAGNOSIS — Z79899 Other long term (current) drug therapy: Secondary | ICD-10-CM | POA: Insufficient documentation

## 2021-12-14 DIAGNOSIS — C55 Malignant neoplasm of uterus, part unspecified: Secondary | ICD-10-CM

## 2021-12-14 DIAGNOSIS — I427 Cardiomyopathy due to drug and external agent: Secondary | ICD-10-CM

## 2021-12-14 DIAGNOSIS — D61818 Other pancytopenia: Secondary | ICD-10-CM | POA: Insufficient documentation

## 2021-12-14 DIAGNOSIS — Z5111 Encounter for antineoplastic chemotherapy: Secondary | ICD-10-CM | POA: Diagnosis present

## 2021-12-14 DIAGNOSIS — Z7952 Long term (current) use of systemic steroids: Secondary | ICD-10-CM | POA: Diagnosis not present

## 2021-12-14 DIAGNOSIS — R21 Rash and other nonspecific skin eruption: Secondary | ICD-10-CM | POA: Diagnosis not present

## 2021-12-14 DIAGNOSIS — T451X5A Adverse effect of antineoplastic and immunosuppressive drugs, initial encounter: Secondary | ICD-10-CM

## 2021-12-14 DIAGNOSIS — R634 Abnormal weight loss: Secondary | ICD-10-CM

## 2021-12-14 DIAGNOSIS — I428 Other cardiomyopathies: Secondary | ICD-10-CM | POA: Insufficient documentation

## 2021-12-14 DIAGNOSIS — R7989 Other specified abnormal findings of blood chemistry: Secondary | ICD-10-CM | POA: Insufficient documentation

## 2021-12-14 DIAGNOSIS — C7801 Secondary malignant neoplasm of right lung: Secondary | ICD-10-CM

## 2021-12-14 DIAGNOSIS — R748 Abnormal levels of other serum enzymes: Secondary | ICD-10-CM | POA: Diagnosis not present

## 2021-12-14 DIAGNOSIS — C7802 Secondary malignant neoplasm of left lung: Secondary | ICD-10-CM

## 2021-12-14 DIAGNOSIS — J019 Acute sinusitis, unspecified: Secondary | ICD-10-CM | POA: Diagnosis not present

## 2021-12-14 LAB — CMP (CANCER CENTER ONLY)
ALT: 173 U/L — ABNORMAL HIGH (ref 0–44)
AST: 73 U/L — ABNORMAL HIGH (ref 15–41)
Albumin: 4.4 g/dL (ref 3.5–5.0)
Alkaline Phosphatase: 113 U/L (ref 38–126)
Anion gap: 10 (ref 5–15)
BUN: 16 mg/dL (ref 6–20)
CO2: 23 mmol/L (ref 22–32)
Calcium: 9.9 mg/dL (ref 8.9–10.3)
Chloride: 104 mmol/L (ref 98–111)
Creatinine: 0.79 mg/dL (ref 0.44–1.00)
GFR, Estimated: >60 mL/min (ref >60)
Glucose, Bld: 110 mg/dL — ABNORMAL HIGH (ref 70–99)
Potassium: 3.9 mmol/L (ref 3.5–5.1)
Sodium: 137 mmol/L (ref 135–145)
Total Bilirubin: 0.3 mg/dL (ref 0.3–1.2)
Total Protein: 7.6 g/dL (ref 6.5–8.1)

## 2021-12-14 LAB — URINALYSIS, COMPLETE (UACMP) WITH MICROSCOPIC
Bilirubin Urine: NEGATIVE
Glucose, UA: NEGATIVE mg/dL
Hgb urine dipstick: NEGATIVE
Ketones, ur: 5 mg/dL — AB
Leukocytes,Ua: NEGATIVE
Nitrite: NEGATIVE
Protein, ur: 30 mg/dL — AB
Specific Gravity, Urine: 1.032 — ABNORMAL HIGH (ref 1.005–1.030)
pH: 5 (ref 5.0–8.0)

## 2021-12-14 LAB — CBC WITH DIFFERENTIAL (CANCER CENTER ONLY)
Abs Immature Granulocytes: 0 10*3/uL (ref 0.00–0.07)
Basophils Absolute: 0 10*3/uL (ref 0.0–0.1)
Basophils Relative: 0 %
Eosinophils Absolute: 0 10*3/uL (ref 0.0–0.5)
Eosinophils Relative: 0 %
HCT: 31.3 % — ABNORMAL LOW (ref 36.0–46.0)
Hemoglobin: 10.7 g/dL — ABNORMAL LOW (ref 12.0–15.0)
Immature Granulocytes: 0 %
Lymphocytes Relative: 17 %
Lymphs Abs: 0.8 10*3/uL (ref 0.7–4.0)
MCH: 30.6 pg (ref 26.0–34.0)
MCHC: 34.2 g/dL (ref 30.0–36.0)
MCV: 89.4 fL (ref 80.0–100.0)
Monocytes Absolute: 0.3 10*3/uL (ref 0.1–1.0)
Monocytes Relative: 7 %
Neutro Abs: 3.4 10*3/uL (ref 1.7–7.7)
Neutrophils Relative %: 76 %
Platelet Count: 158 10*3/uL (ref 150–400)
RBC: 3.5 MIL/uL — ABNORMAL LOW (ref 3.87–5.11)
RDW: 13.7 % (ref 11.5–15.5)
WBC Count: 4.4 10*3/uL (ref 4.0–10.5)
nRBC: 0 % (ref 0.0–0.2)

## 2021-12-14 MED ORDER — SODIUM CHLORIDE 0.9 % IV SOLN
900.0000 mg/m2 | Freq: Once | INTRAVENOUS | Status: AC
  Administered 2021-12-14: 1482 mg via INTRAVENOUS
  Filled 2021-12-14: qty 38.98

## 2021-12-14 MED ORDER — SODIUM CHLORIDE 0.9% FLUSH
10.0000 mL | Freq: Once | INTRAVENOUS | Status: AC
  Administered 2021-12-14: 10 mL

## 2021-12-14 MED ORDER — SODIUM CHLORIDE 0.9 % IV SOLN
10.0000 mg | Freq: Once | INTRAVENOUS | Status: AC
  Administered 2021-12-14: 10 mg via INTRAVENOUS
  Filled 2021-12-14: qty 10

## 2021-12-14 MED ORDER — SODIUM CHLORIDE 0.9 % IV SOLN: Freq: Once | INTRAVENOUS | Status: AC

## 2021-12-14 MED ORDER — LORAZEPAM 0.5 MG PO TABS: 0.5000 mg | ORAL_TABLET | Freq: Two times a day (BID) | ORAL | 0 refills | Status: DC | PRN

## 2021-12-14 MED ORDER — PROCHLORPERAZINE MALEATE 10 MG PO TABS
10.0000 mg | ORAL_TABLET | Freq: Once | ORAL | Status: AC
  Administered 2021-12-14: 10 mg via ORAL
  Filled 2021-12-14: qty 1

## 2021-12-14 MED ORDER — SODIUM CHLORIDE 0.9 % IV SOLN
80.0000 mg/m2 | Freq: Once | INTRAVENOUS | Status: AC
  Administered 2021-12-14: 130 mg via INTRAVENOUS
  Filled 2021-12-14: qty 13

## 2021-12-14 NOTE — Progress Notes (Signed)
Patient experienced one episode of dry heaving with some mucus noted. Patient had c/o of post-nasal drip symptoms that had contributed to her nausea prior to initiation of treatment. Pt received dexmethasone as pre-medication and RN confirmed with pharmacist that this would provide similar antiemetic effect as compazine.  ?During episode, Dr. Alvy Bimler and Hassan Rowan, [place] notified. Received orders for compazine PO and new home prescription for PRN ativan to patient's pharmacy. Pt provided with compazine and notified of new prescription. Pt tolerated rest of infusion with no issues.  ?

## 2021-12-14 NOTE — Assessment & Plan Note (Signed)
She has no clinical signs or symptoms of congestive heart failure ?She is scheduled to see cardiologist soon for evaluation ?I plan to repeat echocardiogram in 3 months, possibly sooner if cardiologist recommends ?

## 2021-12-14 NOTE — Progress Notes (Signed)
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[place]) Overall, she had developed several complications from treatment Her UTI symptom has resolved She have developed rare skin rash of unknown etiology She has elevated LFT likely due to recent treatment She has lost some weight We will proceed with treatment today with minor dose adjustment of Taxotere I reminded her to take dexamethasone for 2 days after treatment along with G-CSF support in 2 days I recommend minimum 3 cycles of treatment before repeating imaging study  UTI (urinary tract infection) She has recent recurrent UTI of different bacteria It raises a question whether she might have fistula I will order repeat urinalysis and urine culture today If she has recurrent UTI again, I will refer her to infectious disease for further evaluation  Chemotherapy induced cardiomyopathy ([place]) She has no clinical signs or symptoms of congestive heart failure She is scheduled to see cardiologist soon for evaluation I plan to repeat echocardiogram in 3 months, possibly sooner if cardiologist recommends  Weight loss She has mild progressive weight loss I will adjust the dose of her treatment accordingly  Skin rash She has mild skin rash around her neck and in the groin region It is resolving It is not clear whether this is due to Bactrim allergy The distribution of the rash is not consistent with drug rash Observe only for now  Elevated liver enzymes She has mild elevated liver enzymes Plan to reduce the dose of Taxotere a little bit We will proceed with treatment without delay  No orders of the defined types were placed in this encounter.   All questions were answered. The patient knows to call the clinic with any problems, questions or concerns. The  total time spent in the appointment was 40 minutes encounter with patients including review of chart and various tests results, discussions about plan of care and coordination of care plan   [name], MD 12/14/2021 12:45 PM  INTERVAL HISTORY: Please see below for problem oriented charting. she returns for treatment follow-up seen prior to cycle 1 day 8 of treatment Since last time I saw her, her symptoms of cystitis has improved She has lost some weight but overall, she denies nausea or vomiting She developed skin rash approximately 3 days ago near her neck in her groin, improving while on dexamethasone No recent fever or chills She denies chest pain, shortness of breath or leg edema  REVIEW OF SYSTEMS:   Constitutional: Denies fevers, chills or abnormal weight loss Eyes: Denies blurriness of vision Ears, nose, mouth, throat, and face: Denies mucositis or sore throat Respiratory: Denies cough, dyspnea or wheezes Cardiovascular: Denies palpitation, chest discomfort or lower extremity swelling Gastrointestinal:  Denies nausea, heartburn or change in bowel habits Lymphatics: Denies new lymphadenopathy or easy bruising Neurological:Denies numbness, tingling or new weaknesses Behavioral/Psych: Mood is stable, no new changes  All other systems were reviewed with the patient and are negative.  I have reviewed the past medical history, past surgical history, social history and family history with the patient and they are unchanged from previous note.  ALLERGIES:  is allergic to doxycycline.  MEDICATIONS:  Current Outpatient Medications  Medication Sig Dispense Refill   acetaminophen (TYLENOL) 500 MG tablet Take 1,000 mg by mouth every 6 (six) hours as needed for moderate pain or headache.  bisacodyl 5 MG EC tablet Take 1 tablet (5 mg total) by mouth daily as needed for moderate constipation. 30 tablet 3   dexamethasone (DECADRON) 4 MG tablet Take 2 tablets (8 mg total) by mouth daily.  Start the day before Taxotere. Then daily after chemo for 2 days. 30 tablet 1   lidocaine-prilocaine (EMLA) cream Apply to affected area once 30 g 3   LORazepam (ATIVAN) 0.5 MG tablet Take 1 tablet (0.5 mg total) by mouth 2 (two) times daily as needed for anxiety. 30 tablet 0   ondansetron (ZOFRAN) 8 MG tablet Take 1 tablet (8 mg total) by mouth every 8 (eight) hours as needed. 30 tablet 1   prochlorperazine (COMPAZINE) 10 MG tablet Take 1 tablet (10 mg total) by mouth every 6 (six) hours as needed (Nausea or vomiting). 90 tablet 1   No current facility-administered medications for this visit.   Facility-Administered Medications Ordered in Other Visits  Medication Dose Route Frequency Provider Last Rate Last Admin   dexamethasone (DECADRON) 10 mg in sodium chloride 0.9 % 50 mL IVPB  10 mg Intravenous Once Alvy Bimler, Cheetara Hoge, MD 204 mL/hr at 12/14/21 1235 10 mg at 12/14/21 1235   DOCEtaxel (TAXOTERE) 130 mg in sodium chloride 0.9 % 250 mL chemo infusion  80 mg/m2 (Treatment Plan Recorded) Intravenous Once [name], MD       gemcitabine (GEMZAR) 1,482 mg in sodium chloride 0.9 % 250 mL chemo infusion  900 mg/m2 (Treatment Plan Recorded) Intravenous Once [name], MD        SUMMARY OF ONCOLOGIC HISTORY: Oncology History  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 9.5 x 7.6 x 9.0 cm complex, partially necrotic, mass involving the lower uterine segment/ cervix. No obvious direct extension into the parametrium.  2. 9 mm left pelvic sidewall lymph node is partially necrotic and worrisome for metastatic adenopathy.  3. No findings for abdominal omental or peritoneal surface disease or adenopathy.  4. Tiny low-attenuation lesion in the pancreatic head, likely benign cyst but attention on follow-up scans is suggested.  5. 2.9 cm fundal fibroid.    06/19/2021 Pathology Results   FINAL MICROSCOPIC DIAGNOSIS:   A. UTERINE, CERVICAL MASS, BIOPSY:  - Spindle cell malignancy.  - See comment.    COMMENT:  The biopsies consist of endocervical mucosa with stromal edema and one biopsy fragment has a microscopic focus with atypical spindle cells consistent with poorly differentiated malignancy.  The differential  includes a spindle cell malignancy such as sarcomatoid carcinoma and leiomyosarcoma.  Mullerian adenosarcoma is also a consideration but considered less likely   06/23/2021 Imaging   MR pelvis  10 cm uterine mass with central necrosis, which is centered in the cervix and lower uterine segment. Right parametrial involvement is seen as well as suspected invasion of the distal rectum. Differential diagnosis includes cervical carcinoma and uterine leiomyosarcoma.   Mild bilateral iliac lymphadenopathy, highly suspicious for metastatic disease.   2.9 cm subserosal fibroid in the posterior fundus.   Normal appearance of both ovaries.     06/29/2021 PET scan   1. Hypermetabolic necrotic cervical/uterine mass with bilateral external iliac hypermetabolic lymph nodes. No evidence of distant metastatic disease. 2. 1.5 cm low-attenuation left thyroid nodule. Recommend thyroid ultrasound. (Ref: J Am Coll Radiol. 2015 Feb;12(2): 143-50).   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, pleomorphic, and  myxoid areas and associated necrosis (~20%) - Tumor based in cervix and also involves lower uterine segment - Cervicovaginal margin involved by focal invasive leiomyosarcoma (3:00-5:00, A10) as well as tumor in lymphovascular spaces - Leiomyosarcoma involves right and left parametrial tissue and extends to parametrial margins - Extensive lymphovascular space invasion present, including in uterus and parametria - See synoptic report and comment   Other findings: - Leiomyomata with hyalinization, size up to 3.0  cm - Ovaries and fallopian tubes with no parenchymal involvement by leiomyosarcoma identified, although adnexal lymphovascular space invasion is present   B: Lymph nodes, right pelvic, lymphadenectomy - One of four lymph nodes positive for metastatic leiomyosarcoma (1/4), with extracapsular extension present   C: Lymph nodes, left pelvic, lymphadenectomy - One of four lymph nodes positive for metastatic leiomyosarcoma (1/4), with extracapsular extension present  Immunohistochemical stains are performed on block A11, and demonstrate that the tumor is positive for desmin and CD10, with SMA staining the majority of the spindle cell component but largely negative in the epithelioid / pleomorphic component. OSCAR, pancytokeratin AE1/AE3, HMB45, and PR appear negative in the tumor. ER shows patchy weak staining and myogenin stains rare cells. Block A23 also shows positive desmin and negative OSCAR pancytokeratin. Overall, the findings are most consistent with leiomyosarcoma, with extensive areas that are myxoid, epithelioid, and pleomorphic as well as more typical spindle cell areas within the overall high grade tumor (grade 3 / 3). The tumor is staged as pT2b (involves other pelvic tissues) given the parametrial involvement and pN1 for FIGO stage IIIC.    07/17/2021 Surgery   Date of Surgery: 07/17/21  Preoperative Diagnosis: High Grade Uterine Sarcoma  Postoperative Diagnosis: Same  Procedure(s): Bilateral - RADICAL ABDOMINAL HYSTER, W/BIL TOTAL PELVIC LYMPHADENECTOMY & PARA-AORTIC LYMPH NODE BX W/WO REM TUBE/OVAR VAGINAL HYSTERECTOMY, FOR UTERUS 250 G OR LESS; WITH REPAIR OF ENTEROCELE COLECTOMY, PARTIAL; WITH COLOPROCTOSTOMY (LOW PELVIC ANASTOMOSIS) WITH COLOSTOMY CYSTOURETHROSCOPY, WITH INSERTION OF INDWELLING URETERAL STENT (EG, GIBBONS OR DOUBLE-J TYPE) - Cystourethroscopy - Bilateral ureteral stent placement - Foley catheter placement  Performing Service: Gynecology Oncology Surgeon(s)  and Role: Panel 1: * Lafonda Mosses, MD - Primary * Bernadene Bell, MD - Resident - Assisting * Devonne Doughty, MD - Resident - Assisting Panel 2: * Franchot Erichsen, MD - Primary  Drains:  - Left 6Fr open-ended ureteral access catheter (green) - Right 5Fr open-ended ureteral access catheter (white) - 16Fr foley catheter to drainage  * No implants in log *  Indications: 56 y.o. female with high grade uterine sarcoma. Urology was consulted pre-operatively for placement of bilateral ureteral stents. Risks, benefits, and alternatives of the above procedure were discussed and informed consent was signed.  OperativeFindings:  - Grossly distorted architecture of urinary bladder likely 2/2 pelvic mass with anterolaterally positioned UOs - Successful placement of bilateral open-ended ureteral catheters under direct visualization - Foley catheter placed at case conclusion  Description: The patient was correctly identified in the preop holding area where written informed consent as well potential risk and complication reviewed. She agreed. The patient was brought to the operative suite where a preinduction timeout was performed. Once correct information was verified, general anesthesia was induced. The patient was then gently placed into dorsal lithotomy position with SCDs in place for VTE prophylaxis. They were prepped and draped in the usual sterile fashion and given appropriate preoperative antibiotics. A second timeout was then performed.   We inserted a 60F rigid cystoscope per urethra with copious lubrication and  normal saline irrigation running. We performed cystourethroscopy, which revealed the above findings.  We turned our attention to the left ureteral orifice and canulated it with a sensor wire, using assistance of a 6Fr open-ended catheter. The wire was advanced into the renal pelvis without difficulty under visual guidance. We then advanced the stent over our wire into the  renal pelvis under direct visualization and feel without complication. The wire was subsequently removed.   We then turned our attention to the right ureteral orifice and canulated it with a sensor wire, using assistance of a 5Fr open-ended catheter. The wire was advanced into the renal pelvis without difficulty under visual guidance. We then advanced the stent over our wire into the renal pelvis under direct visualization and feel without complication. The wire was subsequently removed.   A 16Fr straight catheter was placed, with return of urine indicating appropriate position within the bladder. The balloon was inflated with 10cc sterile water. The stents were secured to the Foley using 0-silk ties, being careful not to occlude the stents or Foley.   The patient was awoken from general anesthesia having tolerated the procedure well and taken to the PACU for routine post-operative recovery.  Post-Op Plan:  - Foley and stents per primary team    07/17/2021 Surgery   Date of Surgery: 07/17/2021  Pre-op Diagnosis: Uterine spindle cell malignancy  Post-op Diagnosis: Same  Procedure(s): Panel 1 RADICAL ABDOMINAL HYSTER, with bilateral S&O, vagineconty upper, bilateral pelvic lyphadenectomy, bilateral ureterolysis,: 33295 (CPT) Panel 2 CYSTOURETHROSCOPY, WITH INSERTION OF INDWELLING URETERAL STENT (EG, GIBBONS OR DOUBLE-J TYPE): 18841 (CPT) Note: Revisions to procedures should be made in chart - see Procedures activity.  Performing Service: Gynecology Oncology Surgeon(s) and Role: Panel 1: * Lafonda Mosses, MD - Primary * Bernadene Bell, MD - Resident - Assisting * Devonne Doughty, MD - Resident - Assisting Panel 2: * Franchot Erichsen, MD - Primary  Findings: On bimanual exam, 10cm necrotic mass filling upper vagina, unable to discretely palpate the cervix. On rectovaginal exam, rectal involvement not identified. Intraoperatively, normal upper abdominal survey including  normal liver, diaphragm, stomach, omentum and bowel. Small uterus with 10cm mass expanding the cervix. Palpably enlarged bilateral pelvic lymph nodes adherent to the external iliac veins and obturator nerves, removed. No palpable para-aortic lymphadenopathy. No rectal involvement of uterine mass.   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 Interval development of left lobe pulmonary nodules, measuring up to 7 mm and highly for metastatic disease. 2. Interval development of small to upper normal lymph nodes in the pelvis, concerning for metastatic disease. 3. Postoperative seroma left pelvic sidewall. 4. Tiny cluster of tree-in-bud opacity in the peripheral right lower lobe is new and compatible with sequelae of atypical infection.   08/13/2021 Procedure  Procedure: Placement of a right IJ approach single lumen PowerPort.  Tip is positioned at the superior cavoatrial junction and catheter is ready for immediate use.  Complications: No immediate   08/14/2021 Echocardiogram    1. Left ventricular ejection fraction, by estimation, is 60 to 65%. The left ventricle has normal function. The left ventricle has no regional wall motion abnormalities. Left ventricular diastolic parameters  were normal. The average left ventricular global longitudinal strain is -17.4 %. The global longitudinal strain is normal.  2. Right ventricular systolic function is normal. The right ventricular size is normal.  3. The mitral valve is normal in structure. No evidence of mitral valve regurgitation. No evidence of mitral stenosis.  4. The aortic valve is tricuspid. Aortic valve regurgitation is not visualized. No aortic stenosis is present.  5. The inferior vena cava is normal in size with greater than 50% respiratory variability, suggesting right atrial pressure of 3 mmHg.     08/17/2021 Imaging   Multiple new and enlarging pulmonary nodules scattered throughout the lungs bilaterally, highly concerning for progressive metastatic disease to the lungs   08/18/2021 - 11/10/2021 Chemotherapy   Patient is on Treatment Plan : UTERINE LEIOMYOSARCOMA Doxorubicin q21d x 6 Cycles     11/09/2021 Imaging   IMPRESSION: 1. Multiple small bilateral pulmonary nodules, some of which are slightly increased in size. Other nodules unchanged. 2. Interval decrease in size of left pelvic sidewall lymph nodes. 3. Unchanged size of perirectal lymph nodes or soft tissue nodules. These however demonstrate new internal hypodensity, suggesting treatment response and internal necrosis. 4. Unchanged left iliac lymph node or peritoneal nodule. 5. Findings are consistent with mixed response to treatment. No evidence of new metastatic disease in the chest, abdomen, or pelvis. 6. Wall thickening and mucosal hyperenhancement of the bladder, consistent with nonspecific infectious or inflammatory cystitis. Correlate with urinalysis. 7. Status post hysterectomy and oophorectomy. Interval resolution of a previously noted left pelvic hematoma or seroma. 8. Trace, nonspecific free fluid in the low pelvis.   11/30/2021 Echocardiogram    1. Left ventricular ejection fraction, by estimation, is 40 to 45%. Left ventricular ejection fraction  by 3D volume is 41 %. The left ventricle has mildly decreased function. The left ventricle has no regional wall motion abnormalities. Left ventricular  diastolic parameters are consistent with Grade I diastolic dysfunction (impaired relaxation).  2. Right ventricular systolic function is moderately reduced. The right ventricular size is normal.  3. The mitral valve is grossly normal. No evidence of mitral valve regurgitation.  4. The aortic valve is normal in structure. Aortic valve regurgitation is not visualized. No aortic stenosis is present.     12/07/2021 -  Chemotherapy   Patient is on Treatment Plan : UTERINE UNDIFFERENTIATED / LEIOMYOSARCOMA Gemcitabine D1,8 + Docetaxel D8 (900/100) q21d     Pulmonary metastases ([place])  08/11/2021 Initial Diagnosis   Pulmonary metastases ([place])   08/18/2021 - 11/10/2021 Chemotherapy   Patient is on Treatment Plan : UTERINE LEIOMYOSARCOMA Doxorubicin q21d x 6 Cycles     11/09/2021 Imaging   IMPRESSION: 1. Multiple small bilateral pulmonary nodules, some of which are slightly increased in size. Other nodules unchanged. 2. Interval decrease in size of left pelvic sidewall lymph nodes. 3. Unchanged size of perirectal lymph nodes or soft tissue nodules. These however demonstrate new internal hypodensity, suggesting treatment response and internal necrosis. 4. Unchanged left iliac lymph node or peritoneal nodule. 5. Findings are consistent with mixed response to treatment. No evidence of new metastatic  disease in the chest, abdomen, or pelvis. 6. Wall thickening and mucosal hyperenhancement of the bladder, consistent with nonspecific infectious or inflammatory cystitis. Correlate with urinalysis. 7. Status post hysterectomy and oophorectomy. Interval resolution of a previously noted left pelvic hematoma or seroma. 8. Trace, nonspecific free fluid in the low pelvis.   12/07/2021 -  Chemotherapy   Patient is on Treatment Plan : UTERINE UNDIFFERENTIATED /  LEIOMYOSARCOMA Gemcitabine D1,8 + Docetaxel D8 (900/100) q21d       PHYSICAL EXAMINATION: ECOG PERFORMANCE STATUS: 1 - Symptomatic but completely ambulatory  Vitals:   12/14/21 1149  BP: (!) 129/91  Pulse: 94  Resp: 18  Temp: (!) 97.4 F (36.3 C)  SpO2: 100%   Filed Weights   12/14/21 1149  Weight: 123 lb 3.2 oz (55.9 kg)    GENERAL:alert, no distress and comfortable SKIN: Noted small skin rash around her neck and near her groin NEURO: alert & oriented x 3 with fluent speech, no focal motor/sensory deficits  LABORATORY DATA:  I have reviewed the data as listed    Component Value Date/Time   NA 137 12/14/2021 1127   K 3.9 12/14/2021 1127   CL 104 12/14/2021 1127   CO2 23 12/14/2021 1127   GLUCOSE 110 (H) 12/14/2021 1127   BUN 16 12/14/2021 1127   CREATININE 0.79 12/14/2021 1127   CALCIUM 9.9 12/14/2021 1127   PROT 7.6 12/14/2021 1127   ALBUMIN 4.4 12/14/2021 1127   AST 73 (H) 12/14/2021 1127   ALT 173 (H) 12/14/2021 1127   ALKPHOS 113 12/14/2021 1127   BILITOT 0.3 12/14/2021 1127   GFRNONAA >60 12/14/2021 1127    No results found for: SPEP, UPEP  Lab Results  Component Value Date   WBC 4.4 12/14/2021   NEUTROABS 3.4 12/14/2021   HGB 10.7 (L) 12/14/2021   HCT 31.3 (L) 12/14/2021   MCV 89.4 12/14/2021   PLT 158 12/14/2021      Chemistry      Component Value Date/Time   NA 137 12/14/2021 1127   K 3.9 12/14/2021 1127   CL 104 12/14/2021 1127   CO2 23 12/14/2021 1127   BUN 16 12/14/2021 1127   CREATININE 0.79 12/14/2021 1127      Component Value Date/Time   CALCIUM 9.9 12/14/2021 1127   ALKPHOS 113 12/14/2021 1127   AST 73 (H) 12/14/2021 1127   ALT 173 (H) 12/14/2021 1127   BILITOT 0.3 12/14/2021 1127       RADIOGRAPHIC STUDIES: I have personally reviewed the radiological images as listed and agreed with the findings in the report. ECHOCARDIOGRAM COMPLETE  Result Date: 11/30/2021    ECHOCARDIOGRAM REPORT   Patient Name:   Tammie Gilmore  Date of Exam: 11/30/2021 Medical Rec #:  [id number]         Height:       68.0 in Accession #:    [id number]        Weight:       129.4 lb Date of Birth:  [birth year]        BSA:          1.698 m Patient Age:    56 years          BP:           134/83 mmHg Patient Gender: F                 HR:           90 bpm. Exam Location:  Inpatient Procedure: 2D Echo, 3D Echo and Strain Analysis Indications:    Chemotherapy  History:        Patient has prior history of Echocardiogram examinations, most                 recent 08/14/2021.  Sonographer:    Jefferey Pica Referring Phys: [id number] Mikaia Janvier Solvay  1. Left ventricular ejection fraction, by estimation, is 40 to 45%. Left ventricular ejection fraction by 3D volume is 41 %. The left ventricle has mildly decreased function. The left ventricle has no regional wall motion abnormalities. Left ventricular  diastolic parameters are consistent with Grade I diastolic dysfunction (impaired relaxation).  2. Right ventricular systolic function is moderately reduced. The right ventricular size is normal.  3. The mitral valve is grossly normal. No evidence of mitral valve regurgitation.  4. The aortic valve is normal in structure. Aortic valve regurgitation is not visualized. No aortic stenosis is present. FINDINGS  Left Ventricle: Left ventricular ejection fraction, by estimation, is 40 to 45%. Left ventricular ejection fraction by 3D volume is 41 %. The left ventricle has mildly decreased function. The left ventricle has no regional wall motion abnormalities. The  left ventricular internal cavity size was normal in size. There is no left ventricular hypertrophy. Left ventricular diastolic parameters are consistent with Grade I diastolic dysfunction (impaired relaxation). Right Ventricle: The right ventricular size is normal. Right vetricular wall thickness was not well visualized. Right ventricular systolic function is moderately reduced. Left Atrium: Left atrial size was  normal in size. Right Atrium: Right atrial size was normal in size. Pericardium: There is no evidence of pericardial effusion. Mitral Valve: The mitral valve is grossly normal. No evidence of mitral valve regurgitation. Tricuspid Valve: The tricuspid valve is normal in structure. Tricuspid valve regurgitation is not demonstrated. Aortic Valve: The aortic valve is normal in structure. Aortic valve regurgitation is not visualized. No aortic stenosis is present. Aortic valve peak gradient measures 5.9 mmHg. Pulmonic Valve: The pulmonic valve was normal in structure. Pulmonic valve regurgitation is not visualized. Aorta: The aortic root and ascending aorta are structurally normal, with no evidence of dilitation. IAS/Shunts: The atrial septum is grossly normal.  LEFT VENTRICLE PLAX 2D LVIDd:         3.90 cm         Diastology LVIDs:         3.35 cm         LV e' medial:    6.60 cm/s LV PW:         1.00 cm         LV E/e' medial:  9.2 LV IVS:        0.90 cm         LV e' lateral:   7.43 cm/s                                LV E/e' lateral: 8.2  LV Volumes (MOD)               2D LV vol d, MOD    56.5 ml       Longitudinal A4C:                           Strain LV vol s, MOD    39.0 ml       2D Strain GLS  -16.5 % A4C:  Avg: LV SV MOD A4C:   56.5 ml                                3D Volume EF                                LV 3D EF:    Left                                             ventricul                                             ar                                             ejection                                             fraction                                             by 3D                                             volume is                                             41 %.                                 3D Volume EF:                                3D EF:        41 %                                LV EDV:       97 ml                                LV ESV:       58 ml                                 LV SV:        39  ml RIGHT VENTRICLE            IVC RV S prime:     8.10 cm/s  IVC diam: 1.50 cm TAPSE (M-mode): 1.3 cm LEFT ATRIUM             Index        RIGHT ATRIUM          Index LA diam:        2.20 cm 1.30 cm/m   RA Area:     7.59 cm LA Vol (A2C):   19.5 ml 11.48 ml/m  RA Volume:   12.80 ml 7.54 ml/m LA Vol (A4C):   18.8 ml 11.07 ml/m LA Biplane Vol: 20.2 ml 11.89 ml/m  AORTIC VALVE              PULMONIC VALVE AV Vmax:      121.00 cm/s PV Vmax:       0.99 m/s AV Peak Grad: 5.9 mmHg    PV Peak grad:  3.9 mmHg LVOT Vmax:    89.40 cm/s LVOT Vmean:   57.700 cm/s LVOT VTI:     0.144 m  AORTA Ao Root diam: 2.90 cm Ao Asc diam:  3.20 cm MITRAL VALVE MV Area (PHT): 3.28 cm    SHUNTS MV Decel Time: 231 msec    Systemic VTI: 0.14 m MV E velocity: 60.60 cm/s MV A velocity: 77.10 cm/s MV E/A ratio:  0.79 Mertie Moores MD Electronically signed by Mertie Moores MD Signature Date/Time: 11/30/2021/11:35:10 AM    Final

## 2021-12-14 NOTE — Assessment & Plan Note (Signed)
Overall, she had developed several complications from treatment ?Her UTI symptom has resolved ?She have developed rare skin rash of unknown etiology ?She has elevated LFT likely due to recent treatment ?She has lost some weight ?We will proceed with treatment today with minor dose adjustment of Taxotere ?I reminded her to take dexamethasone for 2 days after treatment along with G-CSF support in 2 days ?I recommend minimum 3 cycles of treatment before repeating imaging study ?

## 2021-12-14 NOTE — Assessment & Plan Note (Signed)
She has mild progressive weight loss ?I will adjust the dose of her treatment accordingly ?

## 2021-12-14 NOTE — Assessment & Plan Note (Signed)
She has mild elevated liver enzymes ?Plan to reduce the dose of Taxotere a little bit ?We will proceed with treatment without delay ?

## 2021-12-14 NOTE — Assessment & Plan Note (Addendum)
She has recent recurrent UTI of different bacteria ?It raises a question whether she might have fistula ?I will order repeat urinalysis and urine culture today ?If she has recurrent UTI again, I will refer her to infectious disease for further evaluation ?

## 2021-12-14 NOTE — Assessment & Plan Note (Signed)
She has mild skin rash around her neck and in the groin region ?It is resolving ?It is not clear whether this is due to Bactrim allergy ?The distribution of the rash is not consistent with drug rash ?Observe only for now ?

## 2021-12-14 NOTE — Patient Instructions (Addendum)
[facility]   Discharge Instructions: Thank you for choosing [facility] to provide your oncology and hematology care.   If you have a lab appointment with the [facility], please go directly to the [facility] and check in at the registration area.   Wear comfortable clothing and clothing appropriate for easy access to any Portacath or PICC line.   We strive to give you quality time with your provider. You may need to reschedule your appointment if you arrive late (15 or more minutes).  Arriving late affects you and other patients whose appointments are after yours.  Also, if you miss three or more appointments without notifying the office, you may be dismissed from the clinic at the providers discretion.      For prescription refill requests, have your pharmacy contact our office and allow 72 hours for refills to be completed.    Today you received the following chemotherapy and/or immunotherapy agents: Gemzar & Taxotere   To help prevent nausea and vomiting after your treatment, we encourage you to take your nausea medication as directed.  BELOW ARE SYMPTOMS THAT SHOULD BE REPORTED IMMEDIATELY: *FEVER GREATER THAN 100.4 F (38 C) OR HIGHER *CHILLS OR SWEATING *NAUSEA AND VOMITING THAT IS NOT CONTROLLED WITH YOUR NAUSEA MEDICATION *UNUSUAL SHORTNESS OF BREATH *UNUSUAL BRUISING OR BLEEDING *URINARY PROBLEMS (pain or burning when urinating, or frequent urination) *BOWEL PROBLEMS (unusual diarrhea, constipation, pain near the anus) TENDERNESS IN MOUTH AND THROAT WITH OR WITHOUT PRESENCE OF ULCERS (sore throat, sores in mouth, or a toothache) UNUSUAL RASH, SWELLING OR PAIN  UNUSUAL VAGINAL DISCHARGE OR ITCHING   Items with * indicate a potential emergency and should be followed up as soon as possible or go to the Emergency Department if any problems should occur.  Please show the CHEMOTHERAPY ALERT CARD or IMMUNOTHERAPY ALERT CARD at  check-in to the Emergency Department and triage nurse.  Should you have questions after your visit or need to cancel or reschedule your appointment, please contact [facility]  Dept: [phone]  and follow the prompts.  Office hours are 8:00 a.m. to 4:30 p.m. Monday - Friday. Please note that voicemails left after 4:00 p.m. may not be returned until the following business day.  We are closed weekends and major holidays. You have access to a nurse at all times for urgent questions. Please call the main number to the clinic Dept: [phone] and follow the prompts.   For any non-urgent questions, you may also contact your provider using MyChart. We now offer e-Visits for anyone 80 and older to request care online for non-urgent symptoms. For details visit [email or web address].   Also download the MyChart app! Go to the app store, search "MyChart", open the app, select [facility], and log in with your MyChart username and password.  Due to Covid, a mask is required upon entering the hospital/clinic. If you do not have a mask, one will be given to you upon arrival. For doctor visits, patients may have 1 support person aged 45 or older with them. For treatment visits, patients cannot have anyone with them due to current Covid guidelines and our immunocompromised population.    Docetaxel injection What is this medication? DOCETAXEL (doe se TAX el) is a chemotherapy drug. It targets fast dividing cells, like cancer cells, and causes these cells to die. This medicine is used to treat many types of cancers like breast cancer, certain stomach cancers, head and neck  cancer, lung cancer, and prostate cancer. This medicine may be used for other purposes; ask your health care provider or pharmacist if you have questions. COMMON BRAND NAME(S): Docefrez, Taxotere What should I tell my care team before I take this medication? They need to know if you have any of these  conditions: infection (especially a virus infection such as chickenpox, cold sores, or herpes) liver disease low blood counts, like low white cell, platelet, or red cell counts an unusual or allergic reaction to docetaxel, polysorbate 80, other chemotherapy agents, other medicines, foods, dyes, or preservatives pregnant or trying to get pregnant breast-feeding How should I use this medication? This drug is given as an infusion into a vein. It is administered in a hospital or clinic by a specially trained health care professional. Talk to your pediatrician regarding the use of this medicine in children. Special care may be needed. Overdosage: If you think you have taken too much of this medicine contact a poison control center or emergency room at once. NOTE: This medicine is only for you. Do not share this medicine with others. What if I miss a dose? It is important not to miss your dose. Call your doctor or health care professional if you are unable to keep an appointment. What may interact with this medication? Do not take this medicine with any of the following medications: live virus vaccines This medicine may also interact with the following medications: aprepitant certain antibiotics like erythromycin or clarithromycin certain antivirals for HIV or hepatitis certain medicines for fungal infections like fluconazole, itraconazole, ketoconazole, posaconazole, or voriconazole cimetidine ciprofloxacin conivaptan cyclosporine dronedarone fluvoxamine grapefruit juice imatinib verapamil This list may not describe all possible interactions. Give your health care provider a list of all the medicines, herbs, non-prescription drugs, or dietary supplements you use. Also tell them if you smoke, drink alcohol, or use illegal drugs. Some items may interact with your medicine. What should I watch for while using this medication? Your condition will be monitored carefully while you are receiving  this medicine. You will need important blood work done while you are taking this medicine. Call your doctor or health care professional for advice if you get a fever, chills or sore throat, or other symptoms of a cold or flu. Do not treat yourself. This drug decreases your body's ability to fight infections. Try to avoid being around people who are sick. Some products may contain alcohol. Ask your health care professional if this medicine contains alcohol. Be sure to tell all health care professionals you are taking this medicine. Certain medicines, like metronidazole and disulfiram, can cause an unpleasant reaction when taken with alcohol. The reaction includes flushing, headache, nausea, vomiting, sweating, and increased thirst. The reaction can last from 30 minutes to several hours. You may get drowsy or dizzy. Do not drive, use machinery, or do anything that needs mental alertness until you know how this medicine affects you. Do not stand or sit up quickly, especially if you are an older patient. This reduces the risk of dizzy or fainting spells. Alcohol may interfere with the effect of this medicine. Talk to your health care professional about your risk of cancer. You may be more at risk for certain types of cancer if you take this medicine. Do not become pregnant while taking this medicine or for 6 months after stopping it. Women should inform their doctor if they wish to become pregnant or think they might be pregnant. There is a potential for serious side effects  to an unborn child. Talk to your health care professional or pharmacist for more information. Do not breast-feed an infant while taking this medicine or for 1 week after stopping it. Males who get this medicine must use a condom during sex with females who can get pregnant. If you get a woman pregnant, the baby could have birth defects. The baby could die before they are born. You will need to continue wearing a condom for 3 months after  stopping the medicine. Tell your health care provider right away if your partner becomes pregnant while you are taking this medicine. This may interfere with the ability to father a child. You should talk to your doctor or health care professional if you are concerned about your fertility. What side effects may I notice from receiving this medication? Side effects that you should report to your doctor or health care professional as soon as possible: allergic reactions like skin rash, itching or hives, swelling of the face, lips, or tongue blurred vision breathing problems changes in vision low blood counts - This drug may decrease the number of white blood cells, red blood cells and platelets. You may be at increased risk for infections and bleeding. nausea and vomiting pain, redness or irritation at site where injected pain, tingling, numbness in the hands or feet redness, blistering, peeling, or loosening of the skin, including inside the mouth signs of decreased platelets or bleeding - bruising, pinpoint red spots on the skin, black, tarry stools, nosebleeds signs of decreased red blood cells - unusually weak or tired, fainting spells, lightheadedness signs of infection - fever or chills, cough, sore throat, pain or difficulty passing urine swelling of the ankle, feet, hands Side effects that usually do not require medical attention (report to your doctor or health care professional if they continue or are bothersome): constipation diarrhea fingernail or toenail changes hair loss loss of appetite mouth sores muscle pain This list may not describe all possible side effects. Call your doctor for medical advice about side effects. You may report side effects to FDA at 1-800-FDA-1088. Where should I keep my medication? This drug is given in a hospital or clinic and will not be stored at home. NOTE: This sheet is a summary. It may not cover all possible information. If you have questions  about this medicine, talk to your doctor, pharmacist, or health care provider.  2022 Elsevier/Gold Standard (2021-06-16 00:00:00)

## 2021-12-14 NOTE — Progress Notes (Signed)
Ok to treat today per Dr. Alvy Bimler with elevated ALT 173 U/L she is going to reduce the taxotere dose for today.  ?

## 2021-12-15 ENCOUNTER — Telehealth: Payer: Self-pay | Admitting: *Deleted

## 2021-12-15 LAB — URINE CULTURE: Culture: NO GROWTH

## 2021-12-15 NOTE — Telephone Encounter (Signed)
-----   Message from Severiano Gilbert, RN sent at 12/14/2021  3:41 PM EST ----- ?Regarding: First Time Taxotere (Plus Gemzar - Second Time) - Dr. Alvy Bimler Patient ?First Time Taxotere (Plus Gemzar - Second Time) - Dr. Alvy Bimler Patient ?Pt has been dealing with post nasal drip symptoms, mild nausea, and UTI. Treatment tolerated well  ? ?

## 2021-12-15 NOTE — Telephone Encounter (Signed)
Called pt to see how she did with her treatment yesterday & she reports doing well.  She denies any concerns except wanting to know results of culture done yest.  She reports knowing how to reach [place] if needed.   ?

## 2021-12-16 ENCOUNTER — Inpatient Hospital Stay: Payer: BC Managed Care – PPO

## 2021-12-16 ENCOUNTER — Telehealth: Payer: Self-pay | Admitting: Oncology

## 2021-12-16 ENCOUNTER — Other Ambulatory Visit: Payer: Self-pay

## 2021-12-16 ENCOUNTER — Ambulatory Visit: Payer: BC Managed Care – PPO

## 2021-12-16 ENCOUNTER — Other Ambulatory Visit: Payer: Self-pay | Admitting: Hematology and Oncology

## 2021-12-16 VITALS — BP 122/82 | HR 92 | Temp 99.0°F | Resp 16

## 2021-12-16 DIAGNOSIS — C7802 Secondary malignant neoplasm of left lung: Secondary | ICD-10-CM

## 2021-12-16 DIAGNOSIS — Z5111 Encounter for antineoplastic chemotherapy: Secondary | ICD-10-CM | POA: Diagnosis not present

## 2021-12-16 DIAGNOSIS — C7801 Secondary malignant neoplasm of right lung: Secondary | ICD-10-CM

## 2021-12-16 DIAGNOSIS — C55 Malignant neoplasm of uterus, part unspecified: Secondary | ICD-10-CM

## 2021-12-16 MED ORDER — SULFAMETHOXAZOLE-TRIMETHOPRIM 800-160 MG PO TABS: 1.0000 | ORAL_TABLET | Freq: Two times a day (BID) | ORAL | 0 refills | Status: DC

## 2021-12-16 MED ORDER — PEGFILGRASTIM-CBQV 6 MG/0.6ML ~~LOC~~ SOSY
6.0000 mg | PREFILLED_SYRINGE | Freq: Once | SUBCUTANEOUS | Status: AC
  Administered 2021-12-16: 6 mg via SUBCUTANEOUS
  Filled 2021-12-16: qty 0.6

## 2021-12-16 MED ORDER — OXYCODONE HCL 5 MG PO TABS
5.0000 mg | ORAL_TABLET | ORAL | 0 refills | Status: DC | PRN
Start: 2021-12-16 — End: 2022-06-15

## 2021-12-16 NOTE — Telephone Encounter (Addendum)
Called Tammie Gilmore and advised her of message from Dr. Alvy Bimler.  She asked if she should take Bactrim again because of the rash she had.  Checked with Dr. Alvy Bimler and advised her that Dr. Alvy Bimler wasn't sure if the rash was from Bactrim.  She can try taking it again and if the rash occurs, she can stop taking it.  Also advised that we can refer her to allergist if she would like for testing.  She said she will try the Bactrim again if her pain worsens over the weekend and will stop it if the rash occurs again. ?

## 2021-12-16 NOTE — Telephone Encounter (Signed)
Her bladder is probably still inflamed from recent UTI ?I am not sure if prescribing more antibiotics would be helpful; prolonged course of Bactrim can increase risks of low blood count. If she still want antibiotics, we can refill it ?Would she be open to taking short term pain medications? ? ? ?

## 2021-12-16 NOTE — Telephone Encounter (Signed)
Called Tammie Gilmore and advised her of negative urine culture results. She verbalized understanding and said she has started to have twinges of pain before she starts urinating and afterwards.  She said it wasn't there while she was taking Bactrim, which she finished 2 days ago.  She is asking if Dr. Alvy Bimler recommends anything for this like another antibiotic.  She is worried about having the pain again over the weekend.   ?

## 2021-12-16 NOTE — Telephone Encounter (Signed)
Tammie Gilmore of message below from Dr. Alvy Bimler.  She is wondering if she can have the antibiotic refilled and sent to [facility] in case the pain gets worse over the weekend.  She would like to have it on hand if that is possible.  She thinks she may have some pain medication at home but was not sure which one.  Tylenol has not relieved her pain.  She is wondering if cranberry juice or AZO would help. ?

## 2021-12-16 NOTE — Telephone Encounter (Signed)
I sent bactrim and oxycodone to [facility] ?Cranberry juice or Azo will not help ?

## 2021-12-16 NOTE — Patient Instructions (Signed)

## 2021-12-17 ENCOUNTER — Ambulatory Visit (HOSPITAL_COMMUNITY)
Admission: RE | Admit: 2021-12-17 | Discharge: 2021-12-17 | Disposition: A | Discharge status: Home / Self Care | Payer: BC Managed Care – PPO | Source: Ambulatory Visit | Attending: Internal Medicine | Admitting: Internal Medicine

## 2021-12-17 VITALS — BP 120/80 | HR 100 | Wt 124.8 lb

## 2021-12-17 DIAGNOSIS — I427 Cardiomyopathy due to drug and external agent: Secondary | ICD-10-CM | POA: Insufficient documentation

## 2021-12-17 DIAGNOSIS — C7800 Secondary malignant neoplasm of unspecified lung: Secondary | ICD-10-CM | POA: Insufficient documentation

## 2021-12-17 DIAGNOSIS — T451X5A Adverse effect of antineoplastic and immunosuppressive drugs, initial encounter: Secondary | ICD-10-CM

## 2021-12-17 DIAGNOSIS — C7801 Secondary malignant neoplasm of right lung: Secondary | ICD-10-CM

## 2021-12-17 DIAGNOSIS — C55 Malignant neoplasm of uterus, part unspecified: Secondary | ICD-10-CM | POA: Insufficient documentation

## 2021-12-17 DIAGNOSIS — C7802 Secondary malignant neoplasm of left lung: Secondary | ICD-10-CM | POA: Diagnosis not present

## 2021-12-17 MED ORDER — LOSARTAN POTASSIUM 25 MG PO TABS: 25.0000 mg | ORAL_TABLET | Freq: Every evening | ORAL | 11 refills | Status: DC

## 2021-12-17 MED ORDER — METOPROLOL SUCCINATE ER 25 MG PO TB24: 25.0000 mg | ORAL_TABLET | Freq: Every evening | ORAL | 11 refills | Status: DC

## 2021-12-17 NOTE — Progress Notes (Signed)
? ?CARDIO-ONCOLOGY CLINIC CONSULT NOTE ? ?Referring Physician: ?Primary Care: ?Primary Cardiologist: ? 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 Elson Areas for enrollment into the Cardio-Oncology program. ? ?Diagnosed with uterine CA in 9/22. PET scan with hypermetabolic necrotic cervical/uterine mass with bilateral external iliac hypermetabolic lymph nodes. No evidence of distant metastatic disease.  ? ?Underwent resection 10/22 ? 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 ?Had chemotherapy Doxorubicin q21d x  completed 5 of 6 Cycles ? ?Switched to Gemcitabine D1,8 + Docetaxel D8 (900/100) q21d  baed on echo  ? ?Echo 11/22 EF 60-65% ?Echo 2/23 EF 40-45% ( I felt closer to 50-55%) ? ?No known h/o heart disease. Was perfectly healthy until her cancer diagnosis Feels weak  ? ? ? ?Review of Systems: [y] = yes, '[ ]'$  = no  ? ?General: Weight gain '[ ]'$ ; Weight loss [ y]; Anorexia '[ ]'$ ; Fatigue [ y]; Fever '[ ]'$ ; Chills '[ ]'$ ; Weakness Blue.Reese ]  ?Cardiac: Chest pain/pressure '[ ]'$ ; Resting SOB '[ ]'$ ; Exertional SOB '[ ]'$ ; Orthopnea '[ ]'$ ; Pedal Edema '[ ]'$ ; Palpitations '[ ]'$ ; Syncope '[ ]'$ ; Presyncope '[ ]'$ ; Paroxysmal nocturnal dyspnea'[ ]'$   ?Pulmonary: Cough '[ ]'$ ; Wheezing'[ ]'$ ; Hemoptysis'[ ]'$ ; Sputum '[ ]'$ ; Snoring '[ ]'$   ?GI: Vomiting'[ ]'$ ; Dysphagia'[ ]'$ ; Melena'[ ]'$ ; Hematochezia '[ ]'$ ; Heartburn'[ ]'$ ; Abdominal pain '[ ]'$ ; Constipation '[ ]'$ ; Diarrhea '[ ]'$ ; BRBPR '[ ]'$   ?GU: Hematuria'[ ]'$ ; Dysuria '[ ]'$ ; Nocturia'[ ]'$   ?Vascular: Pain in legs with walking '[ ]'$ ; Pain in feet with lying flat '[ ]'$ ; Non-healing sores '[ ]'$ ; Stroke '[ ]'$ ; TIA '[ ]'$ ; Slurred speech '[ ]'$ ;  ?Neuro: Headaches'[ ]'$ ; Vertigo'[ ]'$ ; Seizures'[ ]'$ ; Paresthesias'[ ]'$ ;Blurred vision '[ ]'$ ; Diplopia '[ ]'$ ; Vision changes '[ ]'$   ?Ortho/Skin: Arthritis [ y]; Joint pain [ y]; Muscle pain '[ ]'$ ; Joint swelling '[ ]'$ ; Back Pain '[ ]'$ ; Rash '[ ]'$   ?Psych: Depression[y ]; Anxiety'[ ]'$   ?Heme: Bleeding problems '[ ]'$ ; Clotting disorders '[ ]'$ ; Anemia '[ ]'$   ?Endocrine: Diabetes '[ ]'$ ; Thyroid dysfunction[  ] ? ? ?Past Medical History:  ?Diagnosis Date  ? Hypoglycemia   ? occasional episodes of hypoglycemia  ? IBS (irritable bowel syndrome)   ? ? ?Current Outpatient Medications  ?Medication Sig Dispense Refill  ? acetaminophen (TYLENOL) 500 MG tablet Take 1,000 mg by mouth every 6 (six) hours as needed for moderate pain or headache.    ? bisacodyl 5 MG EC tablet Take 1 tablet (5 mg total) by mouth daily as needed for moderate constipation. 30 tablet 3  ? dexamethasone (DECADRON) 4 MG tablet Take 2 tablets (8 mg total) by mouth daily. Start the day before Taxotere. Then daily after chemo for 2 days. 30 tablet 1  ? lidocaine-prilocaine (EMLA) cream Apply to affected area once 30 g 3  ? LORazepam (ATIVAN) 0.5 MG tablet Take 1 tablet (0.5 mg total) by mouth 2 (two) times daily as needed for anxiety. 30 tablet 0  ? ondansetron (ZOFRAN) 8 MG tablet Take 1 tablet (8 mg total) by mouth every 8 (eight) hours as needed. 30 tablet 1  ? oxyCODONE (OXY IR/ROXICODONE) 5 MG immediate release tablet Take 1 tablet (5 mg total) by mouth every 4 (four) hours as needed for severe pain. 30 tablet 0  ? prochlorperazine (COMPAZINE) 10 MG tablet Take 1 tablet (10 mg total) by mouth every 6 (six) hours as needed (Nausea or vomiting). 90 tablet 1  ? sulfamethoxazole-trimethoprim (BACTRIM DS) 800-160  MG tablet Take 1 tablet by mouth 2 (two) times daily. (Patient taking differently: Take 1 tablet by mouth as needed.) 14 tablet 0  ? ?No current facility-administered medications for this encounter.  ? ? ?Allergies  ?Allergen Reactions  ? Doxycycline   ?  Dizziness, NAUSEA  ? ? ?  ?Social History  ? ?Socioeconomic History  ? Marital status: Married  ?  Spouse name: Not on file  ? Number of children: 3  ? Years of education: Not on file  ? Highest education level: Not on file  ?Occupational History  ? Occupation: accountant  ?Tobacco Use  ? Smoking status: Never  ? Smokeless tobacco: Never  ?Vaping Use  ? Vaping Use: Never used  ?Substance and Sexual  Activity  ? Alcohol use: Never  ? Drug use: Never  ? Sexual activity: Not Currently  ?Other Topics Concern  ? Not on file  ?Social History Narrative  ? Not on file  ? ?Social Determinants of Health  ? ?Financial Resource Strain: Not on file  ?Food Insecurity: Not on file  ?Transportation Needs: Not on file  ?Physical Activity: Not on file  ?Stress: Not on file  ?Social Connections: Not on file  ?Intimate Partner Violence: Not on file  ? ? ?  ?Family History  ?Problem Relation Age of Onset  ? Heart attack Mother   ? Heart disease Mother   ? Endometriosis Mother   ? Heart disease Father   ? Heart attack Father   ? Cancer - Colon Neg Hx   ? Breast cancer Neg Hx   ? Cancer Neg Hx   ? Ovarian cancer Neg Hx   ? Uterine cancer Neg Hx   ? Pancreatic cancer Neg Hx   ? Pancreatic disease Neg Hx   ? Prostate cancer Neg Hx   ? ? ?Vitals:  ? 12/17/21 1541  ?BP: 120/80  ?Pulse: 100  ?SpO2: 98%  ?Weight: 56.6 kg (124 lb 12.8 oz)  ? ? ?PHYSICAL EXAM: ?General:  Weak appearing. No respiratory difficulty ?HEENT: normal +alopecia  ?Neck: supple. no JVD. Carotids 2+ bilat; no bruits. No lymphadenopathy or thryomegaly appreciated. ?Cor: PMI nondisplaced. Regular rate & rhythm. No rubs, gallops or murmurs. + left port ?Lungs: clear ?Abdomen: soft, nontender, nondistended. No hepatosplenomegaly. No bruits or masses. Good bowel sounds. ?Extremities: no cyanosis, clubbing, rash, edema ?Neuro: alert & oriented x 3, cranial nerves grossly intact. moves all 4 extremities w/o difficulty. Affect pleasant. ? ? ?ASSESSMENT & PLAN: ? ?1. Chemo-induced cardiomyopathy  ?-s/p Doxorubicin q21d x  completed 5 of 6 Cycles ?- Echo 11/22 EF 60-65% ?- Echo 2/23 EF 40-45% ( I felt closer to 50-55%) ?- I suspect she has very mild chemo (doxorubicin) toxicity. I explained the etiology and pathophysiology of this, Reiterated that it is very mild and EF is still in normal range.  ?- Will start Torprol 25 and Losartan 25 qhs to help stabilize/improve EF ?- I  think it is very unlikely that she will develop symptomatic HF ?- Reassured her and her husband.  ? Will repeat echo in about 2 months, to reassess ? ?2. Uterine CA, stage IV ?- per Dr. Alvy Bimler  ? ?Glori Bickers, MD  ?11:47 PM ? ? ? ?

## 2021-12-17 NOTE — Progress Notes (Signed)
Treatment plan for Gemcitabine updated to reflect 90 minute infusion for Sarcoma diagnosis.  Updated for entire plan. ? ?Henreitta Leber, PharmD ?12/17/21 @ 0930 ?

## 2021-12-17 NOTE — Patient Instructions (Signed)
Medication Changes: ? ?Start Losartan 25 mg nightly ? ?Start Toprol XL 25 mg nightly ? ?Lab Work: ? ?none ? ?Testing/Procedures: ? ?Your physician has requested that you have an echocardiogram. Echocardiography is a painless test that uses sound waves to create images of your heart. It provides your doctor with information about the size and shape of your heart and how well your heart?s chambers and valves are working. This procedure takes approximately one hour. There are no restrictions for this procedure. ? ? ?Referrals: ? ?none ? ?Special Instructions // Education: ? ?none ? ?Follow-Up in: 4-6 weeks  ? ?At the [facility], you and your health needs are our priority. We have a designated team specialized in the treatment of Heart Failure. This Care Team includes your primary Heart Failure Specialized Cardiologist (physician), Advanced Practice Providers (APPs- Physician Assistants and Nurse Practitioners), and Pharmacist who all work together to provide you with the care you need, when you need it.  ? ?You may see any of the following providers on your designated Care Team at your next follow up: ? ?Dr Glori Bickers ?Dr Loralie Champagne ?Darrick Grinder, NP ?Lyda Jester, PA ?Jessica Milford,NP ?Marlyce Huge, PA ?Audry Riles, PharmD ? ? ?Please be sure to bring in all your medications bottles to every appointment.  ? ?Need to Contact us: ? ?If you have any questions or concerns before your next appointment please send [place] a message through [place] or call our office at [phone].   ? ?TO LEAVE A MESSAGE FOR THE NURSE SELECT OPTION 2, PLEASE LEAVE A MESSAGE INCLUDING: ?YOUR NAME ?DATE OF BIRTH ?CALL BACK NUMBER ?REASON FOR CALL**this is important as we prioritize the call backs ? ?YOU WILL RECEIVE A CALL BACK THE SAME DAY AS LONG AS YOU CALL BEFORE 4:00 PM ? ? ?

## 2021-12-21 ENCOUNTER — Other Ambulatory Visit: Payer: Self-pay

## 2021-12-21 ENCOUNTER — Telehealth: Payer: Self-pay

## 2021-12-21 ENCOUNTER — Encounter: Payer: Self-pay | Admitting: Hematology and Oncology

## 2021-12-21 ENCOUNTER — Telehealth (HOSPITAL_BASED_OUTPATIENT_CLINIC_OR_DEPARTMENT_OTHER): Payer: BC Managed Care – PPO | Admitting: Hematology and Oncology

## 2021-12-21 DIAGNOSIS — K1231 Oral mucositis (ulcerative) due to antineoplastic therapy: Secondary | ICD-10-CM

## 2021-12-21 DIAGNOSIS — J019 Acute sinusitis, unspecified: Secondary | ICD-10-CM | POA: Insufficient documentation

## 2021-12-21 DIAGNOSIS — J0191 Acute recurrent sinusitis, unspecified: Secondary | ICD-10-CM

## 2021-12-21 DIAGNOSIS — C55 Malignant neoplasm of uterus, part unspecified: Secondary | ICD-10-CM

## 2021-12-21 MED ORDER — MAGIC MOUTHWASH W/LIDOCAINE: 5.0000 mL | Freq: Four times a day (QID) | ORAL | 0 refills | Status: AC

## 2021-12-21 MED ORDER — AMOXICILLIN-POT CLAVULANATE 600-42.9 MG/5ML PO SUSR
600.0000 mg | Freq: Two times a day (BID) | ORAL | 0 refills | Status: AC
Start: 2021-12-21 — End: 2021-12-28

## 2021-12-21 MED ORDER — AMOXICILLIN-POT CLAVULANATE 875-125 MG PO TABS: 1.0000 | ORAL_TABLET | Freq: Two times a day (BID) | ORAL | 0 refills | Status: DC

## 2021-12-21 NOTE — Progress Notes (Signed)
HEMATOLOGY-ONCOLOGY ELECTRONIC VISIT PROGRESS NOTE  Patient Care Team: [name], [name], MD as PCP - General (Family Medicine) [name], RN as Oncology Nurse Navigator (Oncology)  I connected with the patient via telephone conference and verified that I am speaking with the correct person using two identifiers. The patient's location is at home and I am providing care from the [facility] I discussed the limitations, risks, security and privacy concerns of performing an evaluation and management service by e-visits and the availability of in person appointments.  I also discussed with the patient that there may be a patient responsible charge related to this service. The patient expressed understanding and agreed to proceed.   ASSESSMENT & PLAN:  Uterine leiomyosarcoma (Tammie Gilmore) She has developed multiple complications related to her treatment I suspect she has oral infection/sinus infection causing severe pain Unfortunately, she cannot be evaluated in person I will send in prescription for antibiotics and Magic mouthwash and reassess tomorrow  Acute sinus infection She has acute sinus infection in the setting of immunocompromise state I recommend broad-spectrum antibiotics with Augmentin  Mucositis due to antineoplastic therapy She has severe mucositis based on history I will try to call in Magic mouthwash with lidocaine swish and swallow I am concerned about her ability to eat and drink I recommend her to take pain medicine around-the-clock and to hydrate as much as possible We will call her for update tomorrow  No orders of the defined types were placed in this encounter.   INTERVAL HISTORY: Please see below for problem oriented charting. The purpose of today's discussion is for urgent evaluation due to multiple side effects from treatment Over the last 2 days, she had progressive soreness at the back of her tongue as well as her throat She have difficulty swallowing  liquids and pills She also have low-grade fever of 99 with associated sinus pain and drainage She felt that she might have oral bleeding Denies nausea vomiting She felt weak with diffuse musculoskeletal pain after recent G-CSF support  SUMMARY OF ONCOLOGIC HISTORY: Oncology History  Uterine leiomyosarcoma ([place])  06/11/2021 Imaging   1. 9.5 x 7.6 x 9.0 cm complex, partially necrotic, mass involving the lower uterine segment/ cervix. No obvious direct extension into the parametrium.  2. 9 mm left pelvic sidewall lymph node is partially necrotic and worrisome for metastatic adenopathy.  3. No findings for abdominal omental or peritoneal surface disease or adenopathy.  4. Tiny low-attenuation lesion in the pancreatic head, likely benign cyst but attention on follow-up scans is suggested.  5. 2.9 cm fundal fibroid.    06/19/2021 Pathology Results   FINAL MICROSCOPIC DIAGNOSIS:   A. UTERINE, CERVICAL MASS, BIOPSY:  - Spindle cell malignancy.  - See comment.   COMMENT:  The biopsies consist of endocervical mucosa with stromal edema and one biopsy fragment has a microscopic focus with atypical spindle cells consistent with poorly differentiated malignancy.  The differential  includes a spindle cell malignancy such as sarcomatoid carcinoma and leiomyosarcoma.  Mullerian adenosarcoma is also a consideration but considered less likely   06/23/2021 Imaging   MR pelvis  10 cm uterine mass with central necrosis, which is centered in the cervix and lower uterine segment. Right parametrial involvement is seen as well as suspected invasion of the distal rectum. Differential diagnosis includes cervical carcinoma and uterine leiomyosarcoma.   Mild bilateral iliac lymphadenopathy, highly suspicious for metastatic disease.   2.9 cm subserosal fibroid in the posterior fundus.   Normal appearance of both  ovaries.     06/29/2021 PET scan   1. Hypermetabolic necrotic cervical/uterine mass with bilateral  external iliac hypermetabolic lymph nodes. No evidence of distant metastatic disease. 2. 1.5 cm low-attenuation left thyroid nodule. Recommend thyroid ultrasound. (Ref: J Am Coll Radiol. 2015 Feb;12(2): 143-50).   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, pleomorphic, and myxoid areas and associated necrosis (~20%) - Tumor based in cervix and also involves lower uterine segment - Cervicovaginal margin involved by focal invasive leiomyosarcoma (3:00-5:00, A10) as well as tumor in lymphovascular spaces - Leiomyosarcoma involves right and left parametrial tissue and extends to parametrial margins - Extensive lymphovascular space invasion present, including in uterus and parametria - See synoptic report and comment   Other findings: - Leiomyomata with hyalinization, size up to 3.0 cm - Ovaries and fallopian tubes with no parenchymal involvement by leiomyosarcoma identified, although adnexal lymphovascular space invasion is present   B: Lymph nodes, right pelvic, lymphadenectomy - One of four lymph nodes positive for metastatic leiomyosarcoma (1/4), with extracapsular extension present   C: Lymph nodes, left pelvic, lymphadenectomy - One of four lymph nodes positive for metastatic leiomyosarcoma (1/4), with extracapsular extension present  Immunohistochemical stains are performed on block A11, and demonstrate that the tumor is positive for desmin and CD10, with SMA staining the majority of the spindle cell component but largely negative in the epithelioid / pleomorphic component. OSCAR, pancytokeratin AE1/AE3, HMB45, and PR appear negative in the tumor. ER shows patchy weak staining and myogenin stains rare cells. Block A23 also shows positive desmin and negative OSCAR pancytokeratin. Overall, the findings are most  consistent with leiomyosarcoma, with extensive areas that are myxoid, epithelioid, and pleomorphic as well as more typical spindle cell areas within the overall high grade tumor (grade 3 / 3). The tumor is staged as pT2b (involves other pelvic tissues) given the parametrial involvement and pN1 for FIGO stage IIIC.    07/17/2021 Surgery   Date of Surgery: 07/17/21  Preoperative Diagnosis: High Grade Uterine Sarcoma  Postoperative Diagnosis: Same  Procedure(s): Bilateral - RADICAL ABDOMINAL HYSTER, W/BIL TOTAL PELVIC LYMPHADENECTOMY & PARA-AORTIC LYMPH NODE BX W/WO REM TUBE/OVAR VAGINAL HYSTERECTOMY, FOR UTERUS 250 G OR LESS; WITH REPAIR OF ENTEROCELE COLECTOMY, PARTIAL; WITH COLOPROCTOSTOMY (LOW PELVIC ANASTOMOSIS) WITH COLOSTOMY CYSTOURETHROSCOPY, WITH INSERTION OF INDWELLING URETERAL STENT (EG, GIBBONS OR DOUBLE-J TYPE) - Cystourethroscopy - Bilateral ureteral stent placement - Foley catheter placement  Performing Service: Gynecology Oncology Surgeon(s) and Role: Panel 1: * Lafonda Mosses, MD - Primary * Bernadene Bell, MD - Resident - Assisting * Devonne Doughty, MD - Resident - Assisting Panel 2: * Franchot Erichsen, MD - Primary  Drains:  - Left 6Fr open-ended ureteral access catheter (green) - Right 5Fr open-ended ureteral access catheter (white) - 16Fr foley catheter to drainage  * No implants in log *  Indications: 56 y.o. female with high grade uterine sarcoma. Urology was consulted pre-operatively for placement of bilateral ureteral stents. Risks, benefits, and alternatives of the above procedure were discussed and informed consent was signed.  OperativeFindings:  - Grossly distorted architecture of urinary bladder likely 2/2 pelvic mass with anterolaterally positioned UOs - Successful placement of bilateral open-ended ureteral catheters under direct visualization - Foley catheter placed at case conclusion  Description: The patient was correctly identified  in the preop holding area where written informed consent as well  potential risk and complication reviewed. She agreed. The patient was brought to the operative suite where a preinduction timeout was performed. Once correct information was verified, general anesthesia was induced. The patient was then gently placed into dorsal lithotomy position with SCDs in place for VTE prophylaxis. They were prepped and draped in the usual sterile fashion and given appropriate preoperative antibiotics. A second timeout was then performed.   We inserted a 66F rigid cystoscope per urethra with copious lubrication and normal saline irrigation running. We performed cystourethroscopy, which revealed the above findings.  We turned our attention to the left ureteral orifice and canulated it with a sensor wire, using assistance of a 6Fr open-ended catheter. The wire was advanced into the renal pelvis without difficulty under visual guidance. We then advanced the stent over our wire into the renal pelvis under direct visualization and feel without complication. The wire was subsequently removed.   We then turned our attention to the right ureteral orifice and canulated it with a sensor wire, using assistance of a 5Fr open-ended catheter. The wire was advanced into the renal pelvis without difficulty under visual guidance. We then advanced the stent over our wire into the renal pelvis under direct visualization and feel without complication. The wire was subsequently removed.   A 16Fr straight catheter was placed, with return of urine indicating appropriate position within the bladder. The balloon was inflated with 10cc sterile water. The stents were secured to the Foley using 0-silk ties, being careful not to occlude the stents or Foley.   The patient was awoken from general anesthesia having tolerated the procedure well and taken to the PACU for routine post-operative recovery.  Post-Op Plan:  - Foley and stents per primary  team    07/17/2021 Surgery   Date of Surgery: 07/17/2021  Pre-op Diagnosis: Uterine spindle cell malignancy  Post-op Diagnosis: Same  Procedure(s): Panel 1 RADICAL ABDOMINAL HYSTER, with bilateral S&O, vagineconty upper, bilateral pelvic lyphadenectomy, bilateral ureterolysis,: 96759 (CPT) Panel 2 CYSTOURETHROSCOPY, WITH INSERTION OF INDWELLING URETERAL STENT (EG, GIBBONS OR DOUBLE-J TYPE): 16384 (CPT) Note: Revisions to procedures should be made in chart - see Procedures activity.  Performing Service: Gynecology Oncology Surgeon(s) and Role: Panel 1: * Lafonda Mosses, MD - Primary * Bernadene Bell, MD - Resident - Assisting * Devonne Doughty, MD - Resident - Assisting Panel 2: * Franchot Erichsen, MD - Primary  Findings: On bimanual exam, 10cm necrotic mass filling upper vagina, unable to discretely palpate the cervix. On rectovaginal exam, rectal involvement not identified. Intraoperatively, normal upper abdominal survey including normal liver, diaphragm, stomach, omentum and bowel. Small uterus with 10cm mass expanding the cervix. Palpably enlarged bilateral pelvic lymph nodes adherent to the external iliac veins and obturator nerves, removed. No palpable para-aortic lymphadenopathy. No rectal involvement of uterine mass.   Specimens:  ID Type Source Tests Collected by Time Destination  1 : uterus,cervix,bilateral tubes/ovaries Tissue Uterus SURGICAL PATHOLOGY EXAM Lafonda Mosses, MD 07/17/2021 0932  2 : right pelvic lymph node Tissue Lymph Node SURGICAL PATHOLOGY EXAM Lafonda Mosses, MD 07/17/2021 1125  3 : LEFT PELVIC LN Tissue Lymph Node SURGICAL PATHOLOGY EXAM Lafonda Mosses, MD 07/17/2021 1144    08/06/2021 Initial Diagnosis   Uterine leiomyosarcoma ([place])   08/06/2021 Cancer Staging   Staging form: Corpus Uteri - Leiomyosarcoma and Endometrial Stromal Sarcoma, AJCC 8th Edition - Pathologic stage from 08/06/2021: FIGO Stage IVB (pT3, pN1, cM1) - Signed  by [name], MD on 08/11/2021 Stage  prefix: Initial diagnosis    08/10/2021 Imaging   CT abdomen and pelvis 1. Interval development of left lobe pulmonary nodules, measuring up to 7 mm and highly for metastatic disease. 2. Interval development of small to upper normal lymph nodes in the pelvis, concerning for metastatic disease. 3. Postoperative seroma left pelvic sidewall. 4. Tiny cluster of tree-in-bud opacity in the peripheral right lower lobe is new and compatible with sequelae of atypical infection.   08/13/2021 Procedure   Procedure: Placement of a right IJ approach single lumen PowerPort.  Tip is positioned at the superior cavoatrial junction and catheter is ready for immediate use.  Complications: No immediate   08/14/2021 Echocardiogram    1. Left ventricular ejection fraction, by estimation, is 60 to 65%. The left ventricle has normal function. The left ventricle has no regional wall motion abnormalities. Left ventricular diastolic parameters were normal. The average left ventricular global longitudinal strain is -17.4 %. The global longitudinal strain is normal.  2. Right ventricular systolic function is normal. The right ventricular size is normal.  3. The mitral valve is normal in structure. No evidence of mitral valve regurgitation. No evidence of mitral stenosis.  4. The aortic valve is tricuspid. Aortic valve regurgitation is not visualized. No aortic stenosis is present.  5. The inferior vena cava is normal in size with greater than 50% respiratory variability, suggesting right atrial pressure of 3 mmHg.     08/17/2021 Imaging   Multiple new and enlarging pulmonary nodules scattered throughout the lungs bilaterally, highly concerning for progressive metastatic disease to the lungs   08/18/2021 - 11/10/2021 Chemotherapy   Patient is on Treatment Plan : UTERINE LEIOMYOSARCOMA Doxorubicin q21d x 6 Cycles     11/09/2021 Imaging   IMPRESSION: 1. Multiple small bilateral  pulmonary nodules, some of which are slightly increased in size. Other nodules unchanged. 2. Interval decrease in size of left pelvic sidewall lymph nodes. 3. Unchanged size of perirectal lymph nodes or soft tissue nodules. These however demonstrate new internal hypodensity, suggesting treatment response and internal necrosis. 4. Unchanged left iliac lymph node or peritoneal nodule. 5. Findings are consistent with mixed response to treatment. No evidence of new metastatic disease in the chest, abdomen, or pelvis. 6. Wall thickening and mucosal hyperenhancement of the bladder, consistent with nonspecific infectious or inflammatory cystitis. Correlate with urinalysis. 7. Status post hysterectomy and oophorectomy. Interval resolution of a previously noted left pelvic hematoma or seroma. 8. Trace, nonspecific free fluid in the low pelvis.   11/30/2021 Echocardiogram    1. Left ventricular ejection fraction, by estimation, is 40 to 45%. Left ventricular ejection fraction by 3D volume is 41 %. The left ventricle has mildly decreased function. The left ventricle has no regional wall motion abnormalities. Left ventricular  diastolic parameters are consistent with Grade I diastolic dysfunction (impaired relaxation).  2. Right ventricular systolic function is moderately reduced. The right ventricular size is normal.  3. The mitral valve is grossly normal. No evidence of mitral valve regurgitation.  4. The aortic valve is normal in structure. Aortic valve regurgitation is not visualized. No aortic stenosis is present.     12/07/2021 -  Chemotherapy   Patient is on Treatment Plan : UTERINE UNDIFFERENTIATED / LEIOMYOSARCOMA Gemcitabine D1,8 + Docetaxel D8 (900/100) q21d     Pulmonary metastases ([place])  08/11/2021 Initial Diagnosis   Pulmonary metastases ([place])   08/18/2021 - 11/10/2021 Chemotherapy   Patient is on Treatment Plan : UTERINE LEIOMYOSARCOMA Doxorubicin q21d x 6 Cycles  11/09/2021 Imaging    IMPRESSION: 1. Multiple small bilateral pulmonary nodules, some of which are slightly increased in size. Other nodules unchanged. 2. Interval decrease in size of left pelvic sidewall lymph nodes. 3. Unchanged size of perirectal lymph nodes or soft tissue nodules. These however demonstrate new internal hypodensity, suggesting treatment response and internal necrosis. 4. Unchanged left iliac lymph node or peritoneal nodule. 5. Findings are consistent with mixed response to treatment. No evidence of new metastatic disease in the chest, abdomen, or pelvis. 6. Wall thickening and mucosal hyperenhancement of the bladder, consistent with nonspecific infectious or inflammatory cystitis. Correlate with urinalysis. 7. Status post hysterectomy and oophorectomy. Interval resolution of a previously noted left pelvic hematoma or seroma. 8. Trace, nonspecific free fluid in the low pelvis.   12/07/2021 -  Chemotherapy   Patient is on Treatment Plan : UTERINE UNDIFFERENTIATED / LEIOMYOSARCOMA Gemcitabine D1,8 + Docetaxel D8 (900/100) q21d       REVIEW OF SYSTEMS:   Eyes: Denies blurriness of vision Respiratory: Denies cough, dyspnea or wheezes Cardiovascular: Denies palpitation, chest discomfort Gastrointestinal:  Denies nausea, heartburn or change in bowel habits Skin: Denies abnormal skin rashes Lymphatics: Denies new lymphadenopathy or easy bruising Neurological:Denies numbness, tingling or new weaknesses Behavioral/Psych: Mood is stable, no new changes  Extremities: No lower extremity edema All other systems were reviewed with the patient and are negative.  I have reviewed the past medical history, past surgical history, social history and family history with the patient and they are unchanged from previous note.  ALLERGIES:  is allergic to doxycycline.  MEDICATIONS:  Current Outpatient Medications  Medication Sig Dispense Refill   acetaminophen (TYLENOL) 500 MG tablet Take 1,000 mg by mouth  every 6 (six) hours as needed for moderate pain or headache.     bisacodyl 5 MG EC tablet Take 1 tablet (5 mg total) by mouth daily as needed for moderate constipation. 30 tablet 3   dexamethasone (DECADRON) 4 MG tablet Take 2 tablets (8 mg total) by mouth daily. Start the day before Taxotere. Then daily after chemo for 2 days. 30 tablet 1   lidocaine-prilocaine (EMLA) cream Apply to affected area once 30 g 3   LORazepam (ATIVAN) 0.5 MG tablet Take 1 tablet (0.5 mg total) by mouth 2 (two) times daily as needed for anxiety. 30 tablet 0   losartan (COZAAR) 25 MG tablet Take 1 tablet (25 mg total) by mouth at bedtime. 30 tablet 11   metoprolol succinate (TOPROL XL) 25 MG 24 hr tablet Take 1 tablet (25 mg total) by mouth at bedtime. 30 tablet 11   ondansetron (ZOFRAN) 8 MG tablet Take 1 tablet (8 mg total) by mouth every 8 (eight) hours as needed. 30 tablet 1   oxyCODONE (OXY IR/ROXICODONE) 5 MG immediate release tablet Take 1 tablet (5 mg total) by mouth every 4 (four) hours as needed for severe pain. 30 tablet 0   prochlorperazine (COMPAZINE) 10 MG tablet Take 1 tablet (10 mg total) by mouth every 6 (six) hours as needed (Nausea or vomiting). 90 tablet 1   No current facility-administered medications for this visit.    PHYSICAL EXAMINATION: ECOG PERFORMANCE STATUS: 1 - Symptomatic but completely ambulatory  LABORATORY DATA:  I have reviewed the data as listed CMP Latest Ref Rng & Units 12/14/2021 12/07/2021 11/30/2021  Glucose 70 - 99 mg/dL 110(H) 108(H) 102(H)  BUN 6 - 20 mg/dL '16 11 8  '$ Creatinine 0.44 - 1.00 mg/dL 0.79 0.67 0.70  Sodium 135 - 145  mmol/L 137 141 142  Potassium 3.5 - 5.1 mmol/L 3.9 3.8 4.0  Chloride 98 - 111 mmol/L 104 107 106  CO2 22 - 32 mmol/L '23 29 31  '$ Calcium 8.9 - 10.3 mg/dL 9.9 9.2 9.3  Total Protein 6.5 - 8.1 g/dL 7.6 6.8 6.8  Total Bilirubin 0.3 - 1.2 mg/dL 0.3 0.3 0.3  Alkaline Phos 38 - 126 U/L 113 66 76  AST 15 - 41 U/L 73(H) 15 21  ALT 0 - 44 U/L 173(H) 13 17     Lab Results  Component Value Date   WBC 4.4 12/14/2021   HGB 10.7 (L) 12/14/2021   HCT 31.3 (L) 12/14/2021   MCV 89.4 12/14/2021   PLT 158 12/14/2021   NEUTROABS 3.4 12/14/2021     RADIOGRAPHIC STUDIES: I have personally reviewed the radiological images as listed and agreed with the findings in the report. ECHOCARDIOGRAM COMPLETE  Result Date: 11/30/2021    ECHOCARDIOGRAM REPORT   Patient Name:   Tammie Gilmore Date of Exam: 11/30/2021 Medical Rec #:  [id number]         Height:       68.0 in Accession #:    [id number]        Weight:       129.4 lb Date of Birth:  28-Oct-1965        BSA:          1.698 m Patient Age:    56 years          BP:           134/83 mmHg Patient Gender: F                 HR:           90 bpm. Exam Location:  Inpatient Procedure: 2D Echo, 3D Echo and Strain Analysis Indications:    Chemotherapy  History:        Patient has prior history of Echocardiogram examinations, most                 recent 08/14/2021.  Sonographer:    Jefferey Pica Referring Phys: [id number] Caitriona Sundquist [place]  1. Left ventricular ejection fraction, by estimation, is 40 to 45%. Left ventricular ejection fraction by 3D volume is 41 %. The left ventricle has mildly decreased function. The left ventricle has no regional wall motion abnormalities. Left ventricular  diastolic parameters are consistent with Grade I diastolic dysfunction (impaired relaxation).  2. Right ventricular systolic function is moderately reduced. The right ventricular size is normal.  3. The mitral valve is grossly normal. No evidence of mitral valve regurgitation.  4. The aortic valve is normal in structure. Aortic valve regurgitation is not visualized. No aortic stenosis is present. FINDINGS  Left Ventricle: Left ventricular ejection fraction, by estimation, is 40 to 45%. Left ventricular ejection fraction by 3D volume is 41 %. The left ventricle has mildly decreased function. The left ventricle has no regional wall motion  abnormalities. The  left ventricular internal cavity size was normal in size. There is no left ventricular hypertrophy. Left ventricular diastolic parameters are consistent with Grade I diastolic dysfunction (impaired relaxation). Right Ventricle: The right ventricular size is normal. Right vetricular wall thickness was not well visualized. Right ventricular systolic function is moderately reduced. Left Atrium: Left atrial size was normal in size. Right Atrium: Right atrial size was normal in size. Pericardium: There is no evidence of pericardial effusion. Mitral Valve: The mitral valve is grossly normal.  No evidence of mitral valve regurgitation. Tricuspid Valve: The tricuspid valve is normal in structure. Tricuspid valve regurgitation is not demonstrated. Aortic Valve: The aortic valve is normal in structure. Aortic valve regurgitation is not visualized. No aortic stenosis is present. Aortic valve peak gradient measures 5.9 mmHg. Pulmonic Valve: The pulmonic valve was normal in structure. Pulmonic valve regurgitation is not visualized. Aorta: The aortic root and ascending aorta are structurally normal, with no evidence of dilitation. IAS/Shunts: The atrial septum is grossly normal.  LEFT VENTRICLE PLAX 2D LVIDd:         3.90 cm         Diastology LVIDs:         3.35 cm         LV e' medial:    6.60 cm/s LV PW:         1.00 cm         LV E/e' medial:  9.2 LV IVS:        0.90 cm         LV e' lateral:   7.43 cm/s                                LV E/e' lateral: 8.2  LV Volumes (MOD)               2D LV vol d, MOD    56.5 ml       Longitudinal A4C:                           Strain LV vol s, MOD    39.0 ml       2D Strain GLS  -16.5 % A4C:                           Avg: LV SV MOD A4C:   56.5 ml                                3D Volume EF                                LV 3D EF:    Left                                             ventricul                                             ar                                              ejection                                             fraction  by 3D                                             volume is                                             41 %.                                 3D Volume EF:                                3D EF:        41 %                                LV EDV:       97 ml                                LV ESV:       58 ml                                LV SV:        39 ml RIGHT VENTRICLE            IVC RV S prime:     8.10 cm/s  IVC diam: 1.50 cm TAPSE (M-mode): 1.3 cm LEFT ATRIUM             Index        RIGHT ATRIUM          Index LA diam:        2.20 cm 1.30 cm/m   RA Area:     7.59 cm LA Vol (A2C):   19.5 ml 11.48 ml/m  RA Volume:   12.80 ml 7.54 ml/m LA Vol (A4C):   18.8 ml 11.07 ml/m LA Biplane Vol: 20.2 ml 11.89 ml/m  AORTIC VALVE              PULMONIC VALVE AV Vmax:      121.00 cm/s PV Vmax:       0.99 m/s AV Peak Grad: 5.9 mmHg    PV Peak grad:  3.9 mmHg LVOT Vmax:    89.40 cm/s LVOT Vmean:   57.700 cm/s LVOT VTI:     0.144 m  AORTA Ao Root diam: 2.90 cm Ao Asc diam:  3.20 cm MITRAL VALVE MV Area (PHT): 3.28 cm    SHUNTS MV Decel Time: 231 msec    Systemic VTI: 0.14 m MV E velocity: 60.60 cm/s MV A velocity: 77.10 cm/s MV E/A ratio:  0.79 Mertie Moores MD Electronically signed by Mertie Moores MD Signature Date/Time: 11/30/2021/11:35:10 AM    Final     I discussed the assessment and treatment plan with the patient. The patient was provided an opportunity to ask questions and all were answered. The patient agreed with the plan and demonstrated an understanding of the instructions. The patient  was advised to call back or seek an in-person evaluation if the symptoms worsen or if the condition fails to improve as anticipated.    I spent 30 minutes for the appointment reviewing test results, discuss management and coordination of care.  [name], MD 12/21/2021 11:48 AM

## 2021-12-21 NOTE — Assessment & Plan Note (Signed)
She has severe mucositis based on history ?I will try to call in Magic mouthwash with lidocaine swish and swallow ?I am concerned about her ability to eat and drink ?I recommend her to take pain medicine around-the-clock and to hydrate as much as possible ?We will call her for update tomorrow ?

## 2021-12-21 NOTE — Telephone Encounter (Signed)
Called back and scheduled virtual visit at 1140. She is aware of appt time. ?

## 2021-12-21 NOTE — Assessment & Plan Note (Signed)
She has developed multiple complications related to her treatment ?I suspect she has oral infection/sinus infection causing severe pain ?Unfortunately, she cannot be evaluated in person ?I will send in prescription for antibiotics and Magic mouthwash and reassess tomorrow ?

## 2021-12-21 NOTE — Telephone Encounter (Signed)
Yes, I recommend an appointment if she can drive here, if not, virtual visit ?

## 2021-12-21 NOTE — Telephone Encounter (Signed)
She called and left a message. ?She is complaining of a large sore area to the back of her tongue. The area was itching and she was scratching with her teeth. She is complaining of sinus issues with some bleeding to nasal cavity from dryness. C/o of a sore throat. ? ?She is asking for Rx.  ?Should I offer appt today? ?

## 2021-12-21 NOTE — Assessment & Plan Note (Signed)
She has acute sinus infection in the setting of immunocompromise state ?I recommend broad-spectrum antibiotics with Augmentin ?

## 2021-12-23 ENCOUNTER — Other Ambulatory Visit: Payer: Self-pay

## 2021-12-23 ENCOUNTER — Emergency Department (HOSPITAL_COMMUNITY): Payer: BC Managed Care – PPO

## 2021-12-23 ENCOUNTER — Encounter (HOSPITAL_COMMUNITY): Payer: Self-pay

## 2021-12-23 ENCOUNTER — Emergency Department (HOSPITAL_COMMUNITY)
Admission: EM | Admit: 2021-12-23 | Discharge: 2021-12-24 | Disposition: A | Discharge status: Home / Self Care | Payer: BC Managed Care – PPO | Attending: Emergency Medicine | Admitting: Emergency Medicine

## 2021-12-23 DIAGNOSIS — Z20822 Contact with and (suspected) exposure to covid-19: Secondary | ICD-10-CM | POA: Diagnosis not present

## 2021-12-23 DIAGNOSIS — Z8542 Personal history of malignant neoplasm of other parts of uterus: Secondary | ICD-10-CM | POA: Diagnosis not present

## 2021-12-23 DIAGNOSIS — D696 Thrombocytopenia, unspecified: Secondary | ICD-10-CM | POA: Diagnosis not present

## 2021-12-23 DIAGNOSIS — D72829 Elevated white blood cell count, unspecified: Secondary | ICD-10-CM | POA: Diagnosis not present

## 2021-12-23 DIAGNOSIS — S0083XA Contusion of other part of head, initial encounter: Secondary | ICD-10-CM | POA: Insufficient documentation

## 2021-12-23 DIAGNOSIS — R5383 Other fatigue: Secondary | ICD-10-CM | POA: Insufficient documentation

## 2021-12-23 DIAGNOSIS — R319 Hematuria, unspecified: Secondary | ICD-10-CM | POA: Insufficient documentation

## 2021-12-23 DIAGNOSIS — W19XXXA Unspecified fall, initial encounter: Secondary | ICD-10-CM | POA: Insufficient documentation

## 2021-12-23 DIAGNOSIS — S0990XA Unspecified injury of head, initial encounter: Secondary | ICD-10-CM | POA: Diagnosis present

## 2021-12-23 LAB — COMPREHENSIVE METABOLIC PANEL
ALT: 81 U/L — ABNORMAL HIGH (ref 0–44)
AST: 39 U/L (ref 15–41)
Albumin: 3.8 g/dL (ref 3.5–5.0)
Alkaline Phosphatase: 171 U/L — ABNORMAL HIGH (ref 38–126)
Anion gap: 9 (ref 5–15)
BUN: 10 mg/dL (ref 6–20)
CO2: 27 mmol/L (ref 22–32)
Calcium: 8.9 mg/dL (ref 8.9–10.3)
Chloride: 97 mmol/L — ABNORMAL LOW (ref 98–111)
Creatinine, Ser: 0.74 mg/dL (ref 0.44–1.00)
GFR, Estimated: >60 mL/min (ref >60)
Glucose, Bld: 122 mg/dL — ABNORMAL HIGH (ref 70–99)
Potassium: 3.2 mmol/L — ABNORMAL LOW (ref 3.5–5.1)
Sodium: 133 mmol/L — ABNORMAL LOW (ref 135–145)
Total Bilirubin: 0.4 mg/dL (ref 0.3–1.2)
Total Protein: 7.1 g/dL (ref 6.5–8.1)

## 2021-12-23 LAB — TROPONIN I (HIGH SENSITIVITY): Troponin I (High Sensitivity): 67 ng/L — ABNORMAL HIGH (ref <18)

## 2021-12-23 LAB — TSH: TSH: 1.604 u[IU]/mL (ref 0.350–4.500)

## 2021-12-23 LAB — CBC WITH DIFFERENTIAL/PLATELET
Abs Immature Granulocytes: 2.1 10*3/uL — ABNORMAL HIGH (ref 0.00–0.07)
Band Neutrophils: 4 %
Basophils Absolute: 0 10*3/uL (ref 0.0–0.1)
Basophils Relative: 0 %
Eosinophils Absolute: 0 10*3/uL (ref 0.0–0.5)
Eosinophils Relative: 0 %
HCT: 32.6 % — ABNORMAL LOW (ref 36.0–46.0)
Hemoglobin: 10.7 g/dL — ABNORMAL LOW (ref 12.0–15.0)
Lymphocytes Relative: 6 %
Lymphs Abs: 1.4 10*3/uL (ref 0.7–4.0)
MCH: 30 pg (ref 26.0–34.0)
MCHC: 32.8 g/dL (ref 30.0–36.0)
MCV: 91.3 fL (ref 80.0–100.0)
Metamyelocytes Relative: 1 %
Monocytes Absolute: 1.4 10*3/uL — ABNORMAL HIGH (ref 0.1–1.0)
Monocytes Relative: 6 %
Myelocytes: 8 %
Neutro Abs: 18.6 10*3/uL — ABNORMAL HIGH (ref 1.7–7.7)
Neutrophils Relative %: 75 %
Platelets: 29 10*3/uL — CL (ref 150–400)
RBC: 3.57 MIL/uL — ABNORMAL LOW (ref 3.87–5.11)
RDW: 12.9 % (ref 11.5–15.5)
WBC: 23.5 10*3/uL — ABNORMAL HIGH (ref 4.0–10.5)
nRBC: 1 % — ABNORMAL HIGH (ref 0.0–0.2)

## 2021-12-23 LAB — MAGNESIUM: Magnesium: 1.9 mg/dL (ref 1.7–2.4)

## 2021-12-23 MED ORDER — SODIUM CHLORIDE 0.9 % IV BOLUS
1000.0000 mL | Freq: Once | INTRAVENOUS | Status: AC
  Administered 2021-12-23: 1000 mL via INTRAVENOUS

## 2021-12-23 NOTE — ED Triage Notes (Signed)
Pt reports increased weakness since starting a new cancer tx 2 weeks ago. Pt reports falling on Monday causing a bruise on both sides of her nose.  ?

## 2021-12-23 NOTE — ED Provider Notes (Signed)
?[facility] DEPT ?Provider Note ? ? ?CSN: [id number] ?Arrival date & time: 12/23/21  2115 ? ?  ? ?History ? ?Chief Complaint  ?Patient presents with  ? Weakness  ? Fall  ? ? ?Tammie Gilmore is a 55 y.o. female. ? ? ?Weakness ?Fall ? 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 ?She is presented emergency room today with complaint of generalized weakness since approximately 1 week ago she states this is approximately when she was switched from doxorubicin to a different chemotherapeutic agent which was thought to be 1 that will cause fewer side effects. From chart review it appears she is now on  Gemcitabine/docetaxel which she began taking 12/07/2021 and received her second dose/infusion 12/14/2021 ? ?On further questioning it seems that her fatigue has been persistent since her first infusion which is closer to 3 weeks now. ? ?She also received a infusion of PEG filgrastim 12/16/2021. ? ?Patient's surgical removal of primary tumor was 07/17/2021. ? ?  ? ?Home Medications ?Prior to Admission medications   ?Medication Sig Start Date End Date Taking? Authorizing Provider  ?acetaminophen (TYLENOL) 500 MG tablet Take 1,000 mg by mouth every 6 (six) hours as needed for moderate pain or headache.   Yes [provider]  ?amoxicillin-clavulanate (AUGMENTIN) 600-42.9 MG/5ML suspension Take 5 mLs (600 mg total) by mouth 2 (two) times daily for 7 days. 12/21/21 12/28/21 Yes [name], MD  ?bisacodyl 5 MG EC tablet Take 1 tablet (5 mg total) by mouth daily as needed for moderate constipation. 11/10/21  Yes Gorsuch, Ni, MD  ?dexamethasone (DECADRON) 4 MG tablet Take 2 tablets (8 mg total) by mouth daily. Start the day  before Taxotere. Then daily after chemo for 2 days. 12/07/21  Yes Gorsuch, Ernst Spell, MD  ?lidocaine-prilocaine (EMLA) cream Apply to affected area once 08/17/21  Yes Gorsuch, Ni, MD  ?loratadine (CLARITIN) 10 MG tablet Take 10 mg by mouth daily as needed (for bone aches).   Yes [provider]  ?magic mouthwash w/lidocaine SOLN Take 5 mLs by mouth 4 (four) times daily for 7 days. Swish and swallow 5 mls 4 x a day for 7 days. 12/21/21 12/28/21 Yes [name], MD  ?prochlorperazine (COMPAZINE) 10 MG tablet Take 1 tablet (10 mg total) by mouth every 6 (six) hours as needed (Nausea or vomiting). 09/29/21  Yes [name], MD  ?LORazepam (ATIVAN) 0.5 MG tablet Take 1 tablet (0.5 mg total) by mouth 2 (two) times daily as needed for anxiety. ?Patient not taking: Reported on 12/24/2021 12/14/21   [name], MD  ?losartan (COZAAR) 25 MG tablet Take 1 tablet (25 mg total) by mouth at bedtime. ?Patient not taking: Reported on 12/24/2021 12/17/21   Bensimhon, Shaune Pascal, MD  ?metoprolol succinate (TOPROL XL) 25 MG 24 hr tablet Take 1 tablet (25 mg total) by mouth at bedtime. ?Patient not taking: Reported on 12/24/2021 12/17/21   Bensimhon, Shaune Pascal, MD  ?ondansetron (ZOFRAN) 8 MG tablet Take 1 tablet (8 mg total) by mouth every 8 (eight) hours as needed. ?Patient not taking: Reported on 12/24/2021 08/17/21   [name], MD  ?oxyCODONE (OXY IR/ROXICODONE) 5 MG immediate release tablet Take 1 tablet (5 mg total) by mouth every 4 (four) hours as needed for severe pain. 12/16/21   [name], MD  ?   ? ?  Allergies    ?Doxycycline   ? ?Review of Systems   ?Review of Systems  ?Neurological:  Positive for weakness.  ? ?Physical Exam ?Updated Vital Signs ?BP 113/75   Pulse (!) 117   Temp 97.8 ?F (36.6 ?C)   Resp 18   Ht '5\' 8"'$  (1.727 m)   Wt 56.7 kg   SpO2 97%   BMI 19.01 kg/m?  ?Physical Exam ?Vitals and nursing note reviewed.  ?Constitutional:   ?   General: She is not in acute distress. ?   Comments: Thin 56 year old female, pleasant,  calm, able answer questions appropriate follow commands in no distress  ?HENT:  ?   Head: Normocephalic and atraumatic.  ?   Nose: Nose normal.  ?   Mouth/Throat:  ?   Mouth: Mucous membranes are dry.  ?   Comments: Small oral ulceration lesion present below the left side of tongue ?Eyes:  ?   General: No scleral icterus. ?Cardiovascular:  ?   Rate and Rhythm: Normal rate and regular rhythm.  ?   Pulses: Normal pulses.  ?   Heart sounds: Normal heart sounds.  ?Pulmonary:  ?   Effort: Pulmonary effort is normal. No respiratory distress.  ?   Breath sounds: Normal breath sounds. No wheezing.  ?Abdominal:  ?   Palpations: Abdomen is soft.  ?   Tenderness: There is no abdominal tenderness. There is no guarding or rebound.  ?Musculoskeletal:  ?   Cervical back: Normal range of motion.  ?   Right lower leg: No edema.  ?   Left lower leg: No edema.  ?Skin: ?   General: Skin is warm and dry.  ?   Capillary Refill: Capillary refill takes less than 2 seconds.  ?Neurological:  ?   Mental Status: She is alert. Mental status is at baseline.  ?Psychiatric:     ?   Mood and Affect: Mood normal.     ?   Behavior: Behavior normal.  ? ? ?ED Results / Procedures / Treatments   ?Labs ?(all labs ordered are listed, but only abnormal results are displayed) ?Labs Reviewed  ?CBC WITH DIFFERENTIAL/PLATELET - Abnormal; Notable for the following components:  ?    Result Value  ? WBC 23.5 (*)   ? RBC 3.57 (*)   ? Hemoglobin 10.7 (*)   ? HCT 32.6 (*)   ? Platelets 29 (*)   ? nRBC 1.0 (*)   ? Neutro Abs 18.6 (*)   ? Monocytes Absolute 1.4 (*)   ? Abs Immature Granulocytes 2.10 (*)   ? All other components within normal limits  ?COMPREHENSIVE METABOLIC PANEL - Abnormal; Notable for the following components:  ? Sodium 133 (*)   ? Potassium 3.2 (*)   ? Chloride 97 (*)   ? Glucose, Bld 122 (*)   ? ALT 81 (*)   ? Alkaline Phosphatase 171 (*)   ? All other components within normal limits  ?URINALYSIS, ROUTINE W REFLEX MICROSCOPIC - Abnormal; Notable  for the following components:  ? Specific Gravity, Urine <1.005 (*)   ? Hgb urine dipstick SMALL (*)   ? Protein, ur TRACE (*)   ? Leukocytes,Ua TRACE (*)   ? All other components within normal limits  ?URINALYSIS, MICROSCOPIC (REFLEX) - Abnormal; Notable for the following components:  ? Bacteria, UA FEW (*)   ? All other components within normal limits  ?TROPONIN I (HIGH SENSITIVITY) - Abnormal; Notable for the following components:  ? Troponin I (High Sensitivity)  67 (*)   ? All other components within normal limits  ?TROPONIN I (HIGH SENSITIVITY) - Abnormal; Notable for the following components:  ? Troponin I (High Sensitivity) 83 (*)   ? All other components within normal limits  ?RESP PANEL BY RT-PCR (FLU A&B, COVID) [id number]  ?CULTURE, BLOOD (ROUTINE X 2)  ?CULTURE, BLOOD (ROUTINE X 2)  ?URINE CULTURE  ?TSH  ?MAGNESIUM  ?LACTIC ACID, PLASMA  ? ? ?EKG ?EKG Interpretation ? ?Date/Time:  Wednesday December 23 2021 22:36:29 EDT ?Ventricular Rate:  109 ?PR Interval:  129 ?QRS Duration: 72 ?QT Interval:  333 ?QTC Calculation: 449 ?R Axis:   59 ?Text Interpretation: Sinus tachycardia Ventricular premature complex Aberrant complex Minimal ST depression, inferior leads Confirmed by Quintella Reichert [phone]) on 12/23/2021 11:43:14 PM ? ?Radiology ?DG Chest 2 View ? ?Result Date: 12/23/2021 ?CLINICAL DATA:  Fatigue. Increased weakness since beginning new cancer treatment. Fell on Monday. EXAM: CHEST - 2 VIEW COMPARISON:  02/16/2021 FINDINGS: Power port type central venous catheter with tip over the cavoatrial junction region. Heart size and pulmonary vascularity are normal. Lungs are clear. No pleural effusions. No pneumothorax. Mediastinal contours appear intact. IMPRESSION: No active cardiopulmonary disease. Electronically Signed   By: Lucienne Capers M.D.   On: 12/23/2021 23:09  ? ?CT Head Wo Contrast ? ?Result Date: 12/24/2021 ?CLINICAL DATA:  Head injury on Monday with moderate to severe head trauma. Progressive facial  bruising, generalized weakness, and dizziness. EXAM: CT HEAD WITHOUT CONTRAST TECHNIQUE: Contiguous axial images were obtained from the base of the skull through the vertex without intravenous contrast. RADIATION DOSE RED

## 2021-12-24 ENCOUNTER — Emergency Department (HOSPITAL_COMMUNITY): Payer: BC Managed Care – PPO

## 2021-12-24 ENCOUNTER — Telehealth: Payer: Self-pay

## 2021-12-24 LAB — URINALYSIS, ROUTINE W REFLEX MICROSCOPIC
Bilirubin Urine: NEGATIVE
Glucose, UA: NEGATIVE mg/dL
Ketones, ur: NEGATIVE mg/dL
Nitrite: NEGATIVE
Specific Gravity, Urine: <1.005 — ABNORMAL LOW (ref 1.005–1.030)
pH: 6 (ref 5.0–8.0)

## 2021-12-24 LAB — RESP PANEL BY RT-PCR (FLU A&B, COVID) ARPGX2
Influenza A by PCR: NEGATIVE
Influenza B by PCR: NEGATIVE
SARS Coronavirus 2 by RT PCR: NEGATIVE

## 2021-12-24 LAB — TROPONIN I (HIGH SENSITIVITY): Troponin I (High Sensitivity): 83 ng/L — ABNORMAL HIGH (ref <18)

## 2021-12-24 LAB — URINALYSIS, MICROSCOPIC (REFLEX)

## 2021-12-24 LAB — LACTIC ACID, PLASMA: Lactic Acid, Venous: 1.4 mmol/L (ref 0.5–1.9)

## 2021-12-24 MED ORDER — SODIUM CHLORIDE 0.9 % IV SOLN
3.0000 g | Freq: Once | INTRAVENOUS | Status: AC
  Administered 2021-12-24: 3 g via INTRAVENOUS
  Filled 2021-12-24: qty 8

## 2021-12-24 MED ORDER — POTASSIUM CHLORIDE 20 MEQ PO PACK
40.0000 meq | PACK | Freq: Once | ORAL | Status: AC
  Administered 2021-12-24: 40 meq via ORAL
  Filled 2021-12-24: qty 2

## 2021-12-24 MED ORDER — POTASSIUM CHLORIDE CRYS ER 20 MEQ PO TBCR
40.0000 meq | EXTENDED_RELEASE_TABLET | Freq: Once | ORAL | Status: DC
Start: 2021-12-24 — End: 2021-12-24
  Filled 2021-12-24: qty 2

## 2021-12-24 NOTE — Telephone Encounter (Signed)
Called and left a message asking her to call the office back. Calling to see how she is doing after going to the ED. ?

## 2021-12-24 NOTE — ED Notes (Signed)
Urine culture added on 

## 2021-12-24 NOTE — Discharge Instructions (Addendum)
Your work-up today has been notable for some abnormalities however outpatient follow-up is a reasonable next step.  I recommend that you call your oncologist tomorrow morning to touch base with them. ? ?You have had some changes in your blood count panel lab which need to be further reviewed by your oncologist.  However after talking to Dr. Earlie Server it seems that this can be done outpatient. ? ? ?Your potassium was noted to be somewhat low I have repleted that here.  I recommend that you have your basic labs rechecked within the next few days/1 week to make sure that this improves. ? ?If you develop any chest pain please return immediately to the emergency room also if you do develop any shortness of breath, persistent nausea with chest pain or any other new or concerning symptoms please return to the ER.  Please continue taking the Augmentin for the entire course until complete. ? ? ?Please also start taking the metoprolol and follow-up with cardiology.  ?

## 2021-12-25 ENCOUNTER — Encounter: Payer: Self-pay | Admitting: Hematology and Oncology

## 2021-12-25 LAB — URINE CULTURE: Culture: NO GROWTH

## 2021-12-28 ENCOUNTER — Inpatient Hospital Stay: Payer: BC Managed Care – PPO

## 2021-12-28 ENCOUNTER — Other Ambulatory Visit: Payer: Self-pay

## 2021-12-28 ENCOUNTER — Encounter: Payer: Self-pay | Admitting: Hematology and Oncology

## 2021-12-28 ENCOUNTER — Inpatient Hospital Stay: Payer: BC Managed Care – PPO | Admitting: Hematology and Oncology

## 2021-12-28 VITALS — HR 98

## 2021-12-28 DIAGNOSIS — J0191 Acute recurrent sinusitis, unspecified: Secondary | ICD-10-CM

## 2021-12-28 DIAGNOSIS — T451X5A Adverse effect of antineoplastic and immunosuppressive drugs, initial encounter: Secondary | ICD-10-CM

## 2021-12-28 DIAGNOSIS — I427 Cardiomyopathy due to drug and external agent: Secondary | ICD-10-CM | POA: Diagnosis not present

## 2021-12-28 DIAGNOSIS — C55 Malignant neoplasm of uterus, part unspecified: Secondary | ICD-10-CM

## 2021-12-28 DIAGNOSIS — C7801 Secondary malignant neoplasm of right lung: Secondary | ICD-10-CM

## 2021-12-28 DIAGNOSIS — D61818 Other pancytopenia: Secondary | ICD-10-CM

## 2021-12-28 DIAGNOSIS — Z5111 Encounter for antineoplastic chemotherapy: Secondary | ICD-10-CM | POA: Diagnosis not present

## 2021-12-28 DIAGNOSIS — K1231 Oral mucositis (ulcerative) due to antineoplastic therapy: Secondary | ICD-10-CM

## 2021-12-28 LAB — CBC WITH DIFFERENTIAL (CANCER CENTER ONLY)
Abs Immature Granulocytes: 1.17 10*3/uL — ABNORMAL HIGH (ref 0.00–0.07)
Basophils Absolute: 0.1 10*3/uL (ref 0.0–0.1)
Basophils Relative: 1 %
Eosinophils Absolute: 0 10*3/uL (ref 0.0–0.5)
Eosinophils Relative: 0 %
HCT: 28.2 % — ABNORMAL LOW (ref 36.0–46.0)
Hemoglobin: 9.2 g/dL — ABNORMAL LOW (ref 12.0–15.0)
Immature Granulocytes: 9 %
Lymphocytes Relative: 12 %
Lymphs Abs: 1.5 10*3/uL (ref 0.7–4.0)
MCH: 29.9 pg (ref 26.0–34.0)
MCHC: 32.6 g/dL (ref 30.0–36.0)
MCV: 91.6 fL (ref 80.0–100.0)
Monocytes Absolute: 1 10*3/uL (ref 0.1–1.0)
Monocytes Relative: 8 %
Neutro Abs: 9.1 10*3/uL — ABNORMAL HIGH (ref 1.7–7.7)
Neutrophils Relative %: 70 %
Platelet Count: 279 10*3/uL (ref 150–400)
RBC: 3.08 MIL/uL — ABNORMAL LOW (ref 3.87–5.11)
RDW: 14.6 % (ref 11.5–15.5)
WBC Count: 12.9 10*3/uL — ABNORMAL HIGH (ref 4.0–10.5)
nRBC: 0.8 % — ABNORMAL HIGH (ref 0.0–0.2)

## 2021-12-28 LAB — CMP (CANCER CENTER ONLY)
ALT: 30 U/L (ref 0–44)
AST: 18 U/L (ref 15–41)
Albumin: 3.6 g/dL (ref 3.5–5.0)
Alkaline Phosphatase: 120 U/L (ref 38–126)
Anion gap: 6 (ref 5–15)
BUN: 11 mg/dL (ref 6–20)
CO2: 29 mmol/L (ref 22–32)
Calcium: 9 mg/dL (ref 8.9–10.3)
Chloride: 104 mmol/L (ref 98–111)
Creatinine: 0.61 mg/dL (ref 0.44–1.00)
GFR, Estimated: >60 mL/min (ref >60)
Glucose, Bld: 138 mg/dL — ABNORMAL HIGH (ref 70–99)
Potassium: 3.3 mmol/L — ABNORMAL LOW (ref 3.5–5.1)
Sodium: 139 mmol/L (ref 135–145)
Total Bilirubin: 0.3 mg/dL (ref 0.3–1.2)
Total Protein: 6.5 g/dL (ref 6.5–8.1)

## 2021-12-28 LAB — URINE CULTURE: Culture: >=100000 — AB

## 2021-12-28 MED ORDER — SODIUM CHLORIDE 0.9% FLUSH
10.0000 mL | Freq: Once | INTRAVENOUS | Status: AC
  Administered 2021-12-28: 10 mL

## 2021-12-28 MED ORDER — HEPARIN SOD (PORK) LOCK FLUSH 100 UNIT/ML IV SOLN
500.0000 [IU] | Freq: Once | INTRAVENOUS | Status: AC | PRN
  Administered 2021-12-28: 500 [IU]

## 2021-12-28 MED ORDER — SODIUM CHLORIDE 0.9 % IV SOLN
720.0000 mg/m2 | Freq: Once | INTRAVENOUS | Status: AC
  Administered 2021-12-28: 1178 mg via INTRAVENOUS
  Filled 2021-12-28: qty 30.98

## 2021-12-28 MED ORDER — SODIUM CHLORIDE 0.9% FLUSH
10.0000 mL | INTRAVENOUS | Status: DC | PRN
  Administered 2021-12-28: 10 mL

## 2021-12-28 MED ORDER — SODIUM CHLORIDE 0.9 % IV SOLN: Freq: Once | INTRAVENOUS | Status: AC

## 2021-12-28 MED ORDER — PROCHLORPERAZINE MALEATE 10 MG PO TABS: 10.0000 mg | ORAL_TABLET | Freq: Once | ORAL | Status: DC

## 2021-12-28 NOTE — Patient Instructions (Signed)
[facility]   ?Discharge Instructions: ?Thank you for choosing [facility] to provide your oncology and hematology care.  ? ?If you have a lab appointment with the Cancer Center, please go directly to the Cancer Center and check in at the registration area. ?  ?Wear comfortable clothing and clothing appropriate for easy access to any Portacath or PICC line.  ? ?We strive to give you quality time with your provider. You may need to reschedule your appointment if you arrive late (15 or more minutes).  Arriving late affects you and other patients whose appointments are after yours.  Also, if you miss three or more appointments without notifying the office, you may be dismissed from the clinic at the provider?s discretion.    ?  ?For prescription refill requests, have your pharmacy contact our office and allow 72 hours for refills to be completed.   ? ?Today you received the following chemotherapy and/or immunotherapy agents: gemcitabine    ?  ?To help prevent nausea and vomiting after your treatment, we encourage you to take your nausea medication as directed. ? ?BELOW ARE SYMPTOMS THAT SHOULD BE REPORTED IMMEDIATELY: ?*FEVER GREATER THAN 100.4 F (38 ?C) OR HIGHER ?*CHILLS OR SWEATING ?*NAUSEA AND VOMITING THAT IS NOT CONTROLLED WITH YOUR NAUSEA MEDICATION ?*UNUSUAL SHORTNESS OF BREATH ?*UNUSUAL BRUISING OR BLEEDING ?*URINARY PROBLEMS (pain or burning when urinating, or frequent urination) ?*BOWEL PROBLEMS (unusual diarrhea, constipation, pain near the anus) ?TENDERNESS IN MOUTH AND THROAT WITH OR WITHOUT PRESENCE OF ULCERS (sore throat, sores in mouth, or a toothache) ?UNUSUAL RASH, SWELLING OR PAIN  ?UNUSUAL VAGINAL DISCHARGE OR ITCHING  ? ?Items with * indicate a potential emergency and should be followed up as soon as possible or go to the Emergency Department if any problems should occur. ? ?Please show the CHEMOTHERAPY ALERT CARD or IMMUNOTHERAPY ALERT CARD at check-in  to the Emergency Department and triage nurse. ? ?Should you have questions after your visit or need to cancel or reschedule your appointment, please contact [facility]  Dept: [phone]  and follow the prompts.  Office hours are 8:00 a.m. to 4:30 p.m. Monday - Friday. Please note that voicemails left after 4:00 p.m. may not be returned until the following business day.  We are closed weekends and major holidays. You have access to a nurse at all times for urgent questions. Please call the main number to the clinic Dept: [phone] and follow the prompts. ? ? ?For any non-urgent questions, you may also contact your provider using MyChart. We now offer e-Visits for anyone 18 and older to request care online for non-urgent symptoms. For details visit [email or web address]. ?  ?Also download the MyChart app! Go to the app store, search "MyChart", open the app, select [facility], and log in with your MyChart username and password. ? ?Due to Covid, a mask is required upon entering the hospital/clinic. If you do not have a mask, one will be given to you upon arrival. For doctor visits, patients may have 1 support person aged 18 or older with them. For treatment visits, patients cannot have anyone with them due to current Covid guidelines and our immunocompromised population.  ? ?

## 2021-12-28 NOTE — Progress Notes (Signed)
[facility] ?OFFICE PROGRESS NOTE ? ?Patient Care Team: ?Curlene Labrum, MD as PCP - General (Family Medicine) ?[name], RN as Oncology Nurse Navigator (Oncology) ? 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 ?Acute sinus infection ?She is responding to treatment ?She will complete her course of antibiotics ? ?Chemotherapy induced cardiomyopathy ([place]) ?I have reviewed recommendation from cardiologist ?Unfortunately, she developed syncopal episode with recent blood pressure medications ?I recommend the patient to continue on beta-blocker but hold off Cozaar until her next visit ? ?Pancytopenia, acquired ([place]) ?Her pancytopenia has improved ?As above, I plan dose reduction ?She does not need transfusion support today ? ?Mucositis due to antineoplastic therapy ?Mucositis is improving ?She will continue Magic mouthwash as needed and also baking soda mixed with salt water gargle ? ?No orders of the defined types were placed in this encounter. ? ? ?All questions were answered. The patient knows to call the clinic with any problems, questions or concerns. ?The total time spent in the appointment was 40 minutes encounter with patients including review of chart and various tests results, discussions about plan of care and coordination of care plan ?  ?[name], MD ?12/28/2021 1:40 PM ? ?INTERVAL HISTORY: ?Please see below for problem oriented charting. 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 ?REVIEW OF SYSTEMS:   ?Constitutional: Denies fevers, chills or abnormal weight loss ?Eyes: Denies blurriness of vision ?Respiratory: Denies cough, dyspnea or wheezes ?Cardiovascular: Denies palpitation, chest discomfort or lower extremity swelling ?Gastrointestinal:  Denies nausea, heartburn or change in bowel habits ?Lymphatics: Denies new lymphadenopathy or easy bruising ?Behavioral/Psych: Mood is stable, no new changes  ?All other systems were reviewed with the patient and are negative. ? ?I have reviewed the past medical history, past surgical history, social history and family history with the patient and they are unchanged from previous note. ? ?ALLERGIES:  is allergic to doxycycline. ? ?MEDICATIONS:  ?Current Outpatient Medications  ?Medication Sig Dispense Refill  ? acetaminophen (TYLENOL) 500 MG tablet Take 1,000 mg by mouth every 6 (six) hours as needed for moderate pain or headache.    ? amoxicillin-clavulanate (AUGMENTIN) 600-42.9 MG/5ML suspension Take 5 mLs (600 mg total) by mouth 2 (two) times daily for 7 days. 70 mL 0  ? bisacodyl 5 MG EC tablet Take 1 tablet (5 mg total) by mouth daily as needed for moderate constipation. 30 tablet 3  ? dexamethasone (DECADRON) 4 MG tablet Take 2 tablets (8 mg total) by mouth daily. Start the day before Taxotere. Then daily after chemo for 2 days. 30 tablet 1  ? lidocaine-prilocaine (EMLA) cream Apply to affected area once 30 g 3  ? loratadine (CLARITIN) 10  MG tablet Take 10 mg by mouth daily as needed (for bone aches).    ? LORazepam (ATIVAN) 0.5 MG tablet Take 1 tablet (0.5 mg total) by mouth 2 (two) times daily as needed for  anxiety. (Patient not taking: Reported on 12/24/2021) 30 tablet 0  ? magic mouthwash w/lidocaine SOLN Take 5 mLs by mouth 4 (four) times daily for 7 days. Swish and swallow 5 mls 4 x a day for 7 days. 240 mL 0  ? metoprolol succinate (TOPROL XL) 25 MG 24 hr tablet Take 1 tablet (25 mg total) by mouth at bedtime. (Patient not taking: Reported on 12/24/2021) 30 tablet 11  ? ondansetron (ZOFRAN) 8 MG tablet Take 1 tablet (8 mg total) by mouth every 8 (eight) hours as needed. (Patient not taking: Reported on 12/24/2021) 30 tablet 1  ? oxyCODONE (OXY IR/ROXICODONE) 5 MG immediate release tablet Take 1 tablet (5 mg total) by mouth every 4 (four) hours as needed for severe pain. 30 tablet 0  ? prochlorperazine (COMPAZINE) 10 MG tablet Take 1 tablet (10 mg total) by mouth every 6 (six) hours as needed (Nausea or vomiting). 90 tablet 1  ? ?No current facility-administered medications for this visit.  ? ?Facility-Administered Medications Ordered in Other Visits  ?Medication Dose Route Frequency Provider Last Rate Last Admin  ? gemcitabine (GEMZAR) 1,178 mg in sodium chloride 0.9 % 250 mL chemo infusion  720 mg/m2 (Treatment Plan Recorded) Intravenous Once [name], MD 187 mL/hr at 12/28/21 1256 1,178 mg at 12/28/21 1256  ? heparin lock flush 100 unit/mL  500 Units Intracatheter Once PRN [name], MD      ? prochlorperazine (COMPAZINE) tablet 10 mg  10 mg Oral Once Alvy Bimler, Sebastiana Wuest, MD      ? sodium chloride flush (NS) 0.9 % injection 10 mL  10 mL Intracatheter PRN [name], MD      ? ? 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 1. 9.5 x 7.6 x 9.0 cm complex, partially necrotic, mass involving the lower uterine segment/ cervix. No obvious direct extension into the parametrium.  ?2. 9 mm left pelvic sidewall lymph node is partially necrotic and worrisome for metastatic adenopathy.  ?3. No findings for abdominal omental or peritoneal surface disease or adenopathy.  ?4.  Tiny low-attenuation lesion in the pancreatic head, likely benign cyst but attention on follow-up scans is suggested.  ?5. 2.9 cm fundal fibroid.  ?  ?06/19/2021 Pathology Results  ? FINAL MICROSCOPIC DIAGNOSIS:  ? ?A. 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  ?- See comment.  ? ?COMMENT:  ?The biopsies consist of endocervical mucosa with stromal edema and one biopsy fragment has a microscopic focus with atypical spindle cells consistent with poorly differentiated malignancy.  The differential  ?includes a spindle cell malignancy such as sarcomatoid carcinoma and leiomyosarcoma.  Mullerian adenosarcoma is also a consideration but considered less likely ?  ?06/23/2021 Imaging  ? MR pelvis ? ?10 cm uterine mass with central necrosis, which is centered in the cervix and lower uterine segment. Right parametrial involvement is seen as well as suspected invasion of the distal rectum. Differential diagnosis includes cervical carcinoma and uterine leiomyosarcoma. ?  ?Mild bilateral iliac lymphadenopathy, highly suspicious for metastatic disease. ?  ?2.9 cm subserosal fibroid in the posterior fundus. ?  ?Normal appearance of both ovaries. ?  ?  ?06/29/2021 PET scan  ? 1. Hypermetabolic necrotic cervical/uterine mass with bilateral external iliac hypermetabolic lymph  nodes. No evidence of distant metastatic disease. ?2. 1.5 cm low-attenuation left thyroid nodule. Recommend thyroid ultrasound. (Ref: J Am Coll Radiol. 2015 Feb;12(2): 143-50). ?  ?07/17/2021 Pathology Results  ? A: Uterus with cervix and bilateral ovaries and fallopian tubes, radical hysterectomy and bilateral salpingo-oophorectomy ?- Leiomyosarcoma, high grade (grade 3 / 3) with extensive epithelioid, pleomorphic, and myxoid areas and associated necrosis (~20%) ?- Tumor based in cervix and also involves lower uterine segment ?- Cervicovaginal margin involved by focal invasive leiomyosarcoma (3:00-5:00, A10) as well as tumor in lymphovascular  spaces ?- Leiomyosarcoma involves right and left parametrial tissue and extends to parametrial margins ?- Extensive lymphovascular space invasion present, including in uterus and parametria ?- See synoptic report

## 2021-12-28 NOTE — Assessment & Plan Note (Signed)
She is responding to treatment ?She will complete her course of antibiotics ?

## 2021-12-28 NOTE — Assessment & Plan Note (Signed)
I have reviewed recommendation from cardiologist ?Unfortunately, she developed syncopal episode with recent blood pressure medications ?I recommend the patient to continue on beta-blocker but hold off Cozaar until her next visit ?

## 2021-12-28 NOTE — Assessment & Plan Note (Signed)
She had recent multiple complications related to side effects of treatment ?We will continue treatment as scheduled but I plan dose reduction due to severe pancytopenia and mucositis ?She is in agreement with the plan of care ?I recommend minimum 3 cycles of treatment before repeat imaging study ?

## 2021-12-28 NOTE — Assessment & Plan Note (Signed)
Her pancytopenia has improved ?As above, I plan dose reduction ?She does not need transfusion support today ?

## 2021-12-28 NOTE — Assessment & Plan Note (Signed)
Mucositis is improving ?She will continue Magic mouthwash as needed and also baking soda mixed with salt water gargle ?

## 2021-12-29 LAB — CULTURE, BLOOD (ROUTINE X 2)
Culture: NO GROWTH
Culture: NO GROWTH
Special Requests: ADEQUATE
Special Requests: ADEQUATE

## 2021-12-29 LAB — MISCELLANEOUS TEST

## 2022-01-04 ENCOUNTER — Inpatient Hospital Stay: Payer: BC Managed Care – PPO

## 2022-01-04 ENCOUNTER — Inpatient Hospital Stay: Payer: BC Managed Care – PPO | Admitting: Hematology and Oncology

## 2022-01-04 ENCOUNTER — Other Ambulatory Visit (HOSPITAL_COMMUNITY): Payer: Self-pay

## 2022-01-04 ENCOUNTER — Other Ambulatory Visit: Payer: Self-pay

## 2022-01-04 ENCOUNTER — Encounter: Payer: Self-pay | Admitting: Hematology and Oncology

## 2022-01-04 VITALS — BP 128/90 | HR 109 | Resp 18 | Ht 68.0 in | Wt 123.2 lb

## 2022-01-04 VITALS — BP 128/88 | HR 98 | Resp 18

## 2022-01-04 DIAGNOSIS — K1231 Oral mucositis (ulcerative) due to antineoplastic therapy: Secondary | ICD-10-CM

## 2022-01-04 DIAGNOSIS — C55 Malignant neoplasm of uterus, part unspecified: Secondary | ICD-10-CM

## 2022-01-04 DIAGNOSIS — N390 Urinary tract infection, site not specified: Secondary | ICD-10-CM | POA: Diagnosis not present

## 2022-01-04 DIAGNOSIS — C7802 Secondary malignant neoplasm of left lung: Secondary | ICD-10-CM

## 2022-01-04 DIAGNOSIS — Z5111 Encounter for antineoplastic chemotherapy: Secondary | ICD-10-CM | POA: Diagnosis not present

## 2022-01-04 DIAGNOSIS — D61818 Other pancytopenia: Secondary | ICD-10-CM | POA: Diagnosis not present

## 2022-01-04 LAB — CBC WITH DIFFERENTIAL (CANCER CENTER ONLY)
Abs Immature Granulocytes: 0.01 10*3/uL (ref 0.00–0.07)
Basophils Absolute: 0 10*3/uL (ref 0.0–0.1)
Basophils Relative: 0 %
Eosinophils Absolute: 0 10*3/uL (ref 0.0–0.5)
Eosinophils Relative: 0 %
HCT: 24.7 % — ABNORMAL LOW (ref 36.0–46.0)
Hemoglobin: 8.2 g/dL — ABNORMAL LOW (ref 12.0–15.0)
Immature Granulocytes: 0 %
Lymphocytes Relative: 12 %
Lymphs Abs: 0.4 10*3/uL — ABNORMAL LOW (ref 0.7–4.0)
MCH: 30 pg (ref 26.0–34.0)
MCHC: 33.2 g/dL (ref 30.0–36.0)
MCV: 90.5 fL (ref 80.0–100.0)
Monocytes Absolute: 0.1 10*3/uL (ref 0.1–1.0)
Monocytes Relative: 3 %
Neutro Abs: 2.7 10*3/uL (ref 1.7–7.7)
Neutrophils Relative %: 85 %
Platelet Count: 323 10*3/uL (ref 150–400)
RBC: 2.73 MIL/uL — ABNORMAL LOW (ref 3.87–5.11)
RDW: 13.6 % (ref 11.5–15.5)
WBC Count: 3.2 10*3/uL — ABNORMAL LOW (ref 4.0–10.5)
nRBC: 0 % (ref 0.0–0.2)

## 2022-01-04 LAB — CMP (CANCER CENTER ONLY)
ALT: 33 U/L (ref 0–44)
AST: 20 U/L (ref 15–41)
Albumin: 3.7 g/dL (ref 3.5–5.0)
Alkaline Phosphatase: 96 U/L (ref 38–126)
Anion gap: 7 (ref 5–15)
BUN: 11 mg/dL (ref 6–20)
CO2: 26 mmol/L (ref 22–32)
Calcium: 8.9 mg/dL (ref 8.9–10.3)
Chloride: 106 mmol/L (ref 98–111)
Creatinine: 0.62 mg/dL (ref 0.44–1.00)
GFR, Estimated: >60 mL/min (ref >60)
Glucose, Bld: 160 mg/dL — ABNORMAL HIGH (ref 70–99)
Potassium: 3.6 mmol/L (ref 3.5–5.1)
Sodium: 139 mmol/L (ref 135–145)
Total Bilirubin: 0.3 mg/dL (ref 0.3–1.2)
Total Protein: 6.5 g/dL (ref 6.5–8.1)

## 2022-01-04 LAB — URINALYSIS, COMPLETE (UACMP) WITH MICROSCOPIC
Bilirubin Urine: NEGATIVE
Glucose, UA: NEGATIVE mg/dL
Hgb urine dipstick: NEGATIVE
Ketones, ur: 5 mg/dL — AB
Leukocytes,Ua: NEGATIVE
Nitrite: NEGATIVE
Protein, ur: 100 mg/dL — AB
Specific Gravity, Urine: 1.031 — ABNORMAL HIGH (ref 1.005–1.030)
pH: 5 (ref 5.0–8.0)

## 2022-01-04 MED ORDER — SODIUM CHLORIDE 0.9 % IV SOLN: Freq: Once | INTRAVENOUS | Status: AC

## 2022-01-04 MED ORDER — SODIUM CHLORIDE 0.9 % IV SOLN
720.0000 mg/m2 | Freq: Once | INTRAVENOUS | Status: AC
  Administered 2022-01-04: 1178 mg via INTRAVENOUS
  Filled 2022-01-04: qty 30.98

## 2022-01-04 MED ORDER — MAGIC MOUTHWASH W/LIDOCAINE: 5.0000 mL | Freq: Four times a day (QID) | ORAL | 0 refills | Status: DC

## 2022-01-04 MED ORDER — SODIUM CHLORIDE 0.9% FLUSH
10.0000 mL | Freq: Once | INTRAVENOUS | Status: AC
  Administered 2022-01-04: 10 mL

## 2022-01-04 MED ORDER — SODIUM CHLORIDE 0.9 % IV SOLN
80.0000 mg/m2 | Freq: Once | INTRAVENOUS | Status: AC
  Administered 2022-01-04: 130 mg via INTRAVENOUS
  Filled 2022-01-04: qty 13

## 2022-01-04 MED ORDER — SODIUM CHLORIDE 0.9 % IV SOLN
10.0000 mg | Freq: Once | INTRAVENOUS | Status: AC
  Administered 2022-01-04: 10 mg via INTRAVENOUS
  Filled 2022-01-04: qty 10

## 2022-01-04 MED ORDER — NYSTATIN 100000 UNIT/ML MT SUSP
OROMUCOSAL | 0 refills | Status: DC
  Filled 2022-01-04: qty 240, 12d supply, fill #0

## 2022-01-04 NOTE — Patient Instructions (Addendum)
[facility]  Discharge Instructions: ?Thank you for choosing [facility] to provide your oncology and hematology care.  ? ?If you have a lab appointment with the [facility], please go directly to the [facility] and check in at the registration area. ?  ?Wear comfortable clothing and clothing appropriate for easy access to any Portacath or PICC line.  ? ?We strive to give you quality time with your provider. You may need to reschedule your appointment if you arrive late (15 or more minutes).  Arriving late affects you and other patients whose appointments are after yours.  Also, if you miss three or more appointments without notifying the office, you may be dismissed from the clinic at the provider?s discretion.    ?  ?For prescription refill requests, have your pharmacy contact our office and allow 72 hours for refills to be completed.   ? ?Today you received the following chemotherapy and/or immunotherapy agents: gemcitabine & taxotere ?  ?To help prevent nausea and vomiting after your treatment, we encourage you to take your nausea medication as directed. ? ?BELOW ARE SYMPTOMS THAT SHOULD BE REPORTED IMMEDIATELY: ?*FEVER GREATER THAN 100.4 F (38 ?C) OR HIGHER ?*CHILLS OR SWEATING ?*NAUSEA AND VOMITING THAT IS NOT CONTROLLED WITH YOUR NAUSEA MEDICATION ?*UNUSUAL SHORTNESS OF BREATH ?*UNUSUAL BRUISING OR BLEEDING ?*URINARY PROBLEMS (pain or burning when urinating, or frequent urination) ?*BOWEL PROBLEMS (unusual diarrhea, constipation, pain near the anus) ?TENDERNESS IN MOUTH AND THROAT WITH OR WITHOUT PRESENCE OF ULCERS (sore throat, sores in mouth, or a toothache) ?UNUSUAL RASH, SWELLING OR PAIN  ?UNUSUAL VAGINAL DISCHARGE OR ITCHING  ? ?Items with * indicate a potential emergency and should be followed up as soon as possible or go to the Emergency Department if any problems should occur. ? ?Please show the CHEMOTHERAPY ALERT CARD or IMMUNOTHERAPY ALERT CARD at  check-in to the Emergency Department and triage nurse. ? ?Should you have questions after your visit or need to cancel or reschedule your appointment, please contact [facility]  Dept: [phone]  and follow the prompts.  Office hours are 8:00 a.m. to 4:30 p.m. Monday - Friday. Please note that voicemails left after 4:00 p.m. may not be returned until the following business day.  We are closed weekends and major holidays. You have access to a nurse at all times for urgent questions. Please call the main number to the clinic Dept: [phone] and follow the prompts. ? ? ?For any non-urgent questions, you may also contact your provider using MyChart. We now offer e-Visits for anyone 66 and older to request care online for non-urgent symptoms. For details visit [email or web address]. ?  ?Also download the MyChart app! Go to the app store, search "MyChart", open the app, select [facility], and log in with your MyChart username and password. ? ?Due to Covid, a mask is required upon entering the hospital/clinic. If you do not have a mask, one will be given to you upon arrival. For doctor visits, patients may have 1 support Liandro Thelin aged 56 or older with them. For treatment visits, patients cannot have anyone with them due to current Covid guidelines and our immunocompromised population.  ? ?

## 2022-01-05 ENCOUNTER — Encounter: Payer: Self-pay | Admitting: Hematology and Oncology

## 2022-01-05 LAB — URINE CULTURE: Culture: <10000 — AB

## 2022-01-05 NOTE — Assessment & Plan Note (Signed)
Mucositis is improving ?She will continue Magic mouthwash as needed and also baking soda mixed with salt water gargle ?

## 2022-01-05 NOTE — Progress Notes (Signed)
[facility] ?OFFICE PROGRESS NOTE ? ?Patient Care Team: ?Curlene Labrum, MD as PCP - General (Family Medicine) ?[name], RN as Oncology Nurse Navigator (Oncology) ? 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 ?Pancytopenia, acquired ([place]) ?We will proceed with treatment without delay ?It is possible she will need blood transfusion support ?She will have additional blood work monitoring this week and next week and we will give her blood if hemoglobin is 8 or less ? ?UTI (urinary tract infection) ?She had history of recurrent UTI ?I will repeat urinalysis and urine culture per patient request due to symptomatic dysuria ? ?Mucositis due to antineoplastic therapy ?Mucositis is improving ?She will continue Magic mouthwash as needed and also baking soda mixed with salt water gargle ? ?Orders Placed This Encounter  ?Procedures  ? Urine Culture  ?  Standing Status:   Future  ?  Number of Occurrences:   1  ?  Standing Expiration Date:   01/05/2023  ? Urinalysis, Complete w Microscopic  ?  Standing Status:   Future  ?  Number of Occurrences:   1  ?  Standing Expiration Date:   01/05/2023  ? ABO/Rh  ?  Standing Status:   Future  ?  Standing Expiration Date:   01/05/2023  ? Sample to Blood Bank  ?  Standing Status:   Standing  ?  Number of Occurrences:   67  ?  Standing Expiration Date:   01/05/2023  ? ? ?All questions were answered. The patient knows to call the clinic with any problems, questions or concerns. ?The total time spent in the appointment was 30 minutes encounter with patients  including review of chart and various tests results, discussions about plan of care and coordination of care plan ?  ?[name], MD ?01/05/2022 1:05 PM ? ?INTERVAL HISTORY: ?Please see below for problem oriented charting. ?she returns for treatment follow-up seen prior to chemotherapy ?She complained of persistent mucositis ?She has some dysuria on urination ?Overall, she felt a bit stronger ?No dizziness or lightheadedness ?No recent bleeding ?Denies recent nausea or changes in bowel habits ? ?REVIEW OF SYSTEMS:   ?Constitutional: Denies fevers, chills or abnormal weight loss ?Eyes: Denies blurriness of vision ?Respiratory: Denies cough, dyspnea or wheezes ?Cardiovascular: Denies palpitation, chest discomfort or lower extremity swelling ?Gastrointestinal:  Denies nausea, heartburn or change in bowel habits ?Skin: Denies abnormal skin rashes ?Lymphatics: Denies new lymphadenopathy or easy bruising ?Neurological:Denies numbness, tingling or new weaknesses ?Behavioral/Psych: Mood is stable, no new changes  ?All other systems were reviewed with the patient and are negative. ? ?I have reviewed the past medical history, past surgical history, social history and family history with the patient and they are unchanged from previous note. ? ?ALLERGIES:  is allergic to doxycycline. ? ?MEDICATIONS:  ?Current Outpatient Medications  ?Medication Sig Dispense Refill  ? acetaminophen (TYLENOL) 500 MG tablet Take 1,000 mg by mouth every 6 (six) hours as needed for moderate pain or headache.    ? bisacodyl 5 MG EC tablet Take 1 tablet (5 mg total) by mouth daily as needed  for moderate constipation. 30 tablet 3  ? dexamethasone (DECADRON) 4 MG tablet Take 2 tablets (8 mg total) by mouth daily. Start the day before Taxotere. Then daily after chemo for 2 days. 30 tablet 1  ? lidocaine-prilocaine (EMLA) cream Apply to affected area once 30 g 3  ? loratadine (CLARITIN) 10 MG tablet Take 10 mg by mouth daily as needed (for bone aches).     ? LORazepam (ATIVAN) 0.5 MG tablet Take 1 tablet (0.5 mg total) by mouth 2 (two) times daily as needed for anxiety. (Patient not taking: Reported on 12/24/2021) 30 tablet 0  ? magic mouthwash (nystatin, lidocaine, diphenhydrAMINE, alum & mag hydroxide) suspension Swish and spit 5 mL 4 times daily. 240 mL 0  ? magic mouthwash w/lidocaine SOLN Take 5 mLs by mouth 4 (four) times daily. Take 5 mls by mouth 4 times a day, swish and spit. 240 mL 0  ? metoprolol succinate (TOPROL XL) 25 MG 24 hr tablet Take 1 tablet (25 mg total) by mouth at bedtime. (Patient not taking: Reported on 12/24/2021) 30 tablet 11  ? ondansetron (ZOFRAN) 8 MG tablet Take 1 tablet (8 mg total) by mouth every 8 (eight) hours as needed. (Patient not taking: Reported on 12/24/2021) 30 tablet 1  ? oxyCODONE (OXY IR/ROXICODONE) 5 MG immediate release tablet Take 1 tablet (5 mg total) by mouth every 4 (four) hours as needed for severe pain. 30 tablet 0  ? prochlorperazine (COMPAZINE) 10 MG tablet Take 1 tablet (10 mg total) by mouth every 6 (six) hours as needed (Nausea or vomiting). 90 tablet 1  ? ?No current facility-administered medications for this visit.  ? ? 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 1. 9.5 x 7.6 x 9.0 cm complex, partially necrotic, mass involving the lower uterine segment/ cervix. No obvious direct extension into the parametrium.  ?2. 9 mm left pelvic sidewall lymph node is partially necrotic and worrisome for metastatic adenopathy.  ?3. No findings for abdominal omental or peritoneal surface disease or adenopathy.  ?4. Tiny low-attenuation lesion in the pancreatic head, likely benign cyst but attention on follow-up scans is suggested.  ?5. 2.9 cm fundal fibroid.  ?  ?06/19/2021 Pathology Results  ? FINAL MICROSCOPIC DIAGNOSIS:  ? ?A. 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  ?- See comment.  ? ?COMMENT:  ?The biopsies consist of endocervical mucosa with  stromal edema and one biopsy fragment has a microscopic focus with atypical spindle cells consistent with poorly differentiated malignancy.  The differential  ?includes a spindle cell malignancy such as sarcomatoid carcinoma and leiomyosarcoma.  Mullerian adenosarcoma is also a consideration but considered less likely ?  ?06/23/2021 Imaging  ? MR pelvis ? ?10 cm uterine mass with central necrosis, which is centered in the cervix and lower uterine segment. Right parametrial involvement is seen as well as suspected invasion of the distal rectum. Differential diagnosis includes cervical carcinoma and uterine leiomyosarcoma. ?  ?Mild bilateral iliac lymphadenopathy, highly suspicious for metastatic disease. ?  ?2.9 cm subserosal fibroid in the posterior fundus. ?  ?Normal appearance of both ovaries. ?  ?  ?06/29/2021 PET scan  ? 1. Hypermetabolic necrotic cervical/uterine mass with bilateral external iliac hypermetabolic lymph nodes. No evidence of distant metastatic disease. ?2. 1.5 cm low-attenuation left thyroid nodule. Recommend thyroid ultrasound. (Ref: J Am Coll Radiol. 2015 Feb;12(2): 143-50). ?  ?07/17/2021 Pathology Results  ? A: Uterus with cervix and bilateral ovaries and fallopian  tubes, radical hysterectomy and bilateral salpingo-oophorectomy ?- Leiomyosarcoma, high grade (grade 3 / 3) with extensive epithelioid, pleomorphic, and myxoid areas and associated necrosis (~20%) ?- Tumor based in cervix and also involves lower uterine segment ?- Cervicovaginal margin involved by focal invasive leiomyosarcoma (3:00-5:00, A10) as well as tumor in lymphovascular spaces ?- Leiomyosarcoma involves right and left parametrial tissue and extends to parametrial margins ?- Extensive lymphovascular space invasion present, including in uterus and parametria ?- See synoptic report and comment ?  ?Other findings: ?- Leiomyomata with hyalinization, size up to 3.0 cm ?- Ovaries and fallopian tubes with no parenchymal involvement by  leiomyosarcoma identified, although adnexal lymphovascular space invasion is present ?  ?B: Lymph nodes, right pelvic, lymphadenectomy ?- One of four lymph nodes positive for metastatic leiomyosarcoma

## 2022-01-05 NOTE — Assessment & Plan Note (Signed)
We will proceed with treatment without delay ?It is possible she will need blood transfusion support ?She will have additional blood work monitoring this week and next week and we will give her blood if hemoglobin is 8 or less ?

## 2022-01-05 NOTE — Assessment & Plan Note (Signed)
She had history of recurrent UTI ?I will repeat urinalysis and urine culture per patient request due to symptomatic dysuria ?

## 2022-01-05 NOTE — Assessment & Plan Note (Signed)
Even though she felt better, she has significant pancytopenia from treatment ?I request for her to get CBC rechecked on Wednesday when she returns for G-CSF support ?It is likely she will need transfusion support soon ?She is symptomatic with mucositis ?We will treat that ?She has concern about UTI, we will check urinalysis and urine culture ?She needs minimum 3 cycles of chemotherapy before repeat imaging study ?

## 2022-01-06 ENCOUNTER — Inpatient Hospital Stay: Payer: BC Managed Care – PPO

## 2022-01-06 ENCOUNTER — Telehealth: Payer: Self-pay | Admitting: Oncology

## 2022-01-06 ENCOUNTER — Other Ambulatory Visit: Payer: BC Managed Care – PPO

## 2022-01-06 ENCOUNTER — Other Ambulatory Visit: Payer: Self-pay

## 2022-01-06 ENCOUNTER — Other Ambulatory Visit: Payer: Self-pay | Admitting: Hematology and Oncology

## 2022-01-06 VITALS — BP 123/74 | HR 97 | Temp 98.3°F | Resp 17

## 2022-01-06 VITALS — BP 147/82 | HR 87 | Temp 99.8°F | Resp 18

## 2022-01-06 DIAGNOSIS — D61818 Other pancytopenia: Secondary | ICD-10-CM

## 2022-01-06 DIAGNOSIS — C55 Malignant neoplasm of uterus, part unspecified: Secondary | ICD-10-CM

## 2022-01-06 DIAGNOSIS — Z5111 Encounter for antineoplastic chemotherapy: Secondary | ICD-10-CM | POA: Diagnosis not present

## 2022-01-06 LAB — CBC WITH DIFFERENTIAL (CANCER CENTER ONLY)
Abs Immature Granulocytes: 0.03 10*3/uL (ref 0.00–0.07)
Basophils Absolute: 0 10*3/uL (ref 0.0–0.1)
Basophils Relative: 0 %
Eosinophils Absolute: 0 10*3/uL (ref 0.0–0.5)
Eosinophils Relative: 0 %
HCT: 22.6 % — ABNORMAL LOW (ref 36.0–46.0)
Hemoglobin: 7.5 g/dL — ABNORMAL LOW (ref 12.0–15.0)
Immature Granulocytes: 1 %
Lymphocytes Relative: 8 %
Lymphs Abs: 0.3 10*3/uL — ABNORMAL LOW (ref 0.7–4.0)
MCH: 30.1 pg (ref 26.0–34.0)
MCHC: 33.2 g/dL (ref 30.0–36.0)
MCV: 90.8 fL (ref 80.0–100.0)
Monocytes Absolute: 0.1 10*3/uL (ref 0.1–1.0)
Monocytes Relative: 4 %
Neutro Abs: 2.8 10*3/uL (ref 1.7–7.7)
Neutrophils Relative %: 87 %
Platelet Count: 141 10*3/uL — ABNORMAL LOW (ref 150–400)
RBC: 2.49 MIL/uL — ABNORMAL LOW (ref 3.87–5.11)
RDW: 13.5 % (ref 11.5–15.5)
WBC Count: 3.2 10*3/uL — ABNORMAL LOW (ref 4.0–10.5)
nRBC: 0 % (ref 0.0–0.2)

## 2022-01-06 LAB — SAMPLE TO BLOOD BANK

## 2022-01-06 LAB — ABO/RH: ABO/RH(D): B POS

## 2022-01-06 LAB — PREPARE RBC (CROSSMATCH)

## 2022-01-06 MED ORDER — SODIUM CHLORIDE 0.9% IV SOLUTION
250.0000 mL | Freq: Once | INTRAVENOUS | Status: AC
  Administered 2022-01-06: 250 mL via INTRAVENOUS

## 2022-01-06 MED ORDER — ESTRADIOL 0.1 MG/GM VA CREA: 1.0000 | TOPICAL_CREAM | VAGINAL | 0 refills | Status: DC

## 2022-01-06 MED ORDER — ACETAMINOPHEN 325 MG PO TABS: 650.0000 mg | ORAL_TABLET | Freq: Once | ORAL | Status: DC

## 2022-01-06 MED ORDER — DIPHENHYDRAMINE HCL 25 MG PO CAPS
25.0000 mg | ORAL_CAPSULE | Freq: Once | ORAL | Status: AC
  Administered 2022-01-06: 25 mg via ORAL
  Filled 2022-01-06: qty 1

## 2022-01-06 MED ORDER — SODIUM CHLORIDE 0.9% FLUSH
10.0000 mL | Freq: Once | INTRAVENOUS | Status: AC
  Administered 2022-01-06: 10 mL

## 2022-01-06 MED ORDER — PEGFILGRASTIM-CBQV 6 MG/0.6ML ~~LOC~~ SOSY
6.0000 mg | PREFILLED_SYRINGE | Freq: Once | SUBCUTANEOUS | Status: AC
  Administered 2022-01-06: 6 mg via SUBCUTANEOUS
  Filled 2022-01-06: qty 0.6

## 2022-01-06 NOTE — Progress Notes (Signed)
Spoke with Dr. Alvy Bimler via telephone today and she would like Tammie Gilmore to get 1 unit of blood due to her Hgb of 7.5 g/dL.  She spoke with the patient about this possibility on 01/04/2021 when she saw her in clinic.  She discussed the risks and possible side effects from the blood transfusion and I also spoke with patient to confirm.  She is also received Udenyca today to support her pancytopenia. Gardiner Rhyme, RN ? ?

## 2022-01-06 NOTE — Telephone Encounter (Addendum)
Glorious Flicker that Dr. Alvy Bimler is recommending using estrace cream vaginally for atrophic vaginitis which may be causing some of her symptoms.  She is to use a finger tip amount and place vaginally 3 times a week at bedtime. Zenda verbalized understanding and would like the prescription sent to [facility] in [place]. ?

## 2022-01-06 NOTE — Telephone Encounter (Signed)
Called [facility] in [place].  They have the prescription and are working on getting it filled as soon as they can.  It is covered by her insurance. ?

## 2022-01-06 NOTE — Patient Instructions (Signed)
Blood Transfusion, Adult A blood transfusion is a procedure in which you receive blood or a type of blood cell (blood component) through an IV. You may need a blood transfusion when your blood level is low. This may result from a bleeding disorder, illness, injury, or surgery. The blood may come from a donor. You may also be able to donate blood for yourself (autologous blood donation) before a planned surgery. The blood given in a transfusion is made up of different blood components. You may receive: Red blood cells. These carry oxygen to the cells in the body. Platelets. These help your blood to clot. Plasma. This is the liquid part of your blood. It carries proteins and other substances throughout the body. White blood cells. These help you fight infections. If you have hemophilia or another clotting disorder, you may also receive other types of blood products. Tell a health care provider about: Any blood disorders you have. Any previous reactions you have had during a blood transfusion. Any allergies you have. All medicines you are taking, including vitamins, herbs, eye drops, creams, and over-the-counter medicines. Any surgeries you have had. Any medical conditions you have, including any recent fever or cold symptoms. Whether you are pregnant or may be pregnant. What are the risks? Generally, this is a safe procedure. However, problems may occur. The most common problems include: A mild allergic reaction, such as red, swollen areas of skin (hives) and itching. Fever or chills. This may be the body's response to new blood cells received. This may occur during or up to 4 hours after the transfusion. More serious problems may include: Transfusion-associated circulatory overload (TACO), or too much fluid in the lungs. This may cause breathing problems. A serious allergic reaction, such as difficulty breathing or swelling around the face and lips. Transfusion-related acute lung injury  (TRALI), which causes breathing difficulty and low oxygen in the blood. This can occur within hours of the transfusion or several days later. Iron overload. This can happen after receiving many blood transfusions over a period of time. Infection or virus being transmitted. This is rare because donated blood is carefully tested before it is given. Hemolytic transfusion reaction. This is rare. It happens when your body's defense system (immune system)tries to attack the new blood cells. Symptoms may include fever, chills, nausea, low blood pressure, and low back or chest pain. Transfusion-associated graft-versus-host disease (TAGVHD). This is rare. It happens when donated cells attack your body's healthy tissues. What happens before the procedure? Medicines Ask your health care provider about: Changing or stopping your regular medicines. This is especially important if you are taking diabetes medicines or blood thinners. Taking medicines such as aspirin and ibuprofen. These medicines can thin your blood. Do not take these medicines unless your health care provider tells you to take them. Taking over-the-counter medicines, vitamins, herbs, and supplements. General instructions Follow instructions from your health care provider about eating and drinking restrictions. You will have a blood test to determine your blood type. This is necessary to know what kind of blood your body will accept and to match it to the donor blood. If you are going to have a planned surgery, you may be able to do an autologous blood donation. This may be done in case you need to have a transfusion. You will have your temperature, blood pressure, and pulse monitored before the transfusion. If you have had an allergic reaction to a transfusion in the past, you may be given medicine to help prevent   a reaction. This medicine may be given to you by mouth (orally) or through an IV. Set aside time for the blood transfusion. This  procedure generally takes 1-4 hours to complete. What happens during the procedure?  An IV will be inserted into one of your veins. The bag of donated blood will be attached to your IV. The blood will then enter through your vein. Your temperature, blood pressure, and pulse will be monitored regularly during the transfusion. This monitoring is done to detect early signs of a transfusion reaction. Tell your nurse right away if you have any of these symptoms during the transfusion: Shortness of breath or trouble breathing. Chest or back pain. Fever or chills. Hives or itching. If you have any signs or symptoms of a reaction, your transfusion will be stopped and you may be given medicine. When the transfusion is complete, your IV will be removed. Pressure may be applied to the IV site for a few minutes. A bandage (dressing)will be applied. The procedure may vary among health care providers and hospitals. What happens after the procedure? Your temperature, blood pressure, pulse, breathing rate, and blood oxygen level will be monitored until you leave the hospital or clinic. Your blood may be tested to see how you are responding to the transfusion. You may be warmed with fluids or blankets to maintain a normal body temperature. If you receive your blood transfusion in an outpatient setting, you will be told whom to contact to report any reactions. Where to find more information For more information on blood transfusions, visit the American [employer] Cross: redcross.org Summary A blood transfusion is a procedure in which you receive blood or a type of blood cell (blood component) through an IV. The blood you receive may come from a donor or be donated by yourself (autologous blood donation) before a planned surgery. The blood given in a transfusion is made up of different blood components. You may receive red blood cells, platelets, plasma, or white blood cells depending on the condition treated. Your  temperature, blood pressure, and pulse will be monitored before, during, and after the transfusion. After the transfusion, your blood may be tested to see how your body has responded. This information is not intended to replace advice given to you by your health care provider. Make sure you discuss any questions you have with your health care provider. Document Revised: 08/02/2019 Document Reviewed: 03/22/2019 Elsevier Patient Education  2022 Elsevier Inc.  

## 2022-01-06 NOTE — Progress Notes (Signed)
Consent entered in chart on behalf of Dr Alvy Bimler. ?Discussed the procedure, risks and complications of blood transfusion including anaphylaxis and hemolytic transfusion reaction ?If she has severe back ache, dark urine, she was asked to report immediately to the hospital or for that matter with any severe symptoms, she can go to the hospital. ?She expressed understanding. ? ?Tammie Gilmore  ? ?

## 2022-01-07 ENCOUNTER — Telehealth: Payer: Self-pay | Admitting: Oncology

## 2022-01-07 LAB — BPAM RBC
Blood Product Expiration Date: 202304242359
ISSUE DATE / TIME: 202303291259
Unit Type and Rh: 7300

## 2022-01-07 LAB — TYPE AND SCREEN
ABO/RH(D): B POS
Antibody Screen: NEGATIVE
Unit division: 0

## 2022-01-07 NOTE — Telephone Encounter (Signed)
Tammie Gilmore called and had a question about magic mouthwash - if she needs to swish and spit this time instead of swish and swallow.  Advised her that it is now swish and spit.  She verbalized understanding and agreement. ? ?She also asked about her urine culture results.  Advised her that if was less than 10000 colonies/insignificant growth. She said she continues to have some urinary symptoms of frequency, dysuria and nocturia x 3 last night.  She is going to try the estrace cream tonight to see if that helps.  Advised I will call her tomorrow to check how she is feeling. ?

## 2022-01-10 ENCOUNTER — Encounter: Payer: Self-pay | Admitting: Hematology and Oncology

## 2022-01-11 ENCOUNTER — Telehealth: Payer: Self-pay

## 2022-01-11 ENCOUNTER — Other Ambulatory Visit: Payer: Self-pay

## 2022-01-11 ENCOUNTER — Inpatient Hospital Stay: Payer: BC Managed Care – PPO | Attending: Gynecologic Oncology

## 2022-01-11 ENCOUNTER — Encounter: Payer: Self-pay | Admitting: Hematology and Oncology

## 2022-01-11 ENCOUNTER — Inpatient Hospital Stay: Payer: BC Managed Care – PPO

## 2022-01-11 DIAGNOSIS — Z5189 Encounter for other specified aftercare: Secondary | ICD-10-CM | POA: Insufficient documentation

## 2022-01-11 DIAGNOSIS — Z5111 Encounter for antineoplastic chemotherapy: Secondary | ICD-10-CM | POA: Insufficient documentation

## 2022-01-11 DIAGNOSIS — C7801 Secondary malignant neoplasm of right lung: Secondary | ICD-10-CM

## 2022-01-11 DIAGNOSIS — K5730 Diverticulosis of large intestine without perforation or abscess without bleeding: Secondary | ICD-10-CM | POA: Insufficient documentation

## 2022-01-11 DIAGNOSIS — C541 Malignant neoplasm of endometrium: Secondary | ICD-10-CM | POA: Diagnosis present

## 2022-01-11 DIAGNOSIS — I428 Other cardiomyopathies: Secondary | ICD-10-CM | POA: Diagnosis not present

## 2022-01-11 DIAGNOSIS — E042 Nontoxic multinodular goiter: Secondary | ICD-10-CM | POA: Insufficient documentation

## 2022-01-11 DIAGNOSIS — C7800 Secondary malignant neoplasm of unspecified lung: Secondary | ICD-10-CM | POA: Insufficient documentation

## 2022-01-11 DIAGNOSIS — N3090 Cystitis, unspecified without hematuria: Secondary | ICD-10-CM | POA: Diagnosis not present

## 2022-01-11 DIAGNOSIS — R110 Nausea: Secondary | ICD-10-CM | POA: Insufficient documentation

## 2022-01-11 DIAGNOSIS — T451X5A Adverse effect of antineoplastic and immunosuppressive drugs, initial encounter: Secondary | ICD-10-CM | POA: Insufficient documentation

## 2022-01-11 DIAGNOSIS — R531 Weakness: Secondary | ICD-10-CM | POA: Insufficient documentation

## 2022-01-11 DIAGNOSIS — Z7952 Long term (current) use of systemic steroids: Secondary | ICD-10-CM | POA: Diagnosis not present

## 2022-01-11 DIAGNOSIS — Z79899 Other long term (current) drug therapy: Secondary | ICD-10-CM | POA: Diagnosis not present

## 2022-01-11 DIAGNOSIS — D61818 Other pancytopenia: Secondary | ICD-10-CM | POA: Diagnosis not present

## 2022-01-11 DIAGNOSIS — C55 Malignant neoplasm of uterus, part unspecified: Secondary | ICD-10-CM

## 2022-01-11 DIAGNOSIS — K1231 Oral mucositis (ulcerative) due to antineoplastic therapy: Secondary | ICD-10-CM | POA: Diagnosis not present

## 2022-01-11 LAB — CMP (CANCER CENTER ONLY)
ALT: 21 U/L (ref 0–44)
AST: 13 U/L — ABNORMAL LOW (ref 15–41)
Albumin: 3.8 g/dL (ref 3.5–5.0)
Alkaline Phosphatase: 96 U/L (ref 38–126)
Anion gap: 6 (ref 5–15)
BUN: 8 mg/dL (ref 6–20)
CO2: 30 mmol/L (ref 22–32)
Calcium: 9 mg/dL (ref 8.9–10.3)
Chloride: 104 mmol/L (ref 98–111)
Creatinine: 0.64 mg/dL (ref 0.44–1.00)
GFR, Estimated: >60 mL/min (ref >60)
Glucose, Bld: 126 mg/dL — ABNORMAL HIGH (ref 70–99)
Potassium: 3.4 mmol/L — ABNORMAL LOW (ref 3.5–5.1)
Sodium: 140 mmol/L (ref 135–145)
Total Bilirubin: 0.5 mg/dL (ref 0.3–1.2)
Total Protein: 6.5 g/dL (ref 6.5–8.1)

## 2022-01-11 LAB — CBC WITH DIFFERENTIAL (CANCER CENTER ONLY)
Abs Immature Granulocytes: 0.67 10*3/uL — ABNORMAL HIGH (ref 0.00–0.07)
Basophils Absolute: 0.1 10*3/uL (ref 0.0–0.1)
Basophils Relative: 1 %
Eosinophils Absolute: 0 10*3/uL (ref 0.0–0.5)
Eosinophils Relative: 0 %
HCT: 30.3 % — ABNORMAL LOW (ref 36.0–46.0)
Hemoglobin: 9.9 g/dL — ABNORMAL LOW (ref 12.0–15.0)
Immature Granulocytes: 13 %
Lymphocytes Relative: 20 %
Lymphs Abs: 1.1 10*3/uL (ref 0.7–4.0)
MCH: 30.3 pg (ref 26.0–34.0)
MCHC: 32.7 g/dL (ref 30.0–36.0)
MCV: 92.7 fL (ref 80.0–100.0)
Monocytes Absolute: 0.8 10*3/uL (ref 0.1–1.0)
Monocytes Relative: 15 %
Neutro Abs: 2.7 10*3/uL (ref 1.7–7.7)
Neutrophils Relative %: 51 %
Platelet Count: 19 10*3/uL — ABNORMAL LOW (ref 150–400)
RBC: 3.27 MIL/uL — ABNORMAL LOW (ref 3.87–5.11)
RDW: 13.9 % (ref 11.5–15.5)
WBC Count: 5.3 10*3/uL (ref 4.0–10.5)
nRBC: 3.8 % — ABNORMAL HIGH (ref 0.0–0.2)

## 2022-01-11 LAB — SAMPLE TO BLOOD BANK

## 2022-01-11 NOTE — Progress Notes (Signed)
Per Dr. Alvy Bimler, no need for blood/platelet transfusion today.  ?

## 2022-01-11 NOTE — Telephone Encounter (Signed)
She called back. Given below message. She verbalized understanding. She will use a peri wash bottle and pat dry instead of wiping.  ?

## 2022-01-11 NOTE — Telephone Encounter (Addendum)
Called and left a message asking her to call the office back. Instructed to continue magic mouth wash as swish and spit 4 x a day. The medication is mixed the same as before. She can also use baking soda and salt water gargles prn. ?

## 2022-01-12 ENCOUNTER — Telehealth: Payer: Self-pay | Admitting: Oncology

## 2022-01-12 NOTE — Telephone Encounter (Signed)
Left a message with appointment to see Dr. Berline Lopes on 01/19/22 at 2:15.  Requested a return call to confirm. ?

## 2022-01-13 ENCOUNTER — Telehealth: Payer: Self-pay

## 2022-01-13 NOTE — Telephone Encounter (Signed)
Pt called and reports she had 7 loose stools yesterday, otherwise is fine, reports one stool today which was also loose. Asks if there is anything OTC she can take. Advised she can tak imodium as directed, but hold off on taking any today since she only had one stool. Encouraged fluids and to avoid overusing imodium since she has a hx of hemorrhoids. Pt also has a hx of IBS, so I advised this could also be something she ate. She knows to call with any concerns or if sx worsen.  ?

## 2022-01-18 ENCOUNTER — Inpatient Hospital Stay: Payer: BC Managed Care – PPO

## 2022-01-18 ENCOUNTER — Other Ambulatory Visit: Payer: Self-pay

## 2022-01-18 ENCOUNTER — Inpatient Hospital Stay (HOSPITAL_BASED_OUTPATIENT_CLINIC_OR_DEPARTMENT_OTHER): Payer: BC Managed Care – PPO | Admitting: Hematology and Oncology

## 2022-01-18 ENCOUNTER — Encounter: Payer: Self-pay | Admitting: Hematology and Oncology

## 2022-01-18 VITALS — BP 137/76 | HR 93 | Temp 98.3°F | Resp 18 | Ht 68.0 in | Wt 123.2 lb

## 2022-01-18 DIAGNOSIS — D61818 Other pancytopenia: Secondary | ICD-10-CM

## 2022-01-18 DIAGNOSIS — C541 Malignant neoplasm of endometrium: Secondary | ICD-10-CM | POA: Diagnosis not present

## 2022-01-18 DIAGNOSIS — C55 Malignant neoplasm of uterus, part unspecified: Secondary | ICD-10-CM

## 2022-01-18 DIAGNOSIS — N3090 Cystitis, unspecified without hematuria: Secondary | ICD-10-CM

## 2022-01-18 DIAGNOSIS — C7800 Secondary malignant neoplasm of unspecified lung: Secondary | ICD-10-CM

## 2022-01-18 DIAGNOSIS — T451X5A Adverse effect of antineoplastic and immunosuppressive drugs, initial encounter: Secondary | ICD-10-CM

## 2022-01-18 DIAGNOSIS — I427 Cardiomyopathy due to drug and external agent: Secondary | ICD-10-CM

## 2022-01-18 DIAGNOSIS — C7801 Secondary malignant neoplasm of right lung: Secondary | ICD-10-CM

## 2022-01-18 LAB — CMP (CANCER CENTER ONLY)
ALT: 17 U/L (ref 0–44)
AST: 17 U/L (ref 15–41)
Albumin: 3.6 g/dL (ref 3.5–5.0)
Alkaline Phosphatase: 107 U/L (ref 38–126)
Anion gap: 5 (ref 5–15)
BUN: 8 mg/dL (ref 6–20)
CO2: 31 mmol/L (ref 22–32)
Calcium: 8.6 mg/dL — ABNORMAL LOW (ref 8.9–10.3)
Chloride: 106 mmol/L (ref 98–111)
Creatinine: 0.66 mg/dL (ref 0.44–1.00)
GFR, Estimated: >60 mL/min (ref >60)
Glucose, Bld: 116 mg/dL — ABNORMAL HIGH (ref 70–99)
Potassium: 3.4 mmol/L — ABNORMAL LOW (ref 3.5–5.1)
Sodium: 142 mmol/L (ref 135–145)
Total Bilirubin: 0.4 mg/dL (ref 0.3–1.2)
Total Protein: 6 g/dL — ABNORMAL LOW (ref 6.5–8.1)

## 2022-01-18 LAB — CBC WITH DIFFERENTIAL (CANCER CENTER ONLY)
Abs Immature Granulocytes: 1.18 10*3/uL — ABNORMAL HIGH (ref 0.00–0.07)
Basophils Absolute: 0.2 10*3/uL — ABNORMAL HIGH (ref 0.0–0.1)
Basophils Relative: 1 %
Eosinophils Absolute: 0 10*3/uL (ref 0.0–0.5)
Eosinophils Relative: 0 %
HCT: 28.6 % — ABNORMAL LOW (ref 36.0–46.0)
Hemoglobin: 9.5 g/dL — ABNORMAL LOW (ref 12.0–15.0)
Immature Granulocytes: 8 %
Lymphocytes Relative: 9 %
Lymphs Abs: 1.4 10*3/uL (ref 0.7–4.0)
MCH: 31.6 pg (ref 26.0–34.0)
MCHC: 33.2 g/dL (ref 30.0–36.0)
MCV: 95 fL (ref 80.0–100.0)
Monocytes Absolute: 0.7 10*3/uL (ref 0.1–1.0)
Monocytes Relative: 4 %
Neutro Abs: 12 10*3/uL — ABNORMAL HIGH (ref 1.7–7.7)
Neutrophils Relative %: 78 %
Platelet Count: 206 10*3/uL (ref 150–400)
RBC: 3.01 MIL/uL — ABNORMAL LOW (ref 3.87–5.11)
RDW: 17.6 % — ABNORMAL HIGH (ref 11.5–15.5)
WBC Count: 15.5 10*3/uL — ABNORMAL HIGH (ref 4.0–10.5)
nRBC: 0.8 % — ABNORMAL HIGH (ref 0.0–0.2)

## 2022-01-18 LAB — SAMPLE TO BLOOD BANK

## 2022-01-18 MED ORDER — SODIUM CHLORIDE 0.9% FLUSH
10.0000 mL | Freq: Once | INTRAVENOUS | Status: AC
  Administered 2022-01-18: 10 mL

## 2022-01-18 MED ORDER — SODIUM CHLORIDE 0.9 % IV SOLN
720.0000 mg/m2 | Freq: Once | INTRAVENOUS | Status: AC
  Administered 2022-01-18: 1178 mg via INTRAVENOUS
  Filled 2022-01-18: qty 30.98

## 2022-01-18 MED ORDER — HEPARIN SOD (PORK) LOCK FLUSH 100 UNIT/ML IV SOLN
500.0000 [IU] | Freq: Once | INTRAVENOUS | Status: AC | PRN
  Administered 2022-01-18: 500 [IU]

## 2022-01-18 MED ORDER — SODIUM CHLORIDE 0.9 % IV SOLN: Freq: Once | INTRAVENOUS | Status: AC

## 2022-01-18 MED ORDER — SODIUM CHLORIDE 0.9% FLUSH
10.0000 mL | INTRAVENOUS | Status: DC | PRN
  Administered 2022-01-18: 10 mL

## 2022-01-18 MED ORDER — PROCHLORPERAZINE MALEATE 10 MG PO TABS: 10.0000 mg | ORAL_TABLET | Freq: Once | ORAL | Status: DC

## 2022-01-18 NOTE — Patient Instructions (Signed)
[facility]  Discharge Instructions: Thank you for choosing [facility] to provide your oncology and hematology care.   If you have a lab appointment with the Cancer Center, please go directly to the Cancer Center and check in at the registration area.   Wear comfortable clothing and clothing appropriate for easy access to any Portacath or PICC line.   We strive to give you quality time with your provider. You may need to reschedule your appointment if you arrive late (15 or more minutes).  Arriving late affects you and other patients whose appointments are after yours.  Also, if you miss three or more appointments without notifying the office, you may be dismissed from the clinic at the provider's discretion.      For prescription refill requests, have your pharmacy contact our office and allow 72 hours for refills to be completed.    Today you received the following chemotherapy and/or immunotherapy agent: Gemcitabine (Gemzar)   To help prevent nausea and vomiting after your treatment, we encourage you to take your nausea medication as directed.  BELOW ARE SYMPTOMS THAT SHOULD BE REPORTED IMMEDIATELY: *FEVER GREATER THAN 100.4 F (38 C) OR HIGHER *CHILLS OR SWEATING *NAUSEA AND VOMITING THAT IS NOT CONTROLLED WITH YOUR NAUSEA MEDICATION *UNUSUAL SHORTNESS OF BREATH *UNUSUAL BRUISING OR BLEEDING *URINARY PROBLEMS (pain or burning when urinating, or frequent urination) *BOWEL PROBLEMS (unusual diarrhea, constipation, pain near the anus) TENDERNESS IN MOUTH AND THROAT WITH OR WITHOUT PRESENCE OF ULCERS (sore throat, sores in mouth, or a toothache) UNUSUAL RASH, SWELLING OR PAIN  UNUSUAL VAGINAL DISCHARGE OR ITCHING   Items with * indicate a potential emergency and should be followed up as soon as possible or go to the Emergency Department if any problems should occur.  Please show the CHEMOTHERAPY ALERT CARD or IMMUNOTHERAPY ALERT CARD at  check-in to the Emergency Department and triage nurse.  Should you have questions after your visit or need to cancel or reschedule your appointment, please contact [facility]  Dept: [phone]  and follow the prompts.  Office hours are 8:00 a.m. to 4:30 p.m. Monday - Friday. Please note that voicemails left after 4:00 p.m. may not be returned until the following business day.  We are closed weekends and major holidays. You have access to a nurse at all times for urgent questions. Please call the main number to the clinic Dept: [phone] and follow the prompts.   For any non-urgent questions, you may also contact your provider using MyChart. We now offer e-Visits for anyone 18 and older to request care online for non-urgent symptoms. For details visit [email or web address].   Also download the MyChart app! Go to the app store, search "MyChart", open the app, select [place], and log in with your MyChart username and password.  Due to Covid, a mask is required upon entering the hospital/clinic. If you do not have a mask, one will be given to you upon arrival. For doctor visits, patients may have 1 support person aged 18 or older with them. For treatment visits, patients cannot have anyone with them due to current Covid guidelines and our immunocompromised population.   

## 2022-01-18 NOTE — Progress Notes (Signed)
[facility] ?OFFICE PROGRESS NOTE ? ?Patient Care Team: ?Curlene Labrum, MD as PCP - General (Family Medicine) ?[name], RN as Oncology Nurse Navigator (Oncology) ? 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 ?Pancytopenia, acquired ([place]) ?Tammie Gilmore had intermittent pancytopenia from treatment ?Her thrombocytopenia has resolved and her elevated white count is due to recent G-CSF ?Tammie Gilmore has persistent anemia chronic illness but not symptomatic ?Tammie Gilmore does not need transfusion support ? ?Cystitis ?Tammie Gilmore has persistent symptoms of cystitis despite multiple negative urine culture ?I am wondering whether Tammie Gilmore might have vaginitis or atrophic vaginitis ?Tammie Gilmore has appointment to see gynecologist this week ?I have started her on Estrace ? ?Chemotherapy induced cardiomyopathy ([place]) ?Tammie Gilmore has no clinical signs of congestive heart failure ?Recently, due to severe anemia and presyncopal episode, I have to discontinue losartan ?Tammie Gilmore is maintained on beta-blocker ?Her blood pressure is slightly improved today but I am concerned it might drop again next week due to anticipated pancytopenia ?Tammie Gilmore has appointment to see heart failure clinic soon ?If her blood pressure improves and is consistently normal, Tammie Gilmore will resume losartan as directed ? ?Orders Placed This Encounter  ?Procedures  ? CT CHEST ABDOMEN PELVIS W CONTRAST  ?  Standing Status:   Future  ?  Standing Expiration Date:   01/19/2023  ?  Order Specific Question:   Preferred imaging location?  ?  Answer:   [facility]  ?  Order Specific Question:   Radiology Contrast Protocol - do NOT remove file path  ?  Answer:   [email or web address]  ?   Order Specific Question:   Is patient pregnant?  ?  Answer:   No  ? ? ?All questions were answered. The patient knows to call the clinic with any problems, questions or concerns. ?The total time spent in the appointment was 30 minutes encounter with patients including review of chart and various tests results, discussions about plan of care and coordination of care plan ?  ?[name], MD ?01/18/2022 1:18 PM ? ?INTERVAL HISTORY: ?Please see below for problem oriented charting. ?Tammie Gilmore returns for treatment follow-up seen prior to cycle 3 of treatment ?Since last time I saw her, Tammie Gilmore was able to maintain her weight ?Tammie Gilmore continues to have symptoms of cystitis but no fever or chills ?No recent exacerbations of congestive heart failure such as chest pain, shortness of breath or leg swelling ?Denies nausea or constipation ?Energy wise, Tammie Gilmore felt better this past week ? ?REVIEW OF SYSTEMS:   ?Constitutional: Denies fevers, chills or abnormal weight loss ?Eyes: Denies blurriness of vision ?Ears, nose, mouth, throat, and face: Denies mucositis or sore throat ?Respiratory: Denies cough, dyspnea or wheezes ?Cardiovascular: Denies palpitation, chest discomfort or lower extremity swelling ?Gastrointestinal:  Denies nausea, heartburn or change in bowel habits ?Skin: Denies abnormal skin rashes ?Lymphatics: Denies new lymphadenopathy or easy bruising ?Neurological:Denies numbness, tingling or new weaknesses ?Behavioral/Psych: Mood is stable, no new changes  ?All other systems were reviewed with the patient and are negative. ? ?I have reviewed the past medical history, past surgical history, social history and family history with the patient and they are unchanged from previous note. ? ?ALLERGIES:  is allergic to doxycycline. ? ?MEDICATIONS:  ?Current Outpatient Medications  ?  Medication Sig Dispense Refill  ? acetaminophen (TYLENOL) 500 MG tablet Take 1,000 mg by mouth every 6 (six) hours as needed for moderate pain or headache.    ?  bisacodyl 5 MG EC tablet Take 1 tablet (5 mg total) by mouth daily as needed for moderate constipation. 30 tablet 3  ? dexamethasone (DECADRON) 4 MG tablet Take 2 tablets (8 mg total) by mouth daily. Start the day before Taxotere. Then daily after chemo for 2 days. 30 tablet 1  ? estradiol (ESTRACE VAGINAL) 0.1 MG/GM vaginal cream Place 1 Applicatorful vaginally 3 (three) times a week. Place finger tip size amount of cream and insert slightly past the vaginal entrance 3 times weekly at bedtime. 42.5 g 0  ? lidocaine-prilocaine (EMLA) cream Apply to affected area once 30 g 3  ? loratadine (CLARITIN) 10 MG tablet Take 10 mg by mouth daily as needed (for bone aches).    ? LORazepam (ATIVAN) 0.5 MG tablet Take 1 tablet (0.5 mg total) by mouth 2 (two) times daily as needed for anxiety. (Patient not taking: Reported on 12/24/2021) 30 tablet 0  ? magic mouthwash (nystatin, lidocaine, diphenhydrAMINE, alum & mag hydroxide) suspension Swish and spit 5 mL 4 times daily. 240 mL 0  ? magic mouthwash w/lidocaine SOLN Take 5 mLs by mouth 4 (four) times daily. Take 5 mls by mouth 4 times a day, swish and spit. 240 mL 0  ? metoprolol succinate (TOPROL XL) 25 MG 24 hr tablet Take 1 tablet (25 mg total) by mouth at bedtime. (Patient not taking: Reported on 12/24/2021) 30 tablet 11  ? ondansetron (ZOFRAN) 8 MG tablet Take 1 tablet (8 mg total) by mouth every 8 (eight) hours as needed. (Patient not taking: Reported on 12/24/2021) 30 tablet 1  ? oxyCODONE (OXY IR/ROXICODONE) 5 MG immediate release tablet Take 1 tablet (5 mg total) by mouth every 4 (four) hours as needed for severe pain. 30 tablet 0  ? prochlorperazine (COMPAZINE) 10 MG tablet Take 1 tablet (10 mg total) by mouth every 6 (six) hours as needed (Nausea or vomiting). 90 tablet 1  ? ?No current facility-administered medications for this visit.  ? ?Facility-Administered Medications Ordered in Other Visits  ?Medication Dose Route Frequency Provider Last Rate Last Admin  ? 0.9 %   sodium chloride infusion   Intravenous Once [name], MD      ? gemcitabine (GEMZAR) 1,178 mg in sodium chloride 0.9 % 250 mL chemo infusion  720 mg/m2 (Treatment Plan Recorded) Intravenous Once Alvy Bimler, Trooper Olander, MD      ? heparin lock flush 100 unit/mL  500 Units Intracatheter Once PRN [name], MD      ? prochlorperazine (COMPAZINE) tablet 10 mg  10 mg Oral Once Alvy Bimler, Andi Mahaffy, MD      ? sodium chloride flush (NS) 0.9 % injection 10 mL  10 mL Intracatheter PRN [name], MD      ? ? 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 1. 9.5 x 7.6 x 9.0 cm complex, partially necrotic, mass involving the lower uterine segment/ cervix. No obvious direct extension into the parametrium.  ?2. 9 mm left pelvic sidewall lymph node is partially necrotic and worrisome for metastatic adenopathy.  ?3. No findings for abdominal omental or peritoneal surface disease or adenopathy.  ?4. Tiny low-attenuation lesion in the pancreatic head, likely benign cyst but attention on follow-up scans is suggested.  ?5. 2.9 cm fundal fibroid.  ?  ?06/19/2021  Pathology Results  ? FINAL MICROSCOPIC DIAGNOSIS:  ? ?A. 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  ?- See comment.  ? ?COMMENT:  ?The biopsies consist of endocervical mucosa with stromal edema and one biopsy fragment has a microscopic focus with atypical spindle cells consistent with poorly differentiated malignancy.  The differential  ?includes a spindle cell malignancy such as sarcomatoid carcinoma and leiomyosarcoma.  Mullerian adenosarcoma is also a consideration but considered less likely ?  ?06/23/2021 Imaging  ? MR pelvis ? ?10 cm uterine mass with central necrosis, which is centered in the cervix and lower uterine segment. Right parametrial involvement is seen as well as suspected invasion of the distal rectum. Differential diagnosis includes cervical carcinoma and uterine leiomyosarcoma. ?  ?Mild bilateral iliac  lymphadenopathy, highly suspicious for metastatic disease. ?  ?2.9 cm subserosal fibroid in the posterior fundus. ?  ?Normal appearance of both ovaries. ?  ?  ?06/29/2021 PET scan  ? 1. Hypermetabolic necro

## 2022-01-18 NOTE — Assessment & Plan Note (Addendum)
Even though she felt better, she has mild persistent anemia ?She has persistent symptoms of dysuria despite multiple negative urine culture  ?We will proceed with treatment as scheduled ?I plan to order repeat CT imaging at the end of the month to assess response to treatment ?

## 2022-01-18 NOTE — Assessment & Plan Note (Signed)
She has no clinical signs of congestive heart failure ?Recently, due to severe anemia and presyncopal episode, I have to discontinue losartan ?She is maintained on beta-blocker ?Her blood pressure is slightly improved today but I am concerned it might drop again next week due to anticipated pancytopenia ?She has appointment to see heart failure clinic soon ?If her blood pressure improves and is consistently normal, she will resume losartan as directed ?

## 2022-01-18 NOTE — Assessment & Plan Note (Signed)
She had intermittent pancytopenia from treatment ?Her thrombocytopenia has resolved and her elevated white count is due to recent G-CSF ?She has persistent anemia chronic illness but not symptomatic ?She does not need transfusion support ?

## 2022-01-18 NOTE — Assessment & Plan Note (Signed)
She has persistent symptoms of cystitis despite multiple negative urine culture ?I am wondering whether she might have vaginitis or atrophic vaginitis ?She has appointment to see gynecologist this week ?I have started her on Estrace ?

## 2022-01-19 ENCOUNTER — Ambulatory Visit (HOSPITAL_COMMUNITY)
Admission: RE | Admit: 2022-01-19 | Discharge: 2022-01-19 | Disposition: A | Discharge status: Home / Self Care | Payer: BC Managed Care – PPO | Source: Ambulatory Visit | Attending: Internal Medicine | Admitting: Internal Medicine

## 2022-01-19 ENCOUNTER — Ambulatory Visit (HOSPITAL_BASED_OUTPATIENT_CLINIC_OR_DEPARTMENT_OTHER)
Admission: RE | Admit: 2022-01-19 | Discharge: 2022-01-19 | Disposition: A | Discharge status: Home / Self Care | Payer: BC Managed Care – PPO | Source: Ambulatory Visit | Attending: Internal Medicine | Admitting: Internal Medicine

## 2022-01-19 ENCOUNTER — Encounter (HOSPITAL_COMMUNITY): Payer: Self-pay | Admitting: Internal Medicine

## 2022-01-19 ENCOUNTER — Encounter: Payer: Self-pay | Admitting: Hematology and Oncology

## 2022-01-19 VITALS — BP 100/68 | HR 100 | Wt 125.0 lb

## 2022-01-19 DIAGNOSIS — C7800 Secondary malignant neoplasm of unspecified lung: Secondary | ICD-10-CM | POA: Insufficient documentation

## 2022-01-19 DIAGNOSIS — C55 Malignant neoplasm of uterus, part unspecified: Secondary | ICD-10-CM

## 2022-01-19 DIAGNOSIS — T451X5A Adverse effect of antineoplastic and immunosuppressive drugs, initial encounter: Secondary | ICD-10-CM

## 2022-01-19 DIAGNOSIS — I427 Cardiomyopathy due to drug and external agent: Secondary | ICD-10-CM | POA: Insufficient documentation

## 2022-01-19 DIAGNOSIS — Z01818 Encounter for other preprocedural examination: Secondary | ICD-10-CM | POA: Diagnosis not present

## 2022-01-19 DIAGNOSIS — Z79899 Other long term (current) drug therapy: Secondary | ICD-10-CM | POA: Insufficient documentation

## 2022-01-19 LAB — ECHOCARDIOGRAM COMPLETE
Calc EF: 51.4 %
Single Plane A2C EF: 50.5 %
Single Plane A4C EF: 48.9 %

## 2022-01-19 MED ORDER — METOPROLOL SUCCINATE ER 25 MG PO TB24: 25.0000 mg | ORAL_TABLET | Freq: Every day | ORAL | 6 refills | Status: DC

## 2022-01-19 MED ORDER — METOPROLOL SUCCINATE ER 25 MG PO TB24
25.0000 mg | ORAL_TABLET | Freq: Every day | ORAL | Status: DC
Start: 2022-01-19 — End: 2022-01-19

## 2022-01-19 NOTE — Progress Notes (Signed)
? ?CARDIO-ONCOLOGY CLINIC NOTE ? ?Referring Physician: ?Primary Care: ?Primary Cardiologist: ? 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 Elson Areas for enrollment into the Cardio-Oncology program. ? ?Diagnosed with uterine CA in 9/22. PET scan with hypermetabolic necrotic cervical/uterine mass with bilateral external iliac hypermetabolic lymph nodes. No evidence of distant metastatic disease.  ? ?Underwent resection 10/22 ? 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 ?Had chemotherapy Doxorubicin q21d x  completed 5 of 6 Cycles ? ?Remains on Gemcitabine D1,8 + Docetaxel D8 (900/100) q21d  baed on echo  ? ?Echo 11/22 EF 60-65% ?Echo 2/23 EF 40-45% ( I felt closer to 50-55%) ?Echo today 01/19/22 EF 50-55% GLS -16.4%  ? ?Feels ok. Had presyncopal episode recently and Dr. Elson Areas stopped Toprol. BP runs low anyway but no further presyncope.  ? ?Past Medical History:  ?Diagnosis Date  ? Hypoglycemia   ? occasional episodes of hypoglycemia  ? IBS (irritable bowel syndrome)   ? ? ?Current Outpatient Medications  ?Medication Sig Dispense Refill  ? acetaminophen (TYLENOL) 500 MG tablet Take 1,000 mg by mouth every 6 (six) hours as needed for moderate pain or headache.    ? bisacodyl 5 MG EC tablet Take 1 tablet (5 mg total) by mouth daily as needed for moderate constipation. 30 tablet 3  ? dexamethasone (DECADRON) 4 MG tablet Take 2 tablets (8 mg total) by mouth daily. Start the day before Taxotere. Then daily after chemo for 2 days. 30 tablet 1  ? estradiol (ESTRACE VAGINAL) 0.1 MG/GM vaginal cream Place 1 Applicatorful vaginally 3 (three) times a week. Place finger tip size amount of cream and insert slightly past the vaginal entrance 3 times weekly at bedtime. 42.5 g 0  ? lidocaine-prilocaine (EMLA) cream Apply to affected area once 30 g 3  ? loratadine (CLARITIN) 10 MG tablet Take 10 mg by mouth daily as needed (for bone aches).    ? LORazepam (ATIVAN) 0.5 MG tablet Take 1 tablet  (0.5 mg total) by mouth 2 (two) times daily as needed for anxiety. 30 tablet 0  ? magic mouthwash (nystatin, lidocaine, diphenhydrAMINE, alum & mag hydroxide) suspension Swish and spit 5 mL 4 times daily. 240 mL 0  ? ondansetron (ZOFRAN) 8 MG tablet Take 1 tablet (8 mg total) by mouth every 8 (eight) hours as needed. 30 tablet 1  ? oxyCODONE (OXY IR/ROXICODONE) 5 MG immediate release tablet Take 1 tablet (5 mg total) by mouth every 4 (four) hours as needed for severe pain. 30 tablet 0  ? prochlorperazine (COMPAZINE) 10 MG tablet Take 1 tablet (10 mg total) by mouth every 6 (six) hours as needed (Nausea or vomiting). 90 tablet 1  ? ?No current facility-administered medications for this encounter.  ? ? ?Allergies  ?Allergen Reactions  ? Doxycycline   ?  Dizziness, NAUSEA  ? ? ?  ?Social History  ? ?Socioeconomic History  ? Marital status: Married  ?  Spouse name: Not on file  ? Number of children: 3  ? Years of education: Not on file  ? Highest education level: Not on file  ?Occupational History  ? Occupation: accountant  ?Tobacco Use  ? Smoking status: Never  ? Smokeless tobacco: Never  ?Vaping Use  ? Vaping Use: Never used  ?Substance and Sexual Activity  ? Alcohol use: Never  ? Drug use: Never  ? Sexual activity: Not Currently  ?Other Topics Concern  ? Not on file  ?Social History Narrative  ? Not  on file  ? ?Social Determinants of Health  ? ?Financial Resource Strain: Not on file  ?Food Insecurity: Not on file  ?Transportation Needs: Not on file  ?Physical Activity: Not on file  ?Stress: Not on file  ?Social Connections: Not on file  ?Intimate Partner Violence: Not on file  ? ? ?  ?Family History  ?Problem Relation Age of Onset  ? Heart attack Mother   ? Heart disease Mother   ? Endometriosis Mother   ? Heart disease Father   ? Heart attack Father   ? Cancer - Colon Neg Hx   ? Breast cancer Neg Hx   ? Cancer Neg Hx   ? Ovarian cancer Neg Hx   ? Uterine cancer Neg Hx   ? Pancreatic cancer Neg Hx   ? Pancreatic  disease Neg Hx   ? Prostate cancer Neg Hx   ? ? ?Vitals:  ? 01/19/22 1208  ?BP: 100/68  ?Pulse: 100  ?SpO2: 99%  ?Weight: 56.7 kg (125 lb)  ? ? ?PHYSICAL EXAM: ?General:  Well appearing. No resp difficulty ?HEENT: normal ?Neck: supple. no JVD. Carotids 2+ bilat; no bruits. No lymphadenopathy or thryomegaly appreciated. ?Cor: PMI nondisplaced. Regular rate & rhythm. No rubs, gallops or murmurs. ?Lungs: clear ?Abdomen: soft, nontender, nondistended. No hepatosplenomegaly. No bruits or masses. Good bowel sounds. ?Extremities: no cyanosis, clubbing, rash, edema ?Neuro: alert & orientedx3, cranial nerves grossly intact. moves all 4 extremities w/o difficulty. Affect pleasant ? ? ?ASSESSMENT & PLAN: ? ?1. Chemo-induced cardiomyopathy  ?- s/p Doxorubicin q21d x  completed 5 of 6 Cycles ?- Echo 11/22 EF 60-65% ?- Echo 2/23 EF 40-45% ( I felt closer to 50-55%) ?- Echo today 01/19/22 EF 50-55% GLS -16.4%  ?- I suspect she has very mild chemo (doxorubicin) toxicity. I explained the etiology and pathophysiology of this, Reiterated that it is very mild and EF is still in normal range.  ?- EF stable today on low end of normal range ?- Off toprol due to low BP. Continue losartan ?- Suspect low probability of this progressing ?- Continue chemo ?- Repeat echo in 6 months  ? ?2. Uterine CA, stage IV ?- per Dr. Alvy Bimler  ? ?Glori Bickers, MD  ?12:34 PM ? ? ? ?

## 2022-01-19 NOTE — Patient Instructions (Signed)
Continue Metoprolol Succinate 25 mg Daily at bedtime ? ?Stay off the Losartan ? ?Your physician recommends that you schedule a follow-up appointment in: 6 months with an echocardiogram, **PLEASE CALL OUR OFFICE IN AUGUST TO SCHEDULE THESE APPOINTMENTS ? ?If you have any questions or concerns before your next appointment please send [place] a message through [place] or call our office at [phone].   ? ?TO LEAVE A MESSAGE FOR THE NURSE SELECT OPTION 2, PLEASE LEAVE A MESSAGE INCLUDING: ?YOUR NAME ?DATE OF BIRTH ?CALL BACK NUMBER ?REASON FOR CALL**this is important as we prioritize the call backs ? ?YOU WILL RECEIVE A CALL BACK THE SAME DAY AS LONG AS YOU CALL BEFORE 4:00 PM ? ?At the [facility], you and your health needs are our priority. As part of our continuing mission to provide you with exceptional heart care, we have created designated Provider Care Teams. These Care Teams include your primary Cardiologist (physician) and Advanced Practice Providers (APPs- Physician Assistants and Nurse Practitioners) who all work together to provide you with the care you need, when you need it.  ? ?You may see any of the following providers on your designated Care Team at your next follow up: ?Dr Glori Bickers ?Dr Loralie Champagne ?Darrick Grinder, NP ?Lyda Jester, PA ?Jessica Milford,NP ?Marlyce Huge, PA ?Audry Riles, PharmD ? ? ?Please be sure to bring in all your medications bottles to every appointment.  ? ?

## 2022-01-20 ENCOUNTER — Encounter: Payer: Self-pay | Admitting: Hematology and Oncology

## 2022-01-21 ENCOUNTER — Encounter: Payer: Self-pay | Admitting: Gynecologic Oncology

## 2022-01-22 ENCOUNTER — Inpatient Hospital Stay: Payer: BC Managed Care – PPO

## 2022-01-22 ENCOUNTER — Inpatient Hospital Stay (HOSPITAL_BASED_OUTPATIENT_CLINIC_OR_DEPARTMENT_OTHER): Payer: BC Managed Care – PPO | Admitting: Gynecologic Oncology

## 2022-01-22 ENCOUNTER — Other Ambulatory Visit: Payer: Self-pay

## 2022-01-22 ENCOUNTER — Encounter: Payer: Self-pay | Admitting: Hematology and Oncology

## 2022-01-22 ENCOUNTER — Encounter: Payer: Self-pay | Admitting: Gynecologic Oncology

## 2022-01-22 VITALS — BP 116/67 | HR 118 | Temp 97.7°F | Resp 18 | Ht 68.0 in | Wt 124.6 lb

## 2022-01-22 DIAGNOSIS — R300 Dysuria: Secondary | ICD-10-CM | POA: Diagnosis not present

## 2022-01-22 DIAGNOSIS — C55 Malignant neoplasm of uterus, part unspecified: Secondary | ICD-10-CM | POA: Diagnosis not present

## 2022-01-22 DIAGNOSIS — C541 Malignant neoplasm of endometrium: Secondary | ICD-10-CM | POA: Diagnosis not present

## 2022-01-22 LAB — URINALYSIS, COMPLETE (UACMP) WITH MICROSCOPIC
Bilirubin Urine: NEGATIVE
Glucose, UA: NEGATIVE mg/dL
Ketones, ur: NEGATIVE mg/dL
Nitrite: NEGATIVE
Protein, ur: 100 mg/dL — AB
RBC / HPF: >50 RBC/hpf — ABNORMAL HIGH (ref 0–5)
Specific Gravity, Urine: 1.021 (ref 1.005–1.030)
pH: 5 (ref 5.0–8.0)

## 2022-01-22 NOTE — Patient Instructions (Signed)
It was good to see you today. ? ?Given your urinary symptoms, I am going to check a urine culture again to make sure that you do not have an infection. ? ?Please keep using the vaginal estrogen.  I would recommend using it every night for 2 weeks and then decreasing to 3 times a week at night. ? ?If the urine culture is negative for an infection, I will send you in a prescription for a couple day course of a medication called Pyridium which helps with bladder spasms.  If this is not effective, then I think we need to get you into see a urologist to consider looking inside your bladder with a camera or other studies to help identify why you are having the symptoms. ?

## 2022-01-22 NOTE — Progress Notes (Signed)
Gynecologic Oncology Return Clinic Visit ? ?01/22/2022 ? ?Reason for Visit: Dysuria ? 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 1. 9.5 x 7.6 x 9.0 cm complex, partially necrotic, mass involving the lower uterine segment/ cervix. No obvious direct extension into the parametrium.  ?2. 9 mm left pelvic sidewall lymph node is partially necrotic and worrisome for metastatic adenopathy.  ?3. No findings for abdominal omental or peritoneal surface disease or adenopathy.  ?4. Tiny low-attenuation lesion in the pancreatic head, likely benign cyst but attention on follow-up scans is suggested.  ?5. 2.9 cm fundal fibroid.  ?  ?06/19/2021 Pathology Results  ? FINAL MICROSCOPIC DIAGNOSIS:  ? ?A. 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  ?- See comment.  ? ?COMMENT:  ?The biopsies consist of endocervical mucosa with stromal edema and one biopsy fragment has a microscopic focus with atypical spindle cells consistent with poorly differentiated malignancy.  The differential  ?includes a spindle cell malignancy such as sarcomatoid carcinoma and leiomyosarcoma.  Mullerian adenosarcoma is also a consideration but considered less likely ?  ?06/23/2021 Imaging  ? MR pelvis ? ?10 cm uterine mass with central necrosis, which is centered in the cervix and lower uterine segment. Right parametrial involvement is seen as well as suspected invasion of the distal rectum. Differential diagnosis includes cervical carcinoma and uterine leiomyosarcoma. ?  ?Mild bilateral iliac lymphadenopathy, highly suspicious for metastatic disease. ?  ?2.9 cm subserosal fibroid in the posterior fundus. ?  ?Normal appearance of both ovaries. ?  ?  ?06/29/2021 PET scan  ? 1. Hypermetabolic necrotic cervical/uterine mass with bilateral external iliac hypermetabolic lymph nodes. No evidence of distant metastatic disease. ?2. 1.5 cm low-attenuation left thyroid nodule. Recommend thyroid ultrasound. (Ref:  J Am Coll Radiol. 2015 Feb;12(2): 143-50). ?  ?07/17/2021 Pathology Results  ? A: Uterus with cervix and bilateral ovaries and fallopian tubes, radical hysterectomy and bilateral salpingo-oophorectomy ?- Leiomyosarcoma, high grade (grade 3 / 3) with extensive epithelioid, pleomorphic, and myxoid areas and associated necrosis (~20%) ?- Tumor based in cervix and also involves lower uterine segment ?- Cervicovaginal margin involved by focal invasive leiomyosarcoma (3:00-5:00, A10) as well as tumor in lymphovascular spaces ?- Leiomyosarcoma involves right and left parametrial tissue and extends to parametrial margins ?- Extensive lymphovascular space invasion present, including in uterus and parametria ?- See synoptic report and comment ?  ?Other findings: ?- Leiomyomata with hyalinization, size up to 3.0 cm ?- Ovaries and fallopian tubes with no parenchymal involvement by leiomyosarcoma identified, although adnexal lymphovascular space invasion is present ?  ?B: Lymph nodes, right pelvic, lymphadenectomy ?- One of four lymph nodes positive for metastatic leiomyosarcoma (1/4), with extracapsular extension present ?  ?C: Lymph nodes, left pelvic, lymphadenectomy ?- One of four lymph nodes positive for metastatic leiomyosarcoma (1/4), with extracapsular extension present ? ?Immunohistochemical stains are performed on block A11, and demonstrate that the tumor is positive for desmin and CD10, with SMA staining the majority of the spindle cell component but largely negative in the epithelioid / pleomorphic component. OSCAR, pancytokeratin AE1/AE3, HMB45, and PR appear negative in the tumor. ER shows patchy weak staining and myogenin stains rare cells. Block A23 also shows positive desmin and negative OSCAR pancytokeratin. Overall, the findings are most consistent with leiomyosarcoma, with extensive areas that are myxoid, epithelioid, and pleomorphic as well as more typical spindle cell areas within the overall high grade  tumor (grade 3 / 3). The tumor is staged as pT2b (involves other  pelvic tissues) given the parametrial involvement and pN1 for FIGO stage IIIC.  ?  ?07/17/2021 Surgery  ? Date of Surgery: 07/17/21 ? ?Preoperative Diagnosis: High Grade Uterine Sarcoma ? ?Postoperative Diagnosis: Same ? ?Procedure(s): ?Bilateral - RADICAL ABDOMINAL HYSTER, W/BIL TOTAL PELVIC LYMPHADENECTOMY & PARA-AORTIC LYMPH NODE BX W/WO REM TUBE/OVAR ?VAGINAL HYSTERECTOMY, FOR UTERUS 250 G OR LESS; WITH REPAIR OF ENTEROCELE ?COLECTOMY, PARTIAL; WITH COLOPROCTOSTOMY (LOW PELVIC ANASTOMOSIS) WITH COLOSTOMY ?CYSTOURETHROSCOPY, WITH INSERTION OF INDWELLING URETERAL STENT (EG, GIBBONS OR DOUBLE-J TYPE) ?- Cystourethroscopy ?- Bilateral ureteral stent placement ?- Foley catheter placement ? ?Performing Service: Gynecology Oncology ?Surgeon(s) and Role: ?Panel 1: ?* Lafonda Mosses, MD - Primary ?Bernadene Bell, MD - Resident - Assisting ?Devonne Doughty, MD - Resident - Assisting ?Panel 2: ?* Franchot Erichsen, MD - Primary ? ?Drains:  ?- Left 6Fr open-ended ureteral access catheter (green) ?- Right 5Fr open-ended ureteral access catheter (white) ?- 16Fr foley catheter to drainage ? ?* No implants in log * ? ?Indications: 56 y.o. female with high grade uterine sarcoma. Urology was consulted pre-operatively for placement of bilateral ureteral stents. Risks, benefits, and alternatives of the above procedure were discussed and informed consent was signed. ? ?OperativeFindings:  ?- Grossly distorted architecture of urinary bladder likely 2/2 pelvic mass with anterolaterally positioned UOs ?- Successful placement of bilateral open-ended ureteral catheters under direct visualization ?- Foley catheter placed at case conclusion ? ?Description: The patient was correctly identified in the preop holding area where written informed consent as well potential risk and complication reviewed. She agreed. The patient was brought to the operative suite where  a preinduction timeout was performed. Once correct information was verified, general anesthesia was induced. The patient was then gently placed into dorsal lithotomy position with SCDs in place for VTE prophylaxis. They were prepped and draped in the usual sterile fashion and given appropriate preoperative antibiotics. A second timeout was then performed.  ? ?We inserted a 89F rigid cystoscope per urethra with copious lubrication and normal saline irrigation running. We performed cystourethroscopy, which revealed the above findings. ? ?We turned our attention to the left ureteral orifice and canulated it with a sensor wire, using assistance of a 6Fr open-ended catheter. The wire was advanced into the renal pelvis without difficulty under visual guidance. We then advanced the stent over our wire into the renal pelvis under direct visualization and feel without complication. The wire was subsequently removed.  ? ?We then turned our attention to the right ureteral orifice and canulated it with a sensor wire, using assistance of a 5Fr open-ended catheter. The wire was advanced into the renal pelvis without difficulty under visual guidance. We then advanced the stent over our wire into the renal pelvis under direct visualization and feel without complication. The wire was subsequently removed.  ? ?A 16Fr straight catheter was placed, with return of urine indicating appropriate position within the bladder. The balloon was inflated with 10cc sterile water. The stents were secured to the Foley using 0-silk ties, being careful not to occlude the stents or Foley.  ? ?The patient was awoken from general anesthesia having tolerated the procedure well and taken to the PACU for routine post-operative recovery. ? ?Post-Op Plan:  ?- Foley and stents per primary team  ?  ?07/17/2021 Surgery  ? Date of Surgery: 07/17/2021 ? ?Pre-op Diagnosis: Uterine spindle cell malignancy ? ?Post-op Diagnosis: Same ? ?Procedure(s): ?Panel 1 ?RADICAL  ABDOMINAL HYSTER, with bilateral S&O, vagineconty upper, bilateral pelvic lyphadenectomy, bilateral ureterolysis,:  [id number] (CPT?) ?Panel 2 ?CYSTOURETHROSCOPY, WITH INSERTION OF INDWELLING URETERAL STENT (EG

## 2022-01-23 LAB — URINE CULTURE: Culture: <10000 — AB

## 2022-01-25 ENCOUNTER — Encounter: Payer: Self-pay | Admitting: Hematology and Oncology

## 2022-01-25 ENCOUNTER — Other Ambulatory Visit: Payer: Self-pay | Admitting: Gynecologic Oncology

## 2022-01-25 ENCOUNTER — Other Ambulatory Visit (HOSPITAL_COMMUNITY): Payer: Self-pay

## 2022-01-25 ENCOUNTER — Other Ambulatory Visit: Payer: Self-pay

## 2022-01-25 ENCOUNTER — Inpatient Hospital Stay: Payer: BC Managed Care – PPO

## 2022-01-25 ENCOUNTER — Telehealth: Payer: Self-pay | Admitting: *Deleted

## 2022-01-25 ENCOUNTER — Telehealth: Payer: Self-pay

## 2022-01-25 ENCOUNTER — Inpatient Hospital Stay: Payer: BC Managed Care – PPO | Admitting: Hematology and Oncology

## 2022-01-25 VITALS — HR 91

## 2022-01-25 DIAGNOSIS — C7801 Secondary malignant neoplasm of right lung: Secondary | ICD-10-CM

## 2022-01-25 DIAGNOSIS — C7800 Secondary malignant neoplasm of unspecified lung: Secondary | ICD-10-CM

## 2022-01-25 DIAGNOSIS — C55 Malignant neoplasm of uterus, part unspecified: Secondary | ICD-10-CM

## 2022-01-25 DIAGNOSIS — K1231 Oral mucositis (ulcerative) due to antineoplastic therapy: Secondary | ICD-10-CM

## 2022-01-25 DIAGNOSIS — I427 Cardiomyopathy due to drug and external agent: Secondary | ICD-10-CM

## 2022-01-25 DIAGNOSIS — N3090 Cystitis, unspecified without hematuria: Secondary | ICD-10-CM | POA: Diagnosis not present

## 2022-01-25 DIAGNOSIS — R300 Dysuria: Secondary | ICD-10-CM

## 2022-01-25 DIAGNOSIS — D61818 Other pancytopenia: Secondary | ICD-10-CM | POA: Diagnosis not present

## 2022-01-25 DIAGNOSIS — C541 Malignant neoplasm of endometrium: Secondary | ICD-10-CM | POA: Diagnosis not present

## 2022-01-25 DIAGNOSIS — T451X5A Adverse effect of antineoplastic and immunosuppressive drugs, initial encounter: Secondary | ICD-10-CM

## 2022-01-25 LAB — CMP (CANCER CENTER ONLY)
ALT: 29 U/L (ref 0–44)
AST: 19 U/L (ref 15–41)
Albumin: 3.7 g/dL (ref 3.5–5.0)
Alkaline Phosphatase: 85 U/L (ref 38–126)
Anion gap: 9 (ref 5–15)
BUN: 12 mg/dL (ref 6–20)
CO2: 25 mmol/L (ref 22–32)
Calcium: 8.9 mg/dL (ref 8.9–10.3)
Chloride: 105 mmol/L (ref 98–111)
Creatinine: 0.66 mg/dL (ref 0.44–1.00)
GFR, Estimated: >60 mL/min (ref >60)
Glucose, Bld: 227 mg/dL — ABNORMAL HIGH (ref 70–99)
Potassium: 3.6 mmol/L (ref 3.5–5.1)
Sodium: 139 mmol/L (ref 135–145)
Total Bilirubin: 0.5 mg/dL (ref 0.3–1.2)
Total Protein: 6.5 g/dL (ref 6.5–8.1)

## 2022-01-25 LAB — CBC WITH DIFFERENTIAL (CANCER CENTER ONLY)
Abs Immature Granulocytes: 0.03 10*3/uL (ref 0.00–0.07)
Basophils Absolute: 0 10*3/uL (ref 0.0–0.1)
Basophils Relative: 0 %
Eosinophils Absolute: 0 10*3/uL (ref 0.0–0.5)
Eosinophils Relative: 0 %
HCT: 27.7 % — ABNORMAL LOW (ref 36.0–46.0)
Hemoglobin: 9 g/dL — ABNORMAL LOW (ref 12.0–15.0)
Immature Granulocytes: 1 %
Lymphocytes Relative: 8 %
Lymphs Abs: 0.4 10*3/uL — ABNORMAL LOW (ref 0.7–4.0)
MCH: 30.6 pg (ref 26.0–34.0)
MCHC: 32.5 g/dL (ref 30.0–36.0)
MCV: 94.2 fL (ref 80.0–100.0)
Monocytes Absolute: 0.1 10*3/uL (ref 0.1–1.0)
Monocytes Relative: 2 %
Neutro Abs: 4.4 10*3/uL (ref 1.7–7.7)
Neutrophils Relative %: 89 %
Platelet Count: 211 10*3/uL (ref 150–400)
RBC: 2.94 MIL/uL — ABNORMAL LOW (ref 3.87–5.11)
RDW: 16.3 % — ABNORMAL HIGH (ref 11.5–15.5)
WBC Count: 4.9 10*3/uL (ref 4.0–10.5)
nRBC: 0 % (ref 0.0–0.2)

## 2022-01-25 LAB — SAMPLE TO BLOOD BANK

## 2022-01-25 MED ORDER — ALUM & MAG HYDROXIDE-SIMETH 200-200-20 MG/5ML PO SUSP
30.0000 mL | Freq: Once | ORAL | Status: AC
  Administered 2022-01-25: 30 mL via ORAL
  Filled 2022-01-25: qty 30

## 2022-01-25 MED ORDER — HEPARIN SOD (PORK) LOCK FLUSH 100 UNIT/ML IV SOLN
500.0000 [IU] | Freq: Once | INTRAVENOUS | Status: AC | PRN
  Administered 2022-01-25: 500 [IU]

## 2022-01-25 MED ORDER — SODIUM CHLORIDE 0.9% FLUSH
10.0000 mL | Freq: Once | INTRAVENOUS | Status: AC
  Administered 2022-01-25: 10 mL

## 2022-01-25 MED ORDER — SODIUM CHLORIDE 0.9 % IV SOLN
720.0000 mg/m2 | Freq: Once | INTRAVENOUS | Status: AC
  Administered 2022-01-25: 1178 mg via INTRAVENOUS
  Filled 2022-01-25: qty 30.98

## 2022-01-25 MED ORDER — SODIUM CHLORIDE 0.9 % IV SOLN
10.0000 mg | Freq: Once | INTRAVENOUS | Status: AC
  Administered 2022-01-25: 10 mg via INTRAVENOUS
  Filled 2022-01-25: qty 10

## 2022-01-25 MED ORDER — NYSTATIN 100000 UNIT/ML MT SUSP
5.0000 mL | Freq: Four times a day (QID) | OROMUCOSAL | 0 refills | Status: DC
  Filled 2022-01-25: qty 240, 12d supply, fill #0

## 2022-01-25 MED ORDER — SODIUM CHLORIDE 0.9% FLUSH
10.0000 mL | INTRAVENOUS | Status: DC | PRN
  Administered 2022-01-25: 10 mL

## 2022-01-25 MED ORDER — SODIUM CHLORIDE 0.9 % IV SOLN: Freq: Once | INTRAVENOUS | Status: AC

## 2022-01-25 MED ORDER — SODIUM CHLORIDE 0.9 % IV SOLN
80.0000 mg/m2 | Freq: Once | INTRAVENOUS | Status: AC
  Administered 2022-01-25: 130 mg via INTRAVENOUS
  Filled 2022-01-25: qty 13

## 2022-01-25 MED ORDER — PHENAZOPYRIDINE HCL 100 MG PO TABS: 100.0000 mg | ORAL_TABLET | Freq: Three times a day (TID) | ORAL | 2 refills | Status: DC | PRN

## 2022-01-25 NOTE — Assessment & Plan Note (Signed)
Mucositis is improving ?She will continue Magic mouthwash as needed and also baking soda mixed with salt water gargle ?

## 2022-01-25 NOTE — Telephone Encounter (Signed)
Attempted to speak with pt today to let her know that per Joylene John, NP her urine culture is insignificant and Dr.Tucker has sent in some Pyridium and we will refer her to a urologist. LVM for return call.  ?

## 2022-01-25 NOTE — Telephone Encounter (Signed)
Spoke with pt today to inform her that a prescription of Pyridium has been sent into her pharmacy and ready for her to pick up. We will also make a referral to urology for pt as well. Pt mentioned that she has been experiencing some puffiness and soreness in the area where she urinates from. She notices discomfort when she sits. Over the weekend she noticed a strong odor like when she has had UTIs in the past. On Wednesday or Thursday of last week her temperature was 99.4. She has not tried OTC Azo for this. She stated when she had this happen in the past someone prescribed her some antibiotics and it helped it. She stated she forgot to mention this to Dr.Tucker on Friday. Dr.Tucker, MD informed.  ?

## 2022-01-25 NOTE — Assessment & Plan Note (Signed)
Even though she felt better, she has mild persistent anemia ?She has persistent symptoms of dysuria despite multiple negative urine culture  ?We will proceed with treatment as scheduled ?I plan to order repeat CT imaging at the end of the month to assess response to treatment ?

## 2022-01-25 NOTE — Telephone Encounter (Signed)
Spoke with pt this afternoon to let her know that per Dr.Tucker pt can try Azo OTC. If her symptoms do not improve then she can call us back and let us know and we will prescribe antibiotics. Pt verbalized understanding with no additional questions.  ?

## 2022-01-25 NOTE — Assessment & Plan Note (Signed)
I have reviewed her recent ECHO which showed improvement in her EF ?She will continue medical management ?

## 2022-01-25 NOTE — Progress Notes (Signed)
[facility] ?OFFICE PROGRESS NOTE ? ?Patient Care Team: ?Curlene Labrum, MD as PCP - General (Family Medicine) ?[name], RN as Oncology Nurse Navigator (Oncology) ? 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 ?Chemotherapy induced cardiomyopathy ([place]) ?I have reviewed her recent ECHO which showed improvement in her EF ?She will continue medical management ? ?Pancytopenia, acquired ([place]) ?She had intermittent pancytopenia from treatment ?She has persistent anemia chronic illness but not symptomatic ?She does not need transfusion support ? ?Cystitis ?She has persistent symptoms of cystitis despite multiple negative urine cultures ?I am wondering whether she might have vaginitis or atrophic vaginitis ?She was seen by GYN oncologist last week ?She will continue Estrace ? ?Mucositis due to antineoplastic therapy ?Mucositis is improving ?She will continue Magic mouthwash as needed and also baking soda mixed with salt water gargle ? ?No orders of the defined types were placed in this encounter. ? ? ?All questions were answered. The patient knows to call the clinic with any problems, questions or concerns. ?The total time spent in the appointment was 30 minutes encounter with patients including review of chart and various tests results, discussions about plan of care and coordination of care plan ?  ?[name], MD ?01/25/2022 6:00 PM ? ?INTERVAL HISTORY: ?Please see below for problem oriented charting. ?she returns for treatment follow-up seen prior to day 1 of chemo ?She had intermittent mucositis ?Has persistent symptoms of cystitis ?She had ECHO last week which showed improvement ?No nausea or recent changes in bowel  habits ? ?REVIEW OF SYSTEMS:   ?Constitutional: Denies fevers, chills or abnormal weight loss ?Eyes: Denies blurriness of vision ?Respiratory: Denies cough, dyspnea or wheezes ?Cardiovascular: Denies palpitation, chest discomfort or lower extremity swelling ?Gastrointestinal:  Denies nausea, heartburn or change in bowel habits ?Skin: Denies abnormal skin rashes ?Lymphatics: Denies new lymphadenopathy or easy bruising ?Neurological:Denies numbness, tingling or new weaknesses ?Behavioral/Psych: Mood is stable, no new changes  ?All other systems were reviewed with the patient and are negative. ? ?I have reviewed the past medical history, past surgical history, social history and family history with the patient and they are unchanged from previous note. ? ?ALLERGIES:  is allergic to doxycycline. ? ?MEDICATIONS:  ?Current Outpatient Medications  ?Medication Sig Dispense Refill  ? acetaminophen (TYLENOL) 500 MG tablet Take 1,000 mg by mouth every 6 (six) hours as needed for moderate pain or headache.    ? bisacodyl 5 MG EC tablet Take 1 tablet (5 mg total) by mouth daily as needed for moderate constipation. 30 tablet 3  ? dexamethasone (DECADRON) 4 MG tablet Take 2 tablets (8 mg total) by mouth daily. Start the day before Taxotere. Then daily after chemo for 2 days. 30 tablet 1  ? estradiol (ESTRACE VAGINAL) 0.1 MG/GM vaginal cream Place 1 Applicatorful vaginally 3 (three) times a week. Place finger tip size amount of cream and insert slightly past the vaginal entrance 3 times weekly at bedtime. 42.5 g 0  ? lidocaine (XYLOCAINE) 2 % solution SMARTSIG:By Mouth    ? lidocaine-prilocaine (EMLA) cream Apply to affected area once 30 g 3  ? loratadine (CLARITIN) 10 MG tablet Take 10 mg by mouth daily as needed (for bone aches).    ?  LORazepam (ATIVAN) 0.5 MG tablet Take 1 tablet (0.5 mg total) by mouth 2 (two) times daily as needed for anxiety. 30 tablet 0  ? magic mouthwash (nystatin, lidocaine, diphenhydrAMINE, alum & mag  hydroxide) suspension Swish and spit 5 mLs 4 times daily. 240 mL 0  ? metoprolol succinate (TOPROL XL) 25 MG 24 hr tablet Take 1 tablet (25 mg total) by mouth at bedtime. 30 tablet 6  ? ondansetron (ZOFRAN) 8 MG tablet Take 1 tablet (8 mg total) by mouth every 8 (eight) hours as needed. 30 tablet 1  ? oxyCODONE (OXY IR/ROXICODONE) 5 MG immediate release tablet Take 1 tablet (5 mg total) by mouth every 4 (four) hours as needed for severe pain. 30 tablet 0  ? phenazopyridine (PYRIDIUM) 100 MG tablet Take 1 tablet (100 mg total) by mouth 3 (three) times daily as needed for pain. Take for two days up to three times a day 6 tablet 2  ? prochlorperazine (COMPAZINE) 10 MG tablet Take 1 tablet (10 mg total) by mouth every 6 (six) hours as needed (Nausea or vomiting). 90 tablet 1  ? ?No current facility-administered medications for this visit.  ? ?Facility-Administered Medications Ordered in Other Visits  ?Medication Dose Route Frequency Provider Last Rate Last Admin  ? sodium chloride flush (NS) 0.9 % injection 10 mL  10 mL Intracatheter PRN [name], MD   10 mL at 01/25/22 1542  ? ? 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 1. 9.5 x 7.6 x 9.0 cm complex, partially necrotic, mass involving the lower uterine segment/ cervix. No obvious direct extension into the parametrium.  ?2. 9 mm left pelvic sidewall lymph node is partially necrotic and worrisome for metastatic adenopathy.  ?3. No findings for abdominal omental or peritoneal surface disease or adenopathy.  ?4. Tiny low-attenuation lesion in the pancreatic head, likely benign cyst but attention on follow-up scans is suggested.  ?5. 2.9 cm fundal fibroid.  ?  ?06/19/2021 Pathology Results  ? FINAL MICROSCOPIC DIAGNOSIS:  ? ?A. 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  ?- See comment.  ? ?COMMENT:  ?The biopsies consist of endocervical mucosa with stromal edema and one biopsy fragment has a  microscopic focus with atypical spindle cells consistent with poorly differentiated malignancy.  The differential  ?includes a spindle cell malignancy such as sarcomatoid carcinoma and leiomyosarcoma.  Mullerian adenosarcoma is also a consideration but considered less likely ?  ?06/23/2021 Imaging  ? MR pelvis ? ?10 cm uterine mass with central necrosis, which is centered in the cervix and lower uterine segment. Right parametrial involvement is seen as well as suspected invasion of the distal rectum. Differential diagnosis includes cervical carcinoma and uterine leiomyosarcoma. ?  ?Mild bilateral iliac lymphadenopathy, highly suspicious for metastatic disease. ?  ?2.9 cm subserosal fibroid in the posterior fundus. ?  ?Normal appearance of both ovaries. ?  ?  ?06/29/2021 PET scan  ? 1. Hypermetabolic necrotic cervical/uterine mass with bilateral external iliac hypermetabolic lymph nodes. No evidence of distant metastatic disease. ?2. 1.5 cm low-attenuation left thyroid nodule. Recommend thyroid ultrasound. (Ref: J Am Coll Radiol. 2015 Feb;12(2): 143-50). ?  ?07/17/2021 Pathology Results  ? A: Uterus with cervix and bilateral ovaries and fallopian tubes, radical hysterectomy and bilateral salpingo-oophorectomy ?- Leiomyosarcoma, high grade (grade 3 / 3) with extensive epithelioid, pleomorphic, and myxoid areas and associated necrosis (~20%) ?- Tumor based in cervix and also involves lower uterine segment ?- Cervicovaginal margin involved  by focal invasive leiomyosarcoma (3:00-5:00, A10) as well as tumor in lymphovascular spaces ?- Leiomyosarcoma involves right and left parametrial tissue and extends to parametrial margins ?- Extensive lymphovascular space invasion present, including in uterus and parametria ?- See synoptic report and comment ?  ?Other findings: ?- Leiomyomata with hyalinization, size up to 3.0 cm ?- Ovaries and fallopian tubes with no parenchymal involvement by leiomyosarcoma identified, although adnexal  lymphovascular space invasion is present ?  ?B: Lymph nodes, right pelvic, lymphadenectomy ?- One of four lymph nodes positive for metastatic leiomyosarcoma (1/4), with extracapsular extension present ?

## 2022-01-25 NOTE — Telephone Encounter (Signed)
Called and left a message that magic mouth wash rx has been sent to [facility] outpatient pharmacy. The pharmacy will not mix it up until he arrives at the pharmacy. ?

## 2022-01-25 NOTE — Assessment & Plan Note (Signed)
She has persistent symptoms of cystitis despite multiple negative urine cultures ?I am wondering whether she might have vaginitis or atrophic vaginitis ?She was seen by GYN oncologist last week ?She will continue Estrace ?

## 2022-01-25 NOTE — Assessment & Plan Note (Signed)
She had intermittent pancytopenia from treatment ?She has persistent anemia chronic illness but not symptomatic ?She does not need transfusion support ?

## 2022-01-25 NOTE — Telephone Encounter (Signed)
Faxed referral to [facility] ?

## 2022-01-27 ENCOUNTER — Other Ambulatory Visit: Payer: Self-pay | Admitting: Gynecologic Oncology

## 2022-01-27 ENCOUNTER — Inpatient Hospital Stay: Payer: BC Managed Care – PPO

## 2022-01-27 ENCOUNTER — Other Ambulatory Visit: Payer: Self-pay

## 2022-01-27 ENCOUNTER — Encounter: Payer: Self-pay | Admitting: Hematology and Oncology

## 2022-01-27 DIAGNOSIS — C55 Malignant neoplasm of uterus, part unspecified: Secondary | ICD-10-CM

## 2022-01-27 DIAGNOSIS — R300 Dysuria: Secondary | ICD-10-CM

## 2022-01-27 DIAGNOSIS — C541 Malignant neoplasm of endometrium: Secondary | ICD-10-CM | POA: Diagnosis not present

## 2022-01-27 DIAGNOSIS — C7800 Secondary malignant neoplasm of unspecified lung: Secondary | ICD-10-CM

## 2022-01-27 MED ORDER — PEGFILGRASTIM-CBQV 6 MG/0.6ML ~~LOC~~ SOSY
6.0000 mg | PREFILLED_SYRINGE | Freq: Once | SUBCUTANEOUS | Status: AC
  Administered 2022-01-27: 6 mg via SUBCUTANEOUS
  Filled 2022-01-27: qty 0.6

## 2022-01-27 MED ORDER — SULFAMETHOXAZOLE-TRIMETHOPRIM 800-160 MG PO TABS: 1.0000 | ORAL_TABLET | Freq: Two times a day (BID) | ORAL | 0 refills | Status: AC

## 2022-01-27 MED ORDER — FLUCONAZOLE 150 MG PO TABS: 150.0000 mg | ORAL_TABLET | Freq: Every day | ORAL | 0 refills | Status: DC

## 2022-01-27 NOTE — Patient Instructions (Signed)

## 2022-01-27 NOTE — Telephone Encounter (Signed)
Received call from Ms. Wieck this afternoon. Patient states " I have been taking the AZO for a few days now. It has helped give me some relief but my symptoms are still persistent. I know my urine did not grow anything but it feels like a UTI. Could the antibiotic be sent in?" ? ?Patient endorses urethral itching, vulvar swelling, pain when her urine stream finishes, increased urinary frequency, urgency, a low grade fever and chills over the weekend, and urinary odor.  ? ?Advised patient that Dr. Berline Lopes will be notified and someone from the office will call and follow up wither her. Patient verbalized understanding. Verified patient allergies and pharmacy.  ? ? ?

## 2022-01-27 NOTE — Telephone Encounter (Signed)
Following up with Ms. Tammie Gilmore. Advised patient that Dr. Berline Lopes has sent in a prescription for Bactrim and a one time dose of diflucan for yeast. Instructed patient to notify our office for any persistent or worsening symptoms and if she has any questions or concerns. Patient verbalized understanding.  ?

## 2022-02-04 ENCOUNTER — Other Ambulatory Visit: Payer: Self-pay

## 2022-02-04 ENCOUNTER — Encounter: Payer: Self-pay | Admitting: Hematology and Oncology

## 2022-02-04 ENCOUNTER — Other Ambulatory Visit: Payer: Self-pay | Admitting: Hematology and Oncology

## 2022-02-04 ENCOUNTER — Inpatient Hospital Stay: Payer: BC Managed Care – PPO

## 2022-02-04 ENCOUNTER — Inpatient Hospital Stay (HOSPITAL_BASED_OUTPATIENT_CLINIC_OR_DEPARTMENT_OTHER): Payer: BC Managed Care – PPO | Admitting: Hematology and Oncology

## 2022-02-04 ENCOUNTER — Ambulatory Visit (HOSPITAL_COMMUNITY)
Admission: RE | Admit: 2022-02-04 | Discharge: 2022-02-04 | Disposition: A | Discharge status: Home / Self Care | Payer: BC Managed Care – PPO | Source: Ambulatory Visit | Attending: Hematology and Oncology | Admitting: Hematology and Oncology

## 2022-02-04 VITALS — BP 120/75 | HR 89 | Temp 98.4°F | Resp 18

## 2022-02-04 DIAGNOSIS — C55 Malignant neoplasm of uterus, part unspecified: Secondary | ICD-10-CM | POA: Insufficient documentation

## 2022-02-04 DIAGNOSIS — M8448XA Pathological fracture, other site, initial encounter for fracture: Secondary | ICD-10-CM

## 2022-02-04 DIAGNOSIS — R5381 Other malaise: Secondary | ICD-10-CM | POA: Insufficient documentation

## 2022-02-04 DIAGNOSIS — R110 Nausea: Secondary | ICD-10-CM

## 2022-02-04 DIAGNOSIS — R531 Weakness: Secondary | ICD-10-CM

## 2022-02-04 DIAGNOSIS — D61818 Other pancytopenia: Secondary | ICD-10-CM

## 2022-02-04 DIAGNOSIS — C541 Malignant neoplasm of endometrium: Secondary | ICD-10-CM | POA: Diagnosis not present

## 2022-02-04 DIAGNOSIS — C7801 Secondary malignant neoplasm of right lung: Secondary | ICD-10-CM

## 2022-02-04 HISTORY — DX: Pathological fracture, other site, initial encounter for fracture: M84.48XA

## 2022-02-04 LAB — CMP (CANCER CENTER ONLY)
ALT: 20 U/L (ref 0–44)
AST: 19 U/L (ref 15–41)
Albumin: 3.4 g/dL — ABNORMAL LOW (ref 3.5–5.0)
Alkaline Phosphatase: 131 U/L — ABNORMAL HIGH (ref 38–126)
Anion gap: 7 (ref 5–15)
BUN: 6 mg/dL (ref 6–20)
CO2: 28 mmol/L (ref 22–32)
Calcium: 8.4 mg/dL — ABNORMAL LOW (ref 8.9–10.3)
Chloride: 103 mmol/L (ref 98–111)
Creatinine: 0.75 mg/dL (ref 0.44–1.00)
GFR, Estimated: >60 mL/min (ref >60)
Glucose, Bld: 115 mg/dL — ABNORMAL HIGH (ref 70–99)
Potassium: 3.6 mmol/L (ref 3.5–5.1)
Sodium: 138 mmol/L (ref 135–145)
Total Bilirubin: 0.4 mg/dL (ref 0.3–1.2)
Total Protein: 5.7 g/dL — ABNORMAL LOW (ref 6.5–8.1)

## 2022-02-04 LAB — CBC WITH DIFFERENTIAL (CANCER CENTER ONLY)
Abs Immature Granulocytes: 7.1 10*3/uL — ABNORMAL HIGH (ref 0.00–0.07)
Basophils Absolute: 0.1 10*3/uL (ref 0.0–0.1)
Basophils Relative: 0 %
Eosinophils Absolute: 0 10*3/uL (ref 0.0–0.5)
Eosinophils Relative: 0 %
HCT: 25.8 % — ABNORMAL LOW (ref 36.0–46.0)
Hemoglobin: 8.3 g/dL — ABNORMAL LOW (ref 12.0–15.0)
Immature Granulocytes: 21 %
Lymphocytes Relative: 5 %
Lymphs Abs: 1.8 10*3/uL (ref 0.7–4.0)
MCH: 32.7 pg (ref 26.0–34.0)
MCHC: 32.2 g/dL (ref 30.0–36.0)
MCV: 101.6 fL — ABNORMAL HIGH (ref 80.0–100.0)
Monocytes Absolute: 2.5 10*3/uL — ABNORMAL HIGH (ref 0.1–1.0)
Monocytes Relative: 7 %
Neutro Abs: 23.2 10*3/uL — ABNORMAL HIGH (ref 1.7–7.7)
Neutrophils Relative %: 67 %
Platelet Count: 62 10*3/uL — ABNORMAL LOW (ref 150–400)
RBC: 2.54 MIL/uL — ABNORMAL LOW (ref 3.87–5.11)
RDW: 21.8 % — ABNORMAL HIGH (ref 11.5–15.5)
Smear Review: NORMAL
WBC Count: 34.7 10*3/uL — ABNORMAL HIGH (ref 4.0–10.5)
nRBC: 1.9 % — ABNORMAL HIGH (ref 0.0–0.2)

## 2022-02-04 LAB — SAMPLE TO BLOOD BANK

## 2022-02-04 MED ORDER — HEPARIN SOD (PORK) LOCK FLUSH 100 UNIT/ML IV SOLN: 500.0000 [IU] | Freq: Once | INTRAVENOUS | Status: DC

## 2022-02-04 MED ORDER — SODIUM CHLORIDE 0.9 % IV SOLN: Freq: Once | INTRAVENOUS | Status: AC

## 2022-02-04 MED ORDER — IOHEXOL 300 MG/ML  SOLN
100.0000 mL | Freq: Once | INTRAMUSCULAR | Status: AC | PRN
  Administered 2022-02-04: 100 mL via INTRAVENOUS

## 2022-02-04 MED ORDER — SODIUM CHLORIDE 0.9% FLUSH
10.0000 mL | Freq: Once | INTRAVENOUS | Status: AC
  Administered 2022-02-04: 10 mL

## 2022-02-04 NOTE — Assessment & Plan Note (Signed)
She has profound nausea due to recent treatment ?We will prescribe IV fluids and IV Zofran today ?

## 2022-02-04 NOTE — Assessment & Plan Note (Signed)
She had recent generalized weakness after G-CSF support ?I will consider omission of G-CSF in her next cycle of treatment ?

## 2022-02-04 NOTE — Assessment & Plan Note (Signed)
This is from her recent chemotherapy ?Observe closely for now ?She does not need transfusion support ?

## 2022-02-04 NOTE — Assessment & Plan Note (Signed)
She denies pain recently ?I recommend MRI for urgent evaluation ?

## 2022-02-04 NOTE — Patient Instructions (Signed)
Rehydration, Adult Rehydration is the replacement of body fluids, salts, and minerals (electrolytes) that are lost during dehydration. Dehydration is when there is not enough water or other fluids in the body. This happens when you lose more fluids than you take in. Common causes of dehydration include: Not drinking enough fluids. This can occur when you are ill or doing activities that require a lot of energy, especially in hot weather. Conditions that cause loss of water or other fluids, such as diarrhea, vomiting, sweating, or urinating a lot. Other illnesses, such as fever or infection. Certain medicines, such as those that remove excess fluid from the body (diuretics). Symptoms of mild or moderate dehydration may include thirst, dry lips and mouth, and dizziness. Symptoms of severe dehydration may include increased heart rate, confusion, fainting, and not urinating. For severe dehydration, you may need to get fluids through an IV at the hospital. For mild or moderate dehydration, you can usually rehydrate at home by drinking certain fluids as told by your health care provider. What are the risks? Generally, rehydration is safe. However, taking in too much fluid (overhydration) can be a problem. This is rare. Overhydration can cause an electrolyte imbalance, kidney failure, or a decrease in salt (sodium) levels in the body. Supplies needed You will need an oral rehydration solution (ORS) if your health care provider tells you to use one. This is a drink to treat dehydration. It can be found in pharmacies and retail stores. How to rehydrate Fluids Follow instructions from your health care provider for rehydration. The kind of fluid and the amount you should drink depend on your condition. In general, you should choose drinks that you prefer. If told by your health care provider, drink an ORS. Make an ORS by following instructions on the package. Start by drinking small amounts, about  cup (120  mL) every 5-10 minutes. Slowly increase how much you drink until you have taken the amount recommended by your health care provider. Drink enough clear fluids to keep your urine pale yellow. If you were told to drink an ORS, finish it first, then start slowly drinking other clear fluids. Drink fluids such as: Water. This includes sparkling water and flavored water. Drinking only water can lead to having too little sodium in your body (hyponatremia). Follow the advice of your health care provider. Water from ice chips you suck on. Fruit juice with water you add to it (diluted). Sports drinks. Hot or cold herbal teas. Broth-based soups. Milk or milk products. Food Follow instructions from your health care provider about what to eat while you rehydrate. Your health care provider may recommend that you slowly begin eating regular foods in small amounts. Eat foods that contain a healthy balance of electrolytes, such as bananas, oranges, potatoes, tomatoes, and spinach. Avoid foods that are greasy or contain a lot of sugar. In some cases, you may get nutrition through a feeding tube that is passed through your nose and into your stomach (nasogastric tube, or NG tube). This may be done if you have uncontrolled vomiting or diarrhea. Beverages to avoid  Certain beverages may make dehydration worse. While you rehydrate, avoid drinking alcohol. How to tell if you are recovering from dehydration You may be recovering from dehydration if: You are urinating more often than before you started rehydrating. Your urine is pale yellow. Your energy level improves. You vomit less frequently. You have diarrhea less frequently. Your appetite improves or returns to normal. You feel less dizzy or less light-headed.   Your skin tone and color start to look more normal. Follow these instructions at home: Take over-the-counter and prescription medicines only as told by your health care provider. Do not take sodium  tablets. Doing this can lead to having too much sodium in your body (hypernatremia). Contact a health care provider if: You continue to have symptoms of mild or moderate dehydration, such as: Thirst. Dry lips. Slightly dry mouth. Dizziness. Dark urine or less urine than normal. Muscle cramps. You continue to vomit or have diarrhea. Get help right away if you: Have symptoms of dehydration that get worse. Have a fever. Have a severe headache. Have been vomiting and the following happens: Your vomiting gets worse or does not go away. Your vomit includes blood or green matter (bile). You cannot eat or drink without vomiting. Have problems with urination or bowel movements, such as: Diarrhea that gets worse or does not go away. Blood in your stool (feces). This may cause stool to look black and tarry. Not urinating, or urinating only a small amount of very dark urine, within 6-8 hours. Have trouble breathing. Have symptoms that get worse with treatment. These symptoms may represent a serious problem that is an emergency. Do not wait to see if the symptoms will go away. Get medical help right away. Call your local emergency services (911 in the [place] Do not drive yourself to the hospital. Summary Rehydration is the replacement of body fluids and minerals (electrolytes) that are lost during dehydration. Follow instructions from your health care provider for rehydration. The kind of fluid and amount you should drink depend on your condition. Slowly increase how much you drink until you have taken the amount recommended by your health care provider. Contact your health care provider if you continue to show signs of mild or moderate dehydration. This information is not intended to replace advice given to you by your health care provider. Make sure you discuss any questions you have with your health care provider. Document Revised: 11/28/2019 Document Reviewed: 10/08/2019 Elsevier Patient  Education  2023 [employer]  

## 2022-02-04 NOTE — Assessment & Plan Note (Signed)
I have reviewed CT imaging ?Overall, she has positive response to treatment ?However, radiologist commented on new fracture on her spine ?This could have happened when she had a fall recently ?Just to be sure, we will order MRI of the lumbar spine for evaluation ?

## 2022-02-04 NOTE — Progress Notes (Signed)
[place] ?OFFICE PROGRESS NOTE ? ?Patient Care Team: ?Curlene Labrum, MD as PCP - General (Family Medicine) ?[name], RN as Oncology Nurse Navigator (Oncology) ? 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 ?Pancytopenia, acquired ([place]) ?This is from her recent chemotherapy ?Observe closely for now ?She does not need transfusion support ? ?Pathologic fracture of lumbar vertebra ?She denies pain recently ?I recommend MRI for urgent evaluation ? ?Nausea without vomiting ?She has profound nausea due to recent treatment ?We will prescribe IV fluids and IV Zofran today ? ?Generalized weakness ?She had recent generalized weakness after G-CSF support ?I will consider omission of G-CSF in her next cycle of treatment ? ?Orders Placed This Encounter  ?Procedures  ? MR Lumbar Spine W Wo Contrast  ?  Standing Status:   Future  ?  Standing Expiration Date:   02/05/2023  ?  Order Specific Question:   If indicated for the ordered procedure, I authorize the administration of contrast media per Radiology protocol  ?  Answer:   Yes  ?  Order Specific Question:   What is the patient's sedation requirement?  ?  Answer:   No Sedation  ?  Order Specific Question:   Does the patient have a pacemaker or implanted devices?  ?  Answer:   No  ?  Order Specific Question:   Use SRS Protocol?  ?  Answer:   No  ?  Order Specific Question:   Preferred imaging location?  ?  Answer:   [facility] (table limit - 550 lbs)  ? ? ?All questions were answered. The patient knows to call the clinic with any problems, questions or concerns. ?The total time spent in the appointment was 40 minutes encounter with patients including review of chart and various  tests results, discussions about plan of care and coordination of care plan ?  ?[name], MD ?02/04/2022 4:35 PM ? ?INTERVAL HISTORY: ?Please see below for problem oriented charting. ?she returns for treatment follow-up ?She is seen urgently because she has not been feeling well since her CT scan this morning ?She has severe nausea but no vomiting ?She denies pain ?She continues to have discomfort when she urinates ?No fever or chills ?She did not tolerate GCF support well ?She had general weakness after the G-CSF support ? ?REVIEW OF SYSTEMS:   ?Constitutional: Denies fevers, chills or abnormal weight loss ?Eyes: Denies blurriness of vision ?Ears, nose, mouth, throat, and face: Denies mucositis or sore throat ?Respiratory: Denies cough, dyspnea or wheezes ?Cardiovascular: Denies palpitation, chest discomfort or lower extremity swelling ?Gastrointestinal:  Denies nausea, heartburn or change in bowel habits ?Skin: Denies abnormal skin rashes ?Lymphatics: Denies new lymphadenopathy or easy bruising ?Neurological:Denies numbness, tingling or new weaknesses ?All other systems were reviewed with the patient and are negative. ? ?I have reviewed the past medical history, past surgical history, social history and family history with the patient and they are unchanged from previous note. ? ?ALLERGIES:  is allergic to doxycycline. ? ?MEDICATIONS:  ?Current Outpatient Medications  ?Medication Sig Dispense Refill  ? acetaminophen (TYLENOL) 500 MG tablet Take 1,000 mg by mouth every 6 (six) hours as needed for moderate pain or headache.    ? bisacodyl  5 MG EC tablet Take 1 tablet (5 mg total) by mouth daily as needed for moderate constipation. 30 tablet 3  ? dexamethasone (DECADRON) 4 MG tablet Take 2 tablets (8 mg total) by mouth daily. Start the day before Taxotere. Then daily after chemo for 2 days. 30 tablet 1  ? estradiol (ESTRACE VAGINAL) 0.1 MG/GM vaginal cream Place 1 Applicatorful vaginally 3 (three) times a week.  Place finger tip size amount of cream and insert slightly past the vaginal entrance 3 times weekly at bedtime. 42.5 g 0  ? fluconazole (DIFLUCAN) 150 MG tablet Take 1 tablet (150 mg total) by mouth daily. 1 tablet 0  ? lidocaine (XYLOCAINE) 2 % solution SMARTSIG:By Mouth    ? lidocaine-prilocaine (EMLA) cream Apply to affected area once 30 g 3  ? loratadine (CLARITIN) 10 MG tablet Take 10 mg by mouth daily as needed (for bone aches).    ? LORazepam (ATIVAN) 0.5 MG tablet Take 1 tablet (0.5 mg total) by mouth 2 (two) times daily as needed for anxiety. 30 tablet 0  ? magic mouthwash (nystatin, lidocaine, diphenhydrAMINE, alum & mag hydroxide) suspension Swish and spit 5 mLs 4 times daily. 240 mL 0  ? metoprolol succinate (TOPROL XL) 25 MG 24 hr tablet Take 1 tablet (25 mg total) by mouth at bedtime. 30 tablet 6  ? ondansetron (ZOFRAN) 8 MG tablet Take 1 tablet (8 mg total) by mouth every 8 (eight) hours as needed. 30 tablet 1  ? oxyCODONE (OXY IR/ROXICODONE) 5 MG immediate release tablet Take 1 tablet (5 mg total) by mouth every 4 (four) hours as needed for severe pain. 30 tablet 0  ? phenazopyridine (PYRIDIUM) 100 MG tablet Take 1 tablet (100 mg total) by mouth 3 (three) times daily as needed for pain. Take for two days up to three times a day 6 tablet 2  ? prochlorperazine (COMPAZINE) 10 MG tablet Take 1 tablet (10 mg total) by mouth every 6 (six) hours as needed (Nausea or vomiting). 90 tablet 1  ? ?No current facility-administered medications for this visit.  ? ? 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 1. 9.5 x 7.6 x 9.0 cm complex, partially necrotic, mass involving the lower uterine segment/ cervix. No obvious direct extension into the parametrium.  ?2. 9 mm left pelvic sidewall lymph node is partially necrotic and worrisome for metastatic adenopathy.  ?3. No findings for abdominal omental or peritoneal surface disease or adenopathy.  ?4. Tiny  low-attenuation lesion in the pancreatic head, likely benign cyst but attention on follow-up scans is suggested.  ?5. 2.9 cm fundal fibroid.  ?  ?06/19/2021 Pathology Results  ? FINAL MICROSCOPIC DIAGNOSIS:  ? ?A. 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  ?- See comment.  ? ?COMMENT:  ?The biopsies consist of endocervical mucosa with stromal edema and one biopsy fragment has a microscopic focus with atypical spindle cells consistent with poorly differentiated malignancy.  The differential  ?includes a spindle cell malignancy such as sarcomatoid carcinoma and leiomyosarcoma.  Mullerian adenosarcoma is also a consideration but considered less likely ?  ?06/23/2021 Imaging  ? MR pelvis ? ?10 cm uterine mass with central necrosis, which is centered in the cervix and lower uterine segment. Right parametrial involvement is seen as well as suspected invasion of the distal rectum. Differential diagnosis includes cervical carcinoma and uterine leiomyosarcoma. ?  ?Mild bilateral iliac lymphadenopathy, highly suspicious for metastatic disease. ?  ?2.9 cm subserosal  fibroid in the posterior fundus. ?  ?Normal appearance of both ovaries. ?  ?  ?06/29/2021 PET scan  ? 1. Hypermetabolic necrotic cervical/uterine mass with bilateral external iliac hypermetabolic lymph nodes. No evidence of distant metastatic disease. ?2. 1.5 cm low-attenuation left thyroid nodule. Recommend thyroid ultrasound. (Ref: J Am Coll Radiol. 2015 Feb;12(2): 143-50). ?  ?07/17/2021 Pathology Results  ? A: Uterus with cervix and bilateral ovaries and fallopian tubes, radical hysterectomy and bilateral salpingo-oophorectomy ?- Leiomyosarcoma, high grade (grade 3 / 3) with extensive epithelioid, pleomorphic, and myxoid areas and associated necrosis (~20%) ?- Tumor based in cervix and also involves lower uterine segment ?- Cervicovaginal margin involved by focal invasive leiomyosarcoma (3:00-5:00, A10) as well as tumor in lymphovascular spaces ?-  Leiomyosarcoma involves right and left parametrial tissue and extends to parametrial margins ?- Extensive lymphovascular space invasion present, including in uterus and parametria ?- See synoptic report and comm

## 2022-02-05 ENCOUNTER — Ambulatory Visit (HOSPITAL_COMMUNITY)
Admission: RE | Admit: 2022-02-05 | Discharge: 2022-02-05 | Disposition: A | Discharge status: Home / Self Care | Payer: BC Managed Care – PPO | Source: Ambulatory Visit | Attending: Hematology and Oncology | Admitting: Hematology and Oncology

## 2022-02-05 ENCOUNTER — Telehealth: Payer: Self-pay

## 2022-02-05 DIAGNOSIS — C55 Malignant neoplasm of uterus, part unspecified: Secondary | ICD-10-CM | POA: Insufficient documentation

## 2022-02-05 DIAGNOSIS — M8448XA Pathological fracture, other site, initial encounter for fracture: Secondary | ICD-10-CM | POA: Insufficient documentation

## 2022-02-05 MED ORDER — GADOBUTROL 1 MMOL/ML IV SOLN
5.0000 mL | Freq: Once | INTRAVENOUS | Status: AC | PRN
  Administered 2022-02-05: 5 mL via INTRAVENOUS

## 2022-02-05 NOTE — Telephone Encounter (Signed)
Called and schedule MRI for today at 1 pm, no prior authorization required per prior authorization. ? ?Instructed Darwin to arrive a 1230 today to [facility] for MRI. She verbalized understanding. She said, she has a history of back issues after the birth of a child in the late 67's and had a MRI then. Just FYI. ? ?She also wanted  Dr. Alvy Bimler to be aware that she had some pinkish/ red urine a couple of times during the night. ?Instructed to drink lots of fluids and I would give Dr. Alvy Bimler the message on Monday. She verbalized understanding. ?

## 2022-02-08 ENCOUNTER — Inpatient Hospital Stay (HOSPITAL_BASED_OUTPATIENT_CLINIC_OR_DEPARTMENT_OTHER): Payer: BC Managed Care – PPO | Admitting: Hematology and Oncology

## 2022-02-08 ENCOUNTER — Encounter: Payer: Self-pay | Admitting: Hematology and Oncology

## 2022-02-08 ENCOUNTER — Telehealth: Payer: Self-pay | Admitting: Radiation Oncology

## 2022-02-08 ENCOUNTER — Inpatient Hospital Stay: Payer: BC Managed Care – PPO | Attending: Gynecologic Oncology

## 2022-02-08 ENCOUNTER — Other Ambulatory Visit: Payer: Self-pay

## 2022-02-08 ENCOUNTER — Inpatient Hospital Stay: Payer: BC Managed Care – PPO

## 2022-02-08 VITALS — HR 96

## 2022-02-08 DIAGNOSIS — K5909 Other constipation: Secondary | ICD-10-CM | POA: Diagnosis not present

## 2022-02-08 DIAGNOSIS — D61818 Other pancytopenia: Secondary | ICD-10-CM | POA: Diagnosis not present

## 2022-02-08 DIAGNOSIS — C55 Malignant neoplasm of uterus, part unspecified: Secondary | ICD-10-CM

## 2022-02-08 DIAGNOSIS — Z9221 Personal history of antineoplastic chemotherapy: Secondary | ICD-10-CM | POA: Insufficient documentation

## 2022-02-08 DIAGNOSIS — C7802 Secondary malignant neoplasm of left lung: Secondary | ICD-10-CM | POA: Insufficient documentation

## 2022-02-08 DIAGNOSIS — C541 Malignant neoplasm of endometrium: Secondary | ICD-10-CM | POA: Insufficient documentation

## 2022-02-08 DIAGNOSIS — R5383 Other fatigue: Secondary | ICD-10-CM | POA: Insufficient documentation

## 2022-02-08 DIAGNOSIS — K1231 Oral mucositis (ulcerative) due to antineoplastic therapy: Secondary | ICD-10-CM | POA: Diagnosis not present

## 2022-02-08 DIAGNOSIS — C7801 Secondary malignant neoplasm of right lung: Secondary | ICD-10-CM | POA: Diagnosis not present

## 2022-02-08 DIAGNOSIS — N3090 Cystitis, unspecified without hematuria: Secondary | ICD-10-CM | POA: Insufficient documentation

## 2022-02-08 DIAGNOSIS — C7951 Secondary malignant neoplasm of bone: Secondary | ICD-10-CM | POA: Insufficient documentation

## 2022-02-08 DIAGNOSIS — K5730 Diverticulosis of large intestine without perforation or abscess without bleeding: Secondary | ICD-10-CM | POA: Insufficient documentation

## 2022-02-08 DIAGNOSIS — Z5111 Encounter for antineoplastic chemotherapy: Secondary | ICD-10-CM | POA: Diagnosis not present

## 2022-02-08 DIAGNOSIS — Z79899 Other long term (current) drug therapy: Secondary | ICD-10-CM | POA: Insufficient documentation

## 2022-02-08 DIAGNOSIS — Z7952 Long term (current) use of systemic steroids: Secondary | ICD-10-CM | POA: Insufficient documentation

## 2022-02-08 DIAGNOSIS — C7800 Secondary malignant neoplasm of unspecified lung: Secondary | ICD-10-CM

## 2022-02-08 LAB — CMP (CANCER CENTER ONLY)
ALT: 14 U/L (ref 0–44)
AST: 17 U/L (ref 15–41)
Albumin: 3.4 g/dL — ABNORMAL LOW (ref 3.5–5.0)
Alkaline Phosphatase: 113 U/L (ref 38–126)
Anion gap: 4 — ABNORMAL LOW (ref 5–15)
BUN: 6 mg/dL (ref 6–20)
CO2: 30 mmol/L (ref 22–32)
Calcium: 8.3 mg/dL — ABNORMAL LOW (ref 8.9–10.3)
Chloride: 107 mmol/L (ref 98–111)
Creatinine: 0.7 mg/dL (ref 0.44–1.00)
GFR, Estimated: >60 mL/min (ref >60)
Glucose, Bld: 130 mg/dL — ABNORMAL HIGH (ref 70–99)
Potassium: 3.1 mmol/L — ABNORMAL LOW (ref 3.5–5.1)
Sodium: 141 mmol/L (ref 135–145)
Total Bilirubin: 0.5 mg/dL (ref 0.3–1.2)
Total Protein: 5.6 g/dL — ABNORMAL LOW (ref 6.5–8.1)

## 2022-02-08 LAB — CBC WITH DIFFERENTIAL (CANCER CENTER ONLY)
Abs Immature Granulocytes: 2.01 10*3/uL — ABNORMAL HIGH (ref 0.00–0.07)
Basophils Absolute: 0.1 10*3/uL (ref 0.0–0.1)
Basophils Relative: 1 %
Eosinophils Absolute: 0 10*3/uL (ref 0.0–0.5)
Eosinophils Relative: 0 %
HCT: 28.7 % — ABNORMAL LOW (ref 36.0–46.0)
Hemoglobin: 8.7 g/dL — ABNORMAL LOW (ref 12.0–15.0)
Immature Granulocytes: 8 %
Lymphocytes Relative: 6 %
Lymphs Abs: 1.6 10*3/uL (ref 0.7–4.0)
MCH: 32.1 pg (ref 26.0–34.0)
MCHC: 30.3 g/dL (ref 30.0–36.0)
MCV: 105.9 fL — ABNORMAL HIGH (ref 80.0–100.0)
Monocytes Absolute: 1.2 10*3/uL — ABNORMAL HIGH (ref 0.1–1.0)
Monocytes Relative: 5 %
Neutro Abs: 19.6 10*3/uL — ABNORMAL HIGH (ref 1.7–7.7)
Neutrophils Relative %: 80 %
Platelet Count: 197 10*3/uL (ref 150–400)
RBC: 2.71 MIL/uL — ABNORMAL LOW (ref 3.87–5.11)
RDW: 23.2 % — ABNORMAL HIGH (ref 11.5–15.5)
WBC Count: 24.5 10*3/uL — ABNORMAL HIGH (ref 4.0–10.5)
nRBC: 1.4 % — ABNORMAL HIGH (ref 0.0–0.2)

## 2022-02-08 LAB — SAMPLE TO BLOOD BANK

## 2022-02-08 MED ORDER — SODIUM CHLORIDE 0.9% FLUSH
10.0000 mL | INTRAVENOUS | Status: DC | PRN
  Administered 2022-02-08: 10 mL

## 2022-02-08 MED ORDER — SODIUM CHLORIDE 0.9 % IV SOLN: Freq: Once | INTRAVENOUS | Status: AC

## 2022-02-08 MED ORDER — SODIUM CHLORIDE 0.9% FLUSH
10.0000 mL | Freq: Once | INTRAVENOUS | Status: AC
  Administered 2022-02-08: 10 mL

## 2022-02-08 MED ORDER — PROCHLORPERAZINE MALEATE 10 MG PO TABS
10.0000 mg | ORAL_TABLET | Freq: Once | ORAL | Status: AC
  Administered 2022-02-08: 10 mg via ORAL
  Filled 2022-02-08: qty 1

## 2022-02-08 MED ORDER — SODIUM CHLORIDE 0.9 % IV SOLN
720.0000 mg/m2 | Freq: Once | INTRAVENOUS | Status: AC
  Administered 2022-02-08: 1178 mg via INTRAVENOUS
  Filled 2022-02-08: qty 30.98

## 2022-02-08 MED ORDER — HEPARIN SOD (PORK) LOCK FLUSH 100 UNIT/ML IV SOLN
500.0000 [IU] | Freq: Once | INTRAVENOUS | Status: AC | PRN
  Administered 2022-02-08: 500 [IU]

## 2022-02-08 NOTE — Progress Notes (Signed)
[place] ?OFFICE PROGRESS NOTE ? ?Patient Care Team: ?Curlene Labrum, MD as PCP - General (Family Medicine) ?[name], RN as Oncology Nurse Navigator (Oncology) ? 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 ?Metastasis to bone [facility]) ?She has mild back pain but no neurological deficit ?I recommend radiation oncology consultation ? ?Pancytopenia, acquired ([place]) ?This is from her recent chemotherapy ?Observe closely for now ?She does not need transfusion support ? ?Orders Placed This Encounter  ?Procedures  ? Ambulatory referral to Radiation Oncology  ?  Referral Priority:   Routine  ?  Referral Type:   Consultation  ?  Referral Reason:   Specialty Services Required  ?  Requested Specialty:   Radiation Oncology  ?  Number of Visits Requested:   1  ? ? ?All questions were answered. The patient knows to call the clinic with any problems, questions or concerns. ?The total time spent in the appointment was 40 minutes encounter with patients including review of chart and various tests results, discussions about plan of care and coordination of care plan ?  ?[name], MD ?02/08/2022 3:04 PM ? ?INTERVAL HISTORY: ?Please see below for problem oriented charting. ?she returns for treatment  follow-up with her husband ?She felt better since last week after she received IV antiemetics and IV fluids ?She has back pain since a fall but not worse ?No new neurological deficits ? ?REVIEW OF SYSTEMS:   ?Constitutional: Denies fevers, chills or abnormal weight loss ?Eyes: Denies blurriness of vision ?Ears, nose, mouth, throat, and face: Denies mucositis or sore throat ?Respiratory: Denies cough, dyspnea or wheezes ?Cardiovascular: Denies palpitation, chest discomfort or lower extremity swelling ?Gastrointestinal:  Denies nausea, heartburn or change in bowel habits ?Skin: Denies abnormal skin rashes ?Lymphatics: Denies new lymphadenopathy or easy bruising ?Neurological:Denies numbness, tingling or new weaknesses ?Behavioral/Psych: Mood is stable, no new changes  ?All other systems were reviewed with the patient and are negative. ? ?I have reviewed the past medical history, past surgical history, social history and family history with the patient and they are unchanged from previous note. ? ?ALLERGIES:  is allergic to doxycycline. ? ?MEDICATIONS:  ?Current Outpatient Medications  ?Medication Sig Dispense Refill  ? acetaminophen (TYLENOL) 500 MG tablet Take 1,000 mg by mouth every 6 (six) hours as needed for moderate pain or headache.    ? bisacodyl 5 MG EC tablet Take 1 tablet (5 mg total) by mouth daily as needed for moderate constipation. 30 tablet 3  ? dexamethasone (DECADRON) 4 MG tablet Take 2 tablets (8 mg total) by mouth daily. Start the day before Taxotere. Then daily after chemo for 2 days. 30 tablet 1  ? estradiol (ESTRACE VAGINAL) 0.1 MG/GM vaginal cream Place 1 Applicatorful vaginally 3 (three) times a week. Place finger tip size amount of cream and insert slightly past  the vaginal entrance 3 times weekly at bedtime. 42.5 g 0  ? fluconazole (DIFLUCAN) 150 MG tablet Take 1 tablet (150 mg total) by mouth daily. 1 tablet 0  ? lidocaine (XYLOCAINE) 2 % solution SMARTSIG:By Mouth    ? lidocaine-prilocaine  (EMLA) cream Apply to affected area once 30 g 3  ? loratadine (CLARITIN) 10 MG tablet Take 10 mg by mouth daily as needed (for bone aches).    ? LORazepam (ATIVAN) 0.5 MG tablet Take 1 tablet (0.5 mg total) by mouth 2 (two) times daily as needed for anxiety. 30 tablet 0  ? magic mouthwash (nystatin, lidocaine, diphenhydrAMINE, alum & mag hydroxide) suspension Swish and spit 5 mLs 4 times daily. 240 mL 0  ? metoprolol succinate (TOPROL XL) 25 MG 24 hr tablet Take 1 tablet (25 mg total) by mouth at bedtime. 30 tablet 6  ? ondansetron (ZOFRAN) 8 MG tablet Take 1 tablet (8 mg total) by mouth every 8 (eight) hours as needed. 30 tablet 1  ? oxyCODONE (OXY IR/ROXICODONE) 5 MG immediate release tablet Take 1 tablet (5 mg total) by mouth every 4 (four) hours as needed for severe pain. 30 tablet 0  ? phenazopyridine (PYRIDIUM) 100 MG tablet Take 1 tablet (100 mg total) by mouth 3 (three) times daily as needed for pain. Take for two days up to three times a day 6 tablet 2  ? prochlorperazine (COMPAZINE) 10 MG tablet Take 1 tablet (10 mg total) by mouth every 6 (six) hours as needed (Nausea or vomiting). 90 tablet 1  ? ?No current facility-administered medications for this visit.  ? ?Facility-Administered Medications Ordered in Other Visits  ?Medication Dose Route Frequency Provider Last Rate Last Admin  ? gemcitabine (GEMZAR) 1,178 mg in sodium chloride 0.9 % 250 mL chemo infusion  720 mg/m2 (Order-Specific) Intravenous Once Alvy Bimler, Sumaya Riedesel, MD 187 mL/hr at 02/08/22 1444 1,178 mg at 02/08/22 1444  ? heparin lock flush 100 unit/mL  500 Units Intracatheter Once PRN Alvy Bimler, Doris Mcgilvery, MD      ? sodium chloride flush (NS) 0.9 % injection 10 mL  10 mL Intracatheter PRN [name], MD      ? ? 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 1. 9.5 x 7.6 x 9.0 cm complex, partially necrotic, mass involving the lower uterine segment/ cervix. No obvious direct extension into the parametrium.   ?2. 9 mm left pelvic sidewall lymph node is partially necrotic and worrisome for metastatic adenopathy.  ?3. No findings for abdominal omental or peritoneal surface disease or adenopathy.  ?4. Tiny low-attenuation lesion in the pancreatic head, likely benign cyst but attention on follow-up scans is suggested.  ?5. 2.9 cm fundal fibroid.  ?  ?06/19/2021 Pathology Results  ? FINAL MICROSCOPIC DIAGNOSIS:  ? ?A. 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  ?- See comment.  ? ?COMMENT:  ?The biopsies consist of endocervical mucosa with stromal edema and one biopsy fragment has a microscopic focus with atypical spindle cells consistent with poorly differentiated malignancy.  The differential  ?includes a spindle cell malignancy such as sarcomatoid carcinoma and leiomyosarcoma.  Mullerian adenosarcoma is also a consideration but considered less likely ?  ?06/23/2021 Imaging  ? MR pelvis ? ?10 cm uterine mass with central necrosis, which is centered in the cervix and lower uterine segment. Right parametrial involvement is seen as well as suspected invasion of the distal rectum. Differential diagnosis includes cervical carcinoma and uterine leiomyosarcoma. ?  ?  Mild bilateral iliac lymphadenopathy, highly suspicious for metastatic disease. ?  ?2.9 cm subserosal fibroid in the posterior fundus. ?  ?Normal appearance of both ovaries. ?  ?  ?06/29/2021 PET scan  ? 1. Hypermetabolic necrotic cervical/uterine mass with bilateral external iliac hypermetabolic lymph nodes. No evidence of distant metastatic disease. ?2. 1.5 cm low-attenuation left thyroid nodule. Recommend thyroid ultrasound. (Ref: J Am Coll Radiol. 2015 Feb;12(2): 143-50). ?  ?07/17/2021 Pathology Results  ? A: Uterus with cervix and bilateral ovaries and fallopian tubes, radical hysterectomy and bilateral salpingo-oophorectomy ?- Leiomyosarcoma, high grade (grade 3 / 3) with extensive epithelioid, pleomorphic, and myxoid areas and associated necrosis  (~20%) ?- Tumor based in cervix and also involves lower uterine segment ?- Cervicovaginal margin involved by focal invasive leiomyosarcoma (3:00-5:00, A10) as well as tumor in lymphovascular spaces ?Tammie Gilmore

## 2022-02-08 NOTE — Assessment & Plan Note (Signed)
The lung nodules are smaller ?She will continue chemotherapy for few more months with plan to repeat CT imaging in August ?

## 2022-02-08 NOTE — Assessment & Plan Note (Signed)
This is from her recent chemotherapy ?Observe closely for now ?She does not need transfusion support ?

## 2022-02-08 NOTE — Telephone Encounter (Signed)
Called patient to schedule a consultation w. Dr. Kinard. No answer, LVM for a return call.  

## 2022-02-08 NOTE — Assessment & Plan Note (Signed)
She has mild back pain but no neurological deficit ?I recommend radiation oncology consultation ?

## 2022-02-08 NOTE — Assessment & Plan Note (Signed)
I have reviewed multiple CT imaging and MRI with the patient and her husband ?Overall, she had a positive response to treatment ?The pathological fracture on her back indicated that she might have metastatic cancer to her bone that was not evident on prior CT imaging ?She is relatively asymptomatic ?I recommend we continue on systemic chemotherapy ?I will send referral to radiation oncologist for radiation to her lumbar spine/L3 area ?Once she completes radiation treatment, I will send her to interventional radiologist for kyphoplasty ?

## 2022-02-15 ENCOUNTER — Inpatient Hospital Stay: Payer: BC Managed Care – PPO

## 2022-02-15 ENCOUNTER — Inpatient Hospital Stay (HOSPITAL_BASED_OUTPATIENT_CLINIC_OR_DEPARTMENT_OTHER): Payer: BC Managed Care – PPO | Admitting: Hematology and Oncology

## 2022-02-15 ENCOUNTER — Other Ambulatory Visit: Payer: Self-pay

## 2022-02-15 ENCOUNTER — Encounter: Payer: Self-pay | Admitting: Hematology and Oncology

## 2022-02-15 VITALS — BP 116/77 | HR 94 | Temp 98.0°F | Resp 17

## 2022-02-15 VITALS — BP 131/82 | HR 109 | Temp 97.9°F | Resp 16 | Ht 68.0 in | Wt 126.4 lb

## 2022-02-15 DIAGNOSIS — K5909 Other constipation: Secondary | ICD-10-CM | POA: Diagnosis not present

## 2022-02-15 DIAGNOSIS — C55 Malignant neoplasm of uterus, part unspecified: Secondary | ICD-10-CM

## 2022-02-15 DIAGNOSIS — C7801 Secondary malignant neoplasm of right lung: Secondary | ICD-10-CM

## 2022-02-15 DIAGNOSIS — D61818 Other pancytopenia: Secondary | ICD-10-CM

## 2022-02-15 DIAGNOSIS — C7800 Secondary malignant neoplasm of unspecified lung: Secondary | ICD-10-CM

## 2022-02-15 DIAGNOSIS — C541 Malignant neoplasm of endometrium: Secondary | ICD-10-CM | POA: Diagnosis not present

## 2022-02-15 DIAGNOSIS — C7951 Secondary malignant neoplasm of bone: Secondary | ICD-10-CM

## 2022-02-15 DIAGNOSIS — C7802 Secondary malignant neoplasm of left lung: Secondary | ICD-10-CM

## 2022-02-15 LAB — CBC WITH DIFFERENTIAL (CANCER CENTER ONLY)
Abs Immature Granulocytes: 0.09 10*3/uL — ABNORMAL HIGH (ref 0.00–0.07)
Basophils Absolute: 0 10*3/uL (ref 0.0–0.1)
Basophils Relative: 0 %
Eosinophils Absolute: 0 10*3/uL (ref 0.0–0.5)
Eosinophils Relative: 0 %
HCT: 23.8 % — ABNORMAL LOW (ref 36.0–46.0)
Hemoglobin: 7.6 g/dL — ABNORMAL LOW (ref 12.0–15.0)
Immature Granulocytes: 1 %
Lymphocytes Relative: 5 %
Lymphs Abs: 0.3 10*3/uL — ABNORMAL LOW (ref 0.7–4.0)
MCH: 32.9 pg (ref 26.0–34.0)
MCHC: 31.9 g/dL (ref 30.0–36.0)
MCV: 103 fL — ABNORMAL HIGH (ref 80.0–100.0)
Monocytes Absolute: 0.1 10*3/uL (ref 0.1–1.0)
Monocytes Relative: 1 %
Neutro Abs: 6.3 10*3/uL (ref 1.7–7.7)
Neutrophils Relative %: 93 %
Platelet Count: 149 10*3/uL — ABNORMAL LOW (ref 150–400)
RBC: 2.31 MIL/uL — ABNORMAL LOW (ref 3.87–5.11)
RDW: 19.4 % — ABNORMAL HIGH (ref 11.5–15.5)
WBC Count: 6.8 10*3/uL (ref 4.0–10.5)
nRBC: 0 % (ref 0.0–0.2)

## 2022-02-15 LAB — CMP (CANCER CENTER ONLY)
ALT: 12 U/L (ref 0–44)
AST: 13 U/L — ABNORMAL LOW (ref 15–41)
Albumin: 3.3 g/dL — ABNORMAL LOW (ref 3.5–5.0)
Alkaline Phosphatase: 71 U/L (ref 38–126)
Anion gap: 8 (ref 5–15)
BUN: 10 mg/dL (ref 6–20)
CO2: 26 mmol/L (ref 22–32)
Calcium: 8.8 mg/dL — ABNORMAL LOW (ref 8.9–10.3)
Chloride: 105 mmol/L (ref 98–111)
Creatinine: 0.78 mg/dL (ref 0.44–1.00)
GFR, Estimated: >60 mL/min (ref >60)
Glucose, Bld: 198 mg/dL — ABNORMAL HIGH (ref 70–99)
Potassium: 3.7 mmol/L (ref 3.5–5.1)
Sodium: 139 mmol/L (ref 135–145)
Total Bilirubin: 0.4 mg/dL (ref 0.3–1.2)
Total Protein: 6.1 g/dL — ABNORMAL LOW (ref 6.5–8.1)

## 2022-02-15 LAB — SAMPLE TO BLOOD BANK

## 2022-02-15 LAB — PREPARE RBC (CROSSMATCH)

## 2022-02-15 MED ORDER — SODIUM CHLORIDE 0.9% FLUSH
10.0000 mL | Freq: Once | INTRAVENOUS | Status: AC
  Administered 2022-02-15: 10 mL

## 2022-02-15 MED ORDER — SODIUM CHLORIDE 0.9 % IV SOLN
720.0000 mg/m2 | Freq: Once | INTRAVENOUS | Status: AC
  Administered 2022-02-15: 1178 mg via INTRAVENOUS
  Filled 2022-02-15: qty 30.98

## 2022-02-15 MED ORDER — SODIUM CHLORIDE 0.9% IV SOLUTION
250.0000 mL | Freq: Once | INTRAVENOUS | Status: AC
  Administered 2022-02-15: 250 mL via INTRAVENOUS

## 2022-02-15 MED ORDER — SODIUM CHLORIDE 0.9 % IV SOLN
80.0000 mg/m2 | Freq: Once | INTRAVENOUS | Status: AC
  Administered 2022-02-15: 130 mg via INTRAVENOUS
  Filled 2022-02-15: qty 13

## 2022-02-15 MED ORDER — SODIUM CHLORIDE 0.9 % IV SOLN: Freq: Once | INTRAVENOUS | Status: AC

## 2022-02-15 MED ORDER — SODIUM CHLORIDE 0.9 % IV SOLN
10.0000 mg | Freq: Once | INTRAVENOUS | Status: AC
  Administered 2022-02-15: 10 mg via INTRAVENOUS
  Filled 2022-02-15: qty 10

## 2022-02-15 MED ORDER — DIPHENHYDRAMINE HCL 25 MG PO CAPS
25.0000 mg | ORAL_CAPSULE | Freq: Once | ORAL | Status: AC
  Administered 2022-02-15: 25 mg via ORAL
  Filled 2022-02-15: qty 1

## 2022-02-15 MED ORDER — ACETAMINOPHEN 325 MG PO TABS
650.0000 mg | ORAL_TABLET | Freq: Once | ORAL | Status: AC
  Administered 2022-02-15: 650 mg via ORAL
  Filled 2022-02-15: qty 2

## 2022-02-15 MED ORDER — SODIUM CHLORIDE 0.9% FLUSH
10.0000 mL | INTRAVENOUS | Status: DC | PRN
  Administered 2022-02-15: 10 mL

## 2022-02-15 MED ORDER — DEXAMETHASONE 4 MG PO TABS: 8.0000 mg | ORAL_TABLET | Freq: Every day | ORAL | 1 refills | Status: DC

## 2022-02-15 MED ORDER — HEPARIN SOD (PORK) LOCK FLUSH 100 UNIT/ML IV SOLN
500.0000 [IU] | Freq: Once | INTRAVENOUS | Status: AC | PRN
  Administered 2022-02-15: 500 [IU]

## 2022-02-15 NOTE — Assessment & Plan Note (Signed)
She is symptomatic from pancytopenia ?We will proceed with treatment with additional blood transfusion support ?We discussed some of the risks, benefits, and alternatives of blood transfusions. The patient is symptomatic from anemia and the hemoglobin level is critically low.  Some of the side-effects to be expected including risks of transfusion reactions, chills, infection, syndrome of volume overload and risk of hospitalization from various reasons and the patient is willing to proceed and went ahead to sign consent today. ? ?

## 2022-02-15 NOTE — Assessment & Plan Note (Signed)
She denies back pain.  She has severe diffuse bone pain with recent G-CSF injection ?After much discussion, she would like to consider omission of G-CSF this cycle which I think is reasonable to try ?

## 2022-02-15 NOTE — Assessment & Plan Note (Signed)
We discussed the importance of regular laxative therapy 

## 2022-02-15 NOTE — Assessment & Plan Note (Addendum)
Her last imaging study showed excellent response to treatment ?The pathological fracture on her back indicated that she might have metastatic cancer to her bone that was not evident on prior CT imaging ?She is relatively asymptomatic ?I recommend we continue on systemic chemotherapy ?I will send referral to radiation oncologist for radiation to her lumbar spine/L3 area ?Once she completes radiation treatment, I will send her to interventional radiologist for kyphoplasty ?I plan to repeat imaging study again end of July ?

## 2022-02-15 NOTE — Progress Notes (Signed)
Ok to proceed with tx today per Dr.Gorsuch with low hbg. Per Dr.Gorsuch pt will receive 1 unit of blood, ok to run at 300 ml/hr ?

## 2022-02-15 NOTE — Progress Notes (Signed)
[facility] ?OFFICE PROGRESS NOTE ? ?Patient Care Team: ?Curlene Labrum, MD as PCP - General (Family Medicine) ?[name], RN as Oncology Nurse Navigator (Oncology) ? 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 ?Pancytopenia, acquired ([place]) ?She is symptomatic from pancytopenia ?We will proceed with treatment with additional blood transfusion support ?We discussed some of the risks, benefits, and alternatives of blood transfusions. The patient is symptomatic from anemia and the hemoglobin level is critically low.  Some of the side-effects to be expected including risks of transfusion reactions, chills, infection, syndrome of volume overload and risk of hospitalization from various reasons and the patient is willing to proceed and went ahead to sign consent today. ? ? ?Other constipation ?We discussed the importance of regular laxative therapy ? ?Metastasis to bone [facility]) ?She denies back pain.  She has severe diffuse bone pain with recent G-CSF injection ?After much discussion, she would like to consider omission of G-CSF this cycle which I think is reasonable to try ? ?Orders Placed This Encounter  ?Procedures  ? Informed Consent Details: Physician/Practitioner Attestation; Transcribe to consent form and obtain patient signature  ?  Standing Status:   Future  ?  Standing Expiration Date:   02/16/2023  ?  Order Specific Question:   Physician/Practitioner attestation of  informed consent for blood and or blood product transfusion  ?  Answer:   I, the physician/practitioner, attest that I have discussed with the patient the benefits, risks, side effects, alternatives, likelihood of achieving goals and potential problems during recovery for the procedure that I have provided informed consent.  ?  Order Specific Question:   Product(s)  ?  Answer:   All Product(s)  ? Care order/instruction  ?  Transfuse Parameters  ?  Standing Status:   Future  ?  Standing Expiration Date:   02/15/2023  ? Type and screen  ?   ?  ?  Standing Status:   Future  ?  Number of Occurrences:   1  ?  Standing Expiration Date:   02/16/2023  ? Prepare RBC (crossmatch)  ?  Standing Status:   Standing  ?  Number of Occurrences:   1  ?  Order Specific Question:   # of Units  ?  Answer:   1 unit  ?  Order Specific Question:   Transfusion Indications  ?  Answer:   Symptomatic Anemia  ?  Order Specific Question:   Number of Units to Keep Ahead  ?  Answer:   NO units ahead  ?  Order Specific Question:   Instructions:  ?  Answer:   Transfuse  ?  Order Specific Question:   If emergent release call blood bank  ?  Answer:   Not emergent release  ? ? ?All questions were answered. The patient knows to call the clinic with any problems, questions or concerns. ?The total time spent in the appointment was 30 minutes encounter with patients including review of chart and various tests results, discussions about  plan of care and coordination of care plan ?  ?[name], MD ?02/15/2022 12:56 PM ? ?INTERVAL HISTORY: ?Please see below for problem oriented charting. ?she returns for treatment follow-up with her husband ?She complained of excessive fatigue ?She has severe constipation on the week of chemotherapy in general ?Denies worsening peripheral neuropathy ?The patient denies any recent signs or symptoms of bleeding such as spontaneous epistaxis, hematuria or hematochezia. ? ? ?REVIEW OF SYSTEMS:   ?Constitutional: Denies fevers, chills  or abnormal weight loss ?Eyes: Denies blurriness of vision ?Ears, nose, mouth, throat, and face: Denies mucositis or sore throat ?Respiratory: Denies cough, dyspnea or wheezes ?Cardiovascular: Denies palpitation, chest discomfort or lower extremity swelling ?Skin: Denies abnormal skin rashes ?Lymphatics: Denies new lymphadenopathy or easy bruising ?Neurological:Denies numbness, tingling or new weaknesses ?Behavioral/Psych: Mood is stable, no new changes  ?All other systems were reviewed with the patient and are negative. ? ?I have reviewed the past medical history, past surgical history, social history and family history with the patient and they are unchanged from previous note. ? ?ALLERGIES:  is allergic to doxycycline. ? ?MEDICATIONS:  ?Current Outpatient Medications  ?Medication Sig Dispense Refill  ? senna (SENOKOT) 8.6 MG TABS tablet Take 2 tablets by mouth at bedtime.    ? acetaminophen (TYLENOL) 500 MG tablet Take 1,000 mg by mouth every 6 (six) hours as needed for moderate pain or headache.    ? bisacodyl 5 MG EC tablet Take 1 tablet (5 mg total) by mouth daily as needed for moderate constipation. 30 tablet 3  ? dexamethasone (DECADRON) 4 MG tablet Take 2 tablets (8 mg total) by mouth daily. Start the day before Taxotere. Then daily after chemo for 2 days. 30 tablet 1  ? estradiol (ESTRACE VAGINAL) 0.1 MG/GM vaginal cream Place 1 Applicatorful vaginally 3 (three) times a week. Place finger tip size amount of cream and insert slightly past the vaginal entrance 3 times weekly at bedtime. 42.5 g 0  ? lidocaine (XYLOCAINE) 2 % solution SMARTSIG:By Mouth    ? lidocaine-prilocaine (EMLA) cream Apply to affected area once 30 g 3  ? loratadine (CLARITIN) 10 MG tablet Take 10 mg by mouth daily as needed (for bone aches).    ? LORazepam (ATIVAN) 0.5 MG tablet Take 1 tablet (0.5 mg total) by mouth 2 (two) times daily as needed for anxiety. 30 tablet 0  ? magic mouthwash (nystatin, lidocaine, diphenhydrAMINE, alum &  mag hydroxide) suspension Swish and spit 5 mLs 4 times daily. 240 mL 0  ? metoprolol succinate (TOPROL XL) 25 MG 24 hr tablet Take 1 tablet (25 mg total) by mouth at bedtime. 30 tablet 6  ? ondansetron (ZOFRAN) 8 MG tablet Take 1 tablet (8 mg total) by mouth every 8 (eight) hours as needed. 30 tablet 1  ? oxyCODONE (OXY IR/ROXICODONE) 5 MG immediate release tablet Take 1 tablet (5 mg total) by mouth every 4 (four) hours as needed for severe pain. 30 tablet 0  ? prochlorperazine (COMPAZINE) 10 MG tablet Take 1 tablet (10 mg total) by mouth every 6 (six) hours as needed (Nausea or vomiting). 90 tablet 1  ? ?No current facility-administered medications for this visit.  ? ?Facility-Administered Medications Ordered in Other Visits  ?Medication Dose Route Frequency Provider Last Rate Last Admin  ? dexamethasone (DECADRON) 10 mg in sodium chloride 0.9 % 50 mL IVPB  10 mg Intravenous Once [name], MD 204 mL/hr at 02/15/22 1249 10 mg at 02/15/22 1249  ? DOCEtaxel (TAXOTERE) 130 mg  in sodium chloride 0.9 % 250 mL chemo infusion  80 mg/m2 (Treatment Plan Recorded) Intravenous Once [name], MD      ? gemcitabine (GEMZAR) 1,178 mg in sodium chloride 0.9 % 250 mL chemo infusion  720 mg/m2 (Treatment Plan Recorded) Intravenous Once Alvy Bimler, Soila Printup, MD      ? heparin lock flush 100 unit/mL  500 Units Intracatheter Once PRN Alvy Bimler, Mayola Mcbain, MD      ? sodium chloride flush (NS) 0.9 % injection 10 mL  10 mL Intracatheter PRN [name], MD      ? ? 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 1. 9.5 x 7.6 x 9.0 cm complex, partially necrotic, mass involving the lower uterine segment/ cervix. No obvious direct extension into the parametrium.  ?2. 9 mm left pelvic sidewall lymph node is partially necrotic and worrisome for metastatic adenopathy.  ?3. No findings for abdominal omental or peritoneal surface disease or adenopathy.  ?4. Tiny low-attenuation lesion in the pancreatic  head, likely benign cyst but attention on follow-up scans is suggested.  ?5. 2.9 cm fundal fibroid.  ?  ?06/19/2021 Pathology Results  ? FINAL MICROSCOPIC DIAGNOSIS:  ? ?A. UTERINE, CERVICAL MASS, BIOPSY:  ?-

## 2022-02-16 LAB — TYPE AND SCREEN
ABO/RH(D): B POS
Antibody Screen: NEGATIVE
Unit division: 0

## 2022-02-16 LAB — BPAM RBC
Blood Product Expiration Date: 202305232359
ISSUE DATE / TIME: 202305081639
Unit Type and Rh: 7300

## 2022-02-17 ENCOUNTER — Inpatient Hospital Stay: Payer: BC Managed Care – PPO

## 2022-02-19 NOTE — Progress Notes (Signed)
Histology and Location of Primary Cancer:  ?Uterine leiomyosarcoma ? ?Sites of Visceral and Bony Metastatic Disease:  ?MRI Lumbar Spine w/ & w/o Contrast ?02/05/2022 ?--IMPRESSION: ?Pathologic burst fracture of L3 due to a metastatic lesion, with probable mild degree of right-sided extraosseous tumor extension in the paraspinal soft tissues, and mild involvement of the right pedicle. No epidural/spinal canal involvement. ?Multilevel degenerative disc disease without any significant stenosis in the lumbar spine. ? ?Location(s) of Symptomatic Metastases: L3 ? ?Past/Anticipated chemotherapy by medical oncology, if any:  ?Under care of Dr. 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[place]) ?Her last imaging study showed excellent response to treatment ?The pathological fracture on her back indicated that she might have metastatic cancer to her bone that was not evident on prior CT imaging ?She is relatively asymptomatic ?I recommend we continue on systemic chemotherapy ?12/07/2021 - Present: on Treatment Plan : UTERINE UNDIFFERENTIATED / LEIOMYOSARCOMA Gemcitabine D1,8 + Docetaxel D8 (900/100) q21d  ?08/18/2021 - 11/10/2021: Treatment Plan : UTERINE LEIOMYOSARCOMA Doxorubicin q21d x 6 Cycles ?I will send referral to radiation oncologist for radiation to her lumbar spine/L3 area ?Once she completes radiation treatment, I will send her to interventional radiologist for kyphoplasty ?I plan to repeat imaging study again end of July ? ?Pain on a scale of 0-10 is:  3   ? ?If Spine Met(s), symptoms, if any, include: ?Bowel/Bladder retention or incontinence (please describe): incontinence possibly from inflamation. ?Numbness or weakness in extremities (please describe): slight tingle in tips of fingers. ?Current Decadron regimen, if applicable: Just on weeks she has chemo??: Take 2 tablets (8 mg total) by mouth daily. Start the day before Taxotere. Then daily after chemo for 2 days ? ?Ambulatory status? Walker? Wheelchair?:  Ambulatory ? ?SAFETY ISSUES: ?Prior radiation?  No ?Pacemaker/ICD? No ?Possible current pregnancy?  No--hysterectomy ?Is the patient on methotrexate? No ? ?Current Complaints / other details:  Urinary incontinent and pain, slight tingle of fingers. Hemoroid Some soreness possibly related to platelets. ? ? ? ? ? ? ? ? ? ? ?

## 2022-02-21 NOTE — Progress Notes (Signed)
?Radiation Oncology         [phone] ?________________________________ ? ?Initial Outpatient Consultation ? ?Name: Tammie Gilmore MRN: [id number]  ?Date: 02/22/2022  DOB: 1966/08/16 ? ?[id number], Tammie Evener, MD  [name], MD  ? ?REFERRING PHYSICIAN: [name], MD ? ?DIAGNOSIS: The encounter diagnosis was Metastasis to bone [facility]). ? ?Metastatic uterine leiomyosarcoma; pulmonary and osseous metastasis (L3) ? ? 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 ? ?HISTORY OF PRESENT ILLNESS::Tammie Gilmore is a 56 y.o. female who is accompanied by her husband. she is seen as a courtesy of Dr. Alvy Bimler for an opinion concerning radiation therapy as part of management for her recently diagnosed osseous metastasis to L3 from uterine leiomyosarcoma primary. The patient was initially diagnosed with uterine leiomyosarcoma in September of 2022. She underwent a total hysterectomy with BSO and lymphadenectomies on 07/17/22 (Dr. Berline Lopes) which showed grade 3 uterine leiomyosarcoma based in the cervix and involving the lower uterine segment, cervicovaginal  margin, lymphovascular spaces including in the uterus and right and left parametrial tissues, and 2 left pelvic lymph nodes.  ? ?CT of the abdomen and pelvis on 08/10/21 showed interval development of left lobe pulmonary nodules, measuring up to 7 mm, and various new lymph nodes in the pelvis concerning for metastatic disease. Chest CT on 08/14/21 again showed the numerous new and enlarging pulmonary nodules scattered throughout the lungs bilaterally concerning for progressive metastatic disease to the lungs.  ? ?Accordingly, the patient proceeded with 6 cycles of chemotherapy consisting of Doxorubicin from 08/18/21 through 11/10/21 under the care of Dr. Alvy Bimler.   ? ?CT of the chest abdomen and pelvis on 11/06/21 showed findings consistent with a mixed treatment response, demonstrated by a slight increase in size of the small bilateral pulmonary nodules and an interval decrease in size of the left pelvic sidewall lymph nodes. The perirectal lymph nodes were seen as unchanged in size though with new interval hypodensity, suggestive of a treatment response and interval necrosis. The abnormal left iliac lymph node also appeared stable in the interval. Otherwise, imaging showed no evidence of new metastatic disease in the chest, abdomen, or pelvis.  ? ?Given these findings, the patient opted to proceed with further chemotherapy consisting of Gemcitabine and Docetaxel starting on 12/07/21.  ? ?CT of the chest abdomen and pelvis on 02/04/22 showed a new pathologic burst type fracture of the L3 vertebral body with probable extraosseous extension of tumor. CT also showed a decrease in size and conspicuity of the pulmonary metastases and pelvis lymph nodes, consistent with treatment response. Otherwise, no definite new metastatic lesions were identified within chest, abdomen or pelvis. MRI of the lumbar spin on 02/05/22 re-demonstrated the pathologic burst fracture of L3 due to a metastatic lesion, with a probable mild degree of right-sided extraosseous tumor extension in the paraspinal soft tissues, and mild involvement of the right pedicle. MRI showed no evidence of epidural/spinal canal involvement.  ? ?During her most recent follow-up visit with Dr. Alvy Bimler on 02/15/22, the patient was noted to be relatively asymptomatic. Given so, Dr. Alvy Bimler advised continuing on systemic therapy and referred the patient to me for consideration for RT to the L3 lesion.  ? ? ?PREVIOUS RADIATION THERAPY: No ? ?PAST MEDICAL HISTORY:  ?Past Medical History:  ?Diagnosis Date  ? Hypoglycemia   ? occasional  episodes of hypoglycemia  ? IBS (irritable bowel syndrome)   ? ? ?PAST SURGICAL HISTORY: ?Past  Surgical History:  ?Procedure Laterality Date  ? HERNIA REPAIR  1994  ? left inguinal, with mesh  ? IR IMAGING GUIDED PORT INSERTION  08/13/2021  ? ? ?FAMILY HISTORY:  ?Family History  ?Problem Relation Age of Onset  ? Heart attack Mother   ? Heart disease Mother   ? Endometriosis Mother   ? Heart disease Father   ? Heart attack Father   ? Cancer - Colon Neg Hx   ? Breast cancer Neg Hx   ? Cancer Neg Hx   ? Ovarian cancer Neg Hx   ? Uterine cancer Neg Hx   ? Pancreatic cancer Neg Hx   ? Pancreatic disease Neg Hx   ? Prostate cancer Neg Hx   ? ? ?SOCIAL HISTORY:  ?Social History  ? ?Tobacco Use  ? Smoking status: Never  ? Smokeless tobacco: Never  ?Vaping Use  ? Vaping Use: Never used  ?Substance Use Topics  ? Alcohol use: Never  ? Drug use: Never  ? ? ?ALLERGIES:  ?Allergies  ?Allergen Reactions  ? Doxycycline   ?  Dizziness, NAUSEA  ? ? ?MEDICATIONS:  ?Current Outpatient Medications  ?Medication Sig Dispense Refill  ? acetaminophen (TYLENOL) 500 MG tablet Take 1,000 mg by mouth every 6 (six) hours as needed for moderate pain or headache.    ? bisacodyl 5 MG EC tablet Take 1 tablet (5 mg total) by mouth daily as needed for moderate constipation. 30 tablet 3  ? dexamethasone (DECADRON) 4 MG tablet Take 2 tablets (8 mg total) by mouth daily. Start the day before Taxotere. Then daily after chemo for 2 days. 30 tablet 1  ? estradiol (ESTRACE VAGINAL) 0.1 MG/GM vaginal cream Place 1 Applicatorful vaginally 3 (three) times a week. Place finger tip size amount of cream and insert slightly past the vaginal entrance 3 times weekly at bedtime. 42.5 g 0  ? lidocaine (XYLOCAINE) 2 % solution SMARTSIG:By Mouth    ? lidocaine-prilocaine (EMLA) cream Apply to affected area once 30 g 3  ? loratadine (CLARITIN) 10 MG tablet Take 10 mg by mouth daily as needed (for bone aches).    ? LORazepam (ATIVAN) 0.5 MG tablet Take 1 tablet (0.5 mg total) by mouth 2 (two) times daily as needed for anxiety. 30 tablet 0  ? magic mouthwash  (nystatin, lidocaine, diphenhydrAMINE, alum & mag hydroxide) suspension Swish and spit 5 mLs 4 times daily. 240 mL 0  ? metoprolol succinate (TOPROL XL) 25 MG 24 hr tablet Take 1 tablet (25 mg total) by mouth at bedtime. 30 tablet 6  ? ondansetron (ZOFRAN) 8 MG tablet Take 1 tablet (8 mg total) by mouth every 8 (eight) hours as needed. 30 tablet 1  ? oxyCODONE (OXY IR/ROXICODONE) 5 MG immediate release tablet Take 1 tablet (5 mg total) by mouth every 4 (four) hours as needed for severe pain. 30 tablet 0  ? prochlorperazine (COMPAZINE) 10 MG tablet Take 1 tablet (10 mg total) by mouth every 6 (six) hours as needed (Nausea or vomiting). 90 tablet 1  ? senna (SENOKOT) 8.6 MG TABS tablet Take 2 tablets by mouth at bedtime.    ? ?No current facility-administered medications for this encounter.  ? ? ?REVIEW OF SYSTEMS:  A 10+ POINT REVIEW OF SYSTEMS WAS OBTAINED including neurology, dermatology, psychiatry, cardiac, respiratory, lymph, extremities, GI, GU, musculoskeletal, constitutional, reproductive, HEENT.  She reports occasional discomfort in  her lower back area but no consistent pain in this area at this time.  She denies any numbness or tingling in her extremities or weakness in her lower extremities.  She denies any problems with urinary retention or bowel issues. ?  ?PHYSICAL EXAM:  weight is 128 lb 12.8 oz (58.4 kg). Her temperature is 98.3 ?F (36.8 ?C). Her blood pressure is 111/71 and her pulse is 99. Her respiration is 19 and oxygen saturation is 100%.   ?General: Alert and oriented, in no acute distress, she is sitting in a wheelchair due to her fatigue but does ambulate around the house when at home. ?HEENT: Head is normocephalic. Extraocular movements are intact. ?Neck: Neck is supple, no palpable cervical or supraclavicular lymphadenopathy. ?Heart: Regular in rate and rhythm with no murmurs, rubs, or gallops. ?Chest: Clear to auscultation bilaterally, with no rhonchi, wheezes, or rales. ?Abdomen: Soft,  nontender, nondistended, with no rigidity or guarding. ?Extremities: No cyanosis or edema. ?Lymphatics: see Neck Exam ?Skin: No concerning lesions. ?Musculoskeletal: symmetric strength and muscle tone throughout. ?N

## 2022-02-22 ENCOUNTER — Other Ambulatory Visit: Payer: Self-pay

## 2022-02-22 ENCOUNTER — Encounter: Payer: Self-pay | Admitting: Radiation Oncology

## 2022-02-22 ENCOUNTER — Ambulatory Visit
Admission: RE | Admit: 2022-02-22 | Discharge: 2022-02-22 | Disposition: A | Discharge status: Home / Self Care | Payer: BC Managed Care – PPO | Source: Ambulatory Visit | Attending: Radiation Oncology | Admitting: Radiation Oncology

## 2022-02-22 VITALS — BP 111/71 | HR 99 | Temp 98.3°F | Resp 19 | Wt 128.8 lb

## 2022-02-22 DIAGNOSIS — C7802 Secondary malignant neoplasm of left lung: Secondary | ICD-10-CM | POA: Diagnosis not present

## 2022-02-22 DIAGNOSIS — Z90710 Acquired absence of both cervix and uterus: Secondary | ICD-10-CM | POA: Diagnosis not present

## 2022-02-22 DIAGNOSIS — Z90722 Acquired absence of ovaries, bilateral: Secondary | ICD-10-CM | POA: Diagnosis not present

## 2022-02-22 DIAGNOSIS — C7951 Secondary malignant neoplasm of bone: Secondary | ICD-10-CM | POA: Diagnosis not present

## 2022-02-22 DIAGNOSIS — Z9221 Personal history of antineoplastic chemotherapy: Secondary | ICD-10-CM | POA: Insufficient documentation

## 2022-02-22 DIAGNOSIS — E042 Nontoxic multinodular goiter: Secondary | ICD-10-CM | POA: Insufficient documentation

## 2022-02-22 DIAGNOSIS — E162 Hypoglycemia, unspecified: Secondary | ICD-10-CM | POA: Diagnosis not present

## 2022-02-22 DIAGNOSIS — C541 Malignant neoplasm of endometrium: Secondary | ICD-10-CM | POA: Insufficient documentation

## 2022-02-22 DIAGNOSIS — C7801 Secondary malignant neoplasm of right lung: Secondary | ICD-10-CM | POA: Diagnosis not present

## 2022-02-22 DIAGNOSIS — K589 Irritable bowel syndrome without diarrhea: Secondary | ICD-10-CM | POA: Insufficient documentation

## 2022-02-22 DIAGNOSIS — Z79899 Other long term (current) drug therapy: Secondary | ICD-10-CM | POA: Insufficient documentation

## 2022-03-01 ENCOUNTER — Inpatient Hospital Stay (HOSPITAL_BASED_OUTPATIENT_CLINIC_OR_DEPARTMENT_OTHER): Payer: BC Managed Care – PPO | Admitting: Hematology and Oncology

## 2022-03-01 ENCOUNTER — Other Ambulatory Visit (HOSPITAL_COMMUNITY): Payer: Self-pay

## 2022-03-01 ENCOUNTER — Other Ambulatory Visit: Payer: Self-pay

## 2022-03-01 ENCOUNTER — Encounter: Payer: Self-pay | Admitting: Hematology and Oncology

## 2022-03-01 ENCOUNTER — Inpatient Hospital Stay: Payer: BC Managed Care – PPO

## 2022-03-01 DIAGNOSIS — M8448XA Pathological fracture, other site, initial encounter for fracture: Secondary | ICD-10-CM

## 2022-03-01 DIAGNOSIS — D61818 Other pancytopenia: Secondary | ICD-10-CM

## 2022-03-01 DIAGNOSIS — C7801 Secondary malignant neoplasm of right lung: Secondary | ICD-10-CM

## 2022-03-01 DIAGNOSIS — K1231 Oral mucositis (ulcerative) due to antineoplastic therapy: Secondary | ICD-10-CM | POA: Diagnosis not present

## 2022-03-01 DIAGNOSIS — N3090 Cystitis, unspecified without hematuria: Secondary | ICD-10-CM | POA: Diagnosis not present

## 2022-03-01 DIAGNOSIS — C55 Malignant neoplasm of uterus, part unspecified: Secondary | ICD-10-CM

## 2022-03-01 DIAGNOSIS — C541 Malignant neoplasm of endometrium: Secondary | ICD-10-CM | POA: Diagnosis not present

## 2022-03-01 LAB — CBC WITH DIFFERENTIAL (CANCER CENTER ONLY)
Abs Immature Granulocytes: 0.01 10*3/uL (ref 0.00–0.07)
Basophils Absolute: 0 10*3/uL (ref 0.0–0.1)
Basophils Relative: 2 %
Eosinophils Absolute: 0 10*3/uL (ref 0.0–0.5)
Eosinophils Relative: 1 %
HCT: 30.8 % — ABNORMAL LOW (ref 36.0–46.0)
Hemoglobin: 9.5 g/dL — ABNORMAL LOW (ref 12.0–15.0)
Immature Granulocytes: 1 %
Lymphocytes Relative: 39 %
Lymphs Abs: 0.6 10*3/uL — ABNORMAL LOW (ref 0.7–4.0)
MCH: 32 pg (ref 26.0–34.0)
MCHC: 30.8 g/dL (ref 30.0–36.0)
MCV: 103.7 fL — ABNORMAL HIGH (ref 80.0–100.0)
Monocytes Absolute: 0.4 10*3/uL (ref 0.1–1.0)
Monocytes Relative: 29 %
Neutro Abs: 0.4 10*3/uL — CL (ref 1.7–7.7)
Neutrophils Relative %: 28 %
Platelet Count: 359 10*3/uL (ref 150–400)
RBC: 2.97 MIL/uL — ABNORMAL LOW (ref 3.87–5.11)
RDW: 18.6 % — ABNORMAL HIGH (ref 11.5–15.5)
WBC Count: 1.6 10*3/uL — ABNORMAL LOW (ref 4.0–10.5)
nRBC: 0 % (ref 0.0–0.2)

## 2022-03-01 LAB — CMP (CANCER CENTER ONLY)
ALT: 11 U/L (ref 0–44)
AST: 14 U/L — ABNORMAL LOW (ref 15–41)
Albumin: 2.9 g/dL — ABNORMAL LOW (ref 3.5–5.0)
Alkaline Phosphatase: 67 U/L (ref 38–126)
Anion gap: 4 — ABNORMAL LOW (ref 5–15)
BUN: 8 mg/dL (ref 6–20)
CO2: 30 mmol/L (ref 22–32)
Calcium: 8 mg/dL — ABNORMAL LOW (ref 8.9–10.3)
Chloride: 107 mmol/L (ref 98–111)
Creatinine: 0.64 mg/dL (ref 0.44–1.00)
GFR, Estimated: >60 mL/min (ref >60)
Glucose, Bld: 134 mg/dL — ABNORMAL HIGH (ref 70–99)
Potassium: 3.5 mmol/L (ref 3.5–5.1)
Sodium: 141 mmol/L (ref 135–145)
Total Bilirubin: 0.3 mg/dL (ref 0.3–1.2)
Total Protein: 5.2 g/dL — ABNORMAL LOW (ref 6.5–8.1)

## 2022-03-01 LAB — SAMPLE TO BLOOD BANK

## 2022-03-01 MED ORDER — NYSTATIN 100000 UNIT/ML MT SUSP
OROMUCOSAL | 0 refills | Status: DC
  Filled 2022-03-01: qty 240, 12d supply, fill #0

## 2022-03-01 MED ORDER — SODIUM CHLORIDE 0.9% FLUSH
10.0000 mL | Freq: Once | INTRAVENOUS | Status: AC
  Administered 2022-03-01: 10 mL

## 2022-03-01 MED ORDER — LIDOCAINE VISCOUS HCL 2 % MT SOLN
5.0000 mL | Freq: Four times a day (QID) | OROMUCOSAL | 0 refills | Status: DC
Start: 2022-03-01 — End: 2022-04-19
  Filled 2022-03-01: qty 240, 12d supply, fill #0

## 2022-03-01 NOTE — Assessment & Plan Note (Signed)
We have long discussion about symptoms of cystitis being related to menopausal related cystitis She will continue topical estrogen cream

## 2022-03-01 NOTE — Assessment & Plan Note (Signed)
G-CSF was omitted last cycle of treatment She tolerated G-CSF poorly due to bone pain Unfortunately, she is profoundly neutropenic today The patient is prone to get infection After much discussion, she is in agreement not to proceed with treatment today to allow bone marrow recovery She will proceed with radiation therapy as scheduled We will omit day 1 of gemcitabine We will proceed with day 8 of chemotherapy as scheduled in [redacted] weeks along with G-CSF support

## 2022-03-01 NOTE — Assessment & Plan Note (Signed)
She is relatively asymptomatic She will proceed with radiation therapy as scheduled

## 2022-03-01 NOTE — Assessment & Plan Note (Signed)
Her severe pancytopenia is due to effects of chemotherapy After much discussion, she is in agreement to resume G-CSF support with her next cycle of therapy due to her tendency of getting recurrent infection

## 2022-03-01 NOTE — Assessment & Plan Note (Signed)
She has symptoms of recurrent mucositis We will proceed with Magic mouthwash as needed

## 2022-03-01 NOTE — Progress Notes (Signed)
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 last cycle of treatment She tolerated G-CSF poorly due to bone pain Unfortunately, she is profoundly neutropenic today The patient is prone to get infection After much discussion, she is in agreement not to proceed with treatment today to allow bone marrow recovery She will proceed with radiation therapy as scheduled We will omit day 1 of gemcitabine We will proceed with day 8 of chemotherapy as scheduled in [redacted] weeks along with G-CSF support  Pancytopenia, acquired ([place]) Her severe pancytopenia is due to effects of chemotherapy After much discussion, she is in agreement to resume G-CSF support with her next cycle of therapy due to her tendency of getting recurrent infection  Mucositis due to antineoplastic therapy She has symptoms of recurrent mucositis We will proceed with Magic mouthwash as needed  Cystitis We have long discussion about symptoms of cystitis being related to menopausal related cystitis She will continue topical estrogen cream  Pathologic fracture of lumbar vertebra She is relatively asymptomatic She will proceed with radiation therapy as scheduled  No orders of the defined types were placed in this encounter.   All questions were answered. The patient knows to call the clinic with any problems, questions or concerns. The total time spent in the appointment was 40 minutes encounter with patients including review of chart and various tests results, discussions about plan of care and coordination of care plan   [name], MD 03/01/2022 1:14 PM  INTERVAL HISTORY: Please see below for problem oriented charting. she returns for treatment follow-up with her husband She continues to have persistent symptoms of  mucositis and cystitis No recent fever or chills Her energy level is slightly better No recent falls She denies recent back pain no new neurological deficit  REVIEW OF SYSTEMS:   Constitutional: Denies fevers, chills or abnormal weight loss Eyes: Denies blurriness of vision Respiratory: Denies cough, dyspnea or wheezes Cardiovascular: Denies palpitation, chest discomfort or lower extremity swelling Gastrointestinal:  Denies nausea, heartburn or change in bowel habits Skin: Denies abnormal skin rashes Lymphatics: Denies new lymphadenopathy or easy bruising Neurological:Denies numbness, tingling or new weaknesses Behavioral/Psych: Mood is stable, no new changes  All other systems were reviewed with the patient and are negative.  I have reviewed the past medical history, past surgical history, social history and family history with the patient and they are unchanged from previous note.  ALLERGIES:  is allergic to doxycycline.  MEDICATIONS:  Current Outpatient Medications  Medication Sig Dispense Refill   acetaminophen (TYLENOL) 500 MG tablet Take 1,000 mg by mouth every 6 (six) hours as needed for moderate pain or headache.     bisacodyl 5 MG EC tablet Take 1 tablet (5 mg total) by mouth daily as needed for moderate constipation. 30 tablet 3   dexamethasone (DECADRON) 4 MG tablet Take 2 tablets (8 mg total) by mouth daily. Start the day before Taxotere. Then daily after chemo for 2 days. 30 tablet 1   estradiol (ESTRACE VAGINAL) 0.1 MG/GM vaginal cream Place 1 Applicatorful vaginally 3 (three) times a week. Place finger tip size amount of cream and insert slightly past the vaginal entrance 3 times weekly at bedtime. 42.5 g 0   lidocaine (XYLOCAINE) 2 % solution SMARTSIG:By Mouth     lidocaine-prilocaine (EMLA) cream Apply to affected  area once 30 g 3   loratadine (CLARITIN) 10 MG tablet Take 10 mg by mouth daily as needed (for bone aches).     LORazepam (ATIVAN) 0.5 MG tablet Take 1  tablet (0.5 mg total) by mouth 2 (two) times daily as needed for anxiety. 30 tablet 0   magic mouthwash (nystatin, diphenhydrAMINE, alum & mag hydroxide) suspension mixture Swish and spit 5 mLs 4 times daily. 240 mL 0   magic mouthwash (nystatin, lidocaine, diphenhydrAMINE, alum & mag hydroxide) suspension Swish and spit 5 mLs 4 times daily. 240 mL 0   metoprolol succinate (TOPROL XL) 25 MG 24 hr tablet Take 1 tablet (25 mg total) by mouth at bedtime. 30 tablet 6   ondansetron (ZOFRAN) 8 MG tablet Take 1 tablet (8 mg total) by mouth every 8 (eight) hours as needed. 30 tablet 1   oxyCODONE (OXY IR/ROXICODONE) 5 MG immediate release tablet Take 1 tablet (5 mg total) by mouth every 4 (four) hours as needed for severe pain. 30 tablet 0   prochlorperazine (COMPAZINE) 10 MG tablet Take 1 tablet (10 mg total) by mouth every 6 (six) hours as needed (Nausea or vomiting). 90 tablet 1   senna (SENOKOT) 8.6 MG TABS tablet Take 2 tablets by mouth at bedtime.     No current facility-administered medications for this visit.    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 9.5 x 7.6 x 9.0 cm complex, partially necrotic, mass involving the lower uterine segment/ cervix. No obvious direct extension into the parametrium.  2. 9 mm left pelvic sidewall lymph node is partially necrotic and worrisome for metastatic adenopathy.  3. No findings for abdominal omental or peritoneal surface disease or adenopathy.  4. Tiny low-attenuation lesion in the pancreatic head, likely benign cyst but attention on follow-up scans is suggested.  5. 2.9 cm fundal fibroid.    06/19/2021 Pathology Results   FINAL MICROSCOPIC DIAGNOSIS:   A. UTERINE, CERVICAL MASS, BIOPSY:  - Spindle cell malignancy.  - See comment.   COMMENT:  The biopsies consist of endocervical mucosa with stromal edema and one biopsy fragment has a microscopic focus with atypical spindle cells consistent  with poorly differentiated malignancy.  The differential  includes a spindle cell malignancy such as sarcomatoid carcinoma and leiomyosarcoma.  Mullerian adenosarcoma is also a consideration but considered less likely   06/23/2021 Imaging   MR pelvis  10 cm uterine mass with central necrosis, which is centered in the cervix and lower uterine segment. Right parametrial involvement is seen as well as suspected invasion of the distal rectum. Differential diagnosis includes cervical carcinoma and uterine leiomyosarcoma.   Mild bilateral iliac lymphadenopathy, highly suspicious for metastatic disease.   2.9 cm subserosal fibroid in the posterior fundus.   Normal appearance of both ovaries.     06/29/2021 PET scan   1. Hypermetabolic necrotic cervical/uterine mass with bilateral external iliac hypermetabolic lymph nodes. No evidence of distant metastatic disease. 2. 1.5 cm low-attenuation left thyroid nodule. Recommend thyroid ultrasound. (Ref: J Am Coll Radiol. 2015 Feb;12(2): 143-50).   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, pleomorphic, and myxoid areas and associated necrosis (~20%) - Tumor based in cervix and also involves lower uterine segment - Cervicovaginal margin involved by focal invasive leiomyosarcoma (3:00-5:00, A10) as well as tumor in lymphovascular spaces -  Leiomyosarcoma involves right and left parametrial tissue and extends to parametrial margins - Extensive lymphovascular space invasion present, including in uterus and parametria - See synoptic report and comment   Other findings: - Leiomyomata with hyalinization, size up to 3.0 cm - Ovaries and fallopian tubes with no parenchymal involvement by leiomyosarcoma identified, although adnexal lymphovascular space invasion is present   B: Lymph  nodes, right pelvic, lymphadenectomy - One of four lymph nodes positive for metastatic leiomyosarcoma (1/4), with extracapsular extension present   C: Lymph nodes, left pelvic, lymphadenectomy - One of four lymph nodes positive for metastatic leiomyosarcoma (1/4), with extracapsular extension present  Immunohistochemical stains are performed on block A11, and demonstrate that the tumor is positive for desmin and CD10, with SMA staining the majority of the spindle cell component but largely negative in the epithelioid / pleomorphic component. OSCAR, pancytokeratin AE1/AE3, HMB45, and PR appear negative in the tumor. ER shows patchy weak staining and myogenin stains rare cells. Block A23 also shows positive desmin and negative OSCAR pancytokeratin. Overall, the findings are most consistent with leiomyosarcoma, with extensive areas that are myxoid, epithelioid, and pleomorphic as well as more typical spindle cell areas within the overall high grade tumor (grade 3 / 3). The tumor is staged as pT2b (involves other pelvic tissues) given the parametrial involvement and pN1 for FIGO stage IIIC.    07/17/2021 Surgery   Date of Surgery: 07/17/21  Preoperative Diagnosis: High Grade Uterine Sarcoma  Postoperative Diagnosis: Same  Procedure(s): Bilateral - RADICAL ABDOMINAL HYSTER, W/BIL TOTAL PELVIC LYMPHADENECTOMY & PARA-AORTIC LYMPH NODE BX W/WO REM TUBE/OVAR VAGINAL HYSTERECTOMY, FOR UTERUS 250 G OR LESS; WITH REPAIR OF ENTEROCELE COLECTOMY, PARTIAL; WITH COLOPROCTOSTOMY (LOW PELVIC ANASTOMOSIS) WITH COLOSTOMY CYSTOURETHROSCOPY, WITH INSERTION OF INDWELLING URETERAL STENT (EG, GIBBONS OR DOUBLE-J TYPE) - Cystourethroscopy - Bilateral ureteral stent placement - Foley catheter placement  Performing Service: Gynecology Oncology Surgeon(s) and Role: Panel 1: * Lafonda Mosses, MD - Primary * Bernadene Bell, MD - Resident - Assisting * Devonne Doughty, MD - Resident - Assisting Panel 2: *  Franchot Erichsen, MD - Primary  Drains:  - Left 6Fr open-ended ureteral access catheter (green) - Right 5Fr open-ended ureteral access catheter (white) - 16Fr foley catheter to drainage  * No implants in log *  Indications: 56 y.o. female with high grade uterine sarcoma. Urology was consulted pre-operatively for placement of bilateral ureteral stents. Risks, benefits, and alternatives of the above procedure were discussed and informed consent was signed.  OperativeFindings:  - Grossly distorted architecture of urinary bladder likely 2/2 pelvic mass with anterolaterally positioned UOs - Successful placement of bilateral open-ended ureteral catheters under direct visualization - Foley catheter placed at case conclusion  Description: The patient was correctly identified in the preop holding area where written informed consent as well potential risk and complication reviewed. She agreed. The patient was brought to the operative suite where a preinduction timeout was performed. Once correct information was verified, general anesthesia was induced. The patient was then gently placed into dorsal lithotomy position with SCDs in place for VTE prophylaxis. They were prepped and draped in the usual sterile fashion and given appropriate preoperative antibiotics. A second timeout was then performed.   We inserted a 75F rigid cystoscope per urethra with copious lubrication and normal saline irrigation running. We performed cystourethroscopy, which revealed the above findings.  We turned our attention to the left ureteral orifice and canulated it with a sensor wire, using assistance of a 6Fr open-ended  catheter. The wire was advanced into the renal pelvis without difficulty under visual guidance. We then advanced the stent over our wire into the renal pelvis under direct visualization and feel without complication. The wire was subsequently removed.   We then turned our attention to the right ureteral orifice  and canulated it with a sensor wire, using assistance of a 5Fr open-ended catheter. The wire was advanced into the renal pelvis without difficulty under visual guidance. We then advanced the stent over our wire into the renal pelvis under direct visualization and feel without complication. The wire was subsequently removed.   A 16Fr straight catheter was placed, with return of urine indicating appropriate position within the bladder. The balloon was inflated with 10cc sterile water. The stents were secured to the Foley using 0-silk ties, being careful not to occlude the stents or Foley.   The patient was awoken from general anesthesia having tolerated the procedure well and taken to the PACU for routine post-operative recovery.  Post-Op Plan:  - Foley and stents per primary team    07/17/2021 Surgery   Date of Surgery: 07/17/2021  Pre-op Diagnosis: Uterine spindle cell malignancy  Post-op Diagnosis: Same  Procedure(s): Panel 1 RADICAL ABDOMINAL HYSTER, with bilateral S&O, vagineconty upper, bilateral pelvic lyphadenectomy, bilateral ureterolysis,: 16109 (CPT) Panel 2 CYSTOURETHROSCOPY, WITH INSERTION OF INDWELLING URETERAL STENT (EG, GIBBONS OR DOUBLE-J TYPE): 60454 (CPT) Note: Revisions to procedures should be made in chart - see Procedures activity.  Performing Service: Gynecology Oncology Surgeon(s) and Role: Panel 1: * Lafonda Mosses, MD - Primary * Bernadene Bell, MD - Resident - Assisting * Devonne Doughty, MD - Resident - Assisting Panel 2: * Franchot Erichsen, MD - Primary  Findings: On bimanual exam, 10cm necrotic mass filling upper vagina, unable to discretely palpate the cervix. On rectovaginal exam, rectal involvement not identified. Intraoperatively, normal upper abdominal survey including normal liver, diaphragm, stomach, omentum and bowel. Small uterus with 10cm mass expanding the cervix. Palpably enlarged bilateral pelvic lymph nodes adherent to the external  iliac veins and obturator nerves, removed. No palpable para-aortic lymphadenopathy. No rectal involvement of uterine mass.   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 Interval development of left lobe pulmonary nodules, measuring up to 7 mm and highly for metastatic disease. 2. Interval development of small to upper normal lymph nodes in the pelvis, concerning for metastatic disease. 3. Postoperative seroma left pelvic sidewall. 4. Tiny cluster of tree-in-bud opacity in the peripheral right lower lobe is new and compatible with sequelae of atypical infection.   08/13/2021 Procedure   Procedure: Placement of a right IJ approach single lumen PowerPort.  Tip is positioned at the superior cavoatrial junction and catheter is ready for immediate use.  Complications: No immediate   08/14/2021 Echocardiogram  1. Left ventricular ejection fraction, by estimation, is 60 to 65%. The left ventricle has normal function. The left ventricle has no regional wall motion abnormalities. Left ventricular diastolic parameters were normal. The average left ventricular global longitudinal strain is -17.4 %. The global longitudinal strain is normal.  2. Right ventricular systolic function is normal.  The right ventricular size is normal.  3. The mitral valve is normal in structure. No evidence of mitral valve regurgitation. No evidence of mitral stenosis.  4. The aortic valve is tricuspid. Aortic valve regurgitation is not visualized. No aortic stenosis is present.  5. The inferior vena cava is normal in size with greater than 50% respiratory variability, suggesting right atrial pressure of 3 mmHg.     08/17/2021 Imaging   Multiple new and enlarging pulmonary nodules scattered throughout the lungs bilaterally, highly concerning for progressive metastatic disease to the lungs   08/18/2021 - 11/10/2021 Chemotherapy   Patient is on Treatment Plan : UTERINE LEIOMYOSARCOMA Doxorubicin q21d x 6 Cycles      11/09/2021 Imaging   IMPRESSION: 1. Multiple small bilateral pulmonary nodules, some of which are slightly increased in size. Other nodules unchanged. 2. Interval decrease in size of left pelvic sidewall lymph nodes. 3. Unchanged size of perirectal lymph nodes or soft tissue nodules. These however demonstrate new internal hypodensity, suggesting treatment response and internal necrosis. 4. Unchanged left iliac lymph node or peritoneal nodule. 5. Findings are consistent with mixed response to treatment. No evidence of new metastatic disease in the chest, abdomen, or pelvis. 6. Wall thickening and mucosal hyperenhancement of the bladder, consistent with nonspecific infectious or inflammatory cystitis. Correlate with urinalysis. 7. Status post hysterectomy and oophorectomy. Interval resolution of a previously noted left pelvic hematoma or seroma. 8. Trace, nonspecific free fluid in the low pelvis.   11/30/2021 Echocardiogram    1. Left ventricular ejection fraction, by estimation, is 40 to 45%. Left ventricular ejection fraction by 3D volume is 41 %. The left ventricle has mildly decreased function. The left ventricle has no regional wall motion abnormalities. Left ventricular  diastolic parameters  are consistent with Grade I diastolic dysfunction (impaired relaxation).  2. Right ventricular systolic function is moderately reduced. The right ventricular size is normal.  3. The mitral valve is grossly normal. No evidence of mitral valve regurgitation.  4. The aortic valve is normal in structure. Aortic valve regurgitation is not visualized. No aortic stenosis is present.     12/07/2021 -  Chemotherapy   Patient is on Treatment Plan : UTERINE UNDIFFERENTIATED / LEIOMYOSARCOMA Gemcitabine D1,8 + Docetaxel D8 (900/100) q21d      02/05/2022 Imaging   Pathologic burst fracture of L3 due to a metastatic lesion, with probable mild degree of right-sided extraosseous tumor extension in the paraspinal soft tissues, and mild involvement of the right pedicle. No epidural/spinal canal involvement.   Multilevel degenerative disc disease without any significant stenosis in the lumbar spine.   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 Multiple small bilateral pulmonary nodules, some of which are slightly increased in size. Other nodules unchanged. 2. Interval decrease in size of left pelvic sidewall lymph nodes. 3. Unchanged size of perirectal lymph nodes or soft tissue nodules. These however demonstrate new internal hypodensity, suggesting treatment response and  internal necrosis. 4. Unchanged left iliac lymph node or peritoneal nodule. 5. Findings are consistent with mixed response to treatment. No evidence of new metastatic disease in the chest, abdomen, or pelvis. 6. Wall thickening and mucosal hyperenhancement of the bladder, consistent with nonspecific infectious or inflammatory cystitis. Correlate with urinalysis. 7. Status post hysterectomy and oophorectomy. Interval  resolution of a previously noted left pelvic hematoma or seroma. 8. Trace, nonspecific free fluid in the low pelvis.   12/07/2021 -  Chemotherapy   Patient is on Treatment Plan : UTERINE UNDIFFERENTIATED / LEIOMYOSARCOMA Gemcitabine D1,8 + Docetaxel D8 (900/100) q21d        PHYSICAL EXAMINATION: ECOG PERFORMANCE STATUS: 1 - Symptomatic but completely ambulatory  Vitals:   03/01/22 1154  BP: 110/82  Pulse: 90  Resp: 18  Temp: 98 F (36.7 C)  SpO2: 100%   Filed Weights   03/01/22 1154  Weight: 130 lb 12.8 oz (59.3 kg)    GENERAL:alert, no distress and comfortable NEURO: alert & oriented x 3 with fluent speech, no focal motor/sensory deficits  LABORATORY DATA:  I have reviewed the data as listed    Component Value Date/Time   NA 141 03/01/2022 1133   K 3.5 03/01/2022 1133   CL 107 03/01/2022 1133   CO2 30 03/01/2022 1133   GLUCOSE 134 (H) 03/01/2022 1133   BUN 8 03/01/2022 1133   CREATININE 0.64 03/01/2022 1133   CALCIUM 8.0 (L) 03/01/2022 1133   PROT 5.2 (L) 03/01/2022 1133   ALBUMIN 2.9 (L) 03/01/2022 1133   AST 14 (L) 03/01/2022 1133   ALT 11 03/01/2022 1133   ALKPHOS 67 03/01/2022 1133   BILITOT 0.3 03/01/2022 1133   GFRNONAA >60 03/01/2022 1133    No results found for: SPEP, UPEP  Lab Results  Component Value Date   WBC 1.6 (L) 03/01/2022   NEUTROABS 0.4 (LL) 03/01/2022   HGB 9.5 (L) 03/01/2022   HCT 30.8 (L) 03/01/2022   MCV 103.7 (H) 03/01/2022   PLT 359 03/01/2022      Chemistry      Component Value Date/Time   NA 141 03/01/2022 1133   K 3.5 03/01/2022 1133   CL 107 03/01/2022 1133   CO2 30 03/01/2022 1133   BUN 8 03/01/2022 1133   CREATININE 0.64 03/01/2022 1133      Component Value Date/Time   CALCIUM 8.0 (L) 03/01/2022 1133   ALKPHOS 67 03/01/2022 1133   AST 14 (L) 03/01/2022 1133   ALT 11 03/01/2022 1133   BILITOT 0.3 03/01/2022 1133       RADIOGRAPHIC STUDIES: I have personally reviewed the radiological images as listed and  agreed with the findings in the report. MR Lumbar Spine W Wo Contrast  Result Date: 02/05/2022 CLINICAL DATA:  Musculoskeletal neoplasm, staging Metastatic disease evaluation EXAM: MRI LUMBAR SPINE WITHOUT AND WITH CONTRAST TECHNIQUE: Multiplanar and multiecho pulse sequences of the lumbar spine were obtained without and with intravenous contrast. CONTRAST:  7m GADAVIST GADOBUTROL 1 MMOL/ML IV SOLN COMPARISON:  None. FINDINGS: Segmentation:  Standard Alignment:  Normal Vertebrae: There is an enhancing T1 hypointense/T2 hyperintense lesion with area of non enhancement suggesting necrosis in the L3 vertebral body, with associated pathologic burst fracture, up to 25% height loss. Probable mild extraosseous extension. No significant bony retropulsion. Mild involvement of the right pedicle. Conus medullaris and cauda equina: Conus extends to the L1 level. Conus and cauda equina appear normal. Paraspinal and other soft tissues: No other significant findings. Disc levels: T12-L1: No significant  spinal canal or neural foraminal narrowing. L1-L2: Disc height loss with minimal disc bulging. No significant stenosis. L2-L3: Disc height loss with minimal disc bulging. No significant stenosis. L3-L4: Disc height loss with minimal disc bulging. No significant stenosis. L4-L5: Disc desiccation with minimal disc bulging. No significant stenosis. L5-S1: Disc desiccation with endplate spurring and minimal disc bulging. No significant stenosis. IMPRESSION: Pathologic burst fracture of L3 due to a metastatic lesion, with probable mild degree of right-sided extraosseous tumor extension in the paraspinal soft tissues, and mild involvement of the right pedicle. No epidural/spinal canal involvement. Multilevel degenerative disc disease without any significant stenosis in the lumbar spine. Electronically Signed   By: Maurine Simmering M.D.   On: 02/05/2022 14:04   CT CHEST ABDOMEN PELVIS W CONTRAST  Result Date: 02/04/2022 CLINICAL DATA:   Leiomyosarcoma status post hysterectomy ongoing chemotherapy assess treatment response. * Tracking Code: BO * EXAM: CT CHEST, ABDOMEN, AND PELVIS WITH CONTRAST TECHNIQUE: Multidetector CT imaging of the chest, abdomen and pelvis was performed following the standard protocol during bolus administration of intravenous contrast. RADIATION DOSE REDUCTION: This exam was performed according to the departmental dose-optimization program which includes automated exposure control, adjustment of the mA and/or kV according to patient size and/or use of iterative reconstruction technique. CONTRAST:  127m OMNIPAQUE IOHEXOL 300 MG/ML  SOLN COMPARISON:  Multiple priors including most recent CT November 06, 2021. FINDINGS: CT CHEST FINDINGS Cardiovascular: Right chest wall Port-A-Cath with the tip in the right atrium. Normal caliber thoracic aorta. No central pulmonary embolus on this nondedicated study. Normal size heart. No significant pericardial effusion/thickening. Mediastinum/Nodes: No supraclavicular adenopathy. Subcentimeter bilateral hypodense thyroid nodules. Not clinically significant; no follow-up imaging recommended (ref: J Am Coll Radiol. 2015 Feb;12(2): 143-50).No pathologically enlarged mediastinal, hilar or axillary lymph nodes. Lungs/Pleura: Interval decrease in size and conspicuity of the indexed and non indexed scattered bilateral metastatic pulmonary nodules. No new suspicious pulmonary nodules or masses identified. Index pulmonary nodules are as follows: -subpleural nodule in the medial right apex measures 9 x 6 mm on image 45/4 previously 15 x 13 mm. -medial left lower lobe pulmonary nodule measures 5 mm on image 111/4 previously 7 mm No focal airspace consolidation. No pleural effusion. No pneumothorax. Musculoskeletal: No chest wall mass. CT ABDOMEN PELVIS FINDINGS Hepatobiliary: No suspicious hepatic lesion. Gallbladder is unremarkable. No biliary ductal dilation. Pancreas: No pancreatic ductal dilation or  evidence of acute inflammation. Spleen: No splenomegaly or focal splenic lesion. Adrenals/Urinary Tract: Bilateral adrenal glands appear normal. No hydronephrosis. Kidneys demonstrate symmetric enhancement and excretion of contrast material. Similar urinary bladder wall thickening with mucosal hyperenhancement for instance on image 119/2. Stomach/Bowel: Radiopaque enteric contrast material traverses the cecum. Stomach is distended without abnormal wall thickening. No pathologic dilation of small or large bowel. Left-sided colonic diverticulosis without findings of acute diverticulitis. Mild wall thickening of a nondistended sigmoid colon and rectum with some adjacent fat stranding. Vascular/Lymphatic: Normal caliber abdominal aorta. Prior right pelvic sidewall lymph node dissection. Decreased size of the pelvic lymph nodes. No pathologically enlarged abdominal lymph nodes. Previously indexed pelvic lymph nodes are as follows: -Left pelvic sidewall lymph measures 10 x 8 mm on image 113/2 previously 11 x 9 mm. -perirectal lymph node measures 9 x 6 mm on image 114/2 previously 10 x 8 mm. -peritoneal nodule or iliac lymph node in the left lower quadrant adjacent to the external iliac artery and sigmoid colon measures 3 mm on image 109/2 previously 8 mm. Reproductive: Surgical changes of hysterectomy and oophorectomy without  suspicious enhancing nodularity along the vaginal cuff. Other: Trace pelvic free fluid. Postsurgical change in the anterior abdominal wall. 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 Subtle sclerotic foci in the left iliac bone and bilateral acetabula on image 101/2, 106/2 and 108/2 are stable dating back to at least PET-CT June 29, 2021 and were not hypermetabolic on that imaging consistent with a benign finding. IMPRESSION: 1. 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 No convincing evidence of additional osseous metastatic disease. 2. Interval decrease in size and conspicuity of the pulmonary metastases and pelvic lymph nodes, consistent with treatment response. 3. No definite new metastatic lesions identified within chest, abdomen or pelvis. 4. Similar urinary bladder wall thickening with mucosal hyperenhancement and adjacent fat, possibly reflecting treatment related cystitis. 5. Wall thickening of a nondistended sigmoid colon and rectum with some adjacent fat stranding. Findings are suggestive but possibly reflect treatment-related proctocolitis. These results will be called to the ordering clinician or representative by the Radiologist Assistant, and communication documented in the PACS or [employer]. Electronically Signed   By: Dahlia Bailiff M.D.   On: 02/04/2022 16:22

## 2022-03-01 NOTE — Progress Notes (Signed)
CRITICAL VALUE STICKER  CRITICAL VALUE: ANC 0.4  RECEIVER (on-site recipient of call): Harrel Lemon, RN  DATE & TIME NOTIFIED: 03/01/22 at 1152  MESSENGER (representative from lab): Lawana Pai  MD NOTIFIED: Dr. Alvy Bimler  TIME OF NOTIFICATION:03/01/22 at  1156  RESPONSE: Will see in the office.

## 2022-03-02 ENCOUNTER — Other Ambulatory Visit: Payer: Self-pay

## 2022-03-02 ENCOUNTER — Ambulatory Visit
Admission: RE | Admit: 2022-03-02 | Discharge: 2022-03-02 | Disposition: A | Discharge status: Home / Self Care | Payer: BC Managed Care – PPO | Source: Ambulatory Visit | Attending: Radiation Oncology | Admitting: Radiation Oncology

## 2022-03-02 DIAGNOSIS — C541 Malignant neoplasm of endometrium: Secondary | ICD-10-CM | POA: Diagnosis not present

## 2022-03-02 DIAGNOSIS — Z510 Encounter for antineoplastic radiation therapy: Secondary | ICD-10-CM | POA: Insufficient documentation

## 2022-03-02 DIAGNOSIS — C7951 Secondary malignant neoplasm of bone: Secondary | ICD-10-CM | POA: Insufficient documentation

## 2022-03-03 DIAGNOSIS — C7951 Secondary malignant neoplasm of bone: Secondary | ICD-10-CM | POA: Diagnosis not present

## 2022-03-10 ENCOUNTER — Other Ambulatory Visit: Payer: Self-pay

## 2022-03-10 ENCOUNTER — Ambulatory Visit
Admission: RE | Admit: 2022-03-10 | Discharge: 2022-03-10 | Disposition: A | Discharge status: Home / Self Care | Payer: BC Managed Care – PPO | Source: Ambulatory Visit | Attending: Radiation Oncology | Admitting: Radiation Oncology

## 2022-03-10 DIAGNOSIS — C7951 Secondary malignant neoplasm of bone: Secondary | ICD-10-CM | POA: Diagnosis not present

## 2022-03-10 LAB — RAD ONC ARIA SESSION SUMMARY
Course Elapsed Days: 0
Plan Fractions Treated to Date: 1
Plan Prescribed Dose Per Fraction: 3 Gy
Plan Total Fractions Prescribed: 10
Plan Total Prescribed Dose: 30 Gy
Reference Point Dosage Given to Date: 3 Gy
Reference Point Session Dosage Given: 3 Gy
Session Number: 1

## 2022-03-11 ENCOUNTER — Ambulatory Visit
Admission: RE | Admit: 2022-03-11 | Discharge: 2022-03-11 | Disposition: A | Discharge status: Home / Self Care | Payer: BC Managed Care – PPO | Source: Ambulatory Visit | Attending: Radiation Oncology | Admitting: Radiation Oncology

## 2022-03-11 ENCOUNTER — Other Ambulatory Visit: Payer: Self-pay

## 2022-03-11 DIAGNOSIS — D61818 Other pancytopenia: Secondary | ICD-10-CM | POA: Diagnosis not present

## 2022-03-11 DIAGNOSIS — C7951 Secondary malignant neoplasm of bone: Secondary | ICD-10-CM | POA: Insufficient documentation

## 2022-03-11 DIAGNOSIS — K1231 Oral mucositis (ulcerative) due to antineoplastic therapy: Secondary | ICD-10-CM | POA: Diagnosis not present

## 2022-03-11 DIAGNOSIS — Z7952 Long term (current) use of systemic steroids: Secondary | ICD-10-CM | POA: Diagnosis not present

## 2022-03-11 DIAGNOSIS — Z90710 Acquired absence of both cervix and uterus: Secondary | ICD-10-CM | POA: Diagnosis not present

## 2022-03-11 DIAGNOSIS — N3090 Cystitis, unspecified without hematuria: Secondary | ICD-10-CM | POA: Diagnosis not present

## 2022-03-11 DIAGNOSIS — C55 Malignant neoplasm of uterus, part unspecified: Secondary | ICD-10-CM | POA: Insufficient documentation

## 2022-03-11 DIAGNOSIS — Z510 Encounter for antineoplastic radiation therapy: Secondary | ICD-10-CM | POA: Insufficient documentation

## 2022-03-11 DIAGNOSIS — Z79899 Other long term (current) drug therapy: Secondary | ICD-10-CM | POA: Diagnosis not present

## 2022-03-11 DIAGNOSIS — Z5111 Encounter for antineoplastic chemotherapy: Secondary | ICD-10-CM | POA: Diagnosis present

## 2022-03-11 DIAGNOSIS — C7800 Secondary malignant neoplasm of unspecified lung: Secondary | ICD-10-CM | POA: Diagnosis not present

## 2022-03-11 DIAGNOSIS — C541 Malignant neoplasm of endometrium: Secondary | ICD-10-CM | POA: Insufficient documentation

## 2022-03-11 LAB — RAD ONC ARIA SESSION SUMMARY
Course Elapsed Days: 1
Plan Fractions Treated to Date: 2
Plan Prescribed Dose Per Fraction: 3 Gy
Plan Total Fractions Prescribed: 10
Plan Total Prescribed Dose: 30 Gy
Reference Point Dosage Given to Date: 6 Gy
Reference Point Session Dosage Given: 3 Gy
Session Number: 2

## 2022-03-12 ENCOUNTER — Ambulatory Visit
Admission: RE | Admit: 2022-03-12 | Discharge: 2022-03-12 | Disposition: A | Discharge status: Home / Self Care | Payer: BC Managed Care – PPO | Source: Ambulatory Visit | Attending: Radiation Oncology | Admitting: Radiation Oncology

## 2022-03-12 ENCOUNTER — Other Ambulatory Visit: Payer: Self-pay

## 2022-03-12 DIAGNOSIS — C55 Malignant neoplasm of uterus, part unspecified: Secondary | ICD-10-CM | POA: Diagnosis not present

## 2022-03-12 LAB — RAD ONC ARIA SESSION SUMMARY
Course Elapsed Days: 2
Plan Fractions Treated to Date: 3
Plan Prescribed Dose Per Fraction: 3 Gy
Plan Total Fractions Prescribed: 10
Plan Total Prescribed Dose: 30 Gy
Reference Point Dosage Given to Date: 9 Gy
Reference Point Session Dosage Given: 3 Gy
Session Number: 3

## 2022-03-12 MED FILL — Dexamethasone Sodium Phosphate Inj 100 MG/10ML: INTRAMUSCULAR | Qty: 1 | Status: AC

## 2022-03-15 ENCOUNTER — Inpatient Hospital Stay (HOSPITAL_BASED_OUTPATIENT_CLINIC_OR_DEPARTMENT_OTHER): Payer: BC Managed Care – PPO | Admitting: Hematology and Oncology

## 2022-03-15 ENCOUNTER — Inpatient Hospital Stay: Payer: BC Managed Care – PPO | Attending: Gynecologic Oncology

## 2022-03-15 ENCOUNTER — Ambulatory Visit
Admission: RE | Admit: 2022-03-15 | Discharge: 2022-03-15 | Disposition: A | Discharge status: Home / Self Care | Payer: BC Managed Care – PPO | Source: Ambulatory Visit | Attending: Radiation Oncology | Admitting: Radiation Oncology

## 2022-03-15 ENCOUNTER — Other Ambulatory Visit: Payer: Self-pay

## 2022-03-15 ENCOUNTER — Inpatient Hospital Stay: Payer: BC Managed Care – PPO

## 2022-03-15 ENCOUNTER — Encounter: Payer: Self-pay | Admitting: Hematology and Oncology

## 2022-03-15 DIAGNOSIS — C55 Malignant neoplasm of uterus, part unspecified: Secondary | ICD-10-CM | POA: Diagnosis not present

## 2022-03-15 DIAGNOSIS — N3090 Cystitis, unspecified without hematuria: Secondary | ICD-10-CM

## 2022-03-15 DIAGNOSIS — C7801 Secondary malignant neoplasm of right lung: Secondary | ICD-10-CM

## 2022-03-15 DIAGNOSIS — D61818 Other pancytopenia: Secondary | ICD-10-CM | POA: Diagnosis not present

## 2022-03-15 DIAGNOSIS — C7800 Secondary malignant neoplasm of unspecified lung: Secondary | ICD-10-CM

## 2022-03-15 LAB — CMP (CANCER CENTER ONLY)
ALT: 10 U/L (ref 0–44)
AST: 12 U/L — ABNORMAL LOW (ref 15–41)
Albumin: 3.7 g/dL (ref 3.5–5.0)
Alkaline Phosphatase: 58 U/L (ref 38–126)
Anion gap: 5 (ref 5–15)
BUN: 15 mg/dL (ref 6–20)
CO2: 28 mmol/L (ref 22–32)
Calcium: 9.2 mg/dL (ref 8.9–10.3)
Chloride: 109 mmol/L (ref 98–111)
Creatinine: 0.56 mg/dL (ref 0.44–1.00)
GFR, Estimated: >60 mL/min (ref >60)
Glucose, Bld: 126 mg/dL — ABNORMAL HIGH (ref 70–99)
Potassium: 3.9 mmol/L (ref 3.5–5.1)
Sodium: 142 mmol/L (ref 135–145)
Total Bilirubin: 0.3 mg/dL (ref 0.3–1.2)
Total Protein: 5.9 g/dL — ABNORMAL LOW (ref 6.5–8.1)

## 2022-03-15 LAB — RAD ONC ARIA SESSION SUMMARY
Course Elapsed Days: 5
Plan Fractions Treated to Date: 4
Plan Prescribed Dose Per Fraction: 3 Gy
Plan Total Fractions Prescribed: 10
Plan Total Prescribed Dose: 30 Gy
Reference Point Dosage Given to Date: 12 Gy
Reference Point Session Dosage Given: 3 Gy
Session Number: 4

## 2022-03-15 LAB — CBC WITH DIFFERENTIAL (CANCER CENTER ONLY)
Abs Immature Granulocytes: 0.04 10*3/uL (ref 0.00–0.07)
Basophils Absolute: 0 10*3/uL (ref 0.0–0.1)
Basophils Relative: 0 %
Eosinophils Absolute: 0 10*3/uL (ref 0.0–0.5)
Eosinophils Relative: 0 %
HCT: 33.9 % — ABNORMAL LOW (ref 36.0–46.0)
Hemoglobin: 10.9 g/dL — ABNORMAL LOW (ref 12.0–15.0)
Immature Granulocytes: 1 %
Lymphocytes Relative: 8 %
Lymphs Abs: 0.7 10*3/uL (ref 0.7–4.0)
MCH: 32.2 pg (ref 26.0–34.0)
MCHC: 32.2 g/dL (ref 30.0–36.0)
MCV: 100 fL (ref 80.0–100.0)
Monocytes Absolute: 0.8 10*3/uL (ref 0.1–1.0)
Monocytes Relative: 9 %
Neutro Abs: 6.9 10*3/uL (ref 1.7–7.7)
Neutrophils Relative %: 82 %
Platelet Count: 273 10*3/uL (ref 150–400)
RBC: 3.39 MIL/uL — ABNORMAL LOW (ref 3.87–5.11)
RDW: 14.3 % (ref 11.5–15.5)
WBC Count: 8.4 10*3/uL (ref 4.0–10.5)
nRBC: 0 % (ref 0.0–0.2)

## 2022-03-15 LAB — SAMPLE TO BLOOD BANK

## 2022-03-15 MED ORDER — SODIUM CHLORIDE 0.9% FLUSH
10.0000 mL | Freq: Once | INTRAVENOUS | Status: AC
  Administered 2022-03-15: 10 mL

## 2022-03-15 MED ORDER — SODIUM CHLORIDE 0.9 % IV SOLN: Freq: Once | INTRAVENOUS | Status: AC

## 2022-03-15 MED ORDER — SODIUM CHLORIDE 0.9% FLUSH
10.0000 mL | INTRAVENOUS | Status: DC | PRN
  Administered 2022-03-15: 10 mL

## 2022-03-15 MED ORDER — HEPARIN SOD (PORK) LOCK FLUSH 100 UNIT/ML IV SOLN
500.0000 [IU] | Freq: Once | INTRAVENOUS | Status: AC | PRN
  Administered 2022-03-15: 500 [IU]

## 2022-03-15 MED ORDER — SODIUM CHLORIDE 0.9 % IV SOLN
720.0000 mg/m2 | Freq: Once | INTRAVENOUS | Status: AC
  Administered 2022-03-15: 1216 mg via INTRAVENOUS
  Filled 2022-03-15: qty 31.98

## 2022-03-15 MED ORDER — SODIUM CHLORIDE 0.9 % IV SOLN
10.0000 mg | Freq: Once | INTRAVENOUS | Status: AC
  Administered 2022-03-15: 10 mg via INTRAVENOUS
  Filled 2022-03-15: qty 10

## 2022-03-15 MED ORDER — SODIUM CHLORIDE 0.9 % IV SOLN
80.0000 mg/m2 | Freq: Once | INTRAVENOUS | Status: AC
  Administered 2022-03-15: 140 mg via INTRAVENOUS
  Filled 2022-03-15: qty 14

## 2022-03-15 MED ORDER — ESTRADIOL 0.1 MG/GM VA CREA: 1.0000 | TOPICAL_CREAM | VAGINAL | 11 refills | Status: DC

## 2022-03-15 NOTE — Progress Notes (Signed)
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 last cycle of treatment due to bone pain Unfortunately, she was profoundly neutropenic without GCSF Moving forward, she will proceed with chemotherapy today with G-CSF support Due to ongoing radiation therapy, I recommend delaying the next cycle of treatment to allow bone marrow recovery Moving forward, her treatment will be every other week along with G-CSF support and she is in agreement  Pancytopenia, acquired ([place]) This is improved with recent delay of treatment Observe closely  Cystitis We have long discussion about symptoms of cystitis being related to menopausal related cystitis She will continue topical estrogen cream I will refill her prescription  No orders of the defined types were placed in this encounter.   All questions were answered. The patient knows to call the clinic with any problems, questions or concerns. The total time spent in the appointment was 20 minutes encounter with patients including review of chart and various tests results, discussions about plan of care and coordination of care plan   [name], MD 03/15/2022 12:39 PM  INTERVAL HISTORY: Please see below for problem oriented charting. she returns for treatment follow-up with her husband She felt much better since we delay the start date of treatment No recent fever or chills Her energy level has improved She continues to have mild cystitis symptoms but they are also improved  REVIEW OF SYSTEMS:   Constitutional: Denies fevers, chills or abnormal weight loss Eyes: Denies blurriness of vision Ears, nose, mouth, throat, and face: Denies mucositis or sore throat Respiratory: Denies cough, dyspnea or wheezes Cardiovascular: Denies palpitation, chest discomfort or lower extremity swelling Gastrointestinal:   Denies nausea, heartburn or change in bowel habits Skin: Denies abnormal skin rashes Lymphatics: Denies new lymphadenopathy or easy bruising Neurological:Denies numbness, tingling or new weaknesses Behavioral/Psych: Mood is stable, no new changes  All other systems were reviewed with the patient and are negative.  I have reviewed the past medical history, past surgical history, social history and family history with the patient and they are unchanged from previous note.  ALLERGIES:  is allergic to doxycycline.  MEDICATIONS:  Current Outpatient Medications  Medication Sig Dispense Refill   acetaminophen (TYLENOL) 500 MG tablet Take 1,000 mg by mouth every 6 (six) hours as needed for moderate pain or headache.     bisacodyl 5 MG EC tablet Take 1 tablet (5 mg total) by mouth daily as needed for moderate constipation. 30 tablet 3   dexamethasone (DECADRON) 4 MG tablet Take 2 tablets (8 mg total) by mouth daily. Start the day before Taxotere. Then daily after chemo for 2 days. 30 tablet 1   estradiol (ESTRACE VAGINAL) 0.1 MG/GM vaginal cream Place 1 Applicatorful vaginally 3 (three) times a week. Place finger tip size amount of cream and insert slightly past the vaginal entrance daily 42.5 g 11   lidocaine (XYLOCAINE) 2 % solution SMARTSIG:By Mouth     lidocaine-prilocaine (EMLA) cream Apply to affected area once 30 g 3   loratadine (CLARITIN) 10 MG tablet Take 10 mg by mouth daily as needed (for bone aches).     LORazepam (ATIVAN) 0.5 MG tablet Take 1 tablet (0.5 mg total) by mouth 2 (two) times daily as needed for anxiety. 30 tablet 0   magic mouthwash (nystatin, diphenhydrAMINE, alum & mag hydroxide) suspension mixture Swish and spit 5 mLs 4 times daily.  240 mL 0   magic mouthwash (nystatin, lidocaine, diphenhydrAMINE, alum & mag hydroxide) suspension Swish and spit 5 mLs 4 times daily. 240 mL 0   metoprolol succinate (TOPROL XL) 25 MG 24 hr tablet Take 1 tablet (25 mg total) by mouth at  bedtime. 30 tablet 6   ondansetron (ZOFRAN) 8 MG tablet Take 1 tablet (8 mg total) by mouth every 8 (eight) hours as needed. 30 tablet 1   oxyCODONE (OXY IR/ROXICODONE) 5 MG immediate release tablet Take 1 tablet (5 mg total) by mouth every 4 (four) hours as needed for severe pain. 30 tablet 0   prochlorperazine (COMPAZINE) 10 MG tablet Take 1 tablet (10 mg total) by mouth every 6 (six) hours as needed (Nausea or vomiting). 90 tablet 1   senna (SENOKOT) 8.6 MG TABS tablet Take 2 tablets by mouth at bedtime.     No current facility-administered medications for this visit.    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 9.5 x 7.6 x 9.0 cm complex, partially necrotic, mass involving the lower uterine segment/ cervix. No obvious direct extension into the parametrium.  2. 9 mm left pelvic sidewall lymph node is partially necrotic and worrisome for metastatic adenopathy.  3. No findings for abdominal omental or peritoneal surface disease or adenopathy.  4. Tiny low-attenuation lesion in the pancreatic head, likely benign cyst but attention on follow-up scans is suggested.  5. 2.9 cm fundal fibroid.    06/19/2021 Pathology Results   FINAL MICROSCOPIC DIAGNOSIS:   A. UTERINE, CERVICAL MASS, BIOPSY:  - Spindle cell malignancy.  - See comment.   COMMENT:  The biopsies consist of endocervical mucosa with stromal edema and one biopsy fragment has a microscopic focus with atypical spindle cells consistent with poorly differentiated malignancy.  The differential  includes a spindle cell malignancy such as sarcomatoid carcinoma and leiomyosarcoma.  Mullerian adenosarcoma is also a consideration but considered less likely   06/23/2021 Imaging   MR pelvis  10 cm uterine mass with central necrosis, which is centered in the cervix and lower uterine segment. Right parametrial involvement is seen as well as suspected invasion of the distal rectum.  Differential diagnosis includes cervical carcinoma and uterine leiomyosarcoma.   Mild bilateral iliac lymphadenopathy, highly suspicious for metastatic disease.   2.9 cm subserosal fibroid in the posterior fundus.   Normal appearance of both ovaries.     06/29/2021 PET scan   1. Hypermetabolic necrotic cervical/uterine mass with bilateral external iliac hypermetabolic lymph nodes. No evidence of distant metastatic disease. 2. 1.5 cm low-attenuation left thyroid nodule. Recommend thyroid ultrasound. (Ref: J Am Coll Radiol. 2015 Feb;12(2): 143-50).   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, pleomorphic, and myxoid areas and associated necrosis (~20%) - Tumor based in cervix and also involves lower uterine segment - Cervicovaginal margin involved by focal invasive leiomyosarcoma (3:00-5:00, A10) as well as tumor in lymphovascular spaces - Leiomyosarcoma involves right and left parametrial tissue and extends to parametrial margins - Extensive lymphovascular space invasion present, including in uterus and parametria - See synoptic report and comment   Other findings: - Leiomyomata with hyalinization, size up to 3.0 cm - Ovaries and fallopian tubes with no parenchymal involvement by leiomyosarcoma identified, although adnexal lymphovascular space invasion is present   B: Lymph nodes, right pelvic, lymphadenectomy -  One of four lymph nodes positive for metastatic leiomyosarcoma (1/4), with extracapsular extension present   C: Lymph nodes, left pelvic, lymphadenectomy - One of four lymph nodes positive for metastatic leiomyosarcoma (1/4), with extracapsular extension present  Immunohistochemical stains are performed on block A11, and demonstrate that the tumor is positive for desmin and CD10, with SMA staining the  majority of the spindle cell component but largely negative in the epithelioid / pleomorphic component. OSCAR, pancytokeratin AE1/AE3, HMB45, and PR appear negative in the tumor. ER shows patchy weak staining and myogenin stains rare cells. Block A23 also shows positive desmin and negative OSCAR pancytokeratin. Overall, the findings are most consistent with leiomyosarcoma, with extensive areas that are myxoid, epithelioid, and pleomorphic as well as more typical spindle cell areas within the overall high grade tumor (grade 3 / 3). The tumor is staged as pT2b (involves other pelvic tissues) given the parametrial involvement and pN1 for FIGO stage IIIC.    07/17/2021 Surgery   Date of Surgery: 07/17/21  Preoperative Diagnosis: High Grade Uterine Sarcoma  Postoperative Diagnosis: Same  Procedure(s): Bilateral - RADICAL ABDOMINAL HYSTER, W/BIL TOTAL PELVIC LYMPHADENECTOMY & PARA-AORTIC LYMPH NODE BX W/WO REM TUBE/OVAR VAGINAL HYSTERECTOMY, FOR UTERUS 250 G OR LESS; WITH REPAIR OF ENTEROCELE COLECTOMY, PARTIAL; WITH COLOPROCTOSTOMY (LOW PELVIC ANASTOMOSIS) WITH COLOSTOMY CYSTOURETHROSCOPY, WITH INSERTION OF INDWELLING URETERAL STENT (EG, GIBBONS OR DOUBLE-J TYPE) - Cystourethroscopy - Bilateral ureteral stent placement - Foley catheter placement  Performing Service: Gynecology Oncology Surgeon(s) and Role: Panel 1: * Lafonda Mosses, MD - Primary * Bernadene Bell, MD - Resident - Assisting * Devonne Doughty, MD - Resident - Assisting Panel 2: * Franchot Erichsen, MD - Primary  Drains:  - Left 6Fr open-ended ureteral access catheter (green) - Right 5Fr open-ended ureteral access catheter (white) - 16Fr foley catheter to drainage  * No implants in log *  Indications: 56 y.o. female with high grade uterine sarcoma. Urology was consulted pre-operatively for placement of bilateral ureteral stents. Risks, benefits, and alternatives of the above procedure were discussed and informed  consent was signed.  OperativeFindings:  - Grossly distorted architecture of urinary bladder likely 2/2 pelvic mass with anterolaterally positioned UOs - Successful placement of bilateral open-ended ureteral catheters under direct visualization - Foley catheter placed at case conclusion  Description: The patient was correctly identified in the preop holding area where written informed consent as well potential risk and complication reviewed. She agreed. The patient was brought to the operative suite where a preinduction timeout was performed. Once correct information was verified, general anesthesia was induced. The patient was then gently placed into dorsal lithotomy position with SCDs in place for VTE prophylaxis. They were prepped and draped in the usual sterile fashion and given appropriate preoperative antibiotics. A second timeout was then performed.   We inserted a 30F rigid cystoscope per urethra with copious lubrication and normal saline irrigation running. We performed cystourethroscopy, which revealed the above findings.  We turned our attention to the left ureteral orifice and canulated it with a sensor wire, using assistance of a 6Fr open-ended catheter. The wire was advanced into the renal pelvis without difficulty under visual guidance. We then advanced the stent over our wire into the renal pelvis under direct visualization and feel without complication. The wire was subsequently removed.   We then turned our attention to the right ureteral orifice and canulated it with a sensor wire, using assistance of a 5Fr open-ended catheter. The wire was advanced into the  renal pelvis without difficulty under visual guidance. We then advanced the stent over our wire into the renal pelvis under direct visualization and feel without complication. The wire was subsequently removed.   A 16Fr straight catheter was placed, with return of urine indicating appropriate position within the bladder. The  balloon was inflated with 10cc sterile water. The stents were secured to the Foley using 0-silk ties, being careful not to occlude the stents or Foley.   The patient was awoken from general anesthesia having tolerated the procedure well and taken to the PACU for routine post-operative recovery.  Post-Op Plan:  - Foley and stents per primary team    07/17/2021 Surgery   Date of Surgery: 07/17/2021  Pre-op Diagnosis: Uterine spindle cell malignancy  Post-op Diagnosis: Same  Procedure(s): Panel 1 RADICAL ABDOMINAL HYSTER, with bilateral S&O, vagineconty upper, bilateral pelvic lyphadenectomy, bilateral ureterolysis,: 66063 (CPT) Panel 2 CYSTOURETHROSCOPY, WITH INSERTION OF INDWELLING URETERAL STENT (EG, GIBBONS OR DOUBLE-J TYPE): 01601 (CPT) Note: Revisions to procedures should be made in chart - see Procedures activity.  Performing Service: Gynecology Oncology Surgeon(s) and Role: Panel 1: * Lafonda Mosses, MD - Primary * Bernadene Bell, MD - Resident - Assisting * Devonne Doughty, MD - Resident - Assisting Panel 2: * Franchot Erichsen, MD - Primary  Findings: On bimanual exam, 10cm necrotic mass filling upper vagina, unable to discretely palpate the cervix. On rectovaginal exam, rectal involvement not identified. Intraoperatively, normal upper abdominal survey including normal liver, diaphragm, stomach, omentum and bowel. Small uterus with 10cm mass expanding the cervix. Palpably enlarged bilateral pelvic lymph nodes adherent to the external iliac veins and obturator nerves, removed. No palpable para-aortic lymphadenopathy. No rectal involvement of uterine mass.   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 Interval development of left lobe pulmonary nodules, measuring up to 7 mm and highly for metastatic disease. 2. Interval development of small to upper normal lymph nodes in the pelvis, concerning for metastatic disease. 3. Postoperative seroma left pelvic sidewall. 4. Tiny cluster of tree-in-bud opacity in the peripheral right lower lobe is new and compatible with sequelae of atypical infection.   08/13/2021 Procedure   Procedure: Placement of a right IJ approach single lumen PowerPort.  Tip is positioned at the superior cavoatrial junction and catheter is ready for immediate use.  Complications: No immediate   08/14/2021 Echocardiogram    1. Left ventricular ejection fraction, by estimation, is 60 to 65%. The left ventricle has normal function. The left ventricle has no regional wall motion abnormalities. Left ventricular diastolic parameters were normal. The average left ventricular global longitudinal strain is -17.4 %. The global longitudinal strain is normal.  2. Right ventricular systolic function is normal. The right ventricular size is normal.  3. The mitral valve is  normal in structure. No evidence of mitral valve regurgitation. No evidence of mitral stenosis.  4. The aortic valve is tricuspid. Aortic valve regurgitation is not visualized. No aortic stenosis is present.  5. The inferior vena cava is normal in size with greater than 50% respiratory variability, suggesting right atrial pressure of 3 mmHg.     08/17/2021 Imaging    Multiple new and enlarging pulmonary nodules scattered throughout the lungs bilaterally, highly concerning for progressive metastatic disease to the lungs   08/18/2021 - 11/10/2021 Chemotherapy   Patient is on Treatment Plan : UTERINE LEIOMYOSARCOMA Doxorubicin q21d x 6 Cycles      11/09/2021 Imaging   IMPRESSION: 1. Multiple small bilateral pulmonary nodules, some of which are slightly increased in size. Other nodules unchanged. 2. Interval decrease in size of left pelvic sidewall lymph nodes. 3. Unchanged size of perirectal lymph nodes or soft tissue nodules. These however demonstrate new internal hypodensity, suggesting treatment response and internal necrosis. 4. Unchanged left iliac lymph node or peritoneal nodule. 5. Findings are consistent with mixed response to treatment. No evidence of new metastatic disease in the chest, abdomen, or pelvis. 6. Wall thickening and mucosal hyperenhancement of the bladder, consistent with nonspecific infectious or inflammatory cystitis. Correlate with urinalysis. 7. Status post hysterectomy and oophorectomy. Interval resolution of a previously noted left pelvic hematoma or seroma. 8. Trace, nonspecific free fluid in the low pelvis.   11/30/2021 Echocardiogram    1. Left ventricular ejection fraction, by estimation, is 40 to 45%. Left ventricular ejection fraction by 3D volume is 41 %. The left ventricle has mildly decreased function. The left ventricle has no regional wall motion abnormalities. Left ventricular  diastolic parameters are consistent with Grade I diastolic dysfunction (impaired relaxation).  2. Right ventricular systolic function is moderately reduced. The right ventricular size is normal.  3. The mitral valve is grossly normal. No evidence of mitral valve regurgitation.  4. The aortic valve is normal in structure. Aortic valve regurgitation is not visualized. No aortic stenosis is present.     12/07/2021 -  Chemotherapy   Patient is on  Treatment Plan : UTERINE UNDIFFERENTIATED / LEIOMYOSARCOMA Gemcitabine D1,8 + Docetaxel D8 (900/100) q21d      02/05/2022 Imaging   Pathologic burst fracture of L3 due to a metastatic lesion, with probable mild degree of right-sided extraosseous tumor extension in the paraspinal soft tissues, and mild involvement of the right pedicle. No epidural/spinal canal involvement.   Multilevel degenerative disc disease without any significant stenosis in the lumbar spine.   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 Multiple small bilateral pulmonary nodules, some of which are slightly increased in size. Other nodules unchanged. 2. Interval decrease in size of left pelvic sidewall lymph nodes. 3. Unchanged size of perirectal lymph nodes or soft tissue nodules. These however demonstrate new internal hypodensity, suggesting treatment response and internal necrosis. 4. Unchanged left iliac lymph node or peritoneal nodule. 5. Findings are consistent with mixed response to treatment. No evidence of new metastatic disease in the chest, abdomen, or pelvis. 6. Wall thickening and mucosal hyperenhancement of the bladder, consistent with nonspecific infectious or inflammatory cystitis. Correlate with urinalysis. 7. Status post hysterectomy and oophorectomy. Interval resolution of a previously noted left pelvic hematoma or seroma.  8. Trace, nonspecific free fluid in the low pelvis.   12/07/2021 -  Chemotherapy   Patient is on Treatment Plan : UTERINE UNDIFFERENTIATED / LEIOMYOSARCOMA Gemcitabine D1,8 + Docetaxel D8 (900/100) q21d        PHYSICAL EXAMINATION: ECOG PERFORMANCE STATUS: 1 - Symptomatic but completely ambulatory  Vitals:   03/15/22 1205  BP: 140/74  Pulse: 80  Resp: 18   Temp: 98.7 F (37.1 C)  SpO2: 99%   Filed Weights   03/15/22 1205  Weight: 131 lb 6.4 oz (59.6 kg)    GENERAL:alert, no distress and comfortable NEURO: alert & oriented x 3 with fluent speech, no focal motor/sensory deficits  LABORATORY DATA:  I have reviewed the data as listed    Component Value Date/Time   NA 142 03/15/2022 1143   K 3.9 03/15/2022 1143   CL 109 03/15/2022 1143   CO2 28 03/15/2022 1143   GLUCOSE 126 (H) 03/15/2022 1143   BUN 15 03/15/2022 1143   CREATININE 0.56 03/15/2022 1143   CALCIUM 9.2 03/15/2022 1143   PROT 5.9 (L) 03/15/2022 1143   ALBUMIN 3.7 03/15/2022 1143   AST 12 (L) 03/15/2022 1143   ALT 10 03/15/2022 1143   ALKPHOS 58 03/15/2022 1143   BILITOT 0.3 03/15/2022 1143   GFRNONAA >60 03/15/2022 1143    No results found for: SPEP, UPEP  Lab Results  Component Value Date   WBC 8.4 03/15/2022   NEUTROABS 6.9 03/15/2022   HGB 10.9 (L) 03/15/2022   HCT 33.9 (L) 03/15/2022   MCV 100.0 03/15/2022   PLT 273 03/15/2022      Chemistry      Component Value Date/Time   NA 142 03/15/2022 1143   K 3.9 03/15/2022 1143   CL 109 03/15/2022 1143   CO2 28 03/15/2022 1143   BUN 15 03/15/2022 1143   CREATININE 0.56 03/15/2022 1143      Component Value Date/Time   CALCIUM 9.2 03/15/2022 1143   ALKPHOS 58 03/15/2022 1143   AST 12 (L) 03/15/2022 1143   ALT 10 03/15/2022 1143   BILITOT 0.3 03/15/2022 1143

## 2022-03-15 NOTE — Patient Instructions (Signed)
[facility] MEDICAL ONCOLOGY  Discharge Instructions: Thank you for choosing [facility] to provide your oncology and hematology care.   If you have a lab appointment with the Cancer Center, please go directly to the Cancer Center and check in at the registration area.   Wear comfortable clothing and clothing appropriate for easy access to any Portacath or PICC line.   We strive to give you quality time with your provider. You may need to reschedule your appointment if you arrive late (15 or more minutes).  Arriving late affects you and other patients whose appointments are after yours.  Also, if you miss three or more appointments without notifying the office, you may be dismissed from the clinic at the provider's discretion.      For prescription refill requests, have your pharmacy contact our office and allow 72 hours for refills to be completed.    Today you received the following chemotherapy and/or immunotherapy agents: Gemzar and Docetaxel      To help prevent nausea and vomiting after your treatment, we encourage you to take your nausea medication as directed.  BELOW ARE SYMPTOMS THAT SHOULD BE REPORTED IMMEDIATELY: *FEVER GREATER THAN 100.4 F (38 C) OR HIGHER *CHILLS OR SWEATING *NAUSEA AND VOMITING THAT IS NOT CONTROLLED WITH YOUR NAUSEA MEDICATION *UNUSUAL SHORTNESS OF BREATH *UNUSUAL BRUISING OR BLEEDING *URINARY PROBLEMS (pain or burning when urinating, or frequent urination) *BOWEL PROBLEMS (unusual diarrhea, constipation, pain near the anus) TENDERNESS IN MOUTH AND THROAT WITH OR WITHOUT PRESENCE OF ULCERS (sore throat, sores in mouth, or a toothache) UNUSUAL RASH, SWELLING OR PAIN  UNUSUAL VAGINAL DISCHARGE OR ITCHING   Items with * indicate a potential emergency and should be followed up as soon as possible or go to the Emergency Department if any problems should occur.  Please show the CHEMOTHERAPY ALERT CARD or IMMUNOTHERAPY ALERT CARD at  check-in to the Emergency Department and triage nurse.  Should you have questions after your visit or need to cancel or reschedule your appointment, please contact [facility]COL[facility]  Dept: [phone]  and follow the prompts.  Office hours are 8:00 a.m. to 4:30 p.m. Monday - Friday. Please note that voicemails left after 4:00 p.m. may not be returned until the following business day.  We are closed weekends and major holidays. You have access to a nurse at all times for urgent questions. Please call the main number to the clinic Dept: [phone] and follow the prompts.   For any non-urgent questions, you may also contact your provider using MyChart. We now offer e-Visits for anyone 18 and older to request care online for non-urgent symptoms. For details visit [email or web address].   Also download the MyChart app! Go to the app store, search "MyChart", open the app, select [facility], and log in with your MyChart username and password.  Due to Covid, a mask is required upon entering the hospital/clinic. If you do not have a mask, one will be given to you upon arrival. For doctor visits, patients may have 1 support person aged 18 or older with them. For treatment visits, patients cannot have anyone with them due to current Covid guidelines and our immunocompromised population.  

## 2022-03-15 NOTE — Assessment & Plan Note (Signed)
This is improved with recent delay of treatment Observe closely

## 2022-03-15 NOTE — Assessment & Plan Note (Signed)
We have long discussion about symptoms of cystitis being related to menopausal related cystitis She will continue topical estrogen cream I will refill her prescription

## 2022-03-15 NOTE — Assessment & Plan Note (Signed)
G-CSF was omitted last cycle of treatment due to bone pain Unfortunately, she was profoundly neutropenic without GCSF Moving forward, she will proceed with chemotherapy today with G-CSF support Due to ongoing radiation therapy, I recommend delaying the next cycle of treatment to allow bone marrow recovery Moving forward, her treatment will be every other week along with G-CSF support and she is in agreement

## 2022-03-16 ENCOUNTER — Ambulatory Visit
Admission: RE | Admit: 2022-03-16 | Discharge: 2022-03-16 | Disposition: A | Discharge status: Home / Self Care | Payer: BC Managed Care – PPO | Source: Ambulatory Visit | Attending: Radiation Oncology | Admitting: Radiation Oncology

## 2022-03-16 ENCOUNTER — Other Ambulatory Visit: Payer: Self-pay

## 2022-03-16 ENCOUNTER — Ambulatory Visit: Payer: BC Managed Care – PPO

## 2022-03-16 DIAGNOSIS — C55 Malignant neoplasm of uterus, part unspecified: Secondary | ICD-10-CM | POA: Diagnosis not present

## 2022-03-16 LAB — RAD ONC ARIA SESSION SUMMARY
Course Elapsed Days: 6
Plan Fractions Treated to Date: 5
Plan Prescribed Dose Per Fraction: 3 Gy
Plan Total Fractions Prescribed: 10
Plan Total Prescribed Dose: 30 Gy
Reference Point Dosage Given to Date: 15 Gy
Reference Point Session Dosage Given: 3 Gy
Session Number: 5

## 2022-03-17 ENCOUNTER — Ambulatory Visit
Admission: RE | Admit: 2022-03-17 | Discharge: 2022-03-17 | Disposition: A | Discharge status: Home / Self Care | Payer: BC Managed Care – PPO | Source: Ambulatory Visit | Attending: Radiation Oncology | Admitting: Radiation Oncology

## 2022-03-17 ENCOUNTER — Other Ambulatory Visit: Payer: Self-pay

## 2022-03-17 ENCOUNTER — Inpatient Hospital Stay: Payer: BC Managed Care – PPO

## 2022-03-17 VITALS — BP 137/77 | HR 79 | Temp 98.3°F | Resp 18

## 2022-03-17 DIAGNOSIS — C55 Malignant neoplasm of uterus, part unspecified: Secondary | ICD-10-CM

## 2022-03-17 DIAGNOSIS — C7800 Secondary malignant neoplasm of unspecified lung: Secondary | ICD-10-CM

## 2022-03-17 LAB — RAD ONC ARIA SESSION SUMMARY
Course Elapsed Days: 7
Plan Fractions Treated to Date: 6
Plan Prescribed Dose Per Fraction: 3 Gy
Plan Total Fractions Prescribed: 10
Plan Total Prescribed Dose: 30 Gy
Reference Point Dosage Given to Date: 18 Gy
Reference Point Session Dosage Given: 3 Gy
Session Number: 6

## 2022-03-17 MED ORDER — PEGFILGRASTIM-CBQV 6 MG/0.6ML ~~LOC~~ SOSY
6.0000 mg | PREFILLED_SYRINGE | Freq: Once | SUBCUTANEOUS | Status: AC
  Administered 2022-03-17: 6 mg via SUBCUTANEOUS
  Filled 2022-03-17: qty 0.6

## 2022-03-18 ENCOUNTER — Ambulatory Visit
Admission: RE | Admit: 2022-03-18 | Discharge: 2022-03-18 | Disposition: A | Discharge status: Home / Self Care | Payer: BC Managed Care – PPO | Source: Ambulatory Visit | Attending: Radiation Oncology | Admitting: Radiation Oncology

## 2022-03-18 ENCOUNTER — Other Ambulatory Visit: Payer: Self-pay

## 2022-03-18 DIAGNOSIS — C55 Malignant neoplasm of uterus, part unspecified: Secondary | ICD-10-CM | POA: Diagnosis not present

## 2022-03-18 LAB — RAD ONC ARIA SESSION SUMMARY
Course Elapsed Days: 8
Plan Fractions Treated to Date: 7
Plan Prescribed Dose Per Fraction: 3 Gy
Plan Total Fractions Prescribed: 10
Plan Total Prescribed Dose: 30 Gy
Reference Point Dosage Given to Date: 21 Gy
Reference Point Session Dosage Given: 3 Gy
Session Number: 7

## 2022-03-19 ENCOUNTER — Ambulatory Visit
Admission: RE | Admit: 2022-03-19 | Discharge: 2022-03-19 | Disposition: A | Discharge status: Home / Self Care | Payer: BC Managed Care – PPO | Source: Ambulatory Visit | Attending: Radiation Oncology | Admitting: Radiation Oncology

## 2022-03-19 ENCOUNTER — Other Ambulatory Visit: Payer: Self-pay

## 2022-03-19 DIAGNOSIS — C55 Malignant neoplasm of uterus, part unspecified: Secondary | ICD-10-CM | POA: Diagnosis not present

## 2022-03-19 LAB — RAD ONC ARIA SESSION SUMMARY
Course Elapsed Days: 9
Plan Fractions Treated to Date: 8
Plan Prescribed Dose Per Fraction: 3 Gy
Plan Total Fractions Prescribed: 10
Plan Total Prescribed Dose: 30 Gy
Reference Point Dosage Given to Date: 24 Gy
Reference Point Session Dosage Given: 3 Gy
Session Number: 8

## 2022-03-22 ENCOUNTER — Other Ambulatory Visit: Payer: Self-pay

## 2022-03-22 ENCOUNTER — Ambulatory Visit
Admission: RE | Admit: 2022-03-22 | Discharge: 2022-03-22 | Disposition: A | Discharge status: Home / Self Care | Payer: BC Managed Care – PPO | Source: Ambulatory Visit | Attending: Radiation Oncology | Admitting: Radiation Oncology

## 2022-03-22 DIAGNOSIS — C55 Malignant neoplasm of uterus, part unspecified: Secondary | ICD-10-CM | POA: Diagnosis not present

## 2022-03-22 LAB — RAD ONC ARIA SESSION SUMMARY
Course Elapsed Days: 12
Plan Fractions Treated to Date: 9
Plan Prescribed Dose Per Fraction: 3 Gy
Plan Total Fractions Prescribed: 10
Plan Total Prescribed Dose: 30 Gy
Reference Point Dosage Given to Date: 27 Gy
Reference Point Session Dosage Given: 3 Gy
Session Number: 9

## 2022-03-23 ENCOUNTER — Other Ambulatory Visit: Payer: Self-pay

## 2022-03-23 ENCOUNTER — Ambulatory Visit
Admission: RE | Admit: 2022-03-23 | Discharge: 2022-03-23 | Disposition: A | Discharge status: Home / Self Care | Payer: BC Managed Care – PPO | Source: Ambulatory Visit | Attending: Radiation Oncology | Admitting: Radiation Oncology

## 2022-03-23 ENCOUNTER — Ambulatory Visit: Payer: BC Managed Care – PPO

## 2022-03-23 ENCOUNTER — Encounter: Payer: Self-pay | Admitting: Radiation Oncology

## 2022-03-23 DIAGNOSIS — C55 Malignant neoplasm of uterus, part unspecified: Secondary | ICD-10-CM | POA: Diagnosis not present

## 2022-03-23 LAB — RAD ONC ARIA SESSION SUMMARY
Course Elapsed Days: 13
Plan Fractions Treated to Date: 10
Plan Prescribed Dose Per Fraction: 3 Gy
Plan Total Fractions Prescribed: 10
Plan Total Prescribed Dose: 30 Gy
Reference Point Dosage Given to Date: 30 Gy
Reference Point Session Dosage Given: 3 Gy
Session Number: 10

## 2022-04-05 ENCOUNTER — Inpatient Hospital Stay: Payer: BC Managed Care – PPO

## 2022-04-05 ENCOUNTER — Encounter: Payer: Self-pay | Admitting: Hematology and Oncology

## 2022-04-05 ENCOUNTER — Other Ambulatory Visit: Payer: Self-pay

## 2022-04-05 ENCOUNTER — Inpatient Hospital Stay (HOSPITAL_BASED_OUTPATIENT_CLINIC_OR_DEPARTMENT_OTHER): Payer: BC Managed Care – PPO | Admitting: Hematology and Oncology

## 2022-04-05 DIAGNOSIS — D61818 Other pancytopenia: Secondary | ICD-10-CM

## 2022-04-05 DIAGNOSIS — C55 Malignant neoplasm of uterus, part unspecified: Secondary | ICD-10-CM

## 2022-04-05 DIAGNOSIS — K1231 Oral mucositis (ulcerative) due to antineoplastic therapy: Secondary | ICD-10-CM | POA: Diagnosis not present

## 2022-04-05 DIAGNOSIS — N3090 Cystitis, unspecified without hematuria: Secondary | ICD-10-CM | POA: Diagnosis not present

## 2022-04-05 DIAGNOSIS — C7801 Secondary malignant neoplasm of right lung: Secondary | ICD-10-CM

## 2022-04-05 DIAGNOSIS — C7800 Secondary malignant neoplasm of unspecified lung: Secondary | ICD-10-CM

## 2022-04-05 LAB — CMP (CANCER CENTER ONLY)
ALT: 12 U/L (ref 0–44)
AST: 15 U/L (ref 15–41)
Albumin: 3.1 g/dL — ABNORMAL LOW (ref 3.5–5.0)
Alkaline Phosphatase: 58 U/L (ref 38–126)
Anion gap: 4 — ABNORMAL LOW (ref 5–15)
BUN: 12 mg/dL (ref 6–20)
CO2: 28 mmol/L (ref 22–32)
Calcium: 8.3 mg/dL — ABNORMAL LOW (ref 8.9–10.3)
Chloride: 108 mmol/L (ref 98–111)
Creatinine: 0.62 mg/dL (ref 0.44–1.00)
GFR, Estimated: >60 mL/min (ref >60)
Glucose, Bld: 121 mg/dL — ABNORMAL HIGH (ref 70–99)
Potassium: 3.7 mmol/L (ref 3.5–5.1)
Sodium: 140 mmol/L (ref 135–145)
Total Bilirubin: 0.3 mg/dL (ref 0.3–1.2)
Total Protein: 5.2 g/dL — ABNORMAL LOW (ref 6.5–8.1)

## 2022-04-05 LAB — CBC WITH DIFFERENTIAL (CANCER CENTER ONLY)
Abs Immature Granulocytes: 0.01 10*3/uL (ref 0.00–0.07)
Basophils Absolute: 0 10*3/uL (ref 0.0–0.1)
Basophils Relative: 2 %
Eosinophils Absolute: 0 10*3/uL (ref 0.0–0.5)
Eosinophils Relative: 2 %
HCT: 34.1 % — ABNORMAL LOW (ref 36.0–46.0)
Hemoglobin: 10.9 g/dL — ABNORMAL LOW (ref 12.0–15.0)
Immature Granulocytes: 0 %
Lymphocytes Relative: 20 %
Lymphs Abs: 0.5 10*3/uL — ABNORMAL LOW (ref 0.7–4.0)
MCH: 31.3 pg (ref 26.0–34.0)
MCHC: 32 g/dL (ref 30.0–36.0)
MCV: 98 fL (ref 80.0–100.0)
Monocytes Absolute: 0.4 10*3/uL (ref 0.1–1.0)
Monocytes Relative: 17 %
Neutro Abs: 1.5 10*3/uL — ABNORMAL LOW (ref 1.7–7.7)
Neutrophils Relative %: 59 %
Platelet Count: 239 10*3/uL (ref 150–400)
RBC: 3.48 MIL/uL — ABNORMAL LOW (ref 3.87–5.11)
RDW: 14.6 % (ref 11.5–15.5)
WBC Count: 2.4 10*3/uL — ABNORMAL LOW (ref 4.0–10.5)
nRBC: 0 % (ref 0.0–0.2)

## 2022-04-05 LAB — SAMPLE TO BLOOD BANK

## 2022-04-05 MED ORDER — HEPARIN SOD (PORK) LOCK FLUSH 100 UNIT/ML IV SOLN
500.0000 [IU] | Freq: Once | INTRAVENOUS | Status: AC | PRN
  Administered 2022-04-05: 500 [IU]

## 2022-04-05 MED ORDER — SODIUM CHLORIDE 0.9% FLUSH
10.0000 mL | Freq: Once | INTRAVENOUS | Status: AC
  Administered 2022-04-05: 10 mL

## 2022-04-05 MED ORDER — SODIUM CHLORIDE 0.9 % IV SOLN
720.0000 mg/m2 | Freq: Once | INTRAVENOUS | Status: AC
  Administered 2022-04-05: 1216 mg via INTRAVENOUS
  Filled 2022-04-05: qty 31.98

## 2022-04-05 MED ORDER — PROCHLORPERAZINE MALEATE 10 MG PO TABS
10.0000 mg | ORAL_TABLET | Freq: Once | ORAL | Status: AC
  Administered 2022-04-05: 10 mg via ORAL
  Filled 2022-04-05: qty 1

## 2022-04-05 MED ORDER — SODIUM CHLORIDE 0.9 % IV SOLN: Freq: Once | INTRAVENOUS | Status: AC

## 2022-04-05 MED ORDER — SODIUM CHLORIDE 0.9% FLUSH
10.0000 mL | INTRAVENOUS | Status: DC | PRN
  Administered 2022-04-05: 10 mL

## 2022-04-05 NOTE — Assessment & Plan Note (Signed)
This is improved She will continue topical estrogen cream as prescribed

## 2022-04-05 NOTE — Assessment & Plan Note (Signed)
This is not significant She has Magic mouthwash to use as needed

## 2022-04-09 ENCOUNTER — Other Ambulatory Visit: Payer: Self-pay | Admitting: Hematology and Oncology

## 2022-04-16 ENCOUNTER — Encounter: Payer: Self-pay | Admitting: Radiation Oncology

## 2022-04-16 MED FILL — Dexamethasone Sodium Phosphate Inj 100 MG/10ML: INTRAMUSCULAR | Qty: 1 | Status: AC

## 2022-04-19 ENCOUNTER — Encounter: Payer: Self-pay | Admitting: Hematology and Oncology

## 2022-04-19 ENCOUNTER — Other Ambulatory Visit: Payer: Self-pay

## 2022-04-19 ENCOUNTER — Inpatient Hospital Stay: Payer: BC Managed Care – PPO

## 2022-04-19 ENCOUNTER — Inpatient Hospital Stay (HOSPITAL_BASED_OUTPATIENT_CLINIC_OR_DEPARTMENT_OTHER): Payer: BC Managed Care – PPO | Admitting: Hematology and Oncology

## 2022-04-19 ENCOUNTER — Inpatient Hospital Stay: Payer: BC Managed Care – PPO | Attending: Gynecologic Oncology

## 2022-04-19 DIAGNOSIS — N3090 Cystitis, unspecified without hematuria: Secondary | ICD-10-CM

## 2022-04-19 DIAGNOSIS — C7801 Secondary malignant neoplasm of right lung: Secondary | ICD-10-CM

## 2022-04-19 DIAGNOSIS — D649 Anemia, unspecified: Secondary | ICD-10-CM | POA: Diagnosis not present

## 2022-04-19 DIAGNOSIS — Z5189 Encounter for other specified aftercare: Secondary | ICD-10-CM | POA: Diagnosis not present

## 2022-04-19 DIAGNOSIS — Z90710 Acquired absence of both cervix and uterus: Secondary | ICD-10-CM | POA: Insufficient documentation

## 2022-04-19 DIAGNOSIS — Z5111 Encounter for antineoplastic chemotherapy: Secondary | ICD-10-CM | POA: Diagnosis present

## 2022-04-19 DIAGNOSIS — K1231 Oral mucositis (ulcerative) due to antineoplastic therapy: Secondary | ICD-10-CM | POA: Insufficient documentation

## 2022-04-19 DIAGNOSIS — C7800 Secondary malignant neoplasm of unspecified lung: Secondary | ICD-10-CM | POA: Diagnosis not present

## 2022-04-19 DIAGNOSIS — T451X5A Adverse effect of antineoplastic and immunosuppressive drugs, initial encounter: Secondary | ICD-10-CM | POA: Diagnosis not present

## 2022-04-19 DIAGNOSIS — C55 Malignant neoplasm of uterus, part unspecified: Secondary | ICD-10-CM

## 2022-04-19 DIAGNOSIS — Z7952 Long term (current) use of systemic steroids: Secondary | ICD-10-CM | POA: Insufficient documentation

## 2022-04-19 DIAGNOSIS — C7951 Secondary malignant neoplasm of bone: Secondary | ICD-10-CM | POA: Diagnosis not present

## 2022-04-19 DIAGNOSIS — D61818 Other pancytopenia: Secondary | ICD-10-CM

## 2022-04-19 DIAGNOSIS — G620 Drug-induced polyneuropathy: Secondary | ICD-10-CM

## 2022-04-19 DIAGNOSIS — Z79899 Other long term (current) drug therapy: Secondary | ICD-10-CM | POA: Insufficient documentation

## 2022-04-19 LAB — CMP (CANCER CENTER ONLY)
ALT: 17 U/L (ref 0–44)
AST: 12 U/L — ABNORMAL LOW (ref 15–41)
Albumin: 3.4 g/dL — ABNORMAL LOW (ref 3.5–5.0)
Alkaline Phosphatase: 61 U/L (ref 38–126)
Anion gap: 5 (ref 5–15)
BUN: 12 mg/dL (ref 6–20)
CO2: 28 mmol/L (ref 22–32)
Calcium: 8.9 mg/dL (ref 8.9–10.3)
Chloride: 108 mmol/L (ref 98–111)
Creatinine: 0.56 mg/dL (ref 0.44–1.00)
GFR, Estimated: >60 mL/min (ref >60)
Glucose, Bld: 109 mg/dL — ABNORMAL HIGH (ref 70–99)
Potassium: 3.8 mmol/L (ref 3.5–5.1)
Sodium: 141 mmol/L (ref 135–145)
Total Bilirubin: 0.3 mg/dL (ref 0.3–1.2)
Total Protein: 5.8 g/dL — ABNORMAL LOW (ref 6.5–8.1)

## 2022-04-19 LAB — CBC WITH DIFFERENTIAL (CANCER CENTER ONLY)
Abs Immature Granulocytes: 0.03 10*3/uL (ref 0.00–0.07)
Basophils Absolute: 0 10*3/uL (ref 0.0–0.1)
Basophils Relative: 0 %
Eosinophils Absolute: 0 10*3/uL (ref 0.0–0.5)
Eosinophils Relative: 0 %
HCT: 33.4 % — ABNORMAL LOW (ref 36.0–46.0)
Hemoglobin: 11.1 g/dL — ABNORMAL LOW (ref 12.0–15.0)
Immature Granulocytes: 0 %
Lymphocytes Relative: 8 %
Lymphs Abs: 0.7 10*3/uL (ref 0.7–4.0)
MCH: 32.1 pg (ref 26.0–34.0)
MCHC: 33.2 g/dL (ref 30.0–36.0)
MCV: 96.5 fL (ref 80.0–100.0)
Monocytes Absolute: 0.8 10*3/uL (ref 0.1–1.0)
Monocytes Relative: 9 %
Neutro Abs: 8 10*3/uL — ABNORMAL HIGH (ref 1.7–7.7)
Neutrophils Relative %: 83 %
Platelet Count: 223 10*3/uL (ref 150–400)
RBC: 3.46 MIL/uL — ABNORMAL LOW (ref 3.87–5.11)
RDW: 15.3 % (ref 11.5–15.5)
WBC Count: 9.6 10*3/uL (ref 4.0–10.5)
nRBC: 0 % (ref 0.0–0.2)

## 2022-04-19 LAB — SAMPLE TO BLOOD BANK

## 2022-04-19 MED ORDER — SODIUM CHLORIDE 0.9 % IV SOLN
10.0000 mg | Freq: Once | INTRAVENOUS | Status: AC
  Administered 2022-04-19: 10 mg via INTRAVENOUS
  Filled 2022-04-19: qty 10

## 2022-04-19 MED ORDER — SODIUM CHLORIDE 0.9 % IV SOLN: Freq: Once | INTRAVENOUS | Status: AC

## 2022-04-19 MED ORDER — HEPARIN SOD (PORK) LOCK FLUSH 100 UNIT/ML IV SOLN
500.0000 [IU] | Freq: Once | INTRAVENOUS | Status: AC | PRN
  Administered 2022-04-19: 500 [IU]

## 2022-04-19 MED ORDER — SODIUM CHLORIDE 0.9% FLUSH
10.0000 mL | INTRAVENOUS | Status: DC | PRN
  Administered 2022-04-19: 10 mL

## 2022-04-19 MED ORDER — SODIUM CHLORIDE 0.9 % IV SOLN
720.0000 mg/m2 | Freq: Once | INTRAVENOUS | Status: AC
  Administered 2022-04-19: 1216 mg via INTRAVENOUS
  Filled 2022-04-19: qty 31.98

## 2022-04-19 MED ORDER — SODIUM CHLORIDE 0.9% FLUSH
10.0000 mL | Freq: Once | INTRAVENOUS | Status: AC
  Administered 2022-04-19: 10 mL

## 2022-04-19 MED ORDER — SODIUM CHLORIDE 0.9 % IV SOLN
80.0000 mg/m2 | Freq: Once | INTRAVENOUS | Status: AC
  Administered 2022-04-19: 140 mg via INTRAVENOUS
  Filled 2022-04-19: qty 14

## 2022-04-19 NOTE — Assessment & Plan Note (Signed)
She has mild anemia but not symptomatic We will proceed with treatment without delay I recommend repeat imaging study around end of August for objective assessment of response to treatment

## 2022-04-19 NOTE — Assessment & Plan Note (Signed)
This is improved She will continue topical estrogen cream as prescribed

## 2022-04-19 NOTE — Assessment & Plan Note (Signed)
This is not significant She has Magic mouthwash to use as needed

## 2022-04-19 NOTE — Assessment & Plan Note (Signed)
she has mild peripheral neuropathy, likely related to side effects of treatment. It is only mild, not bothering the patient. I will observe for now If it gets worse in the future, I will consider modifying the dose of the treatment  

## 2022-04-19 NOTE — Progress Notes (Signed)
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 It is only mild, not bothering the patient. I will observe for now If it gets worse in the future, I will consider modifying the dose of the treatment   No orders of the defined types were placed in this encounter.   All questions were answered. The patient knows to call the clinic with any problems, questions or concerns. The total time spent in the appointment was 20 minutes encounter with patients including review of chart and various tests results, discussions about plan of care and coordination of care plan   [name], MD 04/19/2022 12:51 PM  INTERVAL HISTORY: Please see below for problem oriented charting. she returns for treatment follow-up seen prior to day 8 of treatment She has very mild peripheral neuropathy and very mild mucositis but they do not bother her Her cystitis symptoms are improved She denies recent bone pain  REVIEW OF SYSTEMS:   Constitutional: Denies fevers, chills or abnormal weight loss Eyes: Denies blurriness of vision Respiratory: Denies cough, dyspnea or wheezes Cardiovascular: Denies palpitation, chest discomfort or lower extremity swelling Gastrointestinal:  Denies nausea, heartburn or change in bowel habits Skin: Denies abnormal skin rashes Lymphatics: Denies new  lymphadenopathy or easy bruising Behavioral/Psych: Mood is stable, no new changes  All other systems were reviewed with the patient and are negative.  I have reviewed the past medical history, past surgical history, social history and family history with the patient and they are unchanged from previous note.  ALLERGIES:  is allergic to doxycycline.  MEDICATIONS:  Current Outpatient Medications  Medication Sig Dispense Refill   acetaminophen (TYLENOL) 500 MG tablet Take 1,000 mg by mouth every 6 (six) hours as needed for moderate pain or headache.     bisacodyl 5 MG EC tablet Take 1 tablet (5 mg total) by mouth daily as needed for moderate constipation. 30 tablet 3   dexamethasone (DECADRON) 4 MG tablet Take 2 tablets (8 mg total) by mouth daily. Start the day before Taxotere. Then daily after chemo for 2 days. 30 tablet 1   estradiol (ESTRACE) 0.1 MG/GM vaginal cream PLACE FINGER TIP SIZE AMOUNT OF CREAM AND INSERT SLIGHTLY PAST THE VAGINAL ENTRANCE 3 TIMES DAILY 126 g 4   lidocaine (XYLOCAINE) 2 % solution SMARTSIG:By Mouth     lidocaine-prilocaine (EMLA) cream Apply to affected area once 30 g 3   loratadine (CLARITIN) 10 MG tablet Take 10 mg by mouth daily as needed (for bone aches).     LORazepam (ATIVAN) 0.5 MG tablet Take 1 tablet (0.5 mg total) by mouth 2 (two) times daily as needed for anxiety. 30 tablet 0   magic mouthwash (nystatin, diphenhydrAMINE, alum & mag hydroxide) suspension mixture Swish and spit 5 mLs 4 times daily. 240 mL 0   metoprolol succinate (TOPROL XL) 25 MG 24 hr tablet Take 1 tablet (25 mg total) by mouth at bedtime. [place]  tablet 6   ondansetron (ZOFRAN) 8 MG tablet Take 1 tablet (8 mg total) by mouth every 8 (eight) hours as needed. 30 tablet 1   oxyCODONE (OXY IR/ROXICODONE) 5 MG immediate release tablet Take 1 tablet (5 mg total) by mouth every 4 (four) hours as needed for severe pain. 30 tablet 0   prochlorperazine (COMPAZINE) 10 MG tablet Take 1 tablet (10 mg  total) by mouth every 6 (six) hours as needed (Nausea or vomiting). 90 tablet 1   senna (SENOKOT) 8.6 MG TABS tablet Take 2 tablets by mouth at bedtime.     No current facility-administered medications for this visit.    SUMMARY OF ONCOLOGIC HISTORY: Oncology History  Uterine leiomyosarcoma ([place])  06/11/2021 Imaging   1. 9.5 x 7.6 x 9.0 cm complex, partially necrotic, mass involving the lower uterine segment/ cervix. No obvious direct extension into the parametrium.  2. 9 mm left pelvic sidewall lymph node is partially necrotic and worrisome for metastatic adenopathy.  3. No findings for abdominal omental or peritoneal surface disease or adenopathy.  4. Tiny low-attenuation lesion in the pancreatic head, likely benign cyst but attention on follow-up scans is suggested.  5. 2.9 cm fundal fibroid.    06/19/2021 Pathology Results   FINAL MICROSCOPIC DIAGNOSIS:   A. UTERINE, CERVICAL MASS, BIOPSY:  - Spindle cell malignancy.  - See comment.   COMMENT:  The biopsies consist of endocervical mucosa with stromal edema and one biopsy fragment has a microscopic focus with atypical spindle cells consistent with poorly differentiated malignancy.  The differential  includes a spindle cell malignancy such as sarcomatoid carcinoma and leiomyosarcoma.  Mullerian adenosarcoma is also a consideration but considered less likely   06/23/2021 Imaging   MR pelvis  10 cm uterine mass with central necrosis, which is centered in the cervix and lower uterine segment. Right parametrial involvement is seen as well as suspected invasion of the distal rectum. Differential diagnosis includes cervical carcinoma and uterine leiomyosarcoma.   Mild bilateral iliac lymphadenopathy, highly suspicious for metastatic disease.   2.9 cm subserosal fibroid in the posterior fundus.   Normal appearance of both ovaries.     06/29/2021 PET scan   1. Hypermetabolic necrotic cervical/uterine mass with bilateral external iliac  hypermetabolic lymph nodes. No evidence of distant metastatic disease. 2. 1.5 cm low-attenuation left thyroid nodule. Recommend thyroid ultrasound. (Ref: J Am Coll Radiol. 2015 Feb;12(2): 143-50).   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, pleomorphic, and myxoid areas and associated necrosis (~20%) - Tumor based in cervix and also involves lower uterine segment - Cervicovaginal margin involved by focal invasive leiomyosarcoma (3:00-5:00, A10) as well as tumor in lymphovascular spaces - Leiomyosarcoma involves right and left parametrial tissue and extends to parametrial margins - Extensive lymphovascular space invasion present, including in uterus and parametria - See synoptic report and comment   Other findings: - Leiomyomata with hyalinization, size up to 3.0 cm - Ovaries and fallopian tubes with no parenchymal involvement by leiomyosarcoma identified, although adnexal lymphovascular space invasion is present   B: Lymph nodes, right pelvic, lymphadenectomy - One of four lymph nodes positive for metastatic leiomyosarcoma (1/4), with extracapsular extension present   C: Lymph nodes, left pelvic, lymphadenectomy - One of four lymph nodes positive for metastatic leiomyosarcoma (1/4), with extracapsular extension present  Immunohistochemical stains are performed on block A11, and demonstrate that  the tumor is positive for desmin and CD10, with SMA staining the majority of the spindle cell component but largely negative in the epithelioid / pleomorphic component. OSCAR, pancytokeratin AE1/AE3, HMB45, and PR appear negative in the tumor. ER shows patchy weak staining and myogenin stains rare cells. Block A23 also shows positive desmin and negative OSCAR pancytokeratin. Overall, the findings are most consistent with  leiomyosarcoma, with extensive areas that are myxoid, epithelioid, and pleomorphic as well as more typical spindle cell areas within the overall high grade tumor (grade 3 / 3). The tumor is staged as pT2b (involves other pelvic tissues) given the parametrial involvement and pN1 for FIGO stage IIIC.    07/17/2021 Surgery   Date of Surgery: 07/17/21  Preoperative Diagnosis: High Grade Uterine Sarcoma  Postoperative Diagnosis: Same  Procedure(s): Bilateral - RADICAL ABDOMINAL HYSTER, W/BIL TOTAL PELVIC LYMPHADENECTOMY & PARA-AORTIC LYMPH NODE BX W/WO REM TUBE/OVAR VAGINAL HYSTERECTOMY, FOR UTERUS 250 G OR LESS; WITH REPAIR OF ENTEROCELE COLECTOMY, PARTIAL; WITH COLOPROCTOSTOMY (LOW PELVIC ANASTOMOSIS) WITH COLOSTOMY CYSTOURETHROSCOPY, WITH INSERTION OF INDWELLING URETERAL STENT (EG, GIBBONS OR DOUBLE-J TYPE) - Cystourethroscopy - Bilateral ureteral stent placement - Foley catheter placement  Performing Service: Gynecology Oncology Surgeon(s) and Role: Panel 1: * Lafonda Mosses, MD - Primary * Bernadene Bell, MD - Resident - Assisting * Devonne Doughty, MD - Resident - Assisting Panel 2: * Franchot Erichsen, MD - Primary  Drains:  - Left 6Fr open-ended ureteral access catheter (green) - Right 5Fr open-ended ureteral access catheter (white) - 16Fr foley catheter to drainage  * No implants in log *  Indications: 56 y.o. female with high grade uterine sarcoma. Urology was consulted pre-operatively for placement of bilateral ureteral stents. Risks, benefits, and alternatives of the above procedure were discussed and informed consent was signed.  OperativeFindings:  - Grossly distorted architecture of urinary bladder likely 2/2 pelvic mass with anterolaterally positioned UOs - Successful placement of bilateral open-ended ureteral catheters under direct visualization - Foley catheter placed at case conclusion  Description: The patient was correctly identified in the preop  holding area where written informed consent as well potential risk and complication reviewed. She agreed. The patient was brought to the operative suite where a preinduction timeout was performed. Once correct information was verified, general anesthesia was induced. The patient was then gently placed into dorsal lithotomy position with SCDs in place for VTE prophylaxis. They were prepped and draped in the usual sterile fashion and given appropriate preoperative antibiotics. A second timeout was then performed.   We inserted a 62F rigid cystoscope per urethra with copious lubrication and normal saline irrigation running. We performed cystourethroscopy, which revealed the above findings.  We turned our attention to the left ureteral orifice and canulated it with a sensor wire, using assistance of a 6Fr open-ended catheter. The wire was advanced into the renal pelvis without difficulty under visual guidance. We then advanced the stent over our wire into the renal pelvis under direct visualization and feel without complication. The wire was subsequently removed.   We then turned our attention to the right ureteral orifice and canulated it with a sensor wire, using assistance of a 5Fr open-ended catheter. The wire was advanced into the renal pelvis without difficulty under visual guidance. We then advanced the stent over our wire into the renal pelvis under direct visualization and feel without complication. The wire was subsequently removed.   A 16Fr straight catheter was placed, with return of urine indicating appropriate position within the  bladder. The balloon was inflated with 10cc sterile water. The stents were secured to the Foley using 0-silk ties, being careful not to occlude the stents or Foley.   The patient was awoken from general anesthesia having tolerated the procedure well and taken to the PACU for routine post-operative recovery.  Post-Op Plan:  - Foley and stents per primary team     07/17/2021 Surgery   Date of Surgery: 07/17/2021  Pre-op Diagnosis: Uterine spindle cell malignancy  Post-op Diagnosis: Same  Procedure(s): Panel 1 RADICAL ABDOMINAL HYSTER, with bilateral S&O, vagineconty upper, bilateral pelvic lyphadenectomy, bilateral ureterolysis,: 57017 (CPT) Panel 2 CYSTOURETHROSCOPY, WITH INSERTION OF INDWELLING URETERAL STENT (EG, GIBBONS OR DOUBLE-J TYPE): 79390 (CPT) Note: Revisions to procedures should be made in chart - see Procedures activity.  Performing Service: Gynecology Oncology Surgeon(s) and Role: Panel 1: * Lafonda Mosses, MD - Primary * Bernadene Bell, MD - Resident - Assisting * Devonne Doughty, MD - Resident - Assisting Panel 2: * Franchot Erichsen, MD - Primary  Findings: On bimanual exam, 10cm necrotic mass filling upper vagina, unable to discretely palpate the cervix. On rectovaginal exam, rectal involvement not identified. Intraoperatively, normal upper abdominal survey including normal liver, diaphragm, stomach, omentum and bowel. Small uterus with 10cm mass expanding the cervix. Palpably enlarged bilateral pelvic lymph nodes adherent to the external iliac veins and obturator nerves, removed. No palpable para-aortic lymphadenopathy. No rectal involvement of uterine mass.   Specimens:  ID Type Source Tests Collected by Time Destination  1 : uterus,cervix,bilateral tubes/ovaries Tissue Uterus SURGICAL PATHOLOGY EXAM Lafonda Mosses, MD 07/17/2021 0932  2 : right pelvic lymph node Tissue Lymph Node SURGICAL PATHOLOGY EXAM Lafonda Mosses, MD 07/17/2021 1125  3 : LEFT PELVIC LN Tissue Lymph Node SURGICAL PATHOLOGY EXAM Lafonda Mosses, MD 07/17/2021 1144    08/06/2021 Initial Diagnosis   Uterine leiomyosarcoma ([place])   08/06/2021 Cancer Staging   Staging form: Corpus Uteri - Leiomyosarcoma and Endometrial Stromal Sarcoma, AJCC 8th Edition - Pathologic stage from 08/06/2021: FIGO Stage IVB (pT3, pN1, cM1) - Signed by  [name], MD on 08/11/2021 Stage prefix: Initial diagnosis   08/10/2021 Imaging   CT abdomen and pelvis 1. Interval development of left lobe pulmonary nodules, measuring up to 7 mm and highly for metastatic disease. 2. Interval development of small to upper normal lymph nodes in the pelvis, concerning for metastatic disease. 3. Postoperative seroma left pelvic sidewall. 4. Tiny cluster of tree-in-bud opacity in the peripheral right lower lobe is new and compatible with sequelae of atypical infection.   08/13/2021 Procedure   Procedure: Placement of a right IJ approach single lumen PowerPort.  Tip is positioned at the superior cavoatrial junction and catheter is ready for immediate use.  Complications: No immediate   08/14/2021 Echocardiogram    1. Left ventricular ejection fraction, by estimation, is 60 to 65%. The left ventricle has normal function. The left ventricle has no regional wall motion abnormalities. Left ventricular diastolic parameters were normal. The average left ventricular global longitudinal strain is -17.4 %. The global longitudinal strain is normal.  2. Right ventricular systolic function is normal. The right ventricular size is normal.  3. The mitral valve is normal in structure. No evidence of mitral valve regurgitation. No evidence of mitral stenosis.  4. The aortic valve is tricuspid. Aortic valve regurgitation is not visualized. No aortic stenosis is present.  5. The inferior vena cava is normal in size with greater than 50% respiratory variability, suggesting  right atrial pressure of 3 mmHg.     08/17/2021 Imaging   Multiple new and enlarging pulmonary nodules scattered throughout the lungs bilaterally, highly concerning for progressive metastatic disease to the lungs   08/18/2021 - 11/10/2021 Chemotherapy   Patient is on Treatment Plan : UTERINE LEIOMYOSARCOMA Doxorubicin q21d x 6 Cycles     11/09/2021 Imaging   IMPRESSION: 1. Multiple small bilateral pulmonary  nodules, some of which are slightly increased in size. Other nodules unchanged. 2. Interval decrease in size of left pelvic sidewall lymph nodes. 3. Unchanged size of perirectal lymph nodes or soft tissue nodules. These however demonstrate new internal hypodensity, suggesting treatment response and internal necrosis. 4. Unchanged left iliac lymph node or peritoneal nodule. 5. Findings are consistent with mixed response to treatment. No evidence of new metastatic disease in the chest, abdomen, or pelvis. 6. Wall thickening and mucosal hyperenhancement of the bladder, consistent with nonspecific infectious or inflammatory cystitis. Correlate with urinalysis. 7. Status post hysterectomy and oophorectomy. Interval resolution of a previously noted left pelvic hematoma or seroma. 8. Trace, nonspecific free fluid in the low pelvis.   11/30/2021 Echocardiogram    1. Left ventricular ejection fraction, by estimation, is 40 to 45%. Left ventricular ejection fraction by 3D volume is 41 %. The left ventricle has mildly decreased function. The left ventricle has no regional wall motion abnormalities. Left ventricular  diastolic parameters are consistent with Grade I diastolic dysfunction (impaired relaxation).  2. Right ventricular systolic function is moderately reduced. The right ventricular size is normal.  3. The mitral valve is grossly normal. No evidence of mitral valve regurgitation.  4. The aortic valve is normal in structure. Aortic valve regurgitation is not visualized. No aortic stenosis is present.     12/07/2021 -  Chemotherapy   Patient is on Treatment Plan : UTERINE UNDIFFERENTIATED / LEIOMYOSARCOMA Gemcitabine D1,8 + Docetaxel D8 (900/100) q21d     02/05/2022 Imaging   Pathologic burst fracture of L3 due to a metastatic lesion, with probable mild degree of right-sided extraosseous tumor extension in the paraspinal soft tissues, and mild involvement of the right pedicle. No epidural/spinal canal  involvement.   Multilevel degenerative disc disease without any significant stenosis in the lumbar spine.   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 Multiple small bilateral pulmonary nodules, some of which are slightly increased in size. Other nodules unchanged. 2. Interval decrease in size of left pelvic sidewall lymph nodes. 3. Unchanged size of perirectal lymph nodes or soft tissue nodules. These however demonstrate new internal hypodensity, suggesting treatment response and internal necrosis. 4. Unchanged left iliac lymph node or peritoneal nodule. 5. Findings are consistent with mixed response to treatment. No evidence of new metastatic disease in the chest, abdomen, or pelvis. 6. Wall thickening and mucosal hyperenhancement of the bladder, consistent with nonspecific infectious or inflammatory cystitis. Correlate with urinalysis. 7. Status post hysterectomy and oophorectomy. Interval resolution of a previously noted left pelvic hematoma or seroma. 8. Trace, nonspecific free fluid in the low pelvis.   12/07/2021 -  Chemotherapy   Patient is on Treatment Plan : UTERINE UNDIFFERENTIATED / LEIOMYOSARCOMA Gemcitabine D1,8 + Docetaxel D8 (900/100) q21d       PHYSICAL EXAMINATION: ECOG PERFORMANCE STATUS: 1 - Symptomatic but completely ambulatory  Vitals:  04/19/22 1223  BP: 135/63  Pulse: 80  Resp: 18  Temp: 97.9 F (36.6 C)  SpO2: 100%   Filed Weights   04/19/22 1223  Weight: 128 lb 6.4 oz (58.2 kg)    GENERAL:alert, no distress and comfortable NEURO: alert & oriented x 3 with fluent speech, no focal motor/sensory deficits  LABORATORY DATA:  I have reviewed the data as listed    Component Value Date/Time   NA 140 04/05/2022 1150   K 3.7  04/05/2022 1150   CL 108 04/05/2022 1150   CO2 28 04/05/2022 1150   GLUCOSE 121 (H) 04/05/2022 1150   BUN 12 04/05/2022 1150   CREATININE 0.62 04/05/2022 1150   CALCIUM 8.3 (L) 04/05/2022 1150   PROT 5.2 (L) 04/05/2022 1150   ALBUMIN 3.1 (L) 04/05/2022 1150   AST 15 04/05/2022 1150   ALT 12 04/05/2022 1150   ALKPHOS 58 04/05/2022 1150   BILITOT 0.3 04/05/2022 1150   GFRNONAA >60 04/05/2022 1150    No results found for: "SPEP", "UPEP"  Lab Results  Component Value Date   WBC 9.6 04/19/2022   NEUTROABS 8.0 (H) 04/19/2022   HGB 11.1 (L) 04/19/2022   HCT 33.4 (L) 04/19/2022   MCV 96.5 04/19/2022   PLT 223 04/19/2022      Chemistry      Component Value Date/Time   NA 140 04/05/2022 1150   K 3.7 04/05/2022 1150   CL 108 04/05/2022 1150   CO2 28 04/05/2022 1150   BUN 12 04/05/2022 1150   CREATININE 0.62 04/05/2022 1150      Component Value Date/Time   CALCIUM 8.3 (L) 04/05/2022 1150   ALKPHOS 58 04/05/2022 1150   AST 15 04/05/2022 1150   ALT 12 04/05/2022 1150   BILITOT 0.3 04/05/2022 1150

## 2022-04-19 NOTE — Patient Instructions (Signed)
[facility]  Discharge Instructions: Thank you for choosing [facility] to provide your oncology and hematology care.   If you have a lab appointment with the [facility], please go directly to the [facility] and check in at the registration area.   Wear comfortable clothing and clothing appropriate for easy access to any Portacath or PICC line.   We strive to give you quality time with your provider. You may need to reschedule your appointment if you arrive late (15 or more minutes).  Arriving late affects you and other patients whose appointments are after yours.  Also, if you miss three or more appointments without notifying the office, you may be dismissed from the clinic at the provider's discretion.      For prescription refill requests, have your pharmacy contact our office and allow 72 hours for refills to be completed.    Today you received the following chemotherapy and/or immunotherapy agents: Gemzar and Docetaxel      To help prevent nausea and vomiting after your treatment, we encourage you to take your nausea medication as directed.  BELOW ARE SYMPTOMS THAT SHOULD BE REPORTED IMMEDIATELY: *FEVER GREATER THAN 100.4 F (38 C) OR HIGHER *CHILLS OR SWEATING *NAUSEA AND VOMITING THAT IS NOT CONTROLLED WITH YOUR NAUSEA MEDICATION *UNUSUAL SHORTNESS OF BREATH *UNUSUAL BRUISING OR BLEEDING *URINARY PROBLEMS (pain or burning when urinating, or frequent urination) *BOWEL PROBLEMS (unusual diarrhea, constipation, pain near the anus) TENDERNESS IN MOUTH AND THROAT WITH OR WITHOUT PRESENCE OF ULCERS (sore throat, sores in mouth, or a toothache) UNUSUAL RASH, SWELLING OR PAIN  UNUSUAL VAGINAL DISCHARGE OR ITCHING   Items with * indicate a potential emergency and should be followed up as soon as possible or go to the Emergency Department if any problems should occur.  Please show the CHEMOTHERAPY ALERT CARD or IMMUNOTHERAPY ALERT CARD at  check-in to the Emergency Department and triage nurse.  Should you have questions after your visit or need to cancel or reschedule your appointment, please contact [facility]  Dept: [phone]  and follow the prompts.  Office hours are 8:00 a.m. to 4:30 p.m. Monday - Friday. Please note that voicemails left after 4:00 p.m. may not be returned until the following business day.  We are closed weekends and major holidays. You have access to a nurse at all times for urgent questions. Please call the main number to the clinic Dept: [phone] and follow the prompts.   For any non-urgent questions, you may also contact your provider using MyChart. We now offer e-Visits for anyone 48 and older to request care online for non-urgent symptoms. For details visit [email or web address].   Also download the MyChart app! Go to the app store, search "MyChart", open the app, select [facility], and log in with your MyChart username and password.  Due to Covid, a mask is required upon entering the hospital/clinic. If you do not have a mask, one will be given to you upon arrival. For doctor visits, patients may have 1 support person aged 10 or older with them. For treatment visits, patients cannot have anyone with them due to current Covid guidelines and our immunocompromised population.

## 2022-04-20 ENCOUNTER — Telehealth: Payer: Self-pay | Admitting: *Deleted

## 2022-04-20 NOTE — Telephone Encounter (Signed)
RETURNED PATIENT'S PHONE CALL, SPOKE WITH PATIENT. ?

## 2022-04-21 ENCOUNTER — Other Ambulatory Visit: Payer: Self-pay

## 2022-04-21 ENCOUNTER — Inpatient Hospital Stay: Payer: BC Managed Care – PPO

## 2022-04-21 VITALS — BP 124/70 | HR 82 | Temp 98.7°F | Resp 16

## 2022-04-21 DIAGNOSIS — C55 Malignant neoplasm of uterus, part unspecified: Secondary | ICD-10-CM | POA: Diagnosis not present

## 2022-04-21 DIAGNOSIS — C7800 Secondary malignant neoplasm of unspecified lung: Secondary | ICD-10-CM

## 2022-04-21 MED ORDER — PEGFILGRASTIM-CBQV 6 MG/0.6ML ~~LOC~~ SOSY
6.0000 mg | PREFILLED_SYRINGE | Freq: Once | SUBCUTANEOUS | Status: AC
  Administered 2022-04-21: 6 mg via SUBCUTANEOUS
  Filled 2022-04-21: qty 0.6

## 2022-04-22 ENCOUNTER — Ambulatory Visit: Payer: BC Managed Care – PPO | Admitting: Radiation Oncology

## 2022-04-24 NOTE — Progress Notes (Signed)
Radiation Oncology         [phone] ________________________________  Name: Tammie Gilmore MRN: [id number]  Date: 04/26/2022  DOB: 06/28/1966  Follow-Up Visit Note  CC: Curlene Labrum, MD  [name], MD  No diagnosis found.  Diagnosis:  The encounter diagnosis was Metastasis to bone [facility]).   Metastatic uterine leiomyosarcoma; pulmonary and osseous metastasis (L3)    Cancer Staging  Uterine leiomyosarcoma ([place]) Staging form: Corpus Uteri - Leiomyosarcoma and Endometrial Stromal Sarcoma, AJCC 8th Edition - Pathologic stage from 08/06/2021: FIGO Stage IVB (pT3, pN1, cM1) - Signed by [name], MD on 08/11/2021  Interval Since Last Radiation:  1 month and 4 days   Intent: Palliative  Radiation Treatment Dates: 03/10/2022 through 03/23/2022 Site Technique Total Dose (Gy) Dose per Fx (Gy) Completed Fx Beam Energies  Lumbar Spine: Spine 3D 30/30 3 10/10 10X, 15X    Narrative:  The patient returns today for routine follow-up.  The patient tolerated radiation therapy relatively well. During her final weekly treatment check on 03/16/22, the patient reported increase bowel gas for which she given simethicone. She also took stool softener nightly. She denied any other symptoms.   Per her most recent follow up visit with Dr. Alvy Bimler on 04/19/22, the patient is tolerating chemotherapy well other than anemia for which she is asymptomatic for, mild mucositis, and mild peripheral neuropathy.                               Otherwise, no significant interval history since the patient was seen for her initial consultation.   ***   Allergies:  is allergic to doxycycline.  Meds: Current Outpatient Medications  Medication Sig Dispense Refill   acetaminophen (TYLENOL) 500 MG tablet Take 1,000 mg by mouth every 6 (six) hours as needed for moderate pain or headache.     bisacodyl 5 MG EC tablet Take 1 tablet (5 mg total) by mouth daily as needed for moderate constipation. 30 tablet 3    dexamethasone (DECADRON) 4 MG tablet Take 2 tablets (8 mg total) by mouth daily. Start the day before Taxotere. Then daily after chemo for 2 days. 30 tablet 1   estradiol (ESTRACE) 0.1 MG/GM vaginal cream PLACE FINGER TIP SIZE AMOUNT OF CREAM AND INSERT SLIGHTLY PAST THE VAGINAL ENTRANCE 3 TIMES DAILY 126 g 4   lidocaine (XYLOCAINE) 2 % solution SMARTSIG:By Mouth     lidocaine-prilocaine (EMLA) cream Apply to affected area once 30 g 3   loratadine (CLARITIN) 10 MG tablet Take 10 mg by mouth daily as needed (for bone aches).     LORazepam (ATIVAN) 0.5 MG tablet Take 1 tablet (0.5 mg total) by mouth 2 (two) times daily as needed for anxiety. 30 tablet 0   magic mouthwash (nystatin, diphenhydrAMINE, alum & mag hydroxide) suspension mixture Swish and spit 5 mLs 4 times daily. 240 mL 0   metoprolol succinate (TOPROL XL) 25 MG 24 hr tablet Take 1 tablet (25 mg total) by mouth at bedtime. 30 tablet 6   ondansetron (ZOFRAN) 8 MG tablet Take 1 tablet (8 mg total) by mouth every 8 (eight) hours as needed. 30 tablet 1   oxyCODONE (OXY IR/ROXICODONE) 5 MG immediate release tablet Take 1 tablet (5 mg total) by mouth every 4 (four) hours as needed for severe pain. 30 tablet 0   prochlorperazine (COMPAZINE) 10 MG tablet Take 1 tablet (10 mg total) by mouth every 6 (six) hours  as needed (Nausea or vomiting). 90 tablet 1   senna (SENOKOT) 8.6 MG TABS tablet Take 2 tablets by mouth at bedtime.     No current facility-administered medications for this encounter.    Physical Findings: The patient is in no acute distress. Patient is alert and oriented.  vitals were not taken for this visit. .  No significant changes. Lungs are clear to auscultation bilaterally. Heart has regular rate and rhythm. No palpable cervical, supraclavicular, or axillary adenopathy. Abdomen soft, non-tender, normal bowel sounds. ***   Lab Findings: Lab Results  Component Value Date   WBC 9.6 04/19/2022   HGB 11.1 (L) 04/19/2022   HCT  33.4 (L) 04/19/2022   MCV 96.5 04/19/2022   PLT 223 04/19/2022    Radiographic Findings: No results found.  Impression: The encounter diagnosis was Metastasis to bone [facility]).   Metastatic uterine leiomyosarcoma; pulmonary and osseous metastasis (L3)    The patient is recovering from the effects of radiation.  ***  Plan:  ***   *** minutes of total time was spent for this patient encounter, including preparation, face-to-face counseling with the patient and coordination of care, physical exam, and documentation of the encounter. ____________________________________  Blair Promise, PhD, MD  This document serves as a record of services personally performed by Gery Pray, MD. It was created on his behalf by Roney Mans, a trained medical scribe. The creation of this record is based on the scribe's personal observations and the provider's statements to them. This document has been checked and approved by the attending provider.

## 2022-04-24 NOTE — Progress Notes (Incomplete)
  Radiation Oncology         [phone] ________________________________  Patient Name: Tammie Gilmore MRN: [id number] DOB: 1966/09/10 Referring Physician: [facility] (Profile Not Attached) Date of Service: 03/23/2022 [facility], [place]                                                        End Of Treatment Note  Diagnoses: C79.51-Secondary malignant neoplasm of bone  Cancer Staging: The encounter diagnosis was Metastasis to bone [facility]).   Metastatic uterine leiomyosarcoma; pulmonary and osseous metastasis (L3)   Cancer Staging  Uterine leiomyosarcoma ([place]) Staging form: Corpus Uteri - Leiomyosarcoma and Endometrial Stromal Sarcoma, AJCC 8th Edition - Pathologic stage from 08/06/2021: FIGO Stage IVB (pT3, pN1, cM1) - Signed by [name], MD on 08/11/2021   Intent: Palliative  Radiation Treatment Dates: 03/10/2022 through 03/23/2022 Site Technique Total Dose (Gy) Dose per Fx (Gy) Completed Fx Beam Energies  Lumbar Spine: Spine 3D 30/30 3 10/10 10X, 15X   Narrative: The patient tolerated radiation therapy relatively well. During her final weekly treatment check on 03/16/22, the patient reported increase bowel gas for which she given simethicone. She also took stool softener at night. She denied any other symptoms.   Plan: The patient will follow-up with radiation oncology in one month .  ________________________________________________ -----------------------------------  Blair Promise, PhD, MD  This document serves as a record of services personally performed by Gery Pray, MD. It was created on his behalf by Roney Mans, a trained medical scribe. The creation of this record is based on the scribe's personal observations and the provider's statements to them. This document has been checked and approved by the attending provider.

## 2022-04-26 ENCOUNTER — Ambulatory Visit
Admission: RE | Admit: 2022-04-26 | Discharge: 2022-04-26 | Disposition: A | Discharge status: Home / Self Care | Payer: BC Managed Care – PPO | Source: Ambulatory Visit | Attending: Radiation Oncology | Admitting: Radiation Oncology

## 2022-04-26 ENCOUNTER — Other Ambulatory Visit: Payer: Self-pay | Admitting: *Deleted

## 2022-04-26 ENCOUNTER — Telehealth: Payer: Self-pay | Admitting: *Deleted

## 2022-04-26 ENCOUNTER — Encounter: Payer: Self-pay | Admitting: Radiation Oncology

## 2022-04-26 ENCOUNTER — Other Ambulatory Visit: Payer: Self-pay

## 2022-04-26 DIAGNOSIS — C7951 Secondary malignant neoplasm of bone: Secondary | ICD-10-CM | POA: Insufficient documentation

## 2022-04-26 DIAGNOSIS — C541 Malignant neoplasm of endometrium: Secondary | ICD-10-CM | POA: Diagnosis not present

## 2022-04-26 DIAGNOSIS — G629 Polyneuropathy, unspecified: Secondary | ICD-10-CM | POA: Insufficient documentation

## 2022-04-26 DIAGNOSIS — Z79899 Other long term (current) drug therapy: Secondary | ICD-10-CM | POA: Diagnosis not present

## 2022-04-26 DIAGNOSIS — Z923 Personal history of irradiation: Secondary | ICD-10-CM | POA: Insufficient documentation

## 2022-04-26 DIAGNOSIS — Z7952 Long term (current) use of systemic steroids: Secondary | ICD-10-CM | POA: Diagnosis not present

## 2022-04-26 NOTE — Progress Notes (Signed)
Patient in after receiving radiation treatments to Lumbar spine. Patient completed treatment on 03/23/22. Patient denies pain at this time, reports achy bones at times. Patient takes Claritin as directed for achy bones, medication has not been effective. Patient reports having chemo last week  and she has been increased weakness. Patient reports using wheel chair at times when she has leg weakness. Patient reports low energy level and decreased appetite.  Patient states she has a rash to back. Patient states her husband was recently diagnosed with shingles.    BP 114/81   Pulse (!) 110   Temp (!) 97 F (36.1 C)   Resp 18   Ht '5\' 8"'$  (1.727 m)   Wt 122 lb 9.6 oz (55.6 kg)   SpO2 100%   BMI 18.64 kg/m

## 2022-04-26 NOTE — Telephone Encounter (Signed)
Patient called - described dark red to light pink rash developed yesterday 04/25/22 in middle of back. It is warm to touch, itchy and has a few darker spots in it. Her husband diagnosed Saturday with shingles - he thought it was poison oak. Patient is concerned her rash might be shingles. She has f/u appt today with Dr. Sondra Come in [place] and wondered if Dr. Alvy Bimler might be able to take a look at rash while patient is in building. Dr. Alvy Bimler informed of patient request. Dr. Alvy Bimler recommended patient have picture taken of her back and send via [place]. Called patient and LVM with Dr. Alvy Bimler recommendation. Patient called and LVM in office: Dr. Sondra Come looked at her back. Per patient, he told her he thought it was radiation related and was irritated by chemo infusions. He did not think it was shingles. Dr. Alvy Bimler informed

## 2022-04-27 ENCOUNTER — Telehealth: Payer: Self-pay

## 2022-04-27 ENCOUNTER — Other Ambulatory Visit: Payer: Self-pay

## 2022-04-27 MED ORDER — NYSTATIN 100000 UNIT/ML MT SUSP: 5.0000 mL | Freq: Four times a day (QID) | OROMUCOSAL | 0 refills | Status: DC | PRN

## 2022-04-27 NOTE — Telephone Encounter (Signed)
Patient called and informed that prescription for magic mouth wash had been called into [facility] on her behalf. Pharmacy will be calling patient when prescription is ready for pick-up. Patient appreciative of call.

## 2022-04-27 NOTE — Telephone Encounter (Signed)
-----   Message from [name], MD sent at 04/27/2022  8:07 AM EDT ----- Regarding: RE: MM [facility]/ L[facility]oc[facility],  I do not suggest viscous lidocaine Please let her know [facility] cannot compound drugs Please call [facility] and see if they have lidocaine restock; if they do not have it call her back and let her know we do not have it ----- Message ----- From: Rolland Bimler, RN Sent: 04/26/2022   7:10 PM EDT To: [name], MD; [facility] 1 Subject: MM w/ Lidocaine not available at [facility] in [place]  Hi - Patient wanted magic mouthwash with lidocaine and said in a phone message that the [facility] [place] [facility] the viscous lidocaine (apparently its been on back order).  Patient has magic mouthwash WITHOUT lidocaine at present.  Per Dr. Alvy Bimler, ok to order MM w/ new recipe.  [facility] [place]. They are not a compounding pharmacy.  They can only offer the viscous lidocaine as a standalone prescription.  Mixing is considered compounding and they are not a compounding pharmacy.  Please let patient know. Thanks, [facility]

## 2022-04-27 NOTE — Telephone Encounter (Signed)
Patient informed that the [facility] in [place] is not able to compound the magic mouthwash. RN reached out to [facility] and they do not currently have viscous lidocaine in stock to compound.  Patient made aware of the situation. Patient states that she will call other pharmacies in the area and see if they have any lidocaine available to compound the medication. Patient states that she will call back if she is able to locate a pharmacy who is able to fill the medication.  All questions answered during call.

## 2022-05-03 ENCOUNTER — Inpatient Hospital Stay: Payer: BC Managed Care – PPO

## 2022-05-03 ENCOUNTER — Other Ambulatory Visit: Payer: Self-pay

## 2022-05-03 VITALS — BP 131/81 | HR 82 | Temp 98.4°F | Resp 18 | Ht 68.0 in | Wt 126.5 lb

## 2022-05-03 DIAGNOSIS — C7801 Secondary malignant neoplasm of right lung: Secondary | ICD-10-CM

## 2022-05-03 DIAGNOSIS — C55 Malignant neoplasm of uterus, part unspecified: Secondary | ICD-10-CM | POA: Diagnosis not present

## 2022-05-03 DIAGNOSIS — C7800 Secondary malignant neoplasm of unspecified lung: Secondary | ICD-10-CM

## 2022-05-03 DIAGNOSIS — D61818 Other pancytopenia: Secondary | ICD-10-CM

## 2022-05-03 LAB — CBC WITH DIFFERENTIAL (CANCER CENTER ONLY)
Abs Immature Granulocytes: 0.2 10*3/uL — ABNORMAL HIGH (ref 0.00–0.07)
Basophils Absolute: 0.1 10*3/uL (ref 0.0–0.1)
Basophils Relative: 1 %
Eosinophils Absolute: 0.1 10*3/uL (ref 0.0–0.5)
Eosinophils Relative: 1 %
HCT: 33.4 % — ABNORMAL LOW (ref 36.0–46.0)
Hemoglobin: 11 g/dL — ABNORMAL LOW (ref 12.0–15.0)
Immature Granulocytes: 2 %
Lymphocytes Relative: 9 %
Lymphs Abs: 0.8 10*3/uL (ref 0.7–4.0)
MCH: 32.1 pg (ref 26.0–34.0)
MCHC: 32.9 g/dL (ref 30.0–36.0)
MCV: 97.4 fL (ref 80.0–100.0)
Monocytes Absolute: 0.6 10*3/uL (ref 0.1–1.0)
Monocytes Relative: 6 %
Neutro Abs: 7.4 10*3/uL (ref 1.7–7.7)
Neutrophils Relative %: 81 %
Platelet Count: 216 10*3/uL (ref 150–400)
RBC: 3.43 MIL/uL — ABNORMAL LOW (ref 3.87–5.11)
RDW: 16.2 % — ABNORMAL HIGH (ref 11.5–15.5)
WBC Count: 9.1 10*3/uL (ref 4.0–10.5)
nRBC: 0 % (ref 0.0–0.2)

## 2022-05-03 LAB — CMP (CANCER CENTER ONLY)
ALT: 11 U/L (ref 0–44)
AST: 15 U/L (ref 15–41)
Albumin: 3.3 g/dL — ABNORMAL LOW (ref 3.5–5.0)
Alkaline Phosphatase: 80 U/L (ref 38–126)
Anion gap: 5 (ref 5–15)
BUN: 10 mg/dL (ref 6–20)
CO2: 30 mmol/L (ref 22–32)
Calcium: 8.7 mg/dL — ABNORMAL LOW (ref 8.9–10.3)
Chloride: 106 mmol/L (ref 98–111)
Creatinine: 0.6 mg/dL (ref 0.44–1.00)
GFR, Estimated: >60 mL/min (ref >60)
Glucose, Bld: 118 mg/dL — ABNORMAL HIGH (ref 70–99)
Potassium: 3.6 mmol/L (ref 3.5–5.1)
Sodium: 141 mmol/L (ref 135–145)
Total Bilirubin: 0.3 mg/dL (ref 0.3–1.2)
Total Protein: 5.6 g/dL — ABNORMAL LOW (ref 6.5–8.1)

## 2022-05-03 LAB — SAMPLE TO BLOOD BANK

## 2022-05-03 MED ORDER — SODIUM CHLORIDE 0.9 % IV SOLN
720.0000 mg/m2 | Freq: Once | INTRAVENOUS | Status: AC
  Administered 2022-05-03: 1216 mg via INTRAVENOUS
  Filled 2022-05-03: qty 31.98

## 2022-05-03 MED ORDER — SODIUM CHLORIDE 0.9% FLUSH
10.0000 mL | INTRAVENOUS | Status: DC | PRN
  Administered 2022-05-03: 10 mL

## 2022-05-03 MED ORDER — HEPARIN SOD (PORK) LOCK FLUSH 100 UNIT/ML IV SOLN
500.0000 [IU] | Freq: Once | INTRAVENOUS | Status: AC | PRN
  Administered 2022-05-03: 500 [IU]

## 2022-05-03 MED ORDER — SODIUM CHLORIDE 0.9 % IV SOLN: Freq: Once | INTRAVENOUS | Status: AC

## 2022-05-03 MED ORDER — PROCHLORPERAZINE MALEATE 10 MG PO TABS
10.0000 mg | ORAL_TABLET | Freq: Once | ORAL | Status: AC
  Administered 2022-05-03: 10 mg via ORAL
  Filled 2022-05-03: qty 1

## 2022-05-03 MED ORDER — SODIUM CHLORIDE 0.9% FLUSH
10.0000 mL | Freq: Once | INTRAVENOUS | Status: AC
  Administered 2022-05-03: 10 mL

## 2022-05-03 NOTE — Patient Instructions (Signed)
[facility]  Discharge Instructions: Thank you for choosing [facility] to provide your oncology and hematology care.   If you have a lab appointment with the [facility], please go directly to the [facility] and check in at the registration area.   Wear comfortable clothing and clothing appropriate for easy access to any Portacath or PICC line.   We strive to give you quality time with your provider. You may need to reschedule your appointment if you arrive late (15 or more minutes).  Arriving late affects you and other patients whose appointments are after yours.  Also, if you miss three or more appointments without notifying the office, you may be dismissed from the clinic at the provider's discretion.      For prescription refill requests, have your pharmacy contact our office and allow 72 hours for refills to be completed.    Today you received the following chemotherapy and/or immunotherapy agents Gemzar      To help prevent nausea and vomiting after your treatment, we encourage you to take your nausea medication as directed.  BELOW ARE SYMPTOMS THAT SHOULD BE REPORTED IMMEDIATELY: *FEVER GREATER THAN 100.4 F (38 C) OR HIGHER *CHILLS OR SWEATING *NAUSEA AND VOMITING THAT IS NOT CONTROLLED WITH YOUR NAUSEA MEDICATION *UNUSUAL SHORTNESS OF BREATH *UNUSUAL BRUISING OR BLEEDING *URINARY PROBLEMS (pain or burning when urinating, or frequent urination) *BOWEL PROBLEMS (unusual diarrhea, constipation, pain near the anus) TENDERNESS IN MOUTH AND THROAT WITH OR WITHOUT PRESENCE OF ULCERS (sore throat, sores in mouth, or a toothache) UNUSUAL RASH, SWELLING OR PAIN  UNUSUAL VAGINAL DISCHARGE OR ITCHING   Items with * indicate a potential emergency and should be followed up as soon as possible or go to the Emergency Department if any problems should occur.  Please show the CHEMOTHERAPY ALERT CARD or IMMUNOTHERAPY ALERT CARD at check-in to the  Emergency Department and triage nurse.  Should you have questions after your visit or need to cancel or reschedule your appointment, please contact [facility]  Dept: [phone]  and follow the prompts.  Office hours are 8:00 a.m. to 4:30 p.m. Monday - Friday. Please note that voicemails left after 4:00 p.m. may not be returned until the following business day.  We are closed weekends and major holidays. You have access to a nurse at all times for urgent questions. Please call the main number to the clinic Dept: [phone] and follow the prompts.   For any non-urgent questions, you may also contact your provider using MyChart. We now offer e-Visits for anyone 49 and older to request care online for non-urgent symptoms. For details visit [email or web address].   Also download the MyChart app! Go to the app store, search "MyChart", open the app, select [facility], and log in with your MyChart username and password.  Masks are optional in the cancer centers. If you would like for your care team to wear a mask while they are taking care of you, please let them know. For doctor visits, patients may have with them one support person who is at least 56 years old. At this time, visitors are not allowed in the infusion area. Gemcitabine injection What is this medication? GEMCITABINE (jem SYE ta been) is a chemotherapy drug. This medicine is used to treat many types of cancer like breast cancer, lung cancer, pancreatic cancer, and ovarian cancer. This medicine may be used for other purposes; ask your health care provider or pharmacist if  you have questions. COMMON BRAND NAME(S): Gemzar, Infugem What should I tell my care team before I take this medication? They need to know if you have any of these conditions: blood disorders infection kidney disease liver disease lung or breathing disease, like asthma recent or ongoing radiation therapy an unusual or allergic  reaction to gemcitabine, other chemotherapy, other medicines, foods, dyes, or preservatives pregnant or trying to get pregnant breast-feeding How should I use this medication? This drug is given as an infusion into a vein. It is administered in a hospital or clinic by a specially trained health care professional. Talk to your pediatrician regarding the use of this medicine in children. Special care may be needed. Overdosage: If you think you have taken too much of this medicine contact a poison control center or emergency room at once. NOTE: This medicine is only for you. Do not share this medicine with others. What if I miss a dose? It is important not to miss your dose. Call your doctor or health care professional if you are unable to keep an appointment. What may interact with this medication? medicines to increase blood counts like filgrastim, pegfilgrastim, sargramostim some other chemotherapy drugs like cisplatin vaccines Talk to your doctor or health care professional before taking any of these medicines: acetaminophen aspirin ibuprofen ketoprofen naproxen This list may not describe all possible interactions. Give your health care provider a list of all the medicines, herbs, non-prescription drugs, or dietary supplements you use. Also tell them if you smoke, drink alcohol, or use illegal drugs. Some items may interact with your medicine. What should I watch for while using this medication? Visit your doctor for checks on your progress. This drug may make you feel generally unwell. This is not uncommon, as chemotherapy can affect healthy cells as well as cancer cells. Report any side effects. Continue your course of treatment even though you feel ill unless your doctor tells you to stop. In some cases, you may be given additional medicines to help with side effects. Follow all directions for their use. Call your doctor or health care professional for advice if you get a fever, chills or  sore throat, or other symptoms of a cold or flu. Do not treat yourself. This drug decreases your body's ability to fight infections. Try to avoid being around people who are sick. This medicine may increase your risk to bruise or bleed. Call your doctor or health care professional if you notice any unusual bleeding. Be careful brushing and flossing your teeth or using a toothpick because you may get an infection or bleed more easily. If you have any dental work done, tell your dentist you are receiving this medicine. Avoid taking products that contain aspirin, acetaminophen, ibuprofen, naproxen, or ketoprofen unless instructed by your doctor. These medicines may hide a fever. Do not become pregnant while taking this medicine or for 6 months after stopping it. Women should inform their doctor if they wish to become pregnant or think they might be pregnant. Men should not father a child while taking this medicine and for 3 months after stopping it. There is a potential for serious side effects to an unborn child. Talk to your health care professional or pharmacist for more information. Do not breast-feed an infant while taking this medicine or for at least 1 week after stopping it. Men should inform their doctors if they wish to father a child. This medicine may lower sperm counts. Talk with your doctor or health care professional if  you are concerned about your fertility. What side effects may I notice from receiving this medication? Side effects that you should report to your doctor or health care professional as soon as possible: allergic reactions like skin rash, itching or hives, swelling of the face, lips, or tongue breathing problems pain, redness, or irritation at site where injected signs and symptoms of a dangerous change in heartbeat or heart rhythm like chest pain; dizziness; fast or irregular heartbeat; palpitations; feeling faint or lightheaded, falls; breathing problems signs of decreased  platelets or bleeding - bruising, pinpoint red spots on the skin, black, tarry stools, blood in the urine signs of decreased red blood cells - unusually weak or tired, feeling faint or lightheaded, falls signs of infection - fever or chills, cough, sore throat, pain or difficulty passing urine signs and symptoms of kidney injury like trouble passing urine or change in the amount of urine signs and symptoms of liver injury like dark yellow or brown urine; general ill feeling or flu-like symptoms; light-colored stools; loss of appetite; nausea; right upper belly pain; unusually weak or tired; yellowing of the eyes or skin swelling of ankles, feet, hands Side effects that usually do not require medical attention (report to your doctor or health care professional if they continue or are bothersome): constipation diarrhea hair loss loss of appetite nausea rash vomiting This list may not describe all possible side effects. Call your doctor for medical advice about side effects. You may report side effects to FDA at 1-800-FDA-1088. Where should I keep my medication? This drug is given in a hospital or clinic and will not be stored at home. NOTE: This sheet is a summary. It may not cover all possible information. If you have questions about this medicine, talk to your doctor, pharmacist, or health care provider.  2023 Elsevier/Gold Standard (2017-12-21 00:00:00)

## 2022-05-14 MED FILL — Dexamethasone Sodium Phosphate Inj 100 MG/10ML: INTRAMUSCULAR | Qty: 1 | Status: AC

## 2022-05-17 ENCOUNTER — Inpatient Hospital Stay: Payer: BC Managed Care – PPO

## 2022-05-17 ENCOUNTER — Inpatient Hospital Stay: Payer: BC Managed Care – PPO | Attending: Gynecologic Oncology | Admitting: Hematology and Oncology

## 2022-05-17 ENCOUNTER — Encounter: Payer: Self-pay | Admitting: Hematology and Oncology

## 2022-05-17 VITALS — BP 136/59 | HR 85 | Temp 98.4°F | Resp 18 | Ht 68.0 in | Wt 129.2 lb

## 2022-05-17 VITALS — BP 131/78 | HR 85 | Temp 98.5°F | Resp 16

## 2022-05-17 DIAGNOSIS — D61818 Other pancytopenia: Secondary | ICD-10-CM | POA: Diagnosis not present

## 2022-05-17 DIAGNOSIS — C7800 Secondary malignant neoplasm of unspecified lung: Secondary | ICD-10-CM

## 2022-05-17 DIAGNOSIS — C55 Malignant neoplasm of uterus, part unspecified: Secondary | ICD-10-CM | POA: Insufficient documentation

## 2022-05-17 DIAGNOSIS — C7951 Secondary malignant neoplasm of bone: Secondary | ICD-10-CM | POA: Diagnosis not present

## 2022-05-17 DIAGNOSIS — Z7952 Long term (current) use of systemic steroids: Secondary | ICD-10-CM | POA: Insufficient documentation

## 2022-05-17 DIAGNOSIS — R21 Rash and other nonspecific skin eruption: Secondary | ICD-10-CM | POA: Diagnosis not present

## 2022-05-17 DIAGNOSIS — R5381 Other malaise: Secondary | ICD-10-CM | POA: Diagnosis not present

## 2022-05-17 DIAGNOSIS — Z5111 Encounter for antineoplastic chemotherapy: Secondary | ICD-10-CM | POA: Diagnosis not present

## 2022-05-17 DIAGNOSIS — C7801 Secondary malignant neoplasm of right lung: Secondary | ICD-10-CM

## 2022-05-17 DIAGNOSIS — N3090 Cystitis, unspecified without hematuria: Secondary | ICD-10-CM | POA: Diagnosis not present

## 2022-05-17 DIAGNOSIS — T451X5A Adverse effect of antineoplastic and immunosuppressive drugs, initial encounter: Secondary | ICD-10-CM | POA: Diagnosis not present

## 2022-05-17 DIAGNOSIS — G620 Drug-induced polyneuropathy: Secondary | ICD-10-CM | POA: Diagnosis not present

## 2022-05-17 DIAGNOSIS — M62838 Other muscle spasm: Secondary | ICD-10-CM | POA: Diagnosis not present

## 2022-05-17 LAB — CMP (CANCER CENTER ONLY)
ALT: 14 U/L (ref 0–44)
AST: 13 U/L — ABNORMAL LOW (ref 15–41)
Albumin: 3.5 g/dL (ref 3.5–5.0)
Alkaline Phosphatase: 67 U/L (ref 38–126)
Anion gap: 6 (ref 5–15)
BUN: 12 mg/dL (ref 6–20)
CO2: 27 mmol/L (ref 22–32)
Calcium: 8.5 mg/dL — ABNORMAL LOW (ref 8.9–10.3)
Chloride: 108 mmol/L (ref 98–111)
Creatinine: 0.61 mg/dL (ref 0.44–1.00)
GFR, Estimated: >60 mL/min (ref >60)
Glucose, Bld: 156 mg/dL — ABNORMAL HIGH (ref 70–99)
Potassium: 3.4 mmol/L — ABNORMAL LOW (ref 3.5–5.1)
Sodium: 141 mmol/L (ref 135–145)
Total Bilirubin: 0.4 mg/dL (ref 0.3–1.2)
Total Protein: 6 g/dL — ABNORMAL LOW (ref 6.5–8.1)

## 2022-05-17 LAB — CBC WITH DIFFERENTIAL (CANCER CENTER ONLY)
Abs Immature Granulocytes: 0.02 10*3/uL (ref 0.00–0.07)
Basophils Absolute: 0 10*3/uL (ref 0.0–0.1)
Basophils Relative: 0 %
Eosinophils Absolute: 0 10*3/uL (ref 0.0–0.5)
Eosinophils Relative: 0 %
HCT: 32.1 % — ABNORMAL LOW (ref 36.0–46.0)
Hemoglobin: 10.6 g/dL — ABNORMAL LOW (ref 12.0–15.0)
Immature Granulocytes: 0 %
Lymphocytes Relative: 9 %
Lymphs Abs: 0.5 10*3/uL — ABNORMAL LOW (ref 0.7–4.0)
MCH: 32.1 pg (ref 26.0–34.0)
MCHC: 33 g/dL (ref 30.0–36.0)
MCV: 97.3 fL (ref 80.0–100.0)
Monocytes Absolute: 0.5 10*3/uL (ref 0.1–1.0)
Monocytes Relative: 9 %
Neutro Abs: 4.4 10*3/uL (ref 1.7–7.7)
Neutrophils Relative %: 82 %
Platelet Count: 229 10*3/uL (ref 150–400)
RBC: 3.3 MIL/uL — ABNORMAL LOW (ref 3.87–5.11)
RDW: 17.8 % — ABNORMAL HIGH (ref 11.5–15.5)
WBC Count: 5.4 10*3/uL (ref 4.0–10.5)
nRBC: 0 % (ref 0.0–0.2)

## 2022-05-17 LAB — SAMPLE TO BLOOD BANK

## 2022-05-17 MED ORDER — SODIUM CHLORIDE 0.9 % IV SOLN: Freq: Once | INTRAVENOUS | Status: AC

## 2022-05-17 MED ORDER — SODIUM CHLORIDE 0.9 % IV SOLN
720.0000 mg/m2 | Freq: Once | INTRAVENOUS | Status: AC
  Administered 2022-05-17: 1216 mg via INTRAVENOUS
  Filled 2022-05-17: qty 31.98

## 2022-05-17 MED ORDER — SODIUM CHLORIDE 0.9% FLUSH
10.0000 mL | Freq: Once | INTRAVENOUS | Status: AC
  Administered 2022-05-17: 10 mL

## 2022-05-17 MED ORDER — HEPARIN SOD (PORK) LOCK FLUSH 100 UNIT/ML IV SOLN
500.0000 [IU] | Freq: Once | INTRAVENOUS | Status: AC | PRN
  Administered 2022-05-17: 500 [IU]

## 2022-05-17 MED ORDER — SODIUM CHLORIDE 0.9 % IV SOLN
80.0000 mg/m2 | Freq: Once | INTRAVENOUS | Status: AC
  Administered 2022-05-17: 140 mg via INTRAVENOUS
  Filled 2022-05-17: qty 14

## 2022-05-17 MED ORDER — SODIUM CHLORIDE 0.9 % IV SOLN
10.0000 mg | Freq: Once | INTRAVENOUS | Status: AC
  Administered 2022-05-17: 10 mg via INTRAVENOUS
  Filled 2022-05-17: qty 10

## 2022-05-17 MED ORDER — SODIUM CHLORIDE 0.9% FLUSH
10.0000 mL | INTRAVENOUS | Status: DC | PRN
  Administered 2022-05-17: 10 mL

## 2022-05-17 NOTE — Assessment & Plan Note (Signed)
She has mild anemia but not symptomatic We will proceed with treatment without delay I recommend repeat imaging study next week for objective assessment of response to treatment

## 2022-05-17 NOTE — Progress Notes (Signed)
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 It is only mild, not bothering the patient. I will observe for now If it gets worse in the future, I will consider modifying the dose of the treatment   Orders Placed This Encounter  Procedures   CT CHEST ABDOMEN PELVIS W CONTRAST    Standing Status:   Future    Standing Expiration Date:   05/18/2023    Order Specific Question:   Preferred imaging location?    Answer:   [facility]    Order Specific Question:   Radiology Contrast Protocol - do NOT remove file path    Answer:   [email or web address]    Order Specific Question:   Is patient pregnant?    Answer:   No    All questions were answered. The patient knows to call the clinic with any problems, questions or concerns. The total time spent in the appointment was 20 minutes encounter with patients including review of chart and various tests results, discussions about plan of care and coordination of care plan   [name], MD 05/17/2022 12:50 PM  INTERVAL HISTORY: Please see below for problem oriented charting. she returns for treatment follow-up  She is doing okay She has very mild neuropathy but does not bother her She has intermittent pelvic discomfort that comes and goes She had rash at the back but not bothering her too much  REVIEW OF SYSTEMS:   Constitutional: Denies fevers, chills or abnormal weight  loss Eyes: Denies blurriness of vision Ears, nose, mouth, throat, and face: Denies mucositis or sore throat Respiratory: Denies cough, dyspnea or wheezes Cardiovascular: Denies palpitation, chest discomfort or lower extremity swelling Gastrointestinal:  Denies nausea, heartburn or change in bowel habits Lymphatics: Denies new lymphadenopathy or easy bruising Behavioral/Psych: Mood is stable, no new changes  All other systems were reviewed with the patient and are negative.  I have reviewed the past medical history, past surgical history, social history and family history with the patient and they are unchanged from previous note.  ALLERGIES:  is allergic to doxycycline.  MEDICATIONS:  Current Outpatient Medications  Medication Sig Dispense Refill   acetaminophen (TYLENOL) 500 MG tablet Take 1,000 mg by mouth every 6 (six) hours as needed for moderate pain or headache.     bisacodyl 5 MG EC tablet Take 1 tablet (5 mg total) by mouth daily as needed for moderate constipation. 30 tablet 3   dexamethasone (DECADRON) 4 MG tablet Take 2 tablets (8 mg total) by mouth daily. Start the day before Taxotere. Then daily after chemo for 2 days. 30 tablet 1   estradiol (ESTRACE) 0.1 MG/GM vaginal cream PLACE FINGER TIP SIZE AMOUNT OF CREAM AND INSERT SLIGHTLY PAST THE VAGINAL ENTRANCE 3 TIMES DAILY 126 g 4   lidocaine (XYLOCAINE) 2 % solution SMARTSIG:By Mouth     lidocaine-prilocaine (EMLA) cream Apply to affected area once 30 g 3   loratadine (CLARITIN) 10 MG tablet Take 10 mg by mouth daily as needed (  for bone aches).     LORazepam (ATIVAN) 0.5 MG tablet Take 1 tablet (0.5 mg total) by mouth 2 (two) times daily as needed for anxiety. (Patient not taking: Reported on 04/26/2022) 30 tablet 0   magic mouthwash (nystatin, diphenhydrAMINE, alum & mag hydroxide) suspension mixture Swish and spit 5 mLs 4 (four) times daily as needed for mouth pain. 240 mL 0   metoprolol succinate (TOPROL XL) 25 MG 24 hr  tablet Take 1 tablet (25 mg total) by mouth at bedtime. 30 tablet 6   ondansetron (ZOFRAN) 8 MG tablet Take 1 tablet (8 mg total) by mouth every 8 (eight) hours as needed. 30 tablet 1   oxyCODONE (OXY IR/ROXICODONE) 5 MG immediate release tablet Take 1 tablet (5 mg total) by mouth every 4 (four) hours as needed for severe pain. (Patient not taking: Reported on 04/26/2022) 30 tablet 0   prochlorperazine (COMPAZINE) 10 MG tablet Take 1 tablet (10 mg total) by mouth every 6 (six) hours as needed (Nausea or vomiting). 90 tablet 1   senna (SENOKOT) 8.6 MG TABS tablet Take 2 tablets by mouth at bedtime.     No current facility-administered medications for this visit.   Facility-Administered Medications Ordered in Other Visits  Medication Dose Route Frequency Provider Last Rate Last Admin   DOCEtaxel (TAXOTERE) 140 mg in sodium chloride 0.9 % 250 mL chemo infusion  80 mg/m2 (Treatment Plan Recorded) Intravenous Once Alvy Bimler, Maxtyn Nuzum, MD       gemcitabine (GEMZAR) 1,216 mg in sodium chloride 0.9 % 250 mL chemo infusion  720 mg/m2 (Treatment Plan Recorded) Intravenous Once Alvy Bimler, Maximiliano Cromartie, MD       heparin lock flush 100 unit/mL  500 Units Intracatheter Once PRN Alvy Bimler, Kajuana Shareef, MD       sodium chloride flush (NS) 0.9 % injection 10 mL  10 mL Intracatheter PRN Alvy Bimler, Autumn Pruitt, MD        SUMMARY OF ONCOLOGIC HISTORY: Oncology History  Uterine leiomyosarcoma ([place])  06/11/2021 Imaging   1. 9.5 x 7.6 x 9.0 cm complex, partially necrotic, mass involving the lower uterine segment/ cervix. No obvious direct extension into the parametrium.  2. 9 mm left pelvic sidewall lymph node is partially necrotic and worrisome for metastatic adenopathy.  3. No findings for abdominal omental or peritoneal surface disease or adenopathy.  4. Tiny low-attenuation lesion in the pancreatic head, likely benign cyst but attention on follow-up scans is suggested.  5. 2.9 cm fundal fibroid.    06/19/2021 Pathology Results   FINAL MICROSCOPIC  DIAGNOSIS:   A. UTERINE, CERVICAL MASS, BIOPSY:  - Spindle cell malignancy.  - See comment.   COMMENT:  The biopsies consist of endocervical mucosa with stromal edema and one biopsy fragment has a microscopic focus with atypical spindle cells consistent with poorly differentiated malignancy.  The differential  includes a spindle cell malignancy such as sarcomatoid carcinoma and leiomyosarcoma.  Mullerian adenosarcoma is also a consideration but considered less likely   06/23/2021 Imaging   MR pelvis  10 cm uterine mass with central necrosis, which is centered in the cervix and lower uterine segment. Right parametrial involvement is seen as well as suspected invasion of the distal rectum. Differential diagnosis includes cervical carcinoma and uterine leiomyosarcoma.   Mild bilateral iliac lymphadenopathy, highly suspicious for metastatic disease.   2.9 cm subserosal fibroid in the posterior fundus.   Normal appearance of both ovaries.     06/29/2021 PET scan   1. Hypermetabolic necrotic cervical/uterine mass with bilateral external iliac  hypermetabolic lymph nodes. No evidence of distant metastatic disease. 2. 1.5 cm low-attenuation left thyroid nodule. Recommend thyroid ultrasound. (Ref: J Am Coll Radiol. 2015 Feb;12(2): 143-50).   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, pleomorphic, and myxoid areas and associated necrosis (~20%) - Tumor based in cervix and also involves lower uterine segment - Cervicovaginal margin involved by focal invasive leiomyosarcoma (3:00-5:00, A10) as well as tumor in lymphovascular spaces - Leiomyosarcoma involves right and left parametrial tissue and extends to parametrial margins - Extensive lymphovascular space invasion present, including in uterus and parametria - See  synoptic report and comment   Other findings: - Leiomyomata with hyalinization, size up to 3.0 cm - Ovaries and fallopian tubes with no parenchymal involvement by leiomyosarcoma identified, although adnexal lymphovascular space invasion is present   B: Lymph nodes, right pelvic, lymphadenectomy - One of four lymph nodes positive for metastatic leiomyosarcoma (1/4), with extracapsular extension present   C: Lymph nodes, left pelvic, lymphadenectomy - One of four lymph nodes positive for metastatic leiomyosarcoma (1/4), with extracapsular extension present  Immunohistochemical stains are performed on block A11, and demonstrate that the tumor is positive for desmin and CD10, with SMA staining the majority of the spindle cell component but largely negative in the epithelioid / pleomorphic component. OSCAR, pancytokeratin AE1/AE3, HMB45, and PR appear negative in the tumor. ER shows patchy weak staining and myogenin stains rare cells. Block A23 also shows positive desmin and negative OSCAR pancytokeratin. Overall, the findings are most consistent with leiomyosarcoma, with extensive areas that are myxoid, epithelioid, and pleomorphic as well as more typical spindle cell areas within the overall high grade tumor (grade 3 / 3). The tumor is staged as pT2b (involves other pelvic tissues) given the parametrial involvement and pN1 for FIGO stage IIIC.    07/17/2021 Surgery   Date of Surgery: 07/17/21  Preoperative Diagnosis: High Grade Uterine Sarcoma  Postoperative Diagnosis: Same  Procedure(s): Bilateral - RADICAL ABDOMINAL HYSTER, W/BIL TOTAL PELVIC LYMPHADENECTOMY & PARA-AORTIC LYMPH NODE BX W/WO REM TUBE/OVAR VAGINAL HYSTERECTOMY, FOR UTERUS 250 G OR LESS; WITH REPAIR OF ENTEROCELE COLECTOMY, PARTIAL; WITH COLOPROCTOSTOMY (LOW PELVIC ANASTOMOSIS) WITH COLOSTOMY CYSTOURETHROSCOPY, WITH INSERTION OF INDWELLING URETERAL STENT (EG, GIBBONS OR DOUBLE-J TYPE) - Cystourethroscopy - Bilateral ureteral  stent placement - Foley catheter placement  Performing Service: Gynecology Oncology Surgeon(s) and Role: Panel 1: * Lafonda Mosses, MD - Primary * Bernadene Bell, MD - Resident - Assisting * Devonne Doughty, MD - Resident - Assisting Panel 2: * Franchot Erichsen, MD - Primary  Drains:  - Left 6Fr open-ended ureteral access catheter (green) - Right 5Fr open-ended ureteral access catheter (white) - 16Fr foley catheter to drainage  * No implants in log *  Indications: 56 y.o. female with high grade uterine sarcoma. Urology was consulted pre-operatively for placement of bilateral ureteral stents. Risks, benefits, and alternatives of the above procedure were discussed and informed consent was signed.  OperativeFindings:  - Grossly distorted architecture of urinary bladder likely 2/2 pelvic mass with anterolaterally positioned UOs - Successful placement of bilateral open-ended ureteral catheters under direct visualization - Foley catheter placed at case conclusion  Description: The patient was correctly identified in the preop holding area where written informed consent as well potential risk and complication reviewed. She agreed. The patient was brought to the operative suite where a preinduction timeout was  performed. Once correct information was verified, general anesthesia was induced. The patient was then gently placed into dorsal lithotomy position with SCDs in place for VTE prophylaxis. They were prepped and draped in the usual sterile fashion and given appropriate preoperative antibiotics. A second timeout was then performed.   We inserted a 40F rigid cystoscope per urethra with copious lubrication and normal saline irrigation running. We performed cystourethroscopy, which revealed the above findings.  We turned our attention to the left ureteral orifice and canulated it with a sensor wire, using assistance of a 6Fr open-ended catheter. The wire was advanced into the renal  pelvis without difficulty under visual guidance. We then advanced the stent over our wire into the renal pelvis under direct visualization and feel without complication. The wire was subsequently removed.   We then turned our attention to the right ureteral orifice and canulated it with a sensor wire, using assistance of a 5Fr open-ended catheter. The wire was advanced into the renal pelvis without difficulty under visual guidance. We then advanced the stent over our wire into the renal pelvis under direct visualization and feel without complication. The wire was subsequently removed.   A 16Fr straight catheter was placed, with return of urine indicating appropriate position within the bladder. The balloon was inflated with 10cc sterile water. The stents were secured to the Foley using 0-silk ties, being careful not to occlude the stents or Foley.   The patient was awoken from general anesthesia having tolerated the procedure well and taken to the PACU for routine post-operative recovery.  Post-Op Plan:  - Foley and stents per primary team    07/17/2021 Surgery   Date of Surgery: 07/17/2021  Pre-op Diagnosis: Uterine spindle cell malignancy  Post-op Diagnosis: Same  Procedure(s): Panel 1 RADICAL ABDOMINAL HYSTER, with bilateral S&O, vagineconty upper, bilateral pelvic lyphadenectomy, bilateral ureterolysis,: 17001 (CPT) Panel 2 CYSTOURETHROSCOPY, WITH INSERTION OF INDWELLING URETERAL STENT (EG, GIBBONS OR DOUBLE-J TYPE): 74944 (CPT) Note: Revisions to procedures should be made in chart - see Procedures activity.  Performing Service: Gynecology Oncology Surgeon(s) and Role: Panel 1: * Lafonda Mosses, MD - Primary * Bernadene Bell, MD - Resident - Assisting * Devonne Doughty, MD - Resident - Assisting Panel 2: * Franchot Erichsen, MD - Primary  Findings: On bimanual exam, 10cm necrotic mass filling upper vagina, unable to discretely palpate the cervix. On rectovaginal exam,  rectal involvement not identified. Intraoperatively, normal upper abdominal survey including normal liver, diaphragm, stomach, omentum and bowel. Small uterus with 10cm mass expanding the cervix. Palpably enlarged bilateral pelvic lymph nodes adherent to the external iliac veins and obturator nerves, removed. No palpable para-aortic lymphadenopathy. No rectal involvement of uterine mass.   Specimens:  ID Type Source Tests Collected by Time Destination  1 : uterus,cervix,bilateral tubes/ovaries Tissue Uterus SURGICAL PATHOLOGY EXAM Lafonda Mosses, MD 07/17/2021 0932  2 : right pelvic lymph node Tissue Lymph Node SURGICAL PATHOLOGY EXAM Lafonda Mosses, MD 07/17/2021 1125  3 : LEFT PELVIC LN Tissue Lymph Node SURGICAL PATHOLOGY EXAM Lafonda Mosses, MD 07/17/2021 1144    08/06/2021 Initial Diagnosis   Uterine leiomyosarcoma ([place])   08/06/2021 Cancer Staging   Staging form: Corpus Uteri - Leiomyosarcoma and Endometrial Stromal Sarcoma, AJCC 8th Edition - Pathologic stage from 08/06/2021: FIGO Stage IVB (pT3, pN1, cM1) - Signed by [name], MD on 08/11/2021 Stage prefix: Initial diagnosis   08/10/2021 Imaging   CT abdomen and pelvis 1. Interval development of left lobe pulmonary  nodules, measuring up to 7 mm and highly for metastatic disease. 2. Interval development of small to upper normal lymph nodes in the pelvis, concerning for metastatic disease. 3. Postoperative seroma left pelvic sidewall. 4. Tiny cluster of tree-in-bud opacity in the peripheral right lower lobe is new and compatible with sequelae of atypical infection.   08/13/2021 Procedure   Procedure: Placement of a right IJ approach single lumen PowerPort.  Tip is positioned at the superior cavoatrial junction and catheter is ready for immediate use.  Complications: No immediate   08/14/2021 Echocardiogram    1. Left ventricular ejection fraction, by estimation, is 60 to 65%. The left ventricle has normal function. The left  ventricle has no regional wall motion abnormalities. Left ventricular diastolic parameters were normal. The average left ventricular global longitudinal strain is -17.4 %. The global longitudinal strain is normal.  2. Right ventricular systolic function is normal. The right ventricular size is normal.  3. The mitral valve is normal in structure. No evidence of mitral valve regurgitation. No evidence of mitral stenosis.  4. The aortic valve is tricuspid. Aortic valve regurgitation is not visualized. No aortic stenosis is present.  5. The inferior vena cava is normal in size with greater than 50% respiratory variability, suggesting right atrial pressure of 3 mmHg.     08/17/2021 Imaging   Multiple new and enlarging pulmonary nodules scattered throughout the lungs bilaterally, highly concerning for progressive metastatic disease to the lungs   08/18/2021 - 11/10/2021 Chemotherapy   Patient is on Treatment Plan : UTERINE LEIOMYOSARCOMA Doxorubicin q21d x 6 Cycles     11/09/2021 Imaging   IMPRESSION: 1. Multiple small bilateral pulmonary nodules, some of which are slightly increased in size. Other nodules unchanged. 2. Interval decrease in size of left pelvic sidewall lymph nodes. 3. Unchanged size of perirectal lymph nodes or soft tissue nodules. These however demonstrate new internal hypodensity, suggesting treatment response and internal necrosis. 4. Unchanged left iliac lymph node or peritoneal nodule. 5. Findings are consistent with mixed response to treatment. No evidence of new metastatic disease in the chest, abdomen, or pelvis. 6. Wall thickening and mucosal hyperenhancement of the bladder, consistent with nonspecific infectious or inflammatory cystitis. Correlate with urinalysis. 7. Status post hysterectomy and oophorectomy. Interval resolution of a previously noted left pelvic hematoma or seroma. 8. Trace, nonspecific free fluid in the low pelvis.   11/30/2021 Echocardiogram    1. Left  ventricular ejection fraction, by estimation, is 40 to 45%. Left ventricular ejection fraction by 3D volume is 41 %. The left ventricle has mildly decreased function. The left ventricle has no regional wall motion abnormalities. Left ventricular  diastolic parameters are consistent with Grade I diastolic dysfunction (impaired relaxation).  2. Right ventricular systolic function is moderately reduced. The right ventricular size is normal.  3. The mitral valve is grossly normal. No evidence of mitral valve regurgitation.  4. The aortic valve is normal in structure. Aortic valve regurgitation is not visualized. No aortic stenosis is present.     12/07/2021 -  Chemotherapy   Patient is on Treatment Plan : UTERINE UNDIFFERENTIATED / LEIOMYOSARCOMA Gemcitabine D1,8 + Docetaxel D8 (900/100) q21d     02/05/2022 Imaging   Pathologic burst fracture of L3 due to a metastatic lesion, with probable mild degree of right-sided extraosseous tumor extension in the paraspinal soft tissues, and mild involvement of the right pedicle. No epidural/spinal canal involvement.   Multilevel degenerative disc disease without any significant stenosis in the lumbar spine.  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 Multiple small bilateral pulmonary nodules, some of which are slightly increased in size. Other nodules unchanged. 2. Interval decrease in size of left pelvic sidewall lymph nodes. 3. Unchanged size of perirectal lymph nodes or soft tissue nodules. These however demonstrate new internal hypodensity, suggesting treatment response and internal necrosis. 4. Unchanged left iliac lymph node or peritoneal nodule. 5. Findings are consistent with mixed response to treatment. No evidence of new  metastatic disease in the chest, abdomen, or pelvis. 6. Wall thickening and mucosal hyperenhancement of the bladder, consistent with nonspecific infectious or inflammatory cystitis. Correlate with urinalysis. 7. Status post hysterectomy and oophorectomy. Interval resolution of a previously noted left pelvic hematoma or seroma. 8. Trace, nonspecific free fluid in the low pelvis.   12/07/2021 -  Chemotherapy   Patient is on Treatment Plan : UTERINE UNDIFFERENTIATED / LEIOMYOSARCOMA Gemcitabine D1,8 + Docetaxel D8 (900/100) q21d       PHYSICAL EXAMINATION: ECOG PERFORMANCE STATUS: 1 - Symptomatic but completely ambulatory  Vitals:   05/17/22 1129  BP: (!) 136/59  Pulse: 85  Resp: 18  Temp: 98.4 F (36.9 C)  SpO2: 100%   Filed Weights   05/17/22 1129  Weight: 129 lb 3.2 oz (58.6 kg)    GENERAL:alert, no distress and comfortable SKIN: Noted skin discoloration on her back from radiation treatment NEURO: alert & oriented x 3 with fluent speech, no focal motor/sensory deficits  LABORATORY DATA:  I have reviewed the data as listed    Component Value Date/Time   NA 141 05/17/2022 1120   K 3.4 (L) 05/17/2022 1120   CL 108 05/17/2022 1120   CO2 27 05/17/2022 1120   GLUCOSE 156 (H) 05/17/2022 1120   BUN 12 05/17/2022 1120   CREATININE 0.61 05/17/2022 1120   CALCIUM 8.5 (L) 05/17/2022 1120   PROT 6.0 (L) 05/17/2022 1120   ALBUMIN 3.5 05/17/2022 1120   AST 13 (L) 05/17/2022 1120   ALT 14 05/17/2022 1120   ALKPHOS 67 05/17/2022 1120   BILITOT 0.4 05/17/2022 1120   GFRNONAA >60 05/17/2022 1120    No results found for: "SPEP", "UPEP"  Lab Results  Component Value Date   WBC 5.4 05/17/2022   NEUTROABS 4.4 05/17/2022   HGB 10.6 (L) 05/17/2022   HCT 32.1 (L) 05/17/2022   MCV 97.3 05/17/2022   PLT 229 05/17/2022      Chemistry      Component Value Date/Time   NA 141 05/17/2022 1120   K 3.4 (L) 05/17/2022 1120   CL 108 05/17/2022 1120   CO2 27 05/17/2022 1120   BUN 12  05/17/2022 1120   CREATININE 0.61 05/17/2022 1120      Component Value Date/Time   CALCIUM 8.5 (L) 05/17/2022 1120   ALKPHOS 67 05/17/2022 1120   AST 13 (L) 05/17/2022 1120   ALT 14 05/17/2022 1120   BILITOT 0.4 05/17/2022 1120

## 2022-05-17 NOTE — Assessment & Plan Note (Signed)
she has mild peripheral neuropathy, likely related to side effects of treatment. It is only mild, not bothering the patient. I will observe for now If it gets worse in the future, I will consider modifying the dose of the treatment  

## 2022-05-17 NOTE — Assessment & Plan Note (Signed)
She is not symptomatic She has pain medicine to take as needed

## 2022-05-17 NOTE — Patient Instructions (Signed)
[facility]  Discharge Instructions: Thank you for choosing [facility] to provide your oncology and hematology care.   If you have a lab appointment with the [facility], please go directly to the [facility] and check in at the registration area.   Wear comfortable clothing and clothing appropriate for easy access to any Portacath or PICC line.   We strive to give you quality time with your provider. You may need to reschedule your appointment if you arrive late (15 or more minutes).  Arriving late affects you and other patients whose appointments are after yours.  Also, if you miss three or more appointments without notifying the office, you may be dismissed from the clinic at the provider's discretion.      For prescription refill requests, have your pharmacy contact our office and allow 72 hours for refills to be completed.    Today you received the following chemotherapy and/or immunotherapy agents: Docetaxel (Taxotere) and Gemcitabine (Gemzar).   To help prevent nausea and vomiting after your treatment, we encourage you to take your nausea medication as directed.  BELOW ARE SYMPTOMS THAT SHOULD BE REPORTED IMMEDIATELY: *FEVER GREATER THAN 100.4 F (38 C) OR HIGHER *CHILLS OR SWEATING *NAUSEA AND VOMITING THAT IS NOT CONTROLLED WITH YOUR NAUSEA MEDICATION *UNUSUAL SHORTNESS OF BREATH *UNUSUAL BRUISING OR BLEEDING *URINARY PROBLEMS (pain or burning when urinating, or frequent urination) *BOWEL PROBLEMS (unusual diarrhea, constipation, pain near the anus) TENDERNESS IN MOUTH AND THROAT WITH OR WITHOUT PRESENCE OF ULCERS (sore throat, sores in mouth, or a toothache) UNUSUAL RASH, SWELLING OR PAIN  UNUSUAL VAGINAL DISCHARGE OR ITCHING   Items with * indicate a potential emergency and should be followed up as soon as possible or go to the Emergency Department if any problems should occur.  Please show the CHEMOTHERAPY ALERT CARD or  IMMUNOTHERAPY ALERT CARD at check-in to the Emergency Department and triage nurse.  Should you have questions after your visit or need to cancel or reschedule your appointment, please contact [facility]  Dept: [phone]  and follow the prompts.  Office hours are 8:00 a.m. to 4:30 p.m. Monday - Friday. Please note that voicemails left after 4:00 p.m. may not be returned until the following business day.  We are closed weekends and major holidays. You have access to a nurse at all times for urgent questions. Please call the main number to the clinic Dept: [phone] and follow the prompts.   For any non-urgent questions, you may also contact your provider using MyChart. We now offer e-Visits for anyone 32 and older to request care online for non-urgent symptoms. For details visit [email or web address].   Also download the MyChart app! Go to the app store, search "MyChart", open the app, select [facility], and log in with your MyChart username and password.  Masks are optional in the cancer centers. If you would like for your care team to wear a mask while they are taking care of you, please let them know. You may have one support person who is at least 56 years old accompany you for your appointments. Docetaxel Injection What is this medication? DOCETAXEL (doe se TAX el) treats some types of cancer. It works by slowing down the growth of cancer cells. This medicine may be used for other purposes; ask your health care provider or pharmacist if you have questions. COMMON BRAND NAME(S): Docefrez, Taxotere What should I tell my care team before I take this  medication? They need to know if you have any of these conditions: Kidney disease Liver disease Low white blood cell levels Tingling of the fingers or toes or other nerve disorder An unusual or allergic reaction to docetaxel, polysorbate 80, other medications, foods, dyes, or preservatives Pregnant or trying  to get pregnant Breast-feeding How should I use this medication? This medication is injected into a vein. It is given by your care team in a hospital or clinic setting. Talk to your care team about the use of this medication in children. Special care may be needed. Overdosage: If you think you have taken too much of this medicine contact a poison control center or emergency room at once. NOTE: This medicine is only for you. Do not share this medicine with others. What if I miss a dose? Keep appointments for follow-up doses. It is important not to miss your dose. Call your care team if you are unable to keep an appointment. What may interact with this medication? Do not take this medication with any of the following: Live virus vaccines This medication may also interact with the following: Certain antibiotics, such as clarithromycin, telithromycin Certain antivirals for HIV or hepatitis Certain medications for fungal infections, such as itraconazole, ketoconazole, voriconazole Grapefruit juice Nefazodone Supplements, such as St. John's wort This list may not describe all possible interactions. Give your health care provider a list of all the medicines, herbs, non-prescription drugs, or dietary supplements you use. Also tell them if you smoke, drink alcohol, or use illegal drugs. Some items may interact with your medicine. What should I watch for while using this medication? This medication may make you feel generally unwell. This is not uncommon as chemotherapy can affect healthy cells as well as cancer cells. Report any side effects. Continue your course of treatment even though you feel ill unless your care team tells you to stop. You may need blood work done while you are taking this medication. This medication can cause serious side effects and infusion reactions. To reduce the risk, your care team may give you other medications to take before receiving this one. Be sure to follow the  directions from your care team. This medication may increase your risk of getting an infection. Call your care team for advice if you get a fever, chills, sore throat, or other symptoms of a cold or flu. Do not treat yourself. Try to avoid being around people who are sick. Avoid taking medications that contain aspirin, acetaminophen, ibuprofen, naproxen, or ketoprofen unless instructed by your care team. These medications may hide a fever. Be careful brushing or flossing your teeth or using a toothpick because you may get an infection or bleed more easily. If you have any dental work done, tell your dentist you are receiving this medication. Some products may contain alcohol. Ask your care team if this medication contains alcohol. Be sure to tell all care teams you are taking this medicine. Certain medications, like metronidazole and disulfiram, can cause an unpleasant reaction when taken with alcohol. The reaction includes flushing, headache, nausea, vomiting, sweating, and increased thirst. The reaction can last from 30 minutes to several hours. This medication may affect your coordination, reaction time, or judgement. Do not drive or operate machinery until you know how this medication affects you. Sit up or stand slowly to reduce the risk of dizzy or fainting spells. Drinking alcohol with this medication can increase the risk of these side effects. Talk to your care team about your risk of  cancer. You may be more at risk for certain types of cancer if you take this medication. Talk to your care team if you wish to become pregnant or think you might be pregnant. This medication can cause serious birth defects if taken during pregnancy or if you get pregnant within 2 months after stopping therapy. A negative pregnancy test is required before starting this medication. A reliable form of contraception is recommended while taking this medication and for 2 months after stopping it. Talk to your care team about  reliable forms of contraception. Do not breast-feed while taking this medication and for 1 week after stopping therapy. Use a condom during sex and for 4 months after stopping therapy. Tell your care team right away if you think your partner might be pregnant. This medication can cause serious birth defects. This medication may cause infertility. Talk to your care team if you are concerned about your fertility. What side effects may I notice from receiving this medication? Side effects that you should report to your care team as soon as possible: Allergic reactions--skin rash, itching, hives, swelling of the face, lips, tongue, or throat Change in vision such as blurry vision, seeing halos around lights, vision loss Infection--fever, chills, cough, or sore throat Infusion reactions--chest pain, shortness of breath or trouble breathing, feeling faint or lightheaded Low red blood cell level--unusual weakness or fatigue, dizziness, headache, trouble breathing Pain, tingling, or numbness in the hands or feet Painful swelling, warmth, or redness of the skin, blisters or sores at the infusion site Redness, blistering, peeling, or loosening of the skin, including inside the mouth Sudden or severe stomach pain, bloody diarrhea, fever, nausea, vomiting Swelling of the ankles, hands, or feet Tumor lysis syndrome (TLS)--nausea, vomiting, diarrhea, decrease in the amount of urine, dark urine, unusual weakness or fatigue, confusion, muscle pain or cramps, fast or irregular heartbeat, joint pain Unusual bruising or bleeding Side effects that usually do not require medical attention (report to your care team if they continue or are bothersome): Change in nail shape, thickness, or color Change in taste Hair loss Increased tears This list may not describe all possible side effects. Call your doctor for medical advice about side effects. You may report side effects to FDA at 1-800-FDA-1088. Where should I keep  my medication? This medication is given in a hospital or clinic. It will not be stored at home. NOTE: This sheet is a summary. It may not cover all possible information. If you have questions about this medicine, talk to your doctor, pharmacist, or health care provider.  2023 Elsevier/Gold Standard (2021-11-27 00:00:00) Gemcitabine Injection What is this medication? GEMCITABINE (jem SYE ta been) treats some types of cancer. It works by slowing down the growth of cancer cells. This medicine may be used for other purposes; ask your health care provider or pharmacist if you have questions. COMMON BRAND NAME(S): Gemzar, Infugem What should I tell my care team before I take this medication? They need to know if you have any of these conditions: Blood disorders Infection Kidney disease Liver disease Lung or breathing disease, such as asthma or COPD Recent or ongoing radiation therapy An unusual or allergic reaction to gemcitabine, other medications, foods, dyes, or preservatives If you or your partner are pregnant or trying to get pregnant Breast-feeding How should I use this medication? This medication is injected into a vein. It is given by your care team in a hospital or clinic setting. Talk to your care team about the use of  this medication in children. Special care may be needed. Overdosage: If you think you have taken too much of this medicine contact a poison control center or emergency room at once. NOTE: This medicine is only for you. Do not share this medicine with others. What if I miss a dose? Keep appointments for follow-up doses. It is important not to miss your dose. Call your care team if you are unable to keep an appointment. What may interact with this medication? Interactions have not been studied. This list may not describe all possible interactions. Give your health care provider a list of all the medicines, herbs, non-prescription drugs, or dietary supplements you use.  Also tell them if you smoke, drink alcohol, or use illegal drugs. Some items may interact with your medicine. What should I watch for while using this medication? Your condition will be monitored carefully while you are receiving this medication. This medication may make you feel generally unwell. This is not uncommon, as chemotherapy can affect healthy cells as well as cancer cells. Report any side effects. Continue your course of treatment even though you feel ill unless your care team tells you to stop. In some cases, you may be given additional medications to help with side effects. Follow all directions for their use. This medication may increase your risk of getting an infection. Call your care team for advice if you get a fever, chills, sore throat, or other symptoms of a cold or flu. Do not treat yourself. Try to avoid being around people who are sick. This medication may increase your risk to bruise or bleed. Call your care team if you notice any unusual bleeding. Be careful brushing or flossing your teeth or using a toothpick because you may get an infection or bleed more easily. If you have any dental work done, tell your dentist you are receiving this medication. Avoid taking medications that contain aspirin, acetaminophen, ibuprofen, naproxen, or ketoprofen unless instructed by your care team. These medications may hide a fever. Talk to your care team if you or your partner wish to become pregnant or think you might be pregnant. This medication can cause serious birth defects if taken during pregnancy and for 6 months after the last dose. A negative pregnancy test is required before starting this medication. A reliable form of contraception is recommended while taking this medication and for 6 months after the last dose. Talk to your care team about effective forms of contraception. Do not father a child while taking this medication and for 3 months after the last dose. Use a condom while  having sex during this time period. Do not breastfeed while taking this medication and for at least 1 week after the last dose. This medication may cause infertility. Talk to your care team if you are concerned about your fertility. What side effects may I notice from receiving this medication? Side effects that you should report to your care team as soon as possible: Allergic reactions--skin rash, itching, hives, swelling of the face, lips, tongue, or throat Capillary leak syndrome--stomach or muscle pain, unusual weakness or fatigue, feeling faint or lightheaded, decrease in the amount of urine, swelling of the ankles, hands, or feet, trouble breathing Infection--fever, chills, cough, sore throat, wounds that don't heal, pain or trouble when passing urine, general feeling of discomfort or being unwell Liver injury--right upper belly pain, loss of appetite, nausea, light-colored stool, dark yellow or brown urine, yellowing skin or eyes, unusual weakness or fatigue Low red blood cell  level--unusual weakness or fatigue, dizziness, headache, trouble breathing Lung injury--shortness of breath or trouble breathing, cough, spitting up blood, chest pain, fever Stomach pain, bloody diarrhea, pale skin, unusual weakness or fatigue, decrease in the amount of urine, which may be signs of hemolytic uremic syndrome Sudden and severe headache, confusion, change in vision, seizures, which may be signs of posterior reversible encephalopathy syndrome (PRES) Unusual bruising or bleeding Side effects that usually do not require medical attention (report to your care team if they continue or are bothersome): Diarrhea Drowsiness Hair loss Nausea Pain, redness, or swelling with sores inside the mouth or throat Vomiting This list may not describe all possible side effects. Call your doctor for medical advice about side effects. You may report side effects to FDA at 1-800-FDA-1088. Where should I keep my  medication? This medication is given in a hospital or clinic. It will not be stored at home. NOTE: This sheet is a summary. It may not cover all possible information. If you have questions about this medicine, talk to your doctor, pharmacist, or health care provider.  2023 Elsevier/Gold Standard (2022-01-27 00:00:00)  Hypokalemia Hypokalemia means that the amount of potassium in the blood is lower than normal. Potassium is a mineral (electrolyte) that helps regulate the amount of fluid in the body. It also stimulates muscle tightening (contraction) and helps nerves work properly. Normally, most of the body's potassium is inside cells, and only a very small amount is in the blood. Because the amount in the blood is so small, minor changes to potassium levels in the blood can be life-threatening. What are the causes? This condition may be caused by: Antibiotic medicine. Diarrhea or vomiting. Taking too much of a medicine that helps you have a bowel movement (laxative) can cause diarrhea and lead to hypokalemia. Chronic kidney disease (CKD). Medicines that help the body get rid of excess fluid (diuretics). Eating disorders, such as anorexia or bulimia. Low magnesium levels in the body. Sweating a lot. What are the signs or symptoms? Symptoms of this condition include: Weakness. Constipation. Fatigue. Muscle cramps. Mental confusion. Skipped heartbeats or irregular heartbeat (palpitations). Tingling or numbness. How is this diagnosed? This condition is diagnosed with a blood test. How is this treated? This condition may be treated by: Taking potassium supplements. Adjusting the medicines that you take. Eating more foods that contain a lot of potassium. If your potassium level is very low, you may need to get potassium through an IV and be monitored in the hospital. Follow these instructions at home: Eating and drinking  Eat a healthy diet. A healthy diet includes fresh fruits and  vegetables, whole grains, healthy fats, and lean proteins. If told, eat more foods that contain a lot of potassium. These include: Nuts, such as peanuts and pistachios. Seeds, such as sunflower seeds and pumpkin seeds. Peas, lentils, and lima beans. Whole grain and bran cereals and breads. Fresh fruits and vegetables, such as apricots, avocado, bananas, cantaloupe, kiwi, oranges, tomatoes, asparagus, and potatoes. Juices, such as orange, tomato, and prune. Lean meats, including fish. Milk and milk products, such as yogurt. General instructions Take over-the-counter and prescription medicines only as told by your health care provider. This includes vitamins, natural food products, and supplements. Keep all follow-up visits. This is important. Contact a health care provider if: You have weakness that gets worse. You feel your heart pounding or racing. You vomit. You have diarrhea. You have diabetes and you have trouble keeping your blood sugar in your target range. Get  help right away if: You have chest pain. You have shortness of breath. You have vomiting or diarrhea that lasts for more than 2 days. You faint. These symptoms may be an emergency. Get help right away. Call 911. Do not wait to see if the symptoms will go away. Do not drive yourself to the hospital. Summary Hypokalemia means that the amount of potassium in the blood is lower than normal. This condition is diagnosed with a blood test. Hypokalemia may be treated by taking potassium supplements, adjusting the medicines that you take, or eating more foods that are high in potassium. If your potassium level is very low, you may need to get potassium through an IV and be monitored in the hospital. This information is not intended to replace advice given to you by your health care provider. Make sure you discuss any questions you have with your health care provider. Document Revised: 06/11/2021 Document Reviewed:  06/11/2021 Elsevier Patient Education  [place].

## 2022-05-18 ENCOUNTER — Other Ambulatory Visit: Payer: Self-pay

## 2022-05-19 ENCOUNTER — Inpatient Hospital Stay: Payer: BC Managed Care – PPO

## 2022-05-19 ENCOUNTER — Other Ambulatory Visit: Payer: Self-pay

## 2022-05-19 VITALS — BP 135/79 | HR 74 | Temp 99.2°F | Resp 16

## 2022-05-19 DIAGNOSIS — C55 Malignant neoplasm of uterus, part unspecified: Secondary | ICD-10-CM | POA: Diagnosis not present

## 2022-05-19 DIAGNOSIS — C7800 Secondary malignant neoplasm of unspecified lung: Secondary | ICD-10-CM

## 2022-05-19 MED ORDER — PEGFILGRASTIM-CBQV 6 MG/0.6ML ~~LOC~~ SOSY
6.0000 mg | PREFILLED_SYRINGE | Freq: Once | SUBCUTANEOUS | Status: AC
  Administered 2022-05-19: 6 mg via SUBCUTANEOUS
  Filled 2022-05-19: qty 0.6

## 2022-05-28 ENCOUNTER — Other Ambulatory Visit: Payer: Self-pay

## 2022-05-28 ENCOUNTER — Inpatient Hospital Stay: Payer: BC Managed Care – PPO

## 2022-05-28 ENCOUNTER — Ambulatory Visit (HOSPITAL_COMMUNITY)
Admission: RE | Admit: 2022-05-28 | Discharge: 2022-05-28 | Disposition: A | Discharge status: Home / Self Care | Payer: BC Managed Care – PPO | Source: Ambulatory Visit | Attending: Hematology and Oncology | Admitting: Hematology and Oncology

## 2022-05-28 DIAGNOSIS — C55 Malignant neoplasm of uterus, part unspecified: Secondary | ICD-10-CM | POA: Diagnosis present

## 2022-05-28 DIAGNOSIS — C7801 Secondary malignant neoplasm of right lung: Secondary | ICD-10-CM

## 2022-05-28 DIAGNOSIS — D61818 Other pancytopenia: Secondary | ICD-10-CM

## 2022-05-28 DIAGNOSIS — C7800 Secondary malignant neoplasm of unspecified lung: Secondary | ICD-10-CM | POA: Insufficient documentation

## 2022-05-28 LAB — CBC WITH DIFFERENTIAL (CANCER CENTER ONLY)
Abs Immature Granulocytes: 1.54 10*3/uL — ABNORMAL HIGH (ref 0.00–0.07)
Basophils Absolute: 0.1 10*3/uL (ref 0.0–0.1)
Basophils Relative: 1 %
Eosinophils Absolute: 0.1 10*3/uL (ref 0.0–0.5)
Eosinophils Relative: 0 %
HCT: 34.8 % — ABNORMAL LOW (ref 36.0–46.0)
Hemoglobin: 11.5 g/dL — ABNORMAL LOW (ref 12.0–15.0)
Immature Granulocytes: 8 %
Lymphocytes Relative: 4 %
Lymphs Abs: 0.8 10*3/uL (ref 0.7–4.0)
MCH: 32.3 pg (ref 26.0–34.0)
MCHC: 33 g/dL (ref 30.0–36.0)
MCV: 97.8 fL (ref 80.0–100.0)
Monocytes Absolute: 1.3 10*3/uL — ABNORMAL HIGH (ref 0.1–1.0)
Monocytes Relative: 7 %
Neutro Abs: 15.5 10*3/uL — ABNORMAL HIGH (ref 1.7–7.7)
Neutrophils Relative %: 80 %
Platelet Count: 126 10*3/uL — ABNORMAL LOW (ref 150–400)
RBC: 3.56 MIL/uL — ABNORMAL LOW (ref 3.87–5.11)
RDW: 17.2 % — ABNORMAL HIGH (ref 11.5–15.5)
WBC Count: 19.3 10*3/uL — ABNORMAL HIGH (ref 4.0–10.5)
nRBC: 0.1 % (ref 0.0–0.2)

## 2022-05-28 LAB — CMP (CANCER CENTER ONLY)
ALT: 14 U/L (ref 0–44)
AST: 16 U/L (ref 15–41)
Albumin: 3.5 g/dL (ref 3.5–5.0)
Alkaline Phosphatase: 122 U/L (ref 38–126)
Anion gap: 6 (ref 5–15)
BUN: 10 mg/dL (ref 6–20)
CO2: 28 mmol/L (ref 22–32)
Calcium: 8.5 mg/dL — ABNORMAL LOW (ref 8.9–10.3)
Chloride: 104 mmol/L (ref 98–111)
Creatinine: 0.67 mg/dL (ref 0.44–1.00)
GFR, Estimated: >60 mL/min (ref >60)
Glucose, Bld: 111 mg/dL — ABNORMAL HIGH (ref 70–99)
Potassium: 3.5 mmol/L (ref 3.5–5.1)
Sodium: 138 mmol/L (ref 135–145)
Total Bilirubin: 0.5 mg/dL (ref 0.3–1.2)
Total Protein: 6.5 g/dL (ref 6.5–8.1)

## 2022-05-28 LAB — SAMPLE TO BLOOD BANK

## 2022-05-28 MED ORDER — SODIUM CHLORIDE 0.9% FLUSH
10.0000 mL | Freq: Once | INTRAVENOUS | Status: AC
  Administered 2022-05-28: 10 mL

## 2022-05-28 MED ORDER — HEPARIN SOD (PORK) LOCK FLUSH 100 UNIT/ML IV SOLN
500.0000 [IU] | Freq: Once | INTRAVENOUS | Status: AC
  Administered 2022-05-28: 500 [IU] via INTRAVENOUS

## 2022-05-28 MED ORDER — IOHEXOL 300 MG/ML  SOLN
100.0000 mL | Freq: Once | INTRAMUSCULAR | Status: AC | PRN
  Administered 2022-05-28: 100 mL via INTRAVENOUS

## 2022-05-31 ENCOUNTER — Inpatient Hospital Stay: Payer: BC Managed Care – PPO

## 2022-05-31 ENCOUNTER — Other Ambulatory Visit: Payer: Self-pay | Admitting: Hematology and Oncology

## 2022-05-31 ENCOUNTER — Encounter: Payer: Self-pay | Admitting: Hematology and Oncology

## 2022-05-31 ENCOUNTER — Other Ambulatory Visit: Payer: Self-pay

## 2022-05-31 ENCOUNTER — Inpatient Hospital Stay: Payer: BC Managed Care – PPO | Admitting: Hematology and Oncology

## 2022-05-31 VITALS — HR 91 | Temp 98.7°F

## 2022-05-31 VITALS — BP 113/67 | HR 101 | Resp 18 | Ht 68.0 in | Wt 126.4 lb

## 2022-05-31 DIAGNOSIS — C55 Malignant neoplasm of uterus, part unspecified: Secondary | ICD-10-CM

## 2022-05-31 DIAGNOSIS — G620 Drug-induced polyneuropathy: Secondary | ICD-10-CM

## 2022-05-31 DIAGNOSIS — C7951 Secondary malignant neoplasm of bone: Secondary | ICD-10-CM

## 2022-05-31 DIAGNOSIS — C7801 Secondary malignant neoplasm of right lung: Secondary | ICD-10-CM

## 2022-05-31 DIAGNOSIS — N3090 Cystitis, unspecified without hematuria: Secondary | ICD-10-CM

## 2022-05-31 DIAGNOSIS — R5381 Other malaise: Secondary | ICD-10-CM

## 2022-05-31 DIAGNOSIS — C7802 Secondary malignant neoplasm of left lung: Secondary | ICD-10-CM

## 2022-05-31 DIAGNOSIS — C7800 Secondary malignant neoplasm of unspecified lung: Secondary | ICD-10-CM

## 2022-05-31 DIAGNOSIS — T451X5A Adverse effect of antineoplastic and immunosuppressive drugs, initial encounter: Secondary | ICD-10-CM

## 2022-05-31 DIAGNOSIS — M8448XA Pathological fracture, other site, initial encounter for fracture: Secondary | ICD-10-CM

## 2022-05-31 DIAGNOSIS — D61818 Other pancytopenia: Secondary | ICD-10-CM

## 2022-05-31 MED ORDER — SODIUM CHLORIDE 0.9 % IV SOLN
720.0000 mg/m2 | Freq: Once | INTRAVENOUS | Status: AC
  Administered 2022-05-31: 1216 mg via INTRAVENOUS
  Filled 2022-05-31: qty 26.3

## 2022-05-31 MED ORDER — SODIUM CHLORIDE 0.9% FLUSH
10.0000 mL | Freq: Once | INTRAVENOUS | Status: AC
  Administered 2022-05-31: 10 mL via INTRAVENOUS

## 2022-05-31 MED ORDER — PROCHLORPERAZINE MALEATE 10 MG PO TABS: 10.0000 mg | ORAL_TABLET | Freq: Once | ORAL | Status: DC

## 2022-05-31 MED ORDER — LIDOCAINE-PRILOCAINE 2.5-2.5 % EX CREA: TOPICAL_CREAM | CUTANEOUS | 3 refills | Status: DC

## 2022-05-31 MED ORDER — HEPARIN SOD (PORK) LOCK FLUSH 100 UNIT/ML IV SOLN
500.0000 [IU] | Freq: Once | INTRAVENOUS | Status: AC
  Administered 2022-05-31: 500 [IU] via INTRAVENOUS

## 2022-05-31 MED ORDER — SODIUM CHLORIDE 0.9 % IV SOLN: Freq: Once | INTRAVENOUS | Status: AC

## 2022-05-31 NOTE — Assessment & Plan Note (Signed)
We discussed the role of physical therapy and rehab and she is in agreement for referral

## 2022-05-31 NOTE — Assessment & Plan Note (Signed)
She will continue calcium with vitamin D supplement

## 2022-05-31 NOTE — Assessment & Plan Note (Signed)
This is not worse Observe closely

## 2022-05-31 NOTE — Assessment & Plan Note (Signed)
I have reviewed CT imaging with the patient and her husband Overall, she have a positive response to treatment We will continue treatment for another 3 months with plan to repeat CT imaging in November

## 2022-05-31 NOTE — Progress Notes (Signed)
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[facility]) I have reviewed CT imaging with the patient and her husband Overall, she have a positive response to treatment We will continue treatment for another 3 months with plan to repeat CT imaging in November  Pancytopenia, acquired [facility]) She tolerated chemotherapy better with recent changes to her doses She does not need transfusion support recently  Pathologic fracture of lumbar vertebra She has completed radiation treatment to her bone We discussed the role of vertebroplasty She will think about it  Cystitis This is improved dramatically Observe closely  Peripheral neuropathy due to chemotherapy [facility]) This is not worse Observe closely  Metastasis to bone [facility]) She will continue calcium with vitamin D supplement  Physical debility We discussed the role of physical therapy and rehab and she is in agreement for referral  Orders Placed This Encounter  Procedures   Ambulatory referral to Physical Therapy    Referral Priority:   Routine    Referral Type:   Physical Medicine    Referral Reason:   Specialty Services Required    Requested Specialty:   Physical Therapy    Number of Visits Requested:   1    All questions were answered. The patient knows to call the clinic with any problems, questions or concerns. The total time spent in the appointment was 40 minutes encounter with patients including review of chart and various tests results, discussions about plan of care and coordination of care plan   [name], MD 05/31/2022 10:46 AM  INTERVAL HISTORY: Please see below for problem oriented charting. she returns for treatment follow-up and review of CT imaging results She denies worsening peripheral neuropathy No recent cough, chest pain or shortness of breath She denies back pain but she has limited  flexibility of her back She almost have some sort of muscle spasm when she tried to bend over to pick up stuff She denies recent UTI symptoms such as dysuria, frequency or urgency  REVIEW OF SYSTEMS:   Constitutional: Denies fevers, chills or abnormal weight loss Eyes: Denies blurriness of vision Ears, nose, mouth, throat, and face: Denies mucositis or sore throat Respiratory: Denies cough, dyspnea or wheezes Cardiovascular: Denies palpitation, chest discomfort or lower extremity swelling Gastrointestinal:  Denies nausea, heartburn or change in bowel habits Skin: Denies abnormal skin rashes Lymphatics: Denies new lymphadenopathy or easy bruising Behavioral/Psych: Mood is stable, no new changes  All other systems were reviewed with the patient and are negative.  I have reviewed the past medical history, past surgical history, social history and family history with the patient and they are unchanged from previous note.  ALLERGIES:  is allergic to doxycycline.  MEDICATIONS:  Current Outpatient Medications  Medication Sig Dispense Refill   acetaminophen (TYLENOL) 500 MG tablet Take 1,000 mg by mouth every 6 (six) hours as needed for moderate pain or headache.     bisacodyl 5 MG EC tablet Take 1 tablet (5 mg total) by mouth daily as needed for moderate constipation. 30 tablet 3   dexamethasone (DECADRON) 4 MG tablet Take 2 tablets (8 mg total) by mouth daily. Start the day before Taxotere. Then daily after chemo for 2 days. 30 tablet 1   estradiol (ESTRACE) 0.1 MG/GM vaginal cream PLACE FINGER TIP SIZE AMOUNT OF CREAM AND INSERT SLIGHTLY PAST THE VAGINAL ENTRANCE 3 TIMES DAILY 126 g 4   lidocaine (  XYLOCAINE) 2 % solution SMARTSIG:By Mouth     lidocaine-prilocaine (EMLA) cream Apply to affected area once 30 g 3   loratadine (CLARITIN) 10 MG tablet Take 10 mg by mouth daily as needed (for bone aches).     LORazepam (ATIVAN) 0.5 MG tablet Take 1 tablet (0.5 mg total) by mouth 2 (two) times  daily as needed for anxiety. (Patient not taking: Reported on 04/26/2022) 30 tablet 0   magic mouthwash (nystatin, diphenhydrAMINE, alum & mag hydroxide) suspension mixture Swish and spit 5 mLs 4 (four) times daily as needed for mouth pain. 240 mL 0   metoprolol succinate (TOPROL XL) 25 MG 24 hr tablet Take 1 tablet (25 mg total) by mouth at bedtime. 30 tablet 6   ondansetron (ZOFRAN) 8 MG tablet Take 1 tablet (8 mg total) by mouth every 8 (eight) hours as needed. 30 tablet 1   oxyCODONE (OXY IR/ROXICODONE) 5 MG immediate release tablet Take 1 tablet (5 mg total) by mouth every 4 (four) hours as needed for severe pain. (Patient not taking: Reported on 04/26/2022) 30 tablet 0   prochlorperazine (COMPAZINE) 10 MG tablet Take 1 tablet (10 mg total) by mouth every 6 (six) hours as needed (Nausea or vomiting). 90 tablet 1   senna (SENOKOT) 8.6 MG TABS tablet Take 2 tablets by mouth at bedtime.     No current facility-administered medications for this visit.    SUMMARY OF ONCOLOGIC HISTORY: Oncology History  Uterine leiomyosarcoma ([place])  06/11/2021 Imaging   1. 9.5 x 7.6 x 9.0 cm complex, partially necrotic, mass involving the lower uterine segment/ cervix. No obvious direct extension into the parametrium.  2. 9 mm left pelvic sidewall lymph node is partially necrotic and worrisome for metastatic adenopathy.  3. No findings for abdominal omental or peritoneal surface disease or adenopathy.  4. Tiny low-attenuation lesion in the pancreatic head, likely benign cyst but attention on follow-up scans is suggested.  5. 2.9 cm fundal fibroid.    06/19/2021 Pathology Results   FINAL MICROSCOPIC DIAGNOSIS:   A. UTERINE, CERVICAL MASS, BIOPSY:  - Spindle cell malignancy.  - See comment.   COMMENT:  The biopsies consist of endocervical mucosa with stromal edema and one biopsy fragment has a microscopic focus with atypical spindle cells consistent with poorly differentiated malignancy.  The differential   includes a spindle cell malignancy such as sarcomatoid carcinoma and leiomyosarcoma.  Mullerian adenosarcoma is also a consideration but considered less likely   06/23/2021 Imaging   MR pelvis  10 cm uterine mass with central necrosis, which is centered in the cervix and lower uterine segment. Right parametrial involvement is seen as well as suspected invasion of the distal rectum. Differential diagnosis includes cervical carcinoma and uterine leiomyosarcoma.   Mild bilateral iliac lymphadenopathy, highly suspicious for metastatic disease.   2.9 cm subserosal fibroid in the posterior fundus.   Normal appearance of both ovaries.     06/29/2021 PET scan   1. Hypermetabolic necrotic cervical/uterine mass with bilateral external iliac hypermetabolic lymph nodes. No evidence of distant metastatic disease. 2. 1.5 cm low-attenuation left thyroid nodule. Recommend thyroid ultrasound. (Ref: J Am Coll Radiol. 2015 Feb;12(2): 143-50).   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, pleomorphic, and myxoid areas and associated necrosis (~20%) - Tumor based in cervix and also involves lower uterine segment - Cervicovaginal margin involved by focal  invasive leiomyosarcoma (3:00-5:00, A10) as well as tumor in lymphovascular spaces - Leiomyosarcoma involves right and left parametrial tissue and extends to parametrial margins - Extensive lymphovascular space invasion present, including in uterus and parametria - See synoptic report and comment   Other findings: - Leiomyomata with hyalinization, size up to 3.0 cm - Ovaries and fallopian tubes with no parenchymal involvement by leiomyosarcoma identified, although adnexal lymphovascular space invasion is present   B: Lymph nodes, right pelvic, lymphadenectomy - One of four lymph  nodes positive for metastatic leiomyosarcoma (1/4), with extracapsular extension present   C: Lymph nodes, left pelvic, lymphadenectomy - One of four lymph nodes positive for metastatic leiomyosarcoma (1/4), with extracapsular extension present  Immunohistochemical stains are performed on block A11, and demonstrate that the tumor is positive for desmin and CD10, with SMA staining the majority of the spindle cell component but largely negative in the epithelioid / pleomorphic component. OSCAR, pancytokeratin AE1/AE3, HMB45, and PR appear negative in the tumor. ER shows patchy weak staining and myogenin stains rare cells. Block A23 also shows positive desmin and negative OSCAR pancytokeratin. Overall, the findings are most consistent with leiomyosarcoma, with extensive areas that are myxoid, epithelioid, and pleomorphic as well as more typical spindle cell areas within the overall high grade tumor (grade 3 / 3). The tumor is staged as pT2b (involves other pelvic tissues) given the parametrial involvement and pN1 for FIGO stage IIIC.    07/17/2021 Surgery   Date of Surgery: 07/17/21  Preoperative Diagnosis: High Grade Uterine Sarcoma  Postoperative Diagnosis: Same  Procedure(s): Bilateral - RADICAL ABDOMINAL HYSTER, W/BIL TOTAL PELVIC LYMPHADENECTOMY & PARA-AORTIC LYMPH NODE BX W/WO REM TUBE/OVAR VAGINAL HYSTERECTOMY, FOR UTERUS 250 G OR LESS; WITH REPAIR OF ENTEROCELE COLECTOMY, PARTIAL; WITH COLOPROCTOSTOMY (LOW PELVIC ANASTOMOSIS) WITH COLOSTOMY CYSTOURETHROSCOPY, WITH INSERTION OF INDWELLING URETERAL STENT (EG, GIBBONS OR DOUBLE-J TYPE) - Cystourethroscopy - Bilateral ureteral stent placement - Foley catheter placement  Performing Service: Gynecology Oncology Surgeon(s) and Role: Panel 1: * Lafonda Mosses, MD - Primary * Bernadene Bell, MD - Resident - Assisting * Devonne Doughty, MD - Resident - Assisting Panel 2: * Franchot Erichsen, MD - Primary  Drains:  - Left 6Fr  open-ended ureteral access catheter (green) - Right 5Fr open-ended ureteral access catheter (white) - 16Fr foley catheter to drainage  * No implants in log *  Indications: 56 y.o. female with high grade uterine sarcoma. Urology was consulted pre-operatively for placement of bilateral ureteral stents. Risks, benefits, and alternatives of the above procedure were discussed and informed consent was signed.  OperativeFindings:  - Grossly distorted architecture of urinary bladder likely 2/2 pelvic mass with anterolaterally positioned UOs - Successful placement of bilateral open-ended ureteral catheters under direct visualization - Foley catheter placed at case conclusion  Description: The patient was correctly identified in the preop holding area where written informed consent as well potential risk and complication reviewed. She agreed. The patient was brought to the operative suite where a preinduction timeout was performed. Once correct information was verified, general anesthesia was induced. The patient was then gently placed into dorsal lithotomy position with SCDs in place for VTE prophylaxis. They were prepped and draped in the usual sterile fashion and given appropriate preoperative antibiotics. A second timeout was then performed.   We inserted a 15F rigid cystoscope per urethra with copious lubrication and normal saline irrigation running. We performed cystourethroscopy, which revealed the above findings.  We turned our attention to the left ureteral orifice and  canulated it with a sensor wire, using assistance of a 6Fr open-ended catheter. The wire was advanced into the renal pelvis without difficulty under visual guidance. We then advanced the stent over our wire into the renal pelvis under direct visualization and feel without complication. The wire was subsequently removed.   We then turned our attention to the right ureteral orifice and canulated it with a sensor wire, using assistance  of a 5Fr open-ended catheter. The wire was advanced into the renal pelvis without difficulty under visual guidance. We then advanced the stent over our wire into the renal pelvis under direct visualization and feel without complication. The wire was subsequently removed.   A 16Fr straight catheter was placed, with return of urine indicating appropriate position within the bladder. The balloon was inflated with 10cc sterile water. The stents were secured to the Foley using 0-silk ties, being careful not to occlude the stents or Foley.   The patient was awoken from general anesthesia having tolerated the procedure well and taken to the PACU for routine post-operative recovery.  Post-Op Plan:  - Foley and stents per primary team    07/17/2021 Surgery   Date of Surgery: 07/17/2021  Pre-op Diagnosis: Uterine spindle cell malignancy  Post-op Diagnosis: Same  Procedure(s): Panel 1 RADICAL ABDOMINAL HYSTER, with bilateral S&O, vagineconty upper, bilateral pelvic lyphadenectomy, bilateral ureterolysis,: 97416 (CPT) Panel 2 CYSTOURETHROSCOPY, WITH INSERTION OF INDWELLING URETERAL STENT (EG, GIBBONS OR DOUBLE-J TYPE): 38453 (CPT) Note: Revisions to procedures should be made in chart - see Procedures activity.  Performing Service: Gynecology Oncology Surgeon(s) and Role: Panel 1: * Lafonda Mosses, MD - Primary * Bernadene Bell, MD - Resident - Assisting * Devonne Doughty, MD - Resident - Assisting Panel 2: * Franchot Erichsen, MD - Primary  Findings: On bimanual exam, 10cm necrotic mass filling upper vagina, unable to discretely palpate the cervix. On rectovaginal exam, rectal involvement not identified. Intraoperatively, normal upper abdominal survey including normal liver, diaphragm, stomach, omentum and bowel. Small uterus with 10cm mass expanding the cervix. Palpably enlarged bilateral pelvic lymph nodes adherent to the external iliac veins and obturator nerves, removed. No  palpable para-aortic lymphadenopathy. No rectal involvement of uterine mass.   Specimens:  ID Type Source Tests Collected by Time Destination  1 : uterus,cervix,bilateral tubes/ovaries Tissue Uterus SURGICAL PATHOLOGY EXAM Lafonda Mosses, MD 07/17/2021 0932  2 : right pelvic lymph node Tissue Lymph Node SURGICAL PATHOLOGY EXAM Lafonda Mosses, MD 07/17/2021 1125  3 : LEFT PELVIC LN Tissue Lymph Node SURGICAL PATHOLOGY EXAM Lafonda Mosses, MD 07/17/2021 1144    08/06/2021 Initial Diagnosis   Uterine leiomyosarcoma ([place])   08/06/2021 Cancer Staging   Staging form: Corpus Uteri - Leiomyosarcoma and Endometrial Stromal Sarcoma, AJCC 8th Edition - Pathologic stage from 08/06/2021: FIGO Stage IVB (pT3, pN1, cM1) - Signed by [name], MD on 08/11/2021 Stage prefix: Initial diagnosis   08/10/2021 Imaging   CT abdomen and pelvis 1. Interval development of left lobe pulmonary nodules, measuring up to 7 mm and highly for metastatic disease. 2. Interval development of small to upper normal lymph nodes in the pelvis, concerning for metastatic disease. 3. Postoperative seroma left pelvic sidewall. 4. Tiny cluster of tree-in-bud opacity in the peripheral right lower lobe is new and compatible with sequelae of atypical infection.   08/13/2021 Procedure   Procedure: Placement of a right IJ approach single lumen PowerPort.  Tip is positioned at the superior cavoatrial junction and catheter is ready for  immediate use.  Complications: No immediate   08/14/2021 Echocardiogram    1. Left ventricular ejection fraction, by estimation, is 60 to 65%. The left ventricle has normal function. The left ventricle has no regional wall motion abnormalities. Left ventricular diastolic parameters were normal. The average left ventricular global longitudinal strain is -17.4 %. The global longitudinal strain is normal.  2. Right ventricular systolic function is normal. The right ventricular size is normal.  3. The  mitral valve is normal in structure. No evidence of mitral valve regurgitation. No evidence of mitral stenosis.  4. The aortic valve is tricuspid. Aortic valve regurgitation is not visualized. No aortic stenosis is present.  5. The inferior vena cava is normal in size with greater than 50% respiratory variability, suggesting right atrial pressure of 3 mmHg.     08/17/2021 Imaging   Multiple new and enlarging pulmonary nodules scattered throughout the lungs bilaterally, highly concerning for progressive metastatic disease to the lungs   08/18/2021 - 11/10/2021 Chemotherapy   Patient is on Treatment Plan : UTERINE LEIOMYOSARCOMA Doxorubicin q21d x 6 Cycles     11/09/2021 Imaging   IMPRESSION: 1. Multiple small bilateral pulmonary nodules, some of which are slightly increased in size. Other nodules unchanged. 2. Interval decrease in size of left pelvic sidewall lymph nodes. 3. Unchanged size of perirectal lymph nodes or soft tissue nodules. These however demonstrate new internal hypodensity, suggesting treatment response and internal necrosis. 4. Unchanged left iliac lymph node or peritoneal nodule. 5. Findings are consistent with mixed response to treatment. No evidence of new metastatic disease in the chest, abdomen, or pelvis. 6. Wall thickening and mucosal hyperenhancement of the bladder, consistent with nonspecific infectious or inflammatory cystitis. Correlate with urinalysis. 7. Status post hysterectomy and oophorectomy. Interval resolution of a previously noted left pelvic hematoma or seroma. 8. Trace, nonspecific free fluid in the low pelvis.   11/30/2021 Echocardiogram    1. Left ventricular ejection fraction, by estimation, is 40 to 45%. Left ventricular ejection fraction by 3D volume is 41 %. The left ventricle has mildly decreased function. The left ventricle has no regional wall motion abnormalities. Left ventricular  diastolic parameters are consistent with Grade I diastolic dysfunction  (impaired relaxation).  2. Right ventricular systolic function is moderately reduced. The right ventricular size is normal.  3. The mitral valve is grossly normal. No evidence of mitral valve regurgitation.  4. The aortic valve is normal in structure. Aortic valve regurgitation is not visualized. No aortic stenosis is present.     12/07/2021 -  Chemotherapy   Patient is on Treatment Plan : UTERINE UNDIFFERENTIATED / LEIOMYOSARCOMA Gemcitabine D1,8 + Docetaxel D8 (900/100) q21d     02/05/2022 Imaging   Pathologic burst fracture of L3 due to a metastatic lesion, with probable mild degree of right-sided extraosseous tumor extension in the paraspinal soft tissues, and mild involvement of the right pedicle. No epidural/spinal canal involvement.   Multilevel degenerative disc disease without any significant stenosis in the lumbar spine.   05/31/2022 Imaging   1. Signs of pelvic resolution of pelvic sidewall nodal disease/soft tissue near the LEFT vaginal apex. 2. Decreased conspicuity of RIGHT lower lobe pulmonary nodule and stable LEFT apical pulmonary nodule. 3. Unchanged appearance of pathologic fracture at L3. 4. Urinary bladder wall thickening, slightly improved posteriorly, anterior urinary bladder may show some residual diffuse thickening. Continued correlation with signs of cystitis is suggested.     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 Multiple small bilateral pulmonary nodules, some of which are slightly increased in size. Other nodules unchanged. 2. Interval decrease in size of left pelvic sidewall lymph nodes. 3. Unchanged size of perirectal lymph nodes or soft tissue nodules. These however demonstrate new internal hypodensity, suggesting treatment response and  internal necrosis. 4. Unchanged left iliac lymph node or peritoneal nodule. 5. Findings are consistent with mixed response to treatment. No evidence of new metastatic disease in the chest, abdomen, or pelvis. 6. Wall thickening and mucosal hyperenhancement of the bladder, consistent with nonspecific infectious or inflammatory cystitis. Correlate with urinalysis. 7. Status post hysterectomy and oophorectomy. Interval resolution of a previously noted left pelvic hematoma or seroma. 8. Trace, nonspecific free fluid in the low pelvis.   12/07/2021 -  Chemotherapy   Patient is on Treatment Plan : UTERINE UNDIFFERENTIATED / LEIOMYOSARCOMA Gemcitabine D1,8 + Docetaxel D8 (900/100) q21d       PHYSICAL EXAMINATION: ECOG PERFORMANCE STATUS: 1 - Symptomatic but completely ambulatory  Vitals:   05/31/22 1010  BP: 113/67  Pulse: (!) 101  Resp: 18  SpO2: 100%   Filed Weights   05/31/22 1010  Weight: 126 lb 6.4 oz (57.3 kg)    GENERAL:alert, no distress and comfortable NEURO: alert & oriented x 3 with fluent speech, no focal motor/sensory deficits  LABORATORY DATA:  I have reviewed the data as listed    Component Value Date/Time   NA 138 05/28/2022 1507   K 3.5 05/28/2022 1507   CL 104 05/28/2022 1507   CO2 28 05/28/2022 1507   GLUCOSE 111 (H) 05/28/2022 1507   BUN 10 05/28/2022 1507   CREATININE 0.67 05/28/2022 1507   CALCIUM 8.5 (L) 05/28/2022 1507   PROT 6.5 05/28/2022 1507   ALBUMIN 3.5 05/28/2022 1507   AST 16 05/28/2022 1507   ALT 14 05/28/2022 1507   ALKPHOS 122 05/28/2022 1507   BILITOT 0.5 05/28/2022 1507   GFRNONAA >60 05/28/2022 1507    No results found for: "SPEP", "UPEP"  Lab Results  Component Value Date   WBC 19.3 (H) 05/28/2022   NEUTROABS 15.5 (H) 05/28/2022   HGB 11.5 (L) 05/28/2022   HCT 34.8 (L) 05/28/2022   MCV 97.8 05/28/2022   PLT 126 (L) 05/28/2022      Chemistry      Component Value Date/Time   NA 138 05/28/2022 1507   K 3.5 05/28/2022 1507    CL 104 05/28/2022 1507   CO2 28 05/28/2022 1507   BUN 10 05/28/2022 1507   CREATININE 0.67 05/28/2022 1507      Component Value Date/Time   CALCIUM 8.5 (L) 05/28/2022 1507   ALKPHOS 122 05/28/2022 1507   AST 16 05/28/2022 1507   ALT 14 05/28/2022 1507   BILITOT 0.5 05/28/2022 1507       RADIOGRAPHIC STUDIES: I have reviewed multiple CT imaging with the patient and her husband I have personally reviewed the radiological images as listed and agreed with the findings in the report. CT CHEST ABDOMEN PELVIS W CONTRAST  Result Date: 05/31/2022 CLINICAL DATA:  Uterine leiomyosarcoma in a 56 year old female. * Tracking Code: BO * EXAM: CT CHEST, ABDOMEN, AND PELVIS WITH CONTRAST TECHNIQUE: Multidetector CT imaging of the chest, abdomen and pelvis was performed following the standard protocol during bolus administration of intravenous contrast. RADIATION DOSE  REDUCTION: This exam was performed according to the departmental dose-optimization program which includes automated exposure control, adjustment of the mA and/or kV according to patient size and/or use of iterative reconstruction technique. CONTRAST:  125m OMNIPAQUE IOHEXOL 300 MG/ML  SOLN COMPARISON:  February 04, 2022 in January of 2023 FINDINGS: CT CHEST FINDINGS Cardiovascular: RIGHT-sided Port-A-Cath terminates in the RIGHT atrium as before. Heart size normal without pericardial effusion or nodularity. Normal caliber of the thoracic aorta. Normal caliber of central pulmonary vessels. Mediastinum/Nodes: Esophagus is normal. No thoracic inlet lymphadenopathy. No axillary lymphadenopathy. No mediastinal or hilar lymphadenopathy. Lungs/Pleura: No effusion. No consolidative changes. Small pulmonary nodule in the RIGHT lower lobe, superior segment is less distinct than on the previous study measuring 4 mm (image 73/5) previously approximately 5 mm. Stable appearance of LEFT upper lobe pulmonary nodule (image 22/5) 3-4 mm. No new pulmonary nodules.  Musculoskeletal: See below for full musculoskeletal detail. CT ABDOMEN PELVIS FINDINGS Hepatobiliary: No focal, suspicious hepatic lesion. No pericholecystic stranding. No biliary duct dilation. Portal vein is patent. Pancreas: Normal, without mass, inflammation or ductal dilatation. Spleen: Spleen normal size and contour without focal lesion. Adrenals/Urinary Tract: Adrenal glands are unremarkable. Symmetric renal enhancement. No sign of hydronephrosis. No suspicious renal lesion or perinephric stranding. Thickening of the urinary bladder is perhaps slightly improved but with limited distension on today's study. Stomach/Bowel: Stomach without signs of adjacent stranding. No sign of small bowel obstruction or acute small bowel process. Appendix not visible but no secondary signs to suggest acute process adjacent to the cecum. The colon is mildly distended with stool and gas without acute findings. Vascular/Lymphatic: Aorta with smooth contours. IVC with smooth contours. No aneurysmal dilation of the abdominal aorta. There is no gastrohepatic or hepatoduodenal ligament lymphadenopathy. No retroperitoneal or mesenteric lymphadenopathy. No pelvic sidewall lymphadenopathy. Reproductive: Post hysterectomy. LEFT pelvic nodal disease has resolved. Soft tissue adjacent to the RIGHT rectum has resolved. Signs of pelvic lymphadenectomy in addition to hysterectomy with similar appearance otherwise compared to prior imaging. Other: No ascites. Musculoskeletal: Fracture of the RIGHT lateral aspect of the L3 vertebral body associated with mild loss of height and central lucency shows slight increased sclerosis without change otherwise since previous imaging. IMPRESSION: 1. Signs of pelvic resolution of pelvic sidewall nodal disease/soft tissue near the LEFT vaginal apex. 2. Decreased conspicuity of RIGHT lower lobe pulmonary nodule and stable LEFT apical pulmonary nodule. 3. Unchanged appearance of pathologic fracture at L3. 4.  Urinary bladder wall thickening, slightly improved posteriorly, anterior urinary bladder may show some residual diffuse thickening. Continued correlation with signs of cystitis is suggested. Electronically Signed   By: GZetta BillsM.D.   On: 05/31/2022 10:32

## 2022-05-31 NOTE — Progress Notes (Signed)
ON PATHWAY REGIMEN - Uterine  No Change  Continue With Treatment as Ordered.  Original Decision Date/Time: 11/30/2021 13:33     A cycle is every 21 days:     Gemcitabine      Docetaxel      [id number]   **Always confirm dose/schedule in your pharmacy ordering system**  Patient Characteristics: High Grade Undifferentiated/Leiomyosarcoma, Recurrent/Progressive Disease, Medically Inoperable, Second Line, Relapse < 12 Months From Prior Therapy Histology: High Grade Undifferentiated/Leiomyosarcoma Therapeutic Status: Recurrent or Progressive Disease Surgical Status: Medically Inoperable Line of Therapy: Second Line Time to Recurrence: Relapse < 12 Months From Prior Therapy Intent of Therapy: Non-Curative / Palliative Intent, Discussed with Patient

## 2022-05-31 NOTE — Patient Instructions (Signed)
[facility]  Discharge Instructions: Thank you for choosing [facility] to provide your oncology and hematology care.   If you have a lab appointment with the Cancer Center, please go directly to the Cancer Center and check in at the registration area.   Wear comfortable clothing and clothing appropriate for easy access to any Portacath or PICC line.   We strive to give you quality time with your provider. You may need to reschedule your appointment if you arrive late (15 or more minutes).  Arriving late affects you and other patients whose appointments are after yours.  Also, if you miss three or more appointments without notifying the office, you may be dismissed from the clinic at the provider's discretion.      For prescription refill requests, have your pharmacy contact our office and allow 72 hours for refills to be completed.    Today you received the following chemotherapy and/or immunotherapy agents: Gemzar      To help prevent nausea and vomiting after your treatment, we encourage you to take your nausea medication as directed.  BELOW ARE SYMPTOMS THAT SHOULD BE REPORTED IMMEDIATELY: *FEVER GREATER THAN 100.4 F (38 C) OR HIGHER *CHILLS OR SWEATING *NAUSEA AND VOMITING THAT IS NOT CONTROLLED WITH YOUR NAUSEA MEDICATION *UNUSUAL SHORTNESS OF BREATH *UNUSUAL BRUISING OR BLEEDING *URINARY PROBLEMS (pain or burning when urinating, or frequent urination) *BOWEL PROBLEMS (unusual diarrhea, constipation, pain near the anus) TENDERNESS IN MOUTH AND THROAT WITH OR WITHOUT PRESENCE OF ULCERS (sore throat, sores in mouth, or a toothache) UNUSUAL RASH, SWELLING OR PAIN  UNUSUAL VAGINAL DISCHARGE OR ITCHING   Items with * indicate a potential emergency and should be followed up as soon as possible or go to the Emergency Department if any problems should occur.  Please show the CHEMOTHERAPY ALERT CARD or IMMUNOTHERAPY ALERT CARD at check-in to the  Emergency Department and triage nurse.  Should you have questions after your visit or need to cancel or reschedule your appointment, please contact [facility]  Dept: [phone]  and follow the prompts.  Office hours are 8:00 a.m. to 4:30 p.m. Monday - Friday. Please note that voicemails left after 4:00 p.m. may not be returned until the following business day.  We are closed weekends and major holidays. You have access to a nurse at all times for urgent questions. Please call the main number to the clinic Dept: [phone] and follow the prompts.   For any non-urgent questions, you may also contact your provider using MyChart. We now offer e-Visits for anyone 18 and older to request care online for non-urgent symptoms. For details visit [email or web address].   Also download the MyChart app! Go to the app store, search "MyChart", open the app, select [place], and log in with your MyChart username and password.  Masks are optional in the cancer centers. If you would like for your care team to wear a mask while they are taking care of you, please let them know. You may have one support person who is at least 56 years old accompany you for your appointments. 

## 2022-05-31 NOTE — Assessment & Plan Note (Signed)
This is improved dramatically Observe closely

## 2022-05-31 NOTE — Assessment & Plan Note (Signed)
She tolerated chemotherapy better with recent changes to her doses She does not need transfusion support recently

## 2022-05-31 NOTE — Assessment & Plan Note (Signed)
She has completed radiation treatment to her bone We discussed the role of vertebroplasty She will think about it

## 2022-06-11 MED FILL — Dexamethasone Sodium Phosphate Inj 100 MG/10ML: INTRAMUSCULAR | Qty: 1 | Status: AC

## 2022-06-15 ENCOUNTER — Inpatient Hospital Stay: Payer: BC Managed Care – PPO | Admitting: Hematology and Oncology

## 2022-06-15 ENCOUNTER — Other Ambulatory Visit: Payer: Self-pay

## 2022-06-15 ENCOUNTER — Inpatient Hospital Stay: Payer: BC Managed Care – PPO

## 2022-06-15 ENCOUNTER — Inpatient Hospital Stay: Payer: BC Managed Care – PPO | Attending: Gynecologic Oncology

## 2022-06-15 ENCOUNTER — Encounter: Payer: Self-pay | Admitting: Hematology and Oncology

## 2022-06-15 VITALS — BP 123/72 | HR 104 | Temp 97.1°F | Resp 16 | Wt 133.4 lb

## 2022-06-15 VITALS — BP 125/80 | HR 98 | Temp 98.7°F | Resp 18

## 2022-06-15 DIAGNOSIS — C55 Malignant neoplasm of uterus, part unspecified: Secondary | ICD-10-CM | POA: Insufficient documentation

## 2022-06-15 DIAGNOSIS — Z5111 Encounter for antineoplastic chemotherapy: Secondary | ICD-10-CM | POA: Insufficient documentation

## 2022-06-15 DIAGNOSIS — D6481 Anemia due to antineoplastic chemotherapy: Secondary | ICD-10-CM | POA: Diagnosis not present

## 2022-06-15 DIAGNOSIS — C7800 Secondary malignant neoplasm of unspecified lung: Secondary | ICD-10-CM

## 2022-06-15 DIAGNOSIS — Z79899 Other long term (current) drug therapy: Secondary | ICD-10-CM | POA: Diagnosis not present

## 2022-06-15 DIAGNOSIS — M8448XA Pathological fracture, other site, initial encounter for fracture: Secondary | ICD-10-CM | POA: Diagnosis not present

## 2022-06-15 DIAGNOSIS — Z5189 Encounter for other specified aftercare: Secondary | ICD-10-CM | POA: Diagnosis not present

## 2022-06-15 DIAGNOSIS — T451X5A Adverse effect of antineoplastic and immunosuppressive drugs, initial encounter: Secondary | ICD-10-CM

## 2022-06-15 DIAGNOSIS — M8458XA Pathological fracture in neoplastic disease, other specified site, initial encounter for fracture: Secondary | ICD-10-CM | POA: Insufficient documentation

## 2022-06-15 LAB — CBC WITH DIFFERENTIAL (CANCER CENTER ONLY)
Abs Immature Granulocytes: 0.03 10*3/uL (ref 0.00–0.07)
Basophils Absolute: 0 10*3/uL (ref 0.0–0.1)
Basophils Relative: 0 %
Eosinophils Absolute: 0 10*3/uL (ref 0.0–0.5)
Eosinophils Relative: 0 %
HCT: 33.2 % — ABNORMAL LOW (ref 36.0–46.0)
Hemoglobin: 10.9 g/dL — ABNORMAL LOW (ref 12.0–15.0)
Immature Granulocytes: 0 %
Lymphocytes Relative: 5 %
Lymphs Abs: 0.4 10*3/uL — ABNORMAL LOW (ref 0.7–4.0)
MCH: 32.7 pg (ref 26.0–34.0)
MCHC: 32.8 g/dL (ref 30.0–36.0)
MCV: 99.7 fL (ref 80.0–100.0)
Monocytes Absolute: 0.4 10*3/uL (ref 0.1–1.0)
Monocytes Relative: 5 %
Neutro Abs: 7.1 10*3/uL (ref 1.7–7.7)
Neutrophils Relative %: 90 %
Platelet Count: 333 10*3/uL (ref 150–400)
RBC: 3.33 MIL/uL — ABNORMAL LOW (ref 3.87–5.11)
RDW: 16.8 % — ABNORMAL HIGH (ref 11.5–15.5)
WBC Count: 7.9 10*3/uL (ref 4.0–10.5)
nRBC: 0 % (ref 0.0–0.2)

## 2022-06-15 LAB — CMP (CANCER CENTER ONLY)
ALT: 14 U/L (ref 0–44)
AST: 13 U/L — ABNORMAL LOW (ref 15–41)
Albumin: 3.7 g/dL (ref 3.5–5.0)
Alkaline Phosphatase: 69 U/L (ref 38–126)
Anion gap: 6 (ref 5–15)
BUN: 16 mg/dL (ref 6–20)
CO2: 26 mmol/L (ref 22–32)
Calcium: 9 mg/dL (ref 8.9–10.3)
Chloride: 107 mmol/L (ref 98–111)
Creatinine: 0.57 mg/dL (ref 0.44–1.00)
GFR, Estimated: >60 mL/min (ref >60)
Glucose, Bld: 175 mg/dL — ABNORMAL HIGH (ref 70–99)
Potassium: 4 mmol/L (ref 3.5–5.1)
Sodium: 139 mmol/L (ref 135–145)
Total Bilirubin: 0.3 mg/dL (ref 0.3–1.2)
Total Protein: 5.9 g/dL — ABNORMAL LOW (ref 6.5–8.1)

## 2022-06-15 MED ORDER — HEPARIN SOD (PORK) LOCK FLUSH 100 UNIT/ML IV SOLN
500.0000 [IU] | Freq: Once | INTRAVENOUS | Status: AC | PRN
  Administered 2022-06-15: 500 [IU]

## 2022-06-15 MED ORDER — SODIUM CHLORIDE 0.9% FLUSH
10.0000 mL | Freq: Once | INTRAVENOUS | Status: AC
  Administered 2022-06-15: 10 mL

## 2022-06-15 MED ORDER — SODIUM CHLORIDE 0.9 % IV SOLN
720.0000 mg/m2 | Freq: Once | INTRAVENOUS | Status: AC
  Administered 2022-06-15: 1178 mg via INTRAVENOUS
  Filled 2022-06-15: qty 30.98

## 2022-06-15 MED ORDER — SODIUM CHLORIDE 0.9% FLUSH
10.0000 mL | INTRAVENOUS | Status: DC | PRN
  Administered 2022-06-15: 10 mL

## 2022-06-15 MED ORDER — SODIUM CHLORIDE 0.9 % IV SOLN
80.0000 mg/m2 | Freq: Once | INTRAVENOUS | Status: AC
  Administered 2022-06-15: 130 mg via INTRAVENOUS
  Filled 2022-06-15: qty 13

## 2022-06-15 MED ORDER — SODIUM CHLORIDE 0.9 % IV SOLN: Freq: Once | INTRAVENOUS | Status: AC

## 2022-06-15 MED ORDER — SODIUM CHLORIDE 0.9 % IV SOLN
10.0000 mg | Freq: Once | INTRAVENOUS | Status: AC
  Administered 2022-06-15: 10 mg via INTRAVENOUS
  Filled 2022-06-15: qty 10

## 2022-06-15 NOTE — Assessment & Plan Note (Signed)
Her last imaging in August showed positive response to treatment We will continue treatment for another 3 months with plan to repeat CT imaging in November

## 2022-06-15 NOTE — Assessment & Plan Note (Signed)
She is noted to have hypocalcemia I recommend vitamin D and calcium supplement

## 2022-06-15 NOTE — Assessment & Plan Note (Signed)
She is not symptomatic I reviewed the role of kyphoplasty again with her We also discussed the role of IV bisphosphonates I recommend dental evaluation

## 2022-06-15 NOTE — Assessment & Plan Note (Signed)

## 2022-06-15 NOTE — Progress Notes (Signed)
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 The patient denies recent history of bleeding such as epistaxis, hematuria or hematochezia. She is asymptomatic from the anemia. I will observe for now.  She does not require transfusion now. I will continue the chemotherapy at current dose without dosage adjustment.  If the anemia gets progressive worse in the future, I might have to delay her treatment or adjust the chemotherapy dose.   Orders Placed This Encounter  Procedures   CBC with Differential ([place] Only)    Standing Status:   Future    Standing Expiration Date:   07/28/2023   CMP ([place] only)    Standing Status:   Future    Standing Expiration Date:   07/28/2023   CBC with Differential ([place] Only)    Standing Status:   Future    Standing Expiration Date:   08/11/2023   CMP ([place] only)    Standing Status:   Future    Standing Expiration Date:   08/11/2023    All questions were answered. The patient knows to call the clinic with any problems, questions or concerns. The total time spent in the appointment was 30 minutes encounter with patients including review of chart and various tests results, discussions about plan of care and coordination of care plan   [name], MD 06/15/2022 11:46  AM  INTERVAL HISTORY: Please see below for problem oriented charting. she returns for treatment follow-up  We discussed recommendation from last visit She denies worsening peripheral neuropathy She is not consistent with oral calcium intake Her chronic constipation is stable She had questions related to dental checkup with her dentist  REVIEW OF SYSTEMS:   Constitutional: Denies fevers, chills or abnormal weight loss Eyes: Denies blurriness of vision Ears, nose, mouth, throat, and face: Denies mucositis or sore throat Respiratory: Denies cough, dyspnea or wheezes Cardiovascular: Denies palpitation, chest discomfort or lower extremity swelling Gastrointestinal:  Denies nausea, heartburn or change in bowel habits Skin: Denies abnormal skin rashes Lymphatics: Denies new lymphadenopathy or easy bruising Neurological:Denies numbness, tingling or new weaknesses Behavioral/Psych: Mood is stable, no new changes  All other systems were reviewed with the patient and are negative.  I have reviewed the past medical history, past surgical history, social history and family history with the patient and they are unchanged from previous note.  ALLERGIES:  is allergic to doxycycline.  MEDICATIONS:  Current Outpatient Medications  Medication Sig Dispense Refill   calcium carbonate (TUMS - DOSED IN MG ELEMENTAL CALCIUM) 500 MG chewable tablet Chew 1 tablet by mouth 2 (two) times daily.     cholecalciferol (VITAMIN D3) 25 MCG (1000 UNIT) tablet Take 2,000 Units by mouth daily.     acetaminophen (TYLENOL) 500 MG tablet Take 1,000 mg by mouth every 6 (six) hours as needed for moderate pain or headache.  bisacodyl 5 MG EC tablet Take 1 tablet (5 mg total) by mouth daily as needed for moderate constipation. 30 tablet 3   estradiol (ESTRACE) 0.1 MG/GM vaginal cream PLACE FINGER TIP SIZE AMOUNT OF CREAM AND INSERT SLIGHTLY PAST THE VAGINAL ENTRANCE 3 TIMES DAILY 126 g 4   lidocaine (XYLOCAINE) 2 %  solution SMARTSIG:By Mouth     lidocaine-prilocaine (EMLA) cream Apply to affected area once 30 g 3   loratadine (CLARITIN) 10 MG tablet Take 10 mg by mouth daily as needed (for bone aches).     LORazepam (ATIVAN) 0.5 MG tablet Take 1 tablet (0.5 mg total) by mouth 2 (two) times daily as needed for anxiety. (Patient not taking: Reported on 04/26/2022) 30 tablet 0   magic mouthwash (nystatin, diphenhydrAMINE, alum & mag hydroxide) suspension mixture Swish and spit 5 mLs 4 (four) times daily as needed for mouth pain. 240 mL 0   metoprolol succinate (TOPROL XL) 25 MG 24 hr tablet Take 1 tablet (25 mg total) by mouth at bedtime. 30 tablet 6   ondansetron (ZOFRAN) 8 MG tablet Take 1 tablet (8 mg total) by mouth every 8 (eight) hours as needed. 30 tablet 1   prochlorperazine (COMPAZINE) 10 MG tablet Take 1 tablet (10 mg total) by mouth every 6 (six) hours as needed (Nausea or vomiting). 90 tablet 1   senna (SENOKOT) 8.6 MG TABS tablet Take 2 tablets by mouth at bedtime.     No current facility-administered medications for this visit.    SUMMARY OF ONCOLOGIC HISTORY: Oncology History  Uterine leiomyosarcoma ([place])  06/11/2021 Imaging   1. 9.5 x 7.6 x 9.0 cm complex, partially necrotic, mass involving the lower uterine segment/ cervix. No obvious direct extension into the parametrium.  2. 9 mm left pelvic sidewall lymph node is partially necrotic and worrisome for metastatic adenopathy.  3. No findings for abdominal omental or peritoneal surface disease or adenopathy.  4. Tiny low-attenuation lesion in the pancreatic head, likely benign cyst but attention on follow-up scans is suggested.  5. 2.9 cm fundal fibroid.    06/19/2021 Pathology Results   FINAL MICROSCOPIC DIAGNOSIS:   A. UTERINE, CERVICAL MASS, BIOPSY:  - Spindle cell malignancy.  - See comment.   COMMENT:  The biopsies consist of endocervical mucosa with stromal edema and one biopsy fragment has a microscopic focus with atypical spindle  cells consistent with poorly differentiated malignancy.  The differential  includes a spindle cell malignancy such as sarcomatoid carcinoma and leiomyosarcoma.  Mullerian adenosarcoma is also a consideration but considered less likely   06/23/2021 Imaging   MR pelvis  10 cm uterine mass with central necrosis, which is centered in the cervix and lower uterine segment. Right parametrial involvement is seen as well as suspected invasion of the distal rectum. Differential diagnosis includes cervical carcinoma and uterine leiomyosarcoma.   Mild bilateral iliac lymphadenopathy, highly suspicious for metastatic disease.   2.9 cm subserosal fibroid in the posterior fundus.   Normal appearance of both ovaries.     06/29/2021 PET scan   1. Hypermetabolic necrotic cervical/uterine mass with bilateral external iliac hypermetabolic lymph nodes. No evidence of distant metastatic disease. 2. 1.5 cm low-attenuation left thyroid nodule. Recommend thyroid ultrasound. (Ref: J Am Coll Radiol. 2015 Feb;12(2): 143-50).   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, pleomorphic, and myxoid areas and associated necrosis (~20%) -  Tumor based in cervix and also involves lower uterine segment - Cervicovaginal margin involved by focal invasive leiomyosarcoma (3:00-5:00, A10) as well as tumor in lymphovascular spaces - Leiomyosarcoma involves right and left parametrial tissue and extends to parametrial margins - Extensive lymphovascular space invasion present, including in uterus and parametria - See synoptic report and comment   Other findings: - Leiomyomata with hyalinization, size up to 3.0 cm - Ovaries and fallopian tubes with no parenchymal involvement by leiomyosarcoma identified, although adnexal lymphovascular space invasion is  present   B: Lymph nodes, right pelvic, lymphadenectomy - One of four lymph nodes positive for metastatic leiomyosarcoma (1/4), with extracapsular extension present   C: Lymph nodes, left pelvic, lymphadenectomy - One of four lymph nodes positive for metastatic leiomyosarcoma (1/4), with extracapsular extension present  Immunohistochemical stains are performed on block A11, and demonstrate that the tumor is positive for desmin and CD10, with SMA staining the majority of the spindle cell component but largely negative in the epithelioid / pleomorphic component. OSCAR, pancytokeratin AE1/AE3, HMB45, and PR appear negative in the tumor. ER shows patchy weak staining and myogenin stains rare cells. Block A23 also shows positive desmin and negative OSCAR pancytokeratin. Overall, the findings are most consistent with leiomyosarcoma, with extensive areas that are myxoid, epithelioid, and pleomorphic as well as more typical spindle cell areas within the overall high grade tumor (grade 3 / 3). The tumor is staged as pT2b (involves other pelvic tissues) given the parametrial involvement and pN1 for FIGO stage IIIC.    07/17/2021 Surgery   Date of Surgery: 07/17/21  Preoperative Diagnosis: High Grade Uterine Sarcoma  Postoperative Diagnosis: Same  Procedure(s): Bilateral - RADICAL ABDOMINAL HYSTER, W/BIL TOTAL PELVIC LYMPHADENECTOMY & PARA-AORTIC LYMPH NODE BX W/WO REM TUBE/OVAR VAGINAL HYSTERECTOMY, FOR UTERUS 250 G OR LESS; WITH REPAIR OF ENTEROCELE COLECTOMY, PARTIAL; WITH COLOPROCTOSTOMY (LOW PELVIC ANASTOMOSIS) WITH COLOSTOMY CYSTOURETHROSCOPY, WITH INSERTION OF INDWELLING URETERAL STENT (EG, GIBBONS OR DOUBLE-J TYPE) - Cystourethroscopy - Bilateral ureteral stent placement - Foley catheter placement  Performing Service: Gynecology Oncology Surgeon(s) and Role: Panel 1: * Lafonda Mosses, MD - Primary * Bernadene Bell, MD - Resident - Assisting * Devonne Doughty, MD - Resident -  Assisting Panel 2: * Franchot Erichsen, MD - Primary  Drains:  - Left 6Fr open-ended ureteral access catheter (green) - Right 5Fr open-ended ureteral access catheter (white) - 16Fr foley catheter to drainage  * No implants in log *  Indications: 56 y.o. female with high grade uterine sarcoma. Urology was consulted pre-operatively for placement of bilateral ureteral stents. Risks, benefits, and alternatives of the above procedure were discussed and informed consent was signed.  OperativeFindings:  - Grossly distorted architecture of urinary bladder likely 2/2 pelvic mass with anterolaterally positioned UOs - Successful placement of bilateral open-ended ureteral catheters under direct visualization - Foley catheter placed at case conclusion  Description: The patient was correctly identified in the preop holding area where written informed consent as well potential risk and complication reviewed. She agreed. The patient was brought to the operative suite where a preinduction timeout was performed. Once correct information was verified, general anesthesia was induced. The patient was then gently placed into dorsal lithotomy position with SCDs in place for VTE prophylaxis. They were prepped and draped in the usual sterile fashion and given appropriate preoperative antibiotics. A second timeout was then performed.   We inserted a 47F rigid cystoscope per urethra with copious lubrication and normal saline irrigation running. We performed cystourethroscopy,  which revealed the above findings.  We turned our attention to the left ureteral orifice and canulated it with a sensor wire, using assistance of a 6Fr open-ended catheter. The wire was advanced into the renal pelvis without difficulty under visual guidance. We then advanced the stent over our wire into the renal pelvis under direct visualization and feel without complication. The wire was subsequently removed.   We then turned our attention to the  right ureteral orifice and canulated it with a sensor wire, using assistance of a 5Fr open-ended catheter. The wire was advanced into the renal pelvis without difficulty under visual guidance. We then advanced the stent over our wire into the renal pelvis under direct visualization and feel without complication. The wire was subsequently removed.   A 16Fr straight catheter was placed, with return of urine indicating appropriate position within the bladder. The balloon was inflated with 10cc sterile water. The stents were secured to the Foley using 0-silk ties, being careful not to occlude the stents or Foley.   The patient was awoken from general anesthesia having tolerated the procedure well and taken to the PACU for routine post-operative recovery.  Post-Op Plan:  - Foley and stents per primary team    07/17/2021 Surgery   Date of Surgery: 07/17/2021  Pre-op Diagnosis: Uterine spindle cell malignancy  Post-op Diagnosis: Same  Procedure(s): Panel 1 RADICAL ABDOMINAL HYSTER, with bilateral S&O, vagineconty upper, bilateral pelvic lyphadenectomy, bilateral ureterolysis,: 44034 (CPT) Panel 2 CYSTOURETHROSCOPY, WITH INSERTION OF INDWELLING URETERAL STENT (EG, GIBBONS OR DOUBLE-J TYPE): 74259 (CPT) Note: Revisions to procedures should be made in chart - see Procedures activity.  Performing Service: Gynecology Oncology Surgeon(s) and Role: Panel 1: * Lafonda Mosses, MD - Primary * Bernadene Bell, MD - Resident - Assisting * Devonne Doughty, MD - Resident - Assisting Panel 2: * Franchot Erichsen, MD - Primary  Findings: On bimanual exam, 10cm necrotic mass filling upper vagina, unable to discretely palpate the cervix. On rectovaginal exam, rectal involvement not identified. Intraoperatively, normal upper abdominal survey including normal liver, diaphragm, stomach, omentum and bowel. Small uterus with 10cm mass expanding the cervix. Palpably enlarged bilateral pelvic lymph nodes  adherent to the external iliac veins and obturator nerves, removed. No palpable para-aortic lymphadenopathy. No rectal involvement of uterine mass.   Specimens:  ID Type Source Tests Collected by Time Destination  1 : uterus,cervix,bilateral tubes/ovaries Tissue Uterus SURGICAL PATHOLOGY EXAM Lafonda Mosses, MD 07/17/2021 0932  2 : right pelvic lymph node Tissue Lymph Node SURGICAL PATHOLOGY EXAM Lafonda Mosses, MD 07/17/2021 1125  3 : LEFT PELVIC LN Tissue Lymph Node SURGICAL PATHOLOGY EXAM Lafonda Mosses, MD 07/17/2021 1144    08/06/2021 Initial Diagnosis   Uterine leiomyosarcoma ([place])   08/06/2021 Cancer Staging   Staging form: Corpus Uteri - Leiomyosarcoma and Endometrial Stromal Sarcoma, AJCC 8th Edition - Pathologic stage from 08/06/2021: FIGO Stage IVB (pT3, pN1, cM1) - Signed by [name], MD on 08/11/2021 Stage prefix: Initial diagnosis   08/10/2021 Imaging   CT abdomen and pelvis 1. Interval development of left lobe pulmonary nodules, measuring up to 7 mm and highly for metastatic disease. 2. Interval development of small to upper normal lymph nodes in the pelvis, concerning for metastatic disease. 3. Postoperative seroma left pelvic sidewall. 4. Tiny cluster of tree-in-bud opacity in the peripheral right lower lobe is new and compatible with sequelae of atypical infection.   08/13/2021 Procedure   Procedure: Placement of a right IJ approach single  lumen PowerPort.  Tip is positioned at the superior cavoatrial junction and catheter is ready for immediate use.  Complications: No immediate   08/14/2021 Echocardiogram    1. Left ventricular ejection fraction, by estimation, is 60 to 65%. The left ventricle has normal function. The left ventricle has no regional wall motion abnormalities. Left ventricular diastolic parameters were normal. The average left ventricular global longitudinal strain is -17.4 %. The global longitudinal strain is normal.  2. Right ventricular  systolic function is normal. The right ventricular size is normal.  3. The mitral valve is normal in structure. No evidence of mitral valve regurgitation. No evidence of mitral stenosis.  4. The aortic valve is tricuspid. Aortic valve regurgitation is not visualized. No aortic stenosis is present.  5. The inferior vena cava is normal in size with greater than 50% respiratory variability, suggesting right atrial pressure of 3 mmHg.     08/17/2021 Imaging   Multiple new and enlarging pulmonary nodules scattered throughout the lungs bilaterally, highly concerning for progressive metastatic disease to the lungs   08/18/2021 - 11/10/2021 Chemotherapy   Patient is on Treatment Plan : UTERINE LEIOMYOSARCOMA Doxorubicin q21d x 6 Cycles     08/18/2021 -  Chemotherapy   Patient is on Treatment Plan : UTERINE UNDIFFERENTIATED LEIOMYOSARCOMA Gemcitabine D1,8 + Docetaxel D8 (900/100) q21d     11/09/2021 Imaging   IMPRESSION: 1. Multiple small bilateral pulmonary nodules, some of which are slightly increased in size. Other nodules unchanged. 2. Interval decrease in size of left pelvic sidewall lymph nodes. 3. Unchanged size of perirectal lymph nodes or soft tissue nodules. These however demonstrate new internal hypodensity, suggesting treatment response and internal necrosis. 4. Unchanged left iliac lymph node or peritoneal nodule. 5. Findings are consistent with mixed response to treatment. No evidence of new metastatic disease in the chest, abdomen, or pelvis. 6. Wall thickening and mucosal hyperenhancement of the bladder, consistent with nonspecific infectious or inflammatory cystitis. Correlate with urinalysis. 7. Status post hysterectomy and oophorectomy. Interval resolution of a previously noted left pelvic hematoma or seroma. 8. Trace, nonspecific free fluid in the low pelvis.   11/30/2021 Echocardiogram    1. Left ventricular ejection fraction, by estimation, is 40 to 45%. Left ventricular ejection  fraction by 3D volume is 41 %. The left ventricle has mildly decreased function. The left ventricle has no regional wall motion abnormalities. Left ventricular  diastolic parameters are consistent with Grade I diastolic dysfunction (impaired relaxation).  2. Right ventricular systolic function is moderately reduced. The right ventricular size is normal.  3. The mitral valve is grossly normal. No evidence of mitral valve regurgitation.  4. The aortic valve is normal in structure. Aortic valve regurgitation is not visualized. No aortic stenosis is present.     12/07/2021 - 05/31/2022 Chemotherapy   Patient is on Treatment Plan : UTERINE UNDIFFERENTIATED / LEIOMYOSARCOMA Gemcitabine D1,8 + Docetaxel D8 (900/100) q21d     02/05/2022 Imaging   Pathologic burst fracture of L3 due to a metastatic lesion, with probable mild degree of right-sided extraosseous tumor extension in the paraspinal soft tissues, and mild involvement of the right pedicle. No epidural/spinal canal involvement.   Multilevel degenerative disc disease without any significant stenosis in the lumbar spine.   05/31/2022 Imaging   1. Signs of pelvic resolution of pelvic sidewall nodal disease/soft tissue near the LEFT vaginal apex. 2. Decreased conspicuity of RIGHT lower lobe pulmonary nodule and stable LEFT apical pulmonary nodule. 3. Unchanged appearance of pathologic fracture at L3.  4. Urinary bladder wall thickening, slightly improved posteriorly, anterior urinary bladder may show some residual diffuse thickening. Continued correlation with signs of cystitis is suggested.     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 Multiple small bilateral pulmonary nodules, some of which are slightly increased in size. Other nodules unchanged. 2. Interval decrease in size of left pelvic sidewall lymph nodes. 3. Unchanged size of perirectal lymph nodes or soft tissue nodules. These however demonstrate new internal hypodensity, suggesting treatment response and internal necrosis. 4. Unchanged left iliac lymph node or peritoneal nodule. 5. Findings are consistent with mixed response to treatment. No evidence of new metastatic disease in the chest, abdomen, or pelvis. 6. Wall thickening and mucosal hyperenhancement of the bladder, consistent with nonspecific infectious or inflammatory cystitis. Correlate with urinalysis. 7. Status post hysterectomy and oophorectomy. Interval resolution of a previously noted left pelvic hematoma or seroma. 8. Trace, nonspecific free fluid in the low pelvis.   12/07/2021 - 05/31/2022 Chemotherapy   Patient is on Treatment Plan : UTERINE UNDIFFERENTIATED / LEIOMYOSARCOMA Gemcitabine D1,8 + Docetaxel D8 (900/100) q21d       PHYSICAL EXAMINATION: ECOG PERFORMANCE STATUS: 1 - Symptomatic but completely ambulatory  Vitals:   06/15/22 1124  BP: 123/72  Pulse: (!) 104  Resp: 16  Temp: (!) 97.1 F (36.2 C)  SpO2: 100%   Filed Weights   06/15/22 1124  Weight: 133 lb 6.4 oz (60.5 kg)    GENERAL:alert, no distress and comfortable NEURO: alert & oriented x 3 with fluent speech, no focal motor/sensory deficits  LABORATORY DATA:  I have reviewed the data as listed    Component Value Date/Time   NA 138 05/28/2022 1507   K 3.5 05/28/2022 1507   CL 104 05/28/2022 1507   CO2 28 05/28/2022 1507   GLUCOSE 111 (H) 05/28/2022 1507   BUN 10 05/28/2022 1507   CREATININE 0.67 05/28/2022 1507   CALCIUM 8.5 (L) 05/28/2022 1507   PROT 6.5 05/28/2022 1507   ALBUMIN 3.5 05/28/2022 1507   AST 16 05/28/2022 1507   ALT 14 05/28/2022 1507    ALKPHOS 122 05/28/2022 1507   BILITOT 0.5 05/28/2022 1507   GFRNONAA >60 05/28/2022 1507    No results found for: "SPEP", "UPEP"  Lab Results  Component Value Date   WBC 7.9 06/15/2022   NEUTROABS 7.1 06/15/2022   HGB 10.9 (L) 06/15/2022   HCT 33.2 (L) 06/15/2022   MCV 99.7 06/15/2022   PLT 333 06/15/2022      Chemistry      Component Value Date/Time   NA 138 05/28/2022 1507   K 3.5 05/28/2022 1507   CL 104 05/28/2022 1507   CO2 28 05/28/2022 1507   BUN 10 05/28/2022 1507   CREATININE 0.67 05/28/2022 1507      Component Value Date/Time   CALCIUM 8.5 (L) 05/28/2022 1507   ALKPHOS 122 05/28/2022 1507   AST 16 05/28/2022 1507   ALT 14 05/28/2022 1507   BILITOT 0.5 05/28/2022 1507       RADIOGRAPHIC STUDIES: I have personally reviewed the radiological images as listed and agreed with the  findings in the report. CT CHEST ABDOMEN PELVIS W CONTRAST  Result Date: 05/31/2022 CLINICAL DATA:  Uterine leiomyosarcoma in a 56 year old female. * Tracking Code: BO * EXAM: CT CHEST, ABDOMEN, AND PELVIS WITH CONTRAST TECHNIQUE: Multidetector CT imaging of the chest, abdomen and pelvis was performed following the standard protocol during bolus administration of intravenous contrast. RADIATION DOSE REDUCTION: This exam was performed according to the departmental dose-optimization program which includes automated exposure control, adjustment of the mA and/or kV according to patient size and/or use of iterative reconstruction technique. CONTRAST:  165m OMNIPAQUE IOHEXOL 300 MG/ML  SOLN COMPARISON:  February 04, 2022 in January of 2023 FINDINGS: CT CHEST FINDINGS Cardiovascular: RIGHT-sided Port-A-Cath terminates in the RIGHT atrium as before. Heart size normal without pericardial effusion or nodularity. Normal caliber of the thoracic aorta. Normal caliber of central pulmonary vessels. Mediastinum/Nodes: Esophagus is normal. No thoracic inlet lymphadenopathy. No axillary lymphadenopathy. No  mediastinal or hilar lymphadenopathy. Lungs/Pleura: No effusion. No consolidative changes. Small pulmonary nodule in the RIGHT lower lobe, superior segment is less distinct than on the previous study measuring 4 mm (image 73/5) previously approximately 5 mm. Stable appearance of LEFT upper lobe pulmonary nodule (image 22/5) 3-4 mm. No new pulmonary nodules. Musculoskeletal: See below for full musculoskeletal detail. CT ABDOMEN PELVIS FINDINGS Hepatobiliary: No focal, suspicious hepatic lesion. No pericholecystic stranding. No biliary duct dilation. Portal vein is patent. Pancreas: Normal, without mass, inflammation or ductal dilatation. Spleen: Spleen normal size and contour without focal lesion. Adrenals/Urinary Tract: Adrenal glands are unremarkable. Symmetric renal enhancement. No sign of hydronephrosis. No suspicious renal lesion or perinephric stranding. Thickening of the urinary bladder is perhaps slightly improved but with limited distension on today's study. Stomach/Bowel: Stomach without signs of adjacent stranding. No sign of small bowel obstruction or acute small bowel process. Appendix not visible but no secondary signs to suggest acute process adjacent to the cecum. The colon is mildly distended with stool and gas without acute findings. Vascular/Lymphatic: Aorta with smooth contours. IVC with smooth contours. No aneurysmal dilation of the abdominal aorta. There is no gastrohepatic or hepatoduodenal ligament lymphadenopathy. No retroperitoneal or mesenteric lymphadenopathy. No pelvic sidewall lymphadenopathy. Reproductive: Post hysterectomy. LEFT pelvic nodal disease has resolved. Soft tissue adjacent to the RIGHT rectum has resolved. Signs of pelvic lymphadenectomy in addition to hysterectomy with similar appearance otherwise compared to prior imaging. Other: No ascites. Musculoskeletal: Fracture of the RIGHT lateral aspect of the L3 vertebral body associated with mild loss of height and central  lucency shows slight increased sclerosis without change otherwise since previous imaging. IMPRESSION: 1. Signs of pelvic resolution of pelvic sidewall nodal disease/soft tissue near the LEFT vaginal apex. 2. Decreased conspicuity of RIGHT lower lobe pulmonary nodule and stable LEFT apical pulmonary nodule. 3. Unchanged appearance of pathologic fracture at L3. 4. Urinary bladder wall thickening, slightly improved posteriorly, anterior urinary bladder may show some residual diffuse thickening. Continued correlation with signs of cystitis is suggested. Electronically Signed   By: GZetta BillsM.D.   On: 05/31/2022 10:32

## 2022-06-15 NOTE — Patient Instructions (Signed)
[facility]COL[facility]  Discharge Instructions: Thank you for choosing [facility] to provide your oncology and hematology care.   If you have a lab appointment with the Cancer Center, please go directly to the Cancer Center and check in at the registration area.   Wear comfortable clothing and clothing appropriate for easy access to any Portacath or PICC line.   We strive to give you quality time with your provider. You may need to reschedule your appointment if you arrive late (15 or more minutes).  Arriving late affects you and other patients whose appointments are after yours.  Also, if you miss three or more appointments without notifying the office, you may be dismissed from the clinic at the provider's discretion.      For prescription refill requests, have your pharmacy contact our office and allow 72 hours for refills to be completed.    Today you received the following chemotherapy and/or immunotherapy agents: Gemzar      To help prevent nausea and vomiting after your treatment, we encourage you to take your nausea medication as directed.  BELOW ARE SYMPTOMS THAT SHOULD BE REPORTED IMMEDIATELY: *FEVER GREATER THAN 100.4 F (38 C) OR HIGHER *CHILLS OR SWEATING *NAUSEA AND VOMITING THAT IS NOT CONTROLLED WITH YOUR NAUSEA MEDICATION *UNUSUAL SHORTNESS OF BREATH *UNUSUAL BRUISING OR BLEEDING *URINARY PROBLEMS (pain or burning when urinating, or frequent urination) *BOWEL PROBLEMS (unusual diarrhea, constipation, pain near the anus) TENDERNESS IN MOUTH AND THROAT WITH OR WITHOUT PRESENCE OF ULCERS (sore throat, sores in mouth, or a toothache) UNUSUAL RASH, SWELLING OR PAIN  UNUSUAL VAGINAL DISCHARGE OR ITCHING   Items with * indicate a potential emergency and should be followed up as soon as possible or go to the Emergency Department if any problems should occur.  Please show the CHEMOTHERAPY ALERT CARD or IMMUNOTHERAPY ALERT CARD at check-in to the  Emergency Department and triage nurse.  Should you have questions after your visit or need to cancel or reschedule your appointment, please contact [facility]  Dept: [phone]  and follow the prompts.  Office hours are 8:00 a.m. to 4:30 p.m. Monday - Friday. Please note that voicemails left after 4:00 p.m. may not be returned until the following business day.  We are closed weekends and major holidays. You have access to a nurse at all times for urgent questions. Please call the main number to the clinic Dept: [phone] and follow the prompts.   For any non-urgent questions, you may also contact your provider using MyChart. We now offer e-Visits for anyone 18 and older to request care online for non-urgent symptoms. For details visit [email or web address].   Also download the MyChart app! Go to the app store, search "MyChart", open the app, select [facility], and log in with your MyChart username and password.  Masks are optional in the cancer centers. If you would like for your care team to wear a mask while they are taking care of you, please let them know. You may have one support person who is at least 56 years old accompany you for your appointments. 

## 2022-06-16 ENCOUNTER — Other Ambulatory Visit: Payer: Self-pay

## 2022-06-17 ENCOUNTER — Inpatient Hospital Stay: Payer: BC Managed Care – PPO

## 2022-06-17 ENCOUNTER — Other Ambulatory Visit: Payer: Self-pay

## 2022-06-17 VITALS — BP 116/67 | HR 84 | Temp 97.8°F | Resp 18

## 2022-06-17 DIAGNOSIS — C55 Malignant neoplasm of uterus, part unspecified: Secondary | ICD-10-CM

## 2022-06-17 DIAGNOSIS — C7800 Secondary malignant neoplasm of unspecified lung: Secondary | ICD-10-CM

## 2022-06-17 MED ORDER — PEGFILGRASTIM-CBQV 6 MG/0.6ML ~~LOC~~ SOSY
6.0000 mg | PREFILLED_SYRINGE | Freq: Once | SUBCUTANEOUS | Status: AC
  Administered 2022-06-17: 6 mg via SUBCUTANEOUS
  Filled 2022-06-17: qty 0.6

## 2022-06-29 ENCOUNTER — Encounter: Payer: Self-pay | Admitting: Hematology and Oncology

## 2022-06-29 ENCOUNTER — Inpatient Hospital Stay: Payer: BC Managed Care – PPO

## 2022-06-29 ENCOUNTER — Other Ambulatory Visit: Payer: Self-pay

## 2022-06-29 ENCOUNTER — Inpatient Hospital Stay (HOSPITAL_BASED_OUTPATIENT_CLINIC_OR_DEPARTMENT_OTHER): Payer: BC Managed Care – PPO | Admitting: Hematology and Oncology

## 2022-06-29 VITALS — BP 116/86 | HR 80 | Temp 98.5°F | Resp 18 | Ht 68.0 in | Wt 131.4 lb

## 2022-06-29 DIAGNOSIS — D6481 Anemia due to antineoplastic chemotherapy: Secondary | ICD-10-CM | POA: Diagnosis not present

## 2022-06-29 DIAGNOSIS — C7800 Secondary malignant neoplasm of unspecified lung: Secondary | ICD-10-CM

## 2022-06-29 DIAGNOSIS — T451X5A Adverse effect of antineoplastic and immunosuppressive drugs, initial encounter: Secondary | ICD-10-CM

## 2022-06-29 DIAGNOSIS — C55 Malignant neoplasm of uterus, part unspecified: Secondary | ICD-10-CM | POA: Diagnosis not present

## 2022-06-29 DIAGNOSIS — M8448XA Pathological fracture, other site, initial encounter for fracture: Secondary | ICD-10-CM | POA: Diagnosis not present

## 2022-06-29 LAB — CBC WITH DIFFERENTIAL (CANCER CENTER ONLY)
Abs Immature Granulocytes: 0.34 10*3/uL — ABNORMAL HIGH (ref 0.00–0.07)
Basophils Absolute: 0.1 10*3/uL (ref 0.0–0.1)
Basophils Relative: 1 %
Eosinophils Absolute: 0.1 10*3/uL (ref 0.0–0.5)
Eosinophils Relative: 1 %
HCT: 34.4 % — ABNORMAL LOW (ref 36.0–46.0)
Hemoglobin: 11.1 g/dL — ABNORMAL LOW (ref 12.0–15.0)
Immature Granulocytes: 3 %
Lymphocytes Relative: 7 %
Lymphs Abs: 0.9 10*3/uL (ref 0.7–4.0)
MCH: 33.4 pg (ref 26.0–34.0)
MCHC: 32.3 g/dL (ref 30.0–36.0)
MCV: 103.6 fL — ABNORMAL HIGH (ref 80.0–100.0)
Monocytes Absolute: 0.7 10*3/uL (ref 0.1–1.0)
Monocytes Relative: 6 %
Neutro Abs: 10.3 10*3/uL — ABNORMAL HIGH (ref 1.7–7.7)
Neutrophils Relative %: 82 %
Platelet Count: 198 10*3/uL (ref 150–400)
RBC: 3.32 MIL/uL — ABNORMAL LOW (ref 3.87–5.11)
RDW: 15.2 % (ref 11.5–15.5)
WBC Count: 12.4 10*3/uL — ABNORMAL HIGH (ref 4.0–10.5)
nRBC: 0.2 % (ref 0.0–0.2)

## 2022-06-29 LAB — CMP (CANCER CENTER ONLY)
ALT: 11 U/L (ref 0–44)
AST: 14 U/L — ABNORMAL LOW (ref 15–41)
Albumin: 3.4 g/dL — ABNORMAL LOW (ref 3.5–5.0)
Alkaline Phosphatase: 80 U/L (ref 38–126)
Anion gap: 4 — ABNORMAL LOW (ref 5–15)
BUN: 12 mg/dL (ref 6–20)
CO2: 29 mmol/L (ref 22–32)
Calcium: 8.5 mg/dL — ABNORMAL LOW (ref 8.9–10.3)
Chloride: 108 mmol/L (ref 98–111)
Creatinine: 0.62 mg/dL (ref 0.44–1.00)
GFR, Estimated: >60 mL/min (ref >60)
Glucose, Bld: 109 mg/dL — ABNORMAL HIGH (ref 70–99)
Potassium: 4 mmol/L (ref 3.5–5.1)
Sodium: 141 mmol/L (ref 135–145)
Total Bilirubin: 0.3 mg/dL (ref 0.3–1.2)
Total Protein: 5.5 g/dL — ABNORMAL LOW (ref 6.5–8.1)

## 2022-06-29 MED ORDER — SODIUM CHLORIDE 0.9 % IV SOLN
720.0000 mg/m2 | Freq: Once | INTRAVENOUS | Status: AC
  Administered 2022-06-29: 1178 mg via INTRAVENOUS
  Filled 2022-06-29: qty 30.98

## 2022-06-29 MED ORDER — SODIUM CHLORIDE 0.9% FLUSH
10.0000 mL | Freq: Once | INTRAVENOUS | Status: AC
  Administered 2022-06-29: 10 mL

## 2022-06-29 MED ORDER — PROCHLORPERAZINE MALEATE 10 MG PO TABS: 10.0000 mg | ORAL_TABLET | Freq: Once | ORAL | Status: DC

## 2022-06-29 MED ORDER — SODIUM CHLORIDE 0.9 % IV SOLN: Freq: Once | INTRAVENOUS | Status: DC

## 2022-06-29 NOTE — Assessment & Plan Note (Signed)
We have extensive discussions in the past about the role of vertebroplasty and IV bisphosphonates She will continue calcium with vitamin D She will obtain dental clearance with her dentist as soon as possible and will let us know prior to IV bisphosphonates

## 2022-06-29 NOTE — Progress Notes (Signed)
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 The patient denies recent history of bleeding such as epistaxis, hematuria or hematochezia. She is asymptomatic from the anemia. I will observe for now.   Orders Placed This Encounter  Procedures   CBC with Differential ([place] Only)    Standing Status:   Future    Standing Expiration Date:   08/25/2023   CMP ([place] only)    Standing Status:   Future    Standing Expiration Date:   08/25/2023   CBC with Differential ([place] Only)    Standing Status:   Future    Standing Expiration Date:   09/08/2023   CMP ([place] only)    Standing Status:   Future    Standing Expiration Date:   09/08/2023    All questions were answered. The patient knows to call the clinic with any problems, questions or concerns. The total time spent in the appointment was 25 minutes encounter with patients including review of chart and various tests results, discussions about plan of care and coordination of care plan   [name], MD 06/29/2022 1:08 PM  INTERVAL HISTORY: Please see below for problem oriented charting. she returns for treatment follow-up seen prior to chemotherapy She has noted slight neuropathy in her fingers Her stamina is improving Denies recent  nausea  REVIEW OF SYSTEMS:   Constitutional: Denies fevers, chills or abnormal weight loss Eyes: Denies blurriness of vision Ears, nose, mouth, throat, and face: Denies mucositis or sore throat Respiratory: Denies cough, dyspnea or wheezes Cardiovascular: Denies palpitation, chest discomfort or lower extremity swelling Gastrointestinal:  Denies nausea, heartburn or change in bowel habits Skin: Denies abnormal skin rashes Lymphatics: Denies new lymphadenopathy or easy bruising Neurological:Denies numbness, tingling or new weaknesses Behavioral/Psych: Mood is stable, no new changes  All other systems were reviewed with the patient and are negative.  I have reviewed the past medical history, past surgical history, social history and family history with the patient and they are unchanged from previous note.  ALLERGIES:  is allergic to doxycycline.  MEDICATIONS:  Current Outpatient Medications  Medication Sig Dispense Refill   acetaminophen (TYLENOL) 500 MG tablet Take 1,000 mg by mouth every 6 (six) hours as needed for moderate pain or headache.     bisacodyl 5 MG EC tablet Take 1 tablet (5 mg total) by mouth daily as needed for moderate constipation. 30 tablet 3   calcium carbonate (TUMS - DOSED IN MG ELEMENTAL CALCIUM) 500 MG chewable tablet Chew 1 tablet by mouth 2 (two) times daily.     cholecalciferol (VITAMIN D3) 25 MCG (1000 UNIT) tablet Take 2,000 Units by mouth daily.     estradiol (ESTRACE) 0.1 MG/GM vaginal cream PLACE FINGER TIP SIZE AMOUNT OF CREAM AND INSERT SLIGHTLY PAST THE VAGINAL ENTRANCE 3 TIMES DAILY 126 g 4   lidocaine (XYLOCAINE)  2 % solution SMARTSIG:By Mouth     lidocaine-prilocaine (EMLA) cream Apply to affected area once 30 g 3   loratadine (CLARITIN) 10 MG tablet Take 10 mg by mouth daily as needed (for bone aches).     LORazepam (ATIVAN) 0.5 MG tablet Take 1 tablet (0.5 mg total) by mouth 2 (two) times daily as needed for anxiety. (Patient not taking: Reported on  04/26/2022) 30 tablet 0   magic mouthwash (nystatin, diphenhydrAMINE, alum & mag hydroxide) suspension mixture Swish and spit 5 mLs 4 (four) times daily as needed for mouth pain. 240 mL 0   metoprolol succinate (TOPROL XL) 25 MG 24 hr tablet Take 1 tablet (25 mg total) by mouth at bedtime. 30 tablet 6   ondansetron (ZOFRAN) 8 MG tablet Take 1 tablet (8 mg total) by mouth every 8 (eight) hours as needed. 30 tablet 1   prochlorperazine (COMPAZINE) 10 MG tablet Take 1 tablet (10 mg total) by mouth every 6 (six) hours as needed (Nausea or vomiting). 90 tablet 1   senna (SENOKOT) 8.6 MG TABS tablet Take 2 tablets by mouth at bedtime.     No current facility-administered medications for this visit.   Facility-Administered Medications Ordered in Other Visits  Medication Dose Route Frequency Provider Last Rate Last Admin   0.9 %  sodium chloride infusion   Intravenous Once Alvy Bimler, [place] Talcott, MD       gemcitabine (GEMZAR) 1,178 mg in sodium chloride 0.9 % 250 mL chemo infusion  720 mg/m2 (Treatment Plan Recorded) Intravenous Once [name], MD 187 mL/hr at 06/29/22 1302 1,178 mg at 06/29/22 1302   prochlorperazine (COMPAZINE) tablet 10 mg  10 mg Oral Once [name], MD        SUMMARY OF ONCOLOGIC HISTORY: Oncology History  Uterine leiomyosarcoma ([place])  06/11/2021 Imaging   1. 9.5 x 7.6 x 9.0 cm complex, partially necrotic, mass involving the lower uterine segment/ cervix. No obvious direct extension into the parametrium.  2. 9 mm left pelvic sidewall lymph node is partially necrotic and worrisome for metastatic adenopathy.  3. No findings for abdominal omental or peritoneal surface disease or adenopathy.  4. Tiny low-attenuation lesion in the pancreatic head, likely benign cyst but attention on follow-up scans is suggested.  5. 2.9 cm fundal fibroid.    06/19/2021 Pathology Results   FINAL MICROSCOPIC DIAGNOSIS:   A. UTERINE, CERVICAL MASS, BIOPSY:  - Spindle cell malignancy.  - See comment.    COMMENT:  The biopsies consist of endocervical mucosa with stromal edema and one biopsy fragment has a microscopic focus with atypical spindle cells consistent with poorly differentiated malignancy.  The differential  includes a spindle cell malignancy such as sarcomatoid carcinoma and leiomyosarcoma.  Mullerian adenosarcoma is also a consideration but considered less likely   06/23/2021 Imaging   MR pelvis  10 cm uterine mass with central necrosis, which is centered in the cervix and lower uterine segment. Right parametrial involvement is seen as well as suspected invasion of the distal rectum. Differential diagnosis includes cervical carcinoma and uterine leiomyosarcoma.   Mild bilateral iliac lymphadenopathy, highly suspicious for metastatic disease.   2.9 cm subserosal fibroid in the posterior fundus.   Normal appearance of both ovaries.     06/29/2021 PET scan   1. Hypermetabolic necrotic cervical/uterine mass with bilateral external iliac hypermetabolic lymph nodes. No evidence of distant metastatic disease. 2. 1.5 cm low-attenuation left thyroid nodule. Recommend thyroid ultrasound. (Ref: J Am Coll Radiol. 2015 Feb;12(2): 143-50).   07/17/2021  Pathology Results   A: Uterus with cervix and bilateral ovaries and fallopian tubes, radical hysterectomy and bilateral salpingo-oophorectomy - Leiomyosarcoma, high grade (grade 3 / 3) with extensive epithelioid, pleomorphic, and myxoid areas and associated necrosis (~20%) - Tumor based in cervix and also involves lower uterine segment - Cervicovaginal margin involved by focal invasive leiomyosarcoma (3:00-5:00, A10) as well as tumor in lymphovascular spaces - Leiomyosarcoma involves right and left parametrial tissue and extends to parametrial margins - Extensive lymphovascular space invasion present, including in uterus and parametria - See synoptic report and comment   Other findings: - Leiomyomata with hyalinization, size up to 3.0  cm - Ovaries and fallopian tubes with no parenchymal involvement by leiomyosarcoma identified, although adnexal lymphovascular space invasion is present   B: Lymph nodes, right pelvic, lymphadenectomy - One of four lymph nodes positive for metastatic leiomyosarcoma (1/4), with extracapsular extension present   C: Lymph nodes, left pelvic, lymphadenectomy - One of four lymph nodes positive for metastatic leiomyosarcoma (1/4), with extracapsular extension present  Immunohistochemical stains are performed on block A11, and demonstrate that the tumor is positive for desmin and CD10, with SMA staining the majority of the spindle cell component but largely negative in the epithelioid / pleomorphic component. OSCAR, pancytokeratin AE1/AE3, HMB45, and PR appear negative in the tumor. ER shows patchy weak staining and myogenin stains rare cells. Block A23 also shows positive desmin and negative OSCAR pancytokeratin. Overall, the findings are most consistent with leiomyosarcoma, with extensive areas that are myxoid, epithelioid, and pleomorphic as well as more typical spindle cell areas within the overall high grade tumor (grade 3 / 3). The tumor is staged as pT2b (involves other pelvic tissues) given the parametrial involvement and pN1 for FIGO stage IIIC.    07/17/2021 Surgery   Date of Surgery: 07/17/21  Preoperative Diagnosis: High Grade Uterine Sarcoma  Postoperative Diagnosis: Same  Procedure(s): Bilateral - RADICAL ABDOMINAL HYSTER, W/BIL TOTAL PELVIC LYMPHADENECTOMY & PARA-AORTIC LYMPH NODE BX W/WO REM TUBE/OVAR VAGINAL HYSTERECTOMY, FOR UTERUS 250 G OR LESS; WITH REPAIR OF ENTEROCELE COLECTOMY, PARTIAL; WITH COLOPROCTOSTOMY (LOW PELVIC ANASTOMOSIS) WITH COLOSTOMY CYSTOURETHROSCOPY, WITH INSERTION OF INDWELLING URETERAL STENT (EG, GIBBONS OR DOUBLE-J TYPE) - Cystourethroscopy - Bilateral ureteral stent placement - Foley catheter placement  Performing Service: Gynecology Oncology Surgeon(s)  and Role: Panel 1: * Lafonda Mosses, MD - Primary * Bernadene Bell, MD - Resident - Assisting * Devonne Doughty, MD - Resident - Assisting Panel 2: * Franchot Erichsen, MD - Primary  Drains:  - Left 6Fr open-ended ureteral access catheter (green) - Right 5Fr open-ended ureteral access catheter (white) - 16Fr foley catheter to drainage  * No implants in log *  Indications: 56 y.o. female with high grade uterine sarcoma. Urology was consulted pre-operatively for placement of bilateral ureteral stents. Risks, benefits, and alternatives of the above procedure were discussed and informed consent was signed.  OperativeFindings:  - Grossly distorted architecture of urinary bladder likely 2/2 pelvic mass with anterolaterally positioned UOs - Successful placement of bilateral open-ended ureteral catheters under direct visualization - Foley catheter placed at case conclusion  Description: The patient was correctly identified in the preop holding area where written informed consent as well potential risk and complication reviewed. She agreed. The patient was brought to the operative suite where a preinduction timeout was performed. Once correct information was verified, general anesthesia was induced. The patient was then gently placed into dorsal lithotomy position with SCDs in place for VTE prophylaxis. They were prepped  and draped in the usual sterile fashion and given appropriate preoperative antibiotics. A second timeout was then performed.   We inserted a 44F rigid cystoscope per urethra with copious lubrication and normal saline irrigation running. We performed cystourethroscopy, which revealed the above findings.  We turned our attention to the left ureteral orifice and canulated it with a sensor wire, using assistance of a 6Fr open-ended catheter. The wire was advanced into the renal pelvis without difficulty under visual guidance. We then advanced the stent over our wire into the  renal pelvis under direct visualization and feel without complication. The wire was subsequently removed.   We then turned our attention to the right ureteral orifice and canulated it with a sensor wire, using assistance of a 5Fr open-ended catheter. The wire was advanced into the renal pelvis without difficulty under visual guidance. We then advanced the stent over our wire into the renal pelvis under direct visualization and feel without complication. The wire was subsequently removed.   A 16Fr straight catheter was placed, with return of urine indicating appropriate position within the bladder. The balloon was inflated with 10cc sterile water. The stents were secured to the Foley using 0-silk ties, being careful not to occlude the stents or Foley.   The patient was awoken from general anesthesia having tolerated the procedure well and taken to the PACU for routine post-operative recovery.  Post-Op Plan:  - Foley and stents per primary team    07/17/2021 Surgery   Date of Surgery: 07/17/2021  Pre-op Diagnosis: Uterine spindle cell malignancy  Post-op Diagnosis: Same  Procedure(s): Panel 1 RADICAL ABDOMINAL HYSTER, with bilateral S&O, vagineconty upper, bilateral pelvic lyphadenectomy, bilateral ureterolysis,: 07622 (CPT) Panel 2 CYSTOURETHROSCOPY, WITH INSERTION OF INDWELLING URETERAL STENT (EG, GIBBONS OR DOUBLE-J TYPE): 63335 (CPT) Note: Revisions to procedures should be made in chart - see Procedures activity.  Performing Service: Gynecology Oncology Surgeon(s) and Role: Panel 1: * Lafonda Mosses, MD - Primary * Bernadene Bell, MD - Resident - Assisting * Devonne Doughty, MD - Resident - Assisting Panel 2: * Franchot Erichsen, MD - Primary  Findings: On bimanual exam, 10cm necrotic mass filling upper vagina, unable to discretely palpate the cervix. On rectovaginal exam, rectal involvement not identified. Intraoperatively, normal upper abdominal survey including  normal liver, diaphragm, stomach, omentum and bowel. Small uterus with 10cm mass expanding the cervix. Palpably enlarged bilateral pelvic lymph nodes adherent to the external iliac veins and obturator nerves, removed. No palpable para-aortic lymphadenopathy. No rectal involvement of uterine mass.   Specimens:  ID Type Source Tests Collected by Time Destination  1 : uterus,cervix,bilateral tubes/ovaries Tissue Uterus SURGICAL PATHOLOGY EXAM Lafonda Mosses, MD 07/17/2021 0932  2 : right pelvic lymph node Tissue Lymph Node SURGICAL PATHOLOGY EXAM Lafonda Mosses, MD 07/17/2021 1125  3 : LEFT PELVIC LN Tissue Lymph Node SURGICAL PATHOLOGY EXAM Lafonda Mosses, MD 07/17/2021 1144    08/06/2021 Initial Diagnosis   Uterine leiomyosarcoma ([place])   08/06/2021 Cancer Staging   Staging form: Corpus Uteri - Leiomyosarcoma and Endometrial Stromal Sarcoma, AJCC 8th Edition - Pathologic stage from 08/06/2021: FIGO Stage IVB (pT3, pN1, cM1) - Signed by [name], MD on 08/11/2021 Stage prefix: Initial diagnosis   08/10/2021 Imaging   CT abdomen and pelvis 1. Interval development of left lobe pulmonary nodules, measuring up to 7 mm and highly for metastatic disease. 2. Interval development of small to upper normal lymph nodes in the pelvis, concerning for metastatic disease. 3. Postoperative  seroma left pelvic sidewall. 4. Tiny cluster of tree-in-bud opacity in the peripheral right lower lobe is new and compatible with sequelae of atypical infection.   08/13/2021 Procedure   Procedure: Placement of a right IJ approach single lumen PowerPort.  Tip is positioned at the superior cavoatrial junction and catheter is ready for immediate use.  Complications: No immediate   08/14/2021 Echocardiogram    1. Left ventricular ejection fraction, by estimation, is 60 to 65%. The left ventricle has normal function. The left ventricle has no regional wall motion abnormalities. Left ventricular diastolic parameters  were normal. The average left ventricular global longitudinal strain is -17.4 %. The global longitudinal strain is normal.  2. Right ventricular systolic function is normal. The right ventricular size is normal.  3. The mitral valve is normal in structure. No evidence of mitral valve regurgitation. No evidence of mitral stenosis.  4. The aortic valve is tricuspid. Aortic valve regurgitation is not visualized. No aortic stenosis is present.  5. The inferior vena cava is normal in size with greater than 50% respiratory variability, suggesting right atrial pressure of 3 mmHg.     08/17/2021 Imaging   Multiple new and enlarging pulmonary nodules scattered throughout the lungs bilaterally, highly concerning for progressive metastatic disease to the lungs   08/18/2021 - 11/10/2021 Chemotherapy   Patient is on Treatment Plan : UTERINE LEIOMYOSARCOMA Doxorubicin q21d x 6 Cycles     08/18/2021 -  Chemotherapy   Patient is on Treatment Plan : UTERINE UNDIFFERENTIATED LEIOMYOSARCOMA Gemcitabine D1,8 + Docetaxel D8 (900/100) q21d     11/09/2021 Imaging   IMPRESSION: 1. Multiple small bilateral pulmonary nodules, some of which are slightly increased in size. Other nodules unchanged. 2. Interval decrease in size of left pelvic sidewall lymph nodes. 3. Unchanged size of perirectal lymph nodes or soft tissue nodules. These however demonstrate new internal hypodensity, suggesting treatment response and internal necrosis. 4. Unchanged left iliac lymph node or peritoneal nodule. 5. Findings are consistent with mixed response to treatment. No evidence of new metastatic disease in the chest, abdomen, or pelvis. 6. Wall thickening and mucosal hyperenhancement of the bladder, consistent with nonspecific infectious or inflammatory cystitis. Correlate with urinalysis. 7. Status post hysterectomy and oophorectomy. Interval resolution of a previously noted left pelvic hematoma or seroma. 8. Trace, nonspecific free fluid in  the low pelvis.   11/30/2021 Echocardiogram    1. Left ventricular ejection fraction, by estimation, is 40 to 45%. Left ventricular ejection fraction by 3D volume is 41 %. The left ventricle has mildly decreased function. The left ventricle has no regional wall motion abnormalities. Left ventricular  diastolic parameters are consistent with Grade I diastolic dysfunction (impaired relaxation).  2. Right ventricular systolic function is moderately reduced. The right ventricular size is normal.  3. The mitral valve is grossly normal. No evidence of mitral valve regurgitation.  4. The aortic valve is normal in structure. Aortic valve regurgitation is not visualized. No aortic stenosis is present.     12/07/2021 - 05/31/2022 Chemotherapy   Patient is on Treatment Plan : UTERINE UNDIFFERENTIATED / LEIOMYOSARCOMA Gemcitabine D1,8 + Docetaxel D8 (900/100) q21d     02/05/2022 Imaging   Pathologic burst fracture of L3 due to a metastatic lesion, with probable mild degree of right-sided extraosseous tumor extension in the paraspinal soft tissues, and mild involvement of the right pedicle. No epidural/spinal canal involvement.   Multilevel degenerative disc disease without any significant stenosis in the lumbar spine.   05/31/2022 Imaging  1. Signs of pelvic resolution of pelvic sidewall nodal disease/soft tissue near the LEFT vaginal apex. 2. Decreased conspicuity of RIGHT lower lobe pulmonary nodule and stable LEFT apical pulmonary nodule. 3. Unchanged appearance of pathologic fracture at L3. 4. Urinary bladder wall thickening, slightly improved posteriorly, anterior urinary bladder may show some residual diffuse thickening. Continued correlation with signs of cystitis is suggested.     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 Multiple small bilateral pulmonary nodules, some of which are slightly increased in size. Other nodules unchanged. 2. Interval decrease in size of left pelvic sidewall lymph nodes. 3. Unchanged size of perirectal lymph nodes or soft tissue nodules. These however demonstrate new internal hypodensity, suggesting treatment response and internal necrosis. 4. Unchanged left iliac lymph node or peritoneal nodule. 5. Findings are consistent with mixed response to treatment. No evidence of new metastatic disease in the chest, abdomen, or pelvis. 6. Wall thickening and mucosal hyperenhancement of the bladder, consistent with nonspecific infectious or inflammatory cystitis. Correlate with urinalysis. 7. Status post hysterectomy and oophorectomy. Interval resolution of a previously noted left pelvic hematoma or seroma. 8. Trace, nonspecific free fluid in the low pelvis.   12/07/2021 - 05/31/2022 Chemotherapy   Patient is on Treatment Plan : UTERINE UNDIFFERENTIATED / LEIOMYOSARCOMA Gemcitabine D1,8 + Docetaxel D8 (900/100) q21d       PHYSICAL EXAMINATION: ECOG PERFORMANCE STATUS: 1 - Symptomatic but completely ambulatory  Vitals:   06/29/22 1145  BP: 116/86  Pulse: 80  Resp: 18  Temp: 98.5 F (36.9 C)  SpO2: 100%   Filed Weights   06/29/22 1145  Weight: 131 lb 6.4 oz (59.6 kg)    GENERAL:alert, no distress and comfortable NEURO: alert & oriented x 3 with fluent speech, no focal motor/sensory deficits  LABORATORY DATA:  I have reviewed the data as listed    Component Value Date/Time   NA 141 06/29/2022 1118   K 4.0 06/29/2022 1118   CL 108 06/29/2022 1118   CO2 29 06/29/2022 1118   GLUCOSE 109 (H) 06/29/2022 1118   BUN 12 06/29/2022 1118   CREATININE 0.62 06/29/2022 1118   CALCIUM 8.5  (L) 06/29/2022 1118   PROT 5.5 (L) 06/29/2022 1118   ALBUMIN 3.4 (L) 06/29/2022 1118   AST 14 (L) 06/29/2022 1118   ALT 11 06/29/2022 1118   ALKPHOS 80 06/29/2022 1118   BILITOT 0.3 06/29/2022 1118   GFRNONAA >60 06/29/2022 1118    No results found for: "SPEP", "UPEP"  Lab Results  Component Value Date   WBC 12.4 (H) 06/29/2022   NEUTROABS 10.3 (H) 06/29/2022   HGB 11.1 (L) 06/29/2022   HCT 34.4 (L) 06/29/2022   MCV 103.6 (H) 06/29/2022   PLT 198 06/29/2022      Chemistry      Component Value Date/Time   NA 141 06/29/2022 1118   K 4.0 06/29/2022 1118   CL 108 06/29/2022 1118   CO2 29 06/29/2022 1118   BUN 12 06/29/2022 1118   CREATININE 0.62 06/29/2022 1118      Component Value Date/Time   CALCIUM 8.5 (L) 06/29/2022 1118  ALKPHOS 80 06/29/2022 1118   AST 14 (L) 06/29/2022 1118   ALT 11 06/29/2022 1118   BILITOT 0.3 06/29/2022 1118

## 2022-06-29 NOTE — Assessment & Plan Note (Signed)
Her last imaging in August showed positive response to treatment We will continue treatment for another 3 months with plan to repeat CT imaging in November

## 2022-06-29 NOTE — Assessment & Plan Note (Signed)
This is likely due to recent treatment. The patient denies recent history of bleeding such as epistaxis, hematuria or hematochezia. She is asymptomatic from the anemia. I will observe for now.   

## 2022-06-30 ENCOUNTER — Other Ambulatory Visit: Payer: Self-pay

## 2022-07-12 MED FILL — Dexamethasone Sodium Phosphate Inj 100 MG/10ML: INTRAMUSCULAR | Qty: 1 | Status: AC

## 2022-07-13 ENCOUNTER — Other Ambulatory Visit: Payer: Self-pay | Admitting: Hematology and Oncology

## 2022-07-13 ENCOUNTER — Inpatient Hospital Stay: Payer: BC Managed Care – PPO | Attending: Gynecologic Oncology

## 2022-07-13 ENCOUNTER — Encounter: Payer: Self-pay | Admitting: Hematology and Oncology

## 2022-07-13 ENCOUNTER — Inpatient Hospital Stay: Payer: BC Managed Care – PPO | Admitting: Hematology and Oncology

## 2022-07-13 ENCOUNTER — Inpatient Hospital Stay: Payer: BC Managed Care – PPO

## 2022-07-13 ENCOUNTER — Other Ambulatory Visit: Payer: Self-pay

## 2022-07-13 VITALS — BP 121/74 | HR 85 | Temp 98.0°F | Resp 18 | Ht 68.0 in | Wt 137.4 lb

## 2022-07-13 DIAGNOSIS — R5383 Other fatigue: Secondary | ICD-10-CM | POA: Diagnosis not present

## 2022-07-13 DIAGNOSIS — G620 Drug-induced polyneuropathy: Secondary | ICD-10-CM | POA: Insufficient documentation

## 2022-07-13 DIAGNOSIS — C55 Malignant neoplasm of uterus, part unspecified: Secondary | ICD-10-CM

## 2022-07-13 DIAGNOSIS — T451X5A Adverse effect of antineoplastic and immunosuppressive drugs, initial encounter: Secondary | ICD-10-CM | POA: Insufficient documentation

## 2022-07-13 DIAGNOSIS — D6481 Anemia due to antineoplastic chemotherapy: Secondary | ICD-10-CM | POA: Insufficient documentation

## 2022-07-13 DIAGNOSIS — D61818 Other pancytopenia: Secondary | ICD-10-CM | POA: Insufficient documentation

## 2022-07-13 DIAGNOSIS — K1231 Oral mucositis (ulcerative) due to antineoplastic therapy: Secondary | ICD-10-CM | POA: Diagnosis not present

## 2022-07-13 DIAGNOSIS — C7800 Secondary malignant neoplasm of unspecified lung: Secondary | ICD-10-CM

## 2022-07-13 DIAGNOSIS — Z5111 Encounter for antineoplastic chemotherapy: Secondary | ICD-10-CM | POA: Insufficient documentation

## 2022-07-13 DIAGNOSIS — Z5189 Encounter for other specified aftercare: Secondary | ICD-10-CM | POA: Insufficient documentation

## 2022-07-13 DIAGNOSIS — M8448XA Pathological fracture, other site, initial encounter for fracture: Secondary | ICD-10-CM | POA: Diagnosis not present

## 2022-07-13 LAB — CBC WITH DIFFERENTIAL (CANCER CENTER ONLY)
Abs Immature Granulocytes: 0.02 10*3/uL (ref 0.00–0.07)
Basophils Absolute: 0 10*3/uL (ref 0.0–0.1)
Basophils Relative: 0 %
Eosinophils Absolute: 0 10*3/uL (ref 0.0–0.5)
Eosinophils Relative: 0 %
HCT: 33.5 % — ABNORMAL LOW (ref 36.0–46.0)
Hemoglobin: 11.1 g/dL — ABNORMAL LOW (ref 12.0–15.0)
Immature Granulocytes: 0 %
Lymphocytes Relative: 5 %
Lymphs Abs: 0.2 10*3/uL — ABNORMAL LOW (ref 0.7–4.0)
MCH: 33.9 pg (ref 26.0–34.0)
MCHC: 33.1 g/dL (ref 30.0–36.0)
MCV: 102.4 fL — ABNORMAL HIGH (ref 80.0–100.0)
Monocytes Absolute: 0 10*3/uL — ABNORMAL LOW (ref 0.1–1.0)
Monocytes Relative: 1 %
Neutro Abs: 4.9 10*3/uL (ref 1.7–7.7)
Neutrophils Relative %: 94 %
Platelet Count: 225 10*3/uL (ref 150–400)
RBC: 3.27 MIL/uL — ABNORMAL LOW (ref 3.87–5.11)
RDW: 15 % (ref 11.5–15.5)
WBC Count: 5.2 10*3/uL (ref 4.0–10.5)
nRBC: 0 % (ref 0.0–0.2)

## 2022-07-13 LAB — CMP (CANCER CENTER ONLY)
ALT: 12 U/L (ref 0–44)
AST: 12 U/L — ABNORMAL LOW (ref 15–41)
Albumin: 3.5 g/dL (ref 3.5–5.0)
Alkaline Phosphatase: 66 U/L (ref 38–126)
Anion gap: 5 (ref 5–15)
BUN: 14 mg/dL (ref 6–20)
CO2: 26 mmol/L (ref 22–32)
Calcium: 8.5 mg/dL — ABNORMAL LOW (ref 8.9–10.3)
Chloride: 108 mmol/L (ref 98–111)
Creatinine: 0.59 mg/dL (ref 0.44–1.00)
GFR, Estimated: >60 mL/min (ref >60)
Glucose, Bld: 235 mg/dL — ABNORMAL HIGH (ref 70–99)
Potassium: 4 mmol/L (ref 3.5–5.1)
Sodium: 139 mmol/L (ref 135–145)
Total Bilirubin: 0.3 mg/dL (ref 0.3–1.2)
Total Protein: 5.4 g/dL — ABNORMAL LOW (ref 6.5–8.1)

## 2022-07-13 MED ORDER — ONDANSETRON HCL 4 MG/2ML IJ SOLN
8.0000 mg | Freq: Once | INTRAMUSCULAR | Status: AC
  Administered 2022-07-13: 8 mg via INTRAVENOUS
  Filled 2022-07-13: qty 4

## 2022-07-13 MED ORDER — SODIUM CHLORIDE 0.9 % IV SOLN: Freq: Once | INTRAVENOUS | Status: AC

## 2022-07-13 MED ORDER — SODIUM CHLORIDE 0.9 % IV SOLN
10.0000 mg | Freq: Once | INTRAVENOUS | Status: AC
  Administered 2022-07-13: 10 mg via INTRAVENOUS
  Filled 2022-07-13: qty 10

## 2022-07-13 MED ORDER — SODIUM CHLORIDE 0.9 % IV SOLN
720.0000 mg/m2 | Freq: Once | INTRAVENOUS | Status: AC
  Administered 2022-07-13: 1178 mg via INTRAVENOUS
  Filled 2022-07-13: qty 30.98

## 2022-07-13 MED ORDER — SODIUM CHLORIDE 0.9 % IV SOLN: 8.0000 mg | Freq: Once | INTRAVENOUS | Status: DC

## 2022-07-13 MED ORDER — SODIUM CHLORIDE 0.9% FLUSH
10.0000 mL | Freq: Once | INTRAVENOUS | Status: AC
  Administered 2022-07-13: 10 mL

## 2022-07-13 MED ORDER — SODIUM CHLORIDE 0.9 % IV SOLN
80.0000 mg/m2 | Freq: Once | INTRAVENOUS | Status: AC
  Administered 2022-07-13: 130 mg via INTRAVENOUS
  Filled 2022-07-13: qty 13

## 2022-07-13 NOTE — Assessment & Plan Note (Signed)
We have extensive discussions in the past about the role of vertebroplasty and IV bisphosphonates She will continue calcium with vitamin D She will obtain dental clearance with her dentist as soon as possible and will let us know prior to IV bisphosphonates.  Her appointment is next week She is also interested to consider vertebroplasty I will consult interventional radiologist for evaluation and management

## 2022-07-13 NOTE — Progress Notes (Signed)
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) Her last imaging in August showed positive response to treatment We will continue treatment for another 3 months with plan to repeat CT imaging in November  Anemia due to antineoplastic chemotherapy This is likely due to recent treatment. The patient denies recent history of bleeding such as epistaxis, hematuria or hematochezia. She is asymptomatic from the anemia. I will observe for now.  Her anemia is improving as we space out the interval between treatments  Pathologic fracture of lumbar vertebra We have extensive discussions in the past about the role of vertebroplasty and IV bisphosphonates She will continue calcium with vitamin D She will obtain dental clearance with her dentist as soon as possible and will let us know prior to IV bisphosphonates.  Her appointment is next week She is also interested to consider vertebroplasty I will consult interventional radiologist for evaluation and management  Peripheral neuropathy due to chemotherapy [facility]) This is not worse Observe closely  Orders Placed This Encounter  Procedures   IR Radiologist Eval & Mgmt    Standing Status:   Future    Standing Expiration Date:   07/14/2023    Order Specific Question:   Reason for Exam (SYMPTOM  OR DIAGNOSIS REQUIRED)    Answer:   vertebral fracture, due to malignancy, s/p radiation    Order Specific Question:   Is the patient pregnant?    Answer:   No    Order Specific Question:   Preferred Imaging Location?    Answer:   [facility] [place]    All questions were answered. The patient knows to call the clinic with any problems, questions or concerns. The total time spent in the appointment was 30 minutes encounter with patients including review of chart and various tests results, discussions about plan of care and  coordination of care plan   [name], MD 07/13/2022 11:20 AM  INTERVAL HISTORY: Please see below for problem oriented charting. she returns for treatment follow-up with her husband Recently, she started to be more active walked a lot and noticed some sciatica pain on the left side.  The patient have history of chronic degenerative disease with intermittent right-sided sciatica but the left-sided pain brought in some new concerns.  Her dental appointment is next week She is interested could consider vertebroplasty She denies bone pain but have some intermittent muscular discomfort on her back She has noted slight changes with neuropathy in the tips of fingers but not in her toes  REVIEW OF SYSTEMS:   Constitutional: Denies fevers, chills or abnormal weight loss Eyes: Denies blurriness of vision Ears, nose, mouth, throat, and face: Denies mucositis or sore throat Respiratory: Denies cough, dyspnea or wheezes Cardiovascular: Denies palpitation, chest discomfort or lower extremity swelling Gastrointestinal:  Denies nausea, heartburn or change in bowel habits Skin: Denies abnormal skin rashes Lymphatics: Denies new lymphadenopathy or easy bruising Behavioral/Psych: Mood is stable, no new changes  All other systems were reviewed with the patient and are negative.  I have reviewed the past medical history, past surgical history, social history and family history with the patient and they are unchanged from previous note.  ALLERGIES:  is allergic to doxycycline.  MEDICATIONS:  Current Outpatient Medications  Medication Sig Dispense Refill   acetaminophen (TYLENOL) 500 MG tablet Take 1,000 mg by mouth every 6 (six) hours as needed for moderate  pain or headache.     bisacodyl 5 MG EC tablet Take 1 tablet (5 mg total) by mouth daily as needed for moderate constipation. 30 tablet 3   calcium carbonate (TUMS - DOSED IN MG ELEMENTAL CALCIUM) 500 MG chewable tablet Chew 1 tablet by mouth 2 (two)  times daily.     cholecalciferol (VITAMIN D3) 25 MCG (1000 UNIT) tablet Take 2,000 Units by mouth daily.     estradiol (ESTRACE) 0.1 MG/GM vaginal cream PLACE FINGER TIP SIZE AMOUNT OF CREAM AND INSERT SLIGHTLY PAST THE VAGINAL ENTRANCE 3 TIMES DAILY 126 g 4   lidocaine (XYLOCAINE) 2 % solution SMARTSIG:By Mouth     lidocaine-prilocaine (EMLA) cream Apply to affected area once 30 g 3   loratadine (CLARITIN) 10 MG tablet Take 10 mg by mouth daily as needed (for bone aches).     LORazepam (ATIVAN) 0.5 MG tablet Take 1 tablet (0.5 mg total) by mouth 2 (two) times daily as needed for anxiety. (Patient not taking: Reported on 04/26/2022) 30 tablet 0   magic mouthwash (nystatin, diphenhydrAMINE, alum & mag hydroxide) suspension mixture Swish and spit 5 mLs 4 (four) times daily as needed for mouth pain. 240 mL 0   metoprolol succinate (TOPROL XL) 25 MG 24 hr tablet Take 1 tablet (25 mg total) by mouth at bedtime. 30 tablet 6   ondansetron (ZOFRAN) 8 MG tablet Take 1 tablet (8 mg total) by mouth every 8 (eight) hours as needed. 30 tablet 1   prochlorperazine (COMPAZINE) 10 MG tablet Take 1 tablet (10 mg total) by mouth every 6 (six) hours as needed (Nausea or vomiting). 90 tablet 1   senna (SENOKOT) 8.6 MG TABS tablet Take 2 tablets by mouth at bedtime.     No current facility-administered medications for this visit.    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 9.5 x 7.6 x 9.0 cm complex, partially necrotic, mass involving the lower uterine segment/ cervix. No obvious direct extension into the parametrium.  2. 9 mm left pelvic sidewall lymph node is partially necrotic and worrisome for metastatic adenopathy.  3. No findings for abdominal omental or peritoneal surface disease or adenopathy.  4. Tiny low-attenuation lesion in the pancreatic head, likely benign cyst but attention on follow-up scans is suggested.  5. 2.9 cm fundal fibroid.     06/19/2021 Pathology Results   FINAL MICROSCOPIC DIAGNOSIS:   A. UTERINE, CERVICAL MASS, BIOPSY:  - Spindle cell malignancy.  - See comment.   COMMENT:  The biopsies consist of endocervical mucosa with stromal edema and one biopsy fragment has a microscopic focus with atypical spindle cells consistent with poorly differentiated malignancy.  The differential  includes a spindle cell malignancy such as sarcomatoid carcinoma and leiomyosarcoma.  Mullerian adenosarcoma is also a consideration but considered less likely   06/23/2021 Imaging   MR pelvis  10 cm uterine mass with central necrosis, which is centered in the cervix and lower uterine segment. Right parametrial involvement is seen as well as suspected invasion of the distal rectum. Differential diagnosis includes cervical carcinoma and uterine leiomyosarcoma.   Mild bilateral iliac lymphadenopathy, highly suspicious for metastatic disease.   2.9 cm subserosal fibroid in the posterior fundus.   Normal appearance of both ovaries.     06/29/2021 PET scan   1. Hypermetabolic necrotic cervical/uterine mass with bilateral external iliac hypermetabolic lymph nodes. No evidence of distant metastatic disease. 2. 1.5 cm low-attenuation left thyroid nodule. Recommend  thyroid ultrasound. (Ref: J Am Coll Radiol. 2015 Feb;12(2): 143-50).   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, pleomorphic, and myxoid areas and associated necrosis (~20%) - Tumor based in cervix and also involves lower uterine segment - Cervicovaginal margin involved by focal invasive leiomyosarcoma (3:00-5:00, A10) as well as tumor in lymphovascular spaces - Leiomyosarcoma involves right and left parametrial tissue and extends to parametrial margins - Extensive lymphovascular space invasion  present, including in uterus and parametria - See synoptic report and comment   Other findings: - Leiomyomata with hyalinization, size up to 3.0 cm - Ovaries and fallopian tubes with no parenchymal involvement by leiomyosarcoma identified, although adnexal lymphovascular space invasion is present   B: Lymph nodes, right pelvic, lymphadenectomy - One of four lymph nodes positive for metastatic leiomyosarcoma (1/4), with extracapsular extension present   C: Lymph nodes, left pelvic, lymphadenectomy - One of four lymph nodes positive for metastatic leiomyosarcoma (1/4), with extracapsular extension present  Immunohistochemical stains are performed on block A11, and demonstrate that the tumor is positive for desmin and CD10, with SMA staining the majority of the spindle cell component but largely negative in the epithelioid / pleomorphic component. OSCAR, pancytokeratin AE1/AE3, HMB45, and PR appear negative in the tumor. ER shows patchy weak staining and myogenin stains rare cells. Block A23 also shows positive desmin and negative OSCAR pancytokeratin. Overall, the findings are most consistent with leiomyosarcoma, with extensive areas that are myxoid, epithelioid, and pleomorphic as well as more typical spindle cell areas within the overall high grade tumor (grade 3 / 3). The tumor is staged as pT2b (involves other pelvic tissues) given the parametrial involvement and pN1 for FIGO stage IIIC.    07/17/2021 Surgery   Date of Surgery: 07/17/21  Preoperative Diagnosis: High Grade Uterine Sarcoma  Postoperative Diagnosis: Same  Procedure(s): Bilateral - RADICAL ABDOMINAL HYSTER, W/BIL TOTAL PELVIC LYMPHADENECTOMY & PARA-AORTIC LYMPH NODE BX W/WO REM TUBE/OVAR VAGINAL HYSTERECTOMY, FOR UTERUS 250 G OR LESS; WITH REPAIR OF ENTEROCELE COLECTOMY, PARTIAL; WITH COLOPROCTOSTOMY (LOW PELVIC ANASTOMOSIS) WITH COLOSTOMY CYSTOURETHROSCOPY, WITH INSERTION OF INDWELLING URETERAL STENT (EG, GIBBONS OR DOUBLE-J  TYPE) - Cystourethroscopy - Bilateral ureteral stent placement - Foley catheter placement  Performing Service: Gynecology Oncology Surgeon(s) and Role: Panel 1: * Lafonda Mosses, MD - Primary * Bernadene Bell, MD - Resident - Assisting * Devonne Doughty, MD - Resident - Assisting Panel 2: * Franchot Erichsen, MD - Primary  Drains:  - Left 6Fr open-ended ureteral access catheter (green) - Right 5Fr open-ended ureteral access catheter (white) - 16Fr foley catheter to drainage  * No implants in log *  Indications: 56 y.o. female with high grade uterine sarcoma. Urology was consulted pre-operatively for placement of bilateral ureteral stents. Risks, benefits, and alternatives of the above procedure were discussed and informed consent was signed.  OperativeFindings:  - Grossly distorted architecture of urinary bladder likely 2/2 pelvic mass with anterolaterally positioned UOs - Successful placement of bilateral open-ended ureteral catheters under direct visualization - Foley catheter placed at case conclusion  Description: The patient was correctly identified in the preop holding area where written informed consent as well potential risk and complication reviewed. She agreed. The patient was brought to the operative suite where a preinduction timeout was performed. Once correct information was verified, general anesthesia was induced. The patient was then gently placed into  dorsal lithotomy position with SCDs in place for VTE prophylaxis. They were prepped and draped in the usual sterile fashion and given appropriate preoperative antibiotics. A second timeout was then performed.   We inserted a 97F rigid cystoscope per urethra with copious lubrication and normal saline irrigation running. We performed cystourethroscopy, which revealed the above findings.  We turned our attention to the left ureteral orifice and canulated it with a sensor wire, using assistance of a 6Fr  open-ended catheter. The wire was advanced into the renal pelvis without difficulty under visual guidance. We then advanced the stent over our wire into the renal pelvis under direct visualization and feel without complication. The wire was subsequently removed.   We then turned our attention to the right ureteral orifice and canulated it with a sensor wire, using assistance of a 5Fr open-ended catheter. The wire was advanced into the renal pelvis without difficulty under visual guidance. We then advanced the stent over our wire into the renal pelvis under direct visualization and feel without complication. The wire was subsequently removed.   A 16Fr straight catheter was placed, with return of urine indicating appropriate position within the bladder. The balloon was inflated with 10cc sterile water. The stents were secured to the Foley using 0-silk ties, being careful not to occlude the stents or Foley.   The patient was awoken from general anesthesia having tolerated the procedure well and taken to the PACU for routine post-operative recovery.  Post-Op Plan:  - Foley and stents per primary team    07/17/2021 Surgery   Date of Surgery: 07/17/2021  Pre-op Diagnosis: Uterine spindle cell malignancy  Post-op Diagnosis: Same  Procedure(s): Panel 1 RADICAL ABDOMINAL HYSTER, with bilateral S&O, vagineconty upper, bilateral pelvic lyphadenectomy, bilateral ureterolysis,: 34196 (CPT) Panel 2 CYSTOURETHROSCOPY, WITH INSERTION OF INDWELLING URETERAL STENT (EG, GIBBONS OR DOUBLE-J TYPE): 22297 (CPT) Note: Revisions to procedures should be made in chart - see Procedures activity.  Performing Service: Gynecology Oncology Surgeon(s) and Role: Panel 1: * Lafonda Mosses, MD - Primary * Bernadene Bell, MD - Resident - Assisting * Devonne Doughty, MD - Resident - Assisting Panel 2: * Franchot Erichsen, MD - Primary  Findings: On bimanual exam, 10cm necrotic mass filling upper vagina,  unable to discretely palpate the cervix. On rectovaginal exam, rectal involvement not identified. Intraoperatively, normal upper abdominal survey including normal liver, diaphragm, stomach, omentum and bowel. Small uterus with 10cm mass expanding the cervix. Palpably enlarged bilateral pelvic lymph nodes adherent to the external iliac veins and obturator nerves, removed. No palpable para-aortic lymphadenopathy. No rectal involvement of uterine mass.   Specimens:  ID Type Source Tests Collected by Time Destination  1 : uterus,cervix,bilateral tubes/ovaries Tissue Uterus SURGICAL PATHOLOGY EXAM Lafonda Mosses, MD 07/17/2021 0932  2 : right pelvic lymph node Tissue Lymph Node SURGICAL PATHOLOGY EXAM Lafonda Mosses, MD 07/17/2021 1125  3 : LEFT PELVIC LN Tissue Lymph Node SURGICAL PATHOLOGY EXAM Lafonda Mosses, MD 07/17/2021 1144    08/06/2021 Initial Diagnosis   Uterine leiomyosarcoma ([place])   08/06/2021 Cancer Staging   Staging form: Corpus Uteri - Leiomyosarcoma and Endometrial Stromal Sarcoma, AJCC 8th Edition - Pathologic stage from 08/06/2021: FIGO Stage IVB (pT3, pN1, cM1) - Signed by [name], MD on 08/11/2021 Stage prefix: Initial diagnosis   08/10/2021 Imaging   CT abdomen and pelvis 1. Interval development of left lobe pulmonary nodules, measuring up to 7 mm and highly for metastatic disease. 2. Interval development of small to  upper normal lymph nodes in the pelvis, concerning for metastatic disease. 3. Postoperative seroma left pelvic sidewall. 4. Tiny cluster of tree-in-bud opacity in the peripheral right lower lobe is new and compatible with sequelae of atypical infection.   08/13/2021 Procedure   Procedure: Placement of a right IJ approach single lumen PowerPort.  Tip is positioned at the superior cavoatrial junction and catheter is ready for immediate use.  Complications: No immediate   08/14/2021 Echocardiogram    1. Left ventricular ejection fraction, by estimation,  is 60 to 65%. The left ventricle has normal function. The left ventricle has no regional wall motion abnormalities. Left ventricular diastolic parameters were normal. The average left ventricular global longitudinal strain is -17.4 %. The global longitudinal strain is normal.  2. Right ventricular systolic function is normal. The right ventricular size is normal.  3. The mitral valve is normal in structure. No evidence of mitral valve regurgitation. No evidence of mitral stenosis.  4. The aortic valve is tricuspid. Aortic valve regurgitation is not visualized. No aortic stenosis is present.  5. The inferior vena cava is normal in size with greater than 50% respiratory variability, suggesting right atrial pressure of 3 mmHg.     08/17/2021 Imaging   Multiple new and enlarging pulmonary nodules scattered throughout the lungs bilaterally, highly concerning for progressive metastatic disease to the lungs   08/18/2021 - 11/10/2021 Chemotherapy   Patient is on Treatment Plan : UTERINE LEIOMYOSARCOMA Doxorubicin q21d x 6 Cycles     08/18/2021 -  Chemotherapy   Patient is on Treatment Plan : UTERINE UNDIFFERENTIATED LEIOMYOSARCOMA Gemcitabine D1,8 + Docetaxel D8 (900/100) q21d     11/09/2021 Imaging   IMPRESSION: 1. Multiple small bilateral pulmonary nodules, some of which are slightly increased in size. Other nodules unchanged. 2. Interval decrease in size of left pelvic sidewall lymph nodes. 3. Unchanged size of perirectal lymph nodes or soft tissue nodules. These however demonstrate new internal hypodensity, suggesting treatment response and internal necrosis. 4. Unchanged left iliac lymph node or peritoneal nodule. 5. Findings are consistent with mixed response to treatment. No evidence of new metastatic disease in the chest, abdomen, or pelvis. 6. Wall thickening and mucosal hyperenhancement of the bladder, consistent with nonspecific infectious or inflammatory cystitis. Correlate with urinalysis. 7.  Status post hysterectomy and oophorectomy. Interval resolution of a previously noted left pelvic hematoma or seroma. 8. Trace, nonspecific free fluid in the low pelvis.   11/30/2021 Echocardiogram    1. Left ventricular ejection fraction, by estimation, is 40 to 45%. Left ventricular ejection fraction by 3D volume is 41 %. The left ventricle has mildly decreased function. The left ventricle has no regional wall motion abnormalities. Left ventricular  diastolic parameters are consistent with Grade I diastolic dysfunction (impaired relaxation).  2. Right ventricular systolic function is moderately reduced. The right ventricular size is normal.  3. The mitral valve is grossly normal. No evidence of mitral valve regurgitation.  4. The aortic valve is normal in structure. Aortic valve regurgitation is not visualized. No aortic stenosis is present.     12/07/2021 - 05/31/2022 Chemotherapy   Patient is on Treatment Plan : UTERINE UNDIFFERENTIATED / LEIOMYOSARCOMA Gemcitabine D1,8 + Docetaxel D8 (900/100) q21d     02/05/2022 Imaging   Pathologic burst fracture of L3 due to a metastatic lesion, with probable mild degree of right-sided extraosseous tumor extension in the paraspinal soft tissues, and mild involvement of the right pedicle. No epidural/spinal canal involvement.   Multilevel degenerative disc disease without  any significant stenosis in the lumbar spine.   05/31/2022 Imaging   1. Signs of pelvic resolution of pelvic sidewall nodal disease/soft tissue near the LEFT vaginal apex. 2. Decreased conspicuity of RIGHT lower lobe pulmonary nodule and stable LEFT apical pulmonary nodule. 3. Unchanged appearance of pathologic fracture at L3. 4. Urinary bladder wall thickening, slightly improved posteriorly, anterior urinary bladder may show some residual diffuse thickening. Continued correlation with signs of cystitis is suggested.     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 Multiple small bilateral pulmonary nodules, some of which are slightly increased in size. Other nodules unchanged. 2. Interval decrease in size of left pelvic sidewall lymph nodes. 3. Unchanged size of perirectal lymph nodes or soft tissue nodules. These however demonstrate new internal hypodensity, suggesting treatment response and internal necrosis. 4. Unchanged left iliac lymph node or peritoneal nodule. 5. Findings are consistent with mixed response to treatment. No evidence of new metastatic disease in the chest, abdomen, or pelvis. 6. Wall thickening and mucosal hyperenhancement of the bladder, consistent with nonspecific infectious or inflammatory cystitis. Correlate with urinalysis. 7. Status post hysterectomy and oophorectomy. Interval resolution of a previously noted left pelvic hematoma or seroma. 8. Trace, nonspecific free fluid in the low pelvis.   12/07/2021 - 05/31/2022 Chemotherapy   Patient is on Treatment Plan : UTERINE UNDIFFERENTIATED / LEIOMYOSARCOMA Gemcitabine D1,8 + Docetaxel D8 (900/100) q21d       PHYSICAL EXAMINATION: ECOG PERFORMANCE STATUS: 1 - Symptomatic but completely ambulatory  Vitals:   07/13/22 1108  BP: 121/74  Pulse: 85  Resp: 18  Temp: 98 F (36.7 C)  SpO2: 100%   Filed Weights   07/13/22 1108  Weight: 137 lb 6.4 oz (62.3 kg)    GENERAL:alert, no distress and comfortable NEURO: alert & oriented x 3 with fluent speech, no focal motor/sensory deficits  LABORATORY DATA:  I have reviewed the data as listed    Component Value Date/Time   NA 141 06/29/2022 1118   K 4.0 06/29/2022 1118   CL 108  06/29/2022 1118   CO2 29 06/29/2022 1118   GLUCOSE 109 (H) 06/29/2022 1118   BUN 12 06/29/2022 1118   CREATININE 0.62 06/29/2022 1118   CALCIUM 8.5 (L) 06/29/2022 1118   PROT 5.5 (L) 06/29/2022 1118   ALBUMIN 3.4 (L) 06/29/2022 1118   AST 14 (L) 06/29/2022 1118   ALT 11 06/29/2022 1118   ALKPHOS 80 06/29/2022 1118   BILITOT 0.3 06/29/2022 1118   GFRNONAA >60 06/29/2022 1118    No results found for: "SPEP", "UPEP"  Lab Results  Component Value Date   WBC 5.2 07/13/2022   NEUTROABS 4.9 07/13/2022   HGB 11.1 (L) 07/13/2022   HCT 33.5 (L) 07/13/2022   MCV 102.4 (H) 07/13/2022   PLT 225 07/13/2022      Chemistry      Component Value Date/Time   NA 141 06/29/2022 1118   K 4.0 06/29/2022 1118   CL 108 06/29/2022 1118   CO2 29 06/29/2022 1118   BUN 12 06/29/2022 1118   CREATININE 0.62 06/29/2022 1118      Component  Value Date/Time   CALCIUM 8.5 (L) 06/29/2022 1118   ALKPHOS 80 06/29/2022 1118   AST 14 (L) 06/29/2022 1118   ALT 11 06/29/2022 1118   BILITOT 0.3 06/29/2022 1118

## 2022-07-13 NOTE — Assessment & Plan Note (Signed)
This is not worse Observe closely

## 2022-07-13 NOTE — Patient Instructions (Signed)
[facility]  Discharge Instructions: Thank you for choosing [facility] to provide your oncology and hematology care.   If you have a lab appointment with the [facility], please go directly to the [facility] and check in at the registration area.   Wear comfortable clothing and clothing appropriate for easy access to any Portacath or PICC line.   We strive to give you quality time with your provider. You may need to reschedule your appointment if you arrive late (15 or more minutes).  Arriving late affects you and other patients whose appointments are after yours.  Also, if you miss three or more appointments without notifying the office, you may be dismissed from the clinic at the provider's discretion.      For prescription refill requests, have your pharmacy contact our office and allow 72 hours for refills to be completed.    Today you received the following chemotherapy and/or immunotherapy agents gemzar, docetaxel      To help prevent nausea and vomiting after your treatment, we encourage you to take your nausea medication as directed.  BELOW ARE SYMPTOMS THAT SHOULD BE REPORTED IMMEDIATELY: *FEVER GREATER THAN 100.4 F (38 C) OR HIGHER *CHILLS OR SWEATING *NAUSEA AND VOMITING THAT IS NOT CONTROLLED WITH YOUR NAUSEA MEDICATION *UNUSUAL SHORTNESS OF BREATH *UNUSUAL BRUISING OR BLEEDING *URINARY PROBLEMS (pain or burning when urinating, or frequent urination) *BOWEL PROBLEMS (unusual diarrhea, constipation, pain near the anus) TENDERNESS IN MOUTH AND THROAT WITH OR WITHOUT PRESENCE OF ULCERS (sore throat, sores in mouth, or a toothache) UNUSUAL RASH, SWELLING OR PAIN  UNUSUAL VAGINAL DISCHARGE OR ITCHING   Items with * indicate a potential emergency and should be followed up as soon as possible or go to the Emergency Department if any problems should occur.  Please show the CHEMOTHERAPY ALERT CARD or IMMUNOTHERAPY ALERT CARD at  check-in to the Emergency Department and triage nurse.  Should you have questions after your visit or need to cancel or reschedule your appointment, please contact [facility]  Dept: [phone]  and follow the prompts.  Office hours are 8:00 a.m. to 4:30 p.m. Monday - Friday. Please note that voicemails left after 4:00 p.m. may not be returned until the following business day.  We are closed weekends and major holidays. You have access to a nurse at all times for urgent questions. Please call the main number to the clinic Dept: [phone] and follow the prompts.   For any non-urgent questions, you may also contact your provider using MyChart. We now offer e-Visits for anyone 76 and older to request care online for non-urgent symptoms. For details visit [email or web address].   Also download the MyChart app! Go to the app store, search "MyChart", open the app, select [facility], and log in with your MyChart username and password.  Masks are optional in the cancer centers. If you would like for your care team to wear a mask while they are taking care of you, please let them know. You may have one support person who is at least 56 years old accompany you for your appointments.

## 2022-07-13 NOTE — Assessment & Plan Note (Signed)
Her last imaging in August showed positive response to treatment We will continue treatment for another 3 months with plan to repeat CT imaging in November

## 2022-07-13 NOTE — Assessment & Plan Note (Signed)
This is likely due to recent treatment. The patient denies recent history of bleeding such as epistaxis, hematuria or hematochezia. She is asymptomatic from the anemia. I will observe for now.  Her anemia is improving as we space out the interval between treatments

## 2022-07-15 ENCOUNTER — Inpatient Hospital Stay: Payer: BC Managed Care – PPO

## 2022-07-15 ENCOUNTER — Other Ambulatory Visit: Payer: Self-pay

## 2022-07-15 VITALS — BP 127/66 | HR 87 | Temp 98.9°F | Resp 16

## 2022-07-15 DIAGNOSIS — C55 Malignant neoplasm of uterus, part unspecified: Secondary | ICD-10-CM | POA: Diagnosis not present

## 2022-07-15 DIAGNOSIS — C7800 Secondary malignant neoplasm of unspecified lung: Secondary | ICD-10-CM

## 2022-07-15 MED ORDER — PEGFILGRASTIM-CBQV 6 MG/0.6ML ~~LOC~~ SOSY
6.0000 mg | PREFILLED_SYRINGE | Freq: Once | SUBCUTANEOUS | Status: AC
  Administered 2022-07-15: 6 mg via SUBCUTANEOUS
  Filled 2022-07-15: qty 0.6

## 2022-07-27 ENCOUNTER — Inpatient Hospital Stay: Payer: BC Managed Care – PPO

## 2022-07-27 ENCOUNTER — Encounter: Payer: Self-pay | Admitting: Hematology and Oncology

## 2022-07-27 ENCOUNTER — Other Ambulatory Visit: Payer: Self-pay | Admitting: Hematology and Oncology

## 2022-07-27 ENCOUNTER — Other Ambulatory Visit: Payer: Self-pay

## 2022-07-27 ENCOUNTER — Inpatient Hospital Stay (HOSPITAL_BASED_OUTPATIENT_CLINIC_OR_DEPARTMENT_OTHER): Payer: BC Managed Care – PPO | Admitting: Hematology and Oncology

## 2022-07-27 VITALS — BP 118/73 | HR 109 | Temp 98.1°F | Resp 18 | Ht 68.0 in | Wt 131.6 lb

## 2022-07-27 DIAGNOSIS — C55 Malignant neoplasm of uterus, part unspecified: Secondary | ICD-10-CM

## 2022-07-27 DIAGNOSIS — C7800 Secondary malignant neoplasm of unspecified lung: Secondary | ICD-10-CM

## 2022-07-27 DIAGNOSIS — M8448XA Pathological fracture, other site, initial encounter for fracture: Secondary | ICD-10-CM

## 2022-07-27 LAB — CBC WITH DIFFERENTIAL (CANCER CENTER ONLY)
Abs Immature Granulocytes: 0.17 10*3/uL — ABNORMAL HIGH (ref 0.00–0.07)
Basophils Absolute: 0.1 10*3/uL (ref 0.0–0.1)
Basophils Relative: 0 %
Eosinophils Absolute: 0 10*3/uL (ref 0.0–0.5)
Eosinophils Relative: 0 %
HCT: 38 % (ref 36.0–46.0)
Hemoglobin: 12.2 g/dL (ref 12.0–15.0)
Immature Granulocytes: 1 %
Lymphocytes Relative: 7 %
Lymphs Abs: 1 10*3/uL (ref 0.7–4.0)
MCH: 32.9 pg (ref 26.0–34.0)
MCHC: 32.1 g/dL (ref 30.0–36.0)
MCV: 102.4 fL — ABNORMAL HIGH (ref 80.0–100.0)
Monocytes Absolute: 0.7 10*3/uL (ref 0.1–1.0)
Monocytes Relative: 5 %
Neutro Abs: 13.7 10*3/uL — ABNORMAL HIGH (ref 1.7–7.7)
Neutrophils Relative %: 87 %
Platelet Count: 220 10*3/uL (ref 150–400)
RBC: 3.71 MIL/uL — ABNORMAL LOW (ref 3.87–5.11)
RDW: 14.5 % (ref 11.5–15.5)
WBC Count: 15.7 10*3/uL — ABNORMAL HIGH (ref 4.0–10.5)
nRBC: 0 % (ref 0.0–0.2)

## 2022-07-27 LAB — CMP (CANCER CENTER ONLY)
ALT: 8 U/L (ref 0–44)
AST: 12 U/L — ABNORMAL LOW (ref 15–41)
Albumin: 3.5 g/dL (ref 3.5–5.0)
Alkaline Phosphatase: 110 U/L (ref 38–126)
Anion gap: 4 — ABNORMAL LOW (ref 5–15)
BUN: 14 mg/dL (ref 6–20)
CO2: 31 mmol/L (ref 22–32)
Calcium: 8.9 mg/dL (ref 8.9–10.3)
Chloride: 106 mmol/L (ref 98–111)
Creatinine: 0.64 mg/dL (ref 0.44–1.00)
GFR, Estimated: >60 mL/min
Glucose, Bld: 112 mg/dL — ABNORMAL HIGH (ref 70–99)
Potassium: 4.1 mmol/L (ref 3.5–5.1)
Sodium: 141 mmol/L (ref 135–145)
Total Bilirubin: 0.3 mg/dL (ref 0.3–1.2)
Total Protein: 6 g/dL — ABNORMAL LOW (ref 6.5–8.1)

## 2022-07-27 NOTE — Progress Notes (Signed)
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   All questions were answered. The patient knows to call the clinic with any problems, questions or concerns. The total time spent in the appointment was 25 minutes encounter with patients including review of chart and various tests results, discussions about plan of care and coordination of care plan   [name], MD 07/27/2022 11:55 AM  INTERVAL HISTORY: Please see below for problem oriented charting. she returns for treatment follow-up with her husband She has becoming more active recently After recent walking activity, she noticed some intermittent sciatica type pain radiating down to her legs that comes and goes From the chemo perspective, she complains of fatigue  REVIEW OF SYSTEMS:   Constitutional: Denies fevers, chills or abnormal weight loss Eyes: Denies blurriness of vision Ears, nose, mouth, throat, and face: Denies mucositis or sore throat Respiratory: Denies cough, dyspnea or wheezes Cardiovascular: Denies palpitation, chest discomfort or lower extremity swelling Gastrointestinal:  Denies nausea, heartburn or change in bowel habits Skin: Denies abnormal skin rashes Lymphatics: Denies new lymphadenopathy or easy bruising Behavioral/Psych: Mood is stable, no new changes  All other systems were reviewed with the patient and are negative.  I have reviewed the past medical history, past surgical history, social  history and family history with the patient and they are unchanged from previous note.  ALLERGIES:  is allergic to doxycycline.  MEDICATIONS:  Current Outpatient Medications  Medication Sig Dispense Refill   acetaminophen (TYLENOL) 500 MG tablet Take 1,000 mg by mouth every 6 (six) hours as needed for moderate pain or headache.     bisacodyl 5 MG EC tablet Take 1 tablet (5 mg total) by mouth daily as needed for moderate constipation. 30 tablet 3   calcium carbonate (TUMS - DOSED IN MG ELEMENTAL CALCIUM) 500 MG chewable tablet Chew 1 tablet by mouth 2 (two) times daily.     cholecalciferol (VITAMIN D3) 25 MCG (1000 UNIT) tablet Take 2,000 Units by mouth daily.     estradiol (ESTRACE) 0.1 MG/GM vaginal cream PLACE FINGER TIP SIZE AMOUNT OF CREAM AND INSERT SLIGHTLY PAST THE VAGINAL ENTRANCE 3 TIMES DAILY 126 g 4   lidocaine (XYLOCAINE) 2 % solution SMARTSIG:By Mouth     lidocaine-prilocaine (EMLA) cream Apply to affected area once 30 g 3   loratadine (CLARITIN) 10 MG tablet Take 10 mg by mouth daily as needed (for bone aches).     LORazepam (ATIVAN) 0.5 MG tablet Take 1 tablet (0.5 mg total) by mouth 2 (two) times daily as needed for anxiety. (Patient not taking: Reported on 04/26/2022) 30 tablet 0   magic mouthwash (nystatin, diphenhydrAMINE, alum & mag hydroxide) suspension mixture Swish and spit 5 mLs 4 (four) times daily as needed for mouth pain. 240 mL 0   metoprolol succinate (TOPROL XL) 25 MG 24 hr tablet Take 1 tablet (25 mg total) by mouth at bedtime. 30 tablet 6   ondansetron (ZOFRAN) 8 MG tablet Take 1 tablet (8 mg total) by mouth every 8 (eight)  hours as needed. 30 tablet 1   prochlorperazine (COMPAZINE) 10 MG tablet Take 1 tablet (10 mg total) by mouth every 6 (six) hours as needed (Nausea or vomiting). 90 tablet 1   senna (SENOKOT) 8.6 MG TABS tablet Take 2 tablets by mouth at bedtime.     No current facility-administered medications for this visit.    SUMMARY OF ONCOLOGIC  HISTORY: Oncology History  Uterine leiomyosarcoma ([place])  06/11/2021 Imaging   1. 9.5 x 7.6 x 9.0 cm complex, partially necrotic, mass involving the lower uterine segment/ cervix. No obvious direct extension into the parametrium.  2. 9 mm left pelvic sidewall lymph node is partially necrotic and worrisome for metastatic adenopathy.  3. No findings for abdominal omental or peritoneal surface disease or adenopathy.  4. Tiny low-attenuation lesion in the pancreatic head, likely benign cyst but attention on follow-up scans is suggested.  5. 2.9 cm fundal fibroid.    06/19/2021 Pathology Results   FINAL MICROSCOPIC DIAGNOSIS:   A. UTERINE, CERVICAL MASS, BIOPSY:  - Spindle cell malignancy.  - See comment.   COMMENT:  The biopsies consist of endocervical mucosa with stromal edema and one biopsy fragment has a microscopic focus with atypical spindle cells consistent with poorly differentiated malignancy.  The differential  includes a spindle cell malignancy such as sarcomatoid carcinoma and leiomyosarcoma.  Mullerian adenosarcoma is also a consideration but considered less likely   06/23/2021 Imaging   MR pelvis  10 cm uterine mass with central necrosis, which is centered in the cervix and lower uterine segment. Right parametrial involvement is seen as well as suspected invasion of the distal rectum. Differential diagnosis includes cervical carcinoma and uterine leiomyosarcoma.   Mild bilateral iliac lymphadenopathy, highly suspicious for metastatic disease.   2.9 cm subserosal fibroid in the posterior fundus.   Normal appearance of both ovaries.     06/29/2021 PET scan   1. Hypermetabolic necrotic cervical/uterine mass with bilateral external iliac hypermetabolic lymph nodes. No evidence of distant metastatic disease. 2. 1.5 cm low-attenuation left thyroid nodule. Recommend thyroid ultrasound. (Ref: J Am Coll Radiol. 2015 Feb;12(2): 143-50).   07/17/2021 Pathology Results   A: Uterus with  cervix and bilateral ovaries and fallopian tubes, radical hysterectomy and bilateral salpingo-oophorectomy - Leiomyosarcoma, high grade (grade 3 / 3) with extensive epithelioid, pleomorphic, and myxoid areas and associated necrosis (~20%) - Tumor based in cervix and also involves lower uterine segment - Cervicovaginal margin involved by focal invasive leiomyosarcoma (3:00-5:00, A10) as well as tumor in lymphovascular spaces - Leiomyosarcoma involves right and left parametrial tissue and extends to parametrial margins - Extensive lymphovascular space invasion present, including in uterus and parametria - See synoptic report and comment   Other findings: - Leiomyomata with hyalinization, size up to 3.0 cm - Ovaries and fallopian tubes with no parenchymal involvement by leiomyosarcoma identified, although adnexal lymphovascular space invasion is present   B: Lymph nodes, right pelvic, lymphadenectomy - One of four lymph nodes positive for metastatic leiomyosarcoma (1/4), with extracapsular extension present   C: Lymph nodes, left pelvic, lymphadenectomy - One of four lymph nodes positive for metastatic leiomyosarcoma (1/4), with extracapsular extension present  Immunohistochemical stains are performed on block A11, and demonstrate that the tumor is positive for desmin and CD10, with SMA staining the majority of the spindle cell component but largely negative in the epithelioid / pleomorphic component. OSCAR, pancytokeratin AE1/AE3, HMB45, and PR appear negative in the tumor. ER shows patchy weak staining and myogenin stains rare cells. Block A23  also shows positive desmin and negative OSCAR pancytokeratin. Overall, the findings are most consistent with leiomyosarcoma, with extensive areas that are myxoid, epithelioid, and pleomorphic as well as more typical spindle cell areas within the overall high grade tumor (grade 3 / 3). The tumor is staged as pT2b (involves other pelvic tissues) given the  parametrial involvement and pN1 for FIGO stage IIIC.    07/17/2021 Surgery   Date of Surgery: 07/17/21  Preoperative Diagnosis: High Grade Uterine Sarcoma  Postoperative Diagnosis: Same  Procedure(s): Bilateral - RADICAL ABDOMINAL HYSTER, W/BIL TOTAL PELVIC LYMPHADENECTOMY & PARA-AORTIC LYMPH NODE BX W/WO REM TUBE/OVAR VAGINAL HYSTERECTOMY, FOR UTERUS 250 G OR LESS; WITH REPAIR OF ENTEROCELE COLECTOMY, PARTIAL; WITH COLOPROCTOSTOMY (LOW PELVIC ANASTOMOSIS) WITH COLOSTOMY CYSTOURETHROSCOPY, WITH INSERTION OF INDWELLING URETERAL STENT (EG, GIBBONS OR DOUBLE-J TYPE) - Cystourethroscopy - Bilateral ureteral stent placement - Foley catheter placement  Performing Service: Gynecology Oncology Surgeon(s) and Role: Panel 1: * Lafonda Mosses, MD - Primary * Bernadene Bell, MD - Resident - Assisting * Devonne Doughty, MD - Resident - Assisting Panel 2: * Franchot Erichsen, MD - Primary  Drains:  - Left 6Fr open-ended ureteral access catheter (green) - Right 5Fr open-ended ureteral access catheter (white) - 16Fr foley catheter to drainage  * No implants in log *  Indications: 56 y.o. female with high grade uterine sarcoma. Urology was consulted pre-operatively for placement of bilateral ureteral stents. Risks, benefits, and alternatives of the above procedure were discussed and informed consent was signed.  OperativeFindings:  - Grossly distorted architecture of urinary bladder likely 2/2 pelvic mass with anterolaterally positioned UOs - Successful placement of bilateral open-ended ureteral catheters under direct visualization - Foley catheter placed at case conclusion  Description: The patient was correctly identified in the preop holding area where written informed consent as well potential risk and complication reviewed. She agreed. The patient was brought to the operative suite where a preinduction timeout was performed. Once correct information was verified, general  anesthesia was induced. The patient was then gently placed into dorsal lithotomy position with SCDs in place for VTE prophylaxis. They were prepped and draped in the usual sterile fashion and given appropriate preoperative antibiotics. A second timeout was then performed.   We inserted a 59F rigid cystoscope per urethra with copious lubrication and normal saline irrigation running. We performed cystourethroscopy, which revealed the above findings.  We turned our attention to the left ureteral orifice and canulated it with a sensor wire, using assistance of a 6Fr open-ended catheter. The wire was advanced into the renal pelvis without difficulty under visual guidance. We then advanced the stent over our wire into the renal pelvis under direct visualization and feel without complication. The wire was subsequently removed.   We then turned our attention to the right ureteral orifice and canulated it with a sensor wire, using assistance of a 5Fr open-ended catheter. The wire was advanced into the renal pelvis without difficulty under visual guidance. We then advanced the stent over our wire into the renal pelvis under direct visualization and feel without complication. The wire was subsequently removed.   A 16Fr straight catheter was placed, with return of urine indicating appropriate position within the bladder. The balloon was inflated with 10cc sterile water. The stents were secured to the Foley using 0-silk ties, being careful not to occlude the stents or Foley.   The patient was awoken from general anesthesia having tolerated the procedure well and taken to the PACU for routine post-operative  recovery.  Post-Op Plan:  - Foley and stents per primary team    07/17/2021 Surgery   Date of Surgery: 07/17/2021  Pre-op Diagnosis: Uterine spindle cell malignancy  Post-op Diagnosis: Same  Procedure(s): Panel 1 RADICAL ABDOMINAL HYSTER, with bilateral S&O, vagineconty upper, bilateral pelvic  lyphadenectomy, bilateral ureterolysis,: 70263 (CPT) Panel 2 CYSTOURETHROSCOPY, WITH INSERTION OF INDWELLING URETERAL STENT (EG, GIBBONS OR DOUBLE-J TYPE): 78588 (CPT) Note: Revisions to procedures should be made in chart - see Procedures activity.  Performing Service: Gynecology Oncology Surgeon(s) and Role: Panel 1: * Lafonda Mosses, MD - Primary * Bernadene Bell, MD - Resident - Assisting * Devonne Doughty, MD - Resident - Assisting Panel 2: * Franchot Erichsen, MD - Primary  Findings: On bimanual exam, 10cm necrotic mass filling upper vagina, unable to discretely palpate the cervix. On rectovaginal exam, rectal involvement not identified. Intraoperatively, normal upper abdominal survey including normal liver, diaphragm, stomach, omentum and bowel. Small uterus with 10cm mass expanding the cervix. Palpably enlarged bilateral pelvic lymph nodes adherent to the external iliac veins and obturator nerves, removed. No palpable para-aortic lymphadenopathy. No rectal involvement of uterine mass.   Specimens:  ID Type Source Tests Collected by Time Destination  1 : uterus,cervix,bilateral tubes/ovaries Tissue Uterus SURGICAL PATHOLOGY EXAM Lafonda Mosses, MD 07/17/2021 0932  2 : right pelvic lymph node Tissue Lymph Node SURGICAL PATHOLOGY EXAM Lafonda Mosses, MD 07/17/2021 1125  3 : LEFT PELVIC LN Tissue Lymph Node SURGICAL PATHOLOGY EXAM Lafonda Mosses, MD 07/17/2021 1144    08/06/2021 Initial Diagnosis   Uterine leiomyosarcoma ([place])   08/06/2021 Cancer Staging   Staging form: Corpus Uteri - Leiomyosarcoma and Endometrial Stromal Sarcoma, AJCC 8th Edition - Pathologic stage from 08/06/2021: FIGO Stage IVB (pT3, pN1, cM1) - Signed by [name], MD on 08/11/2021 Stage prefix: Initial diagnosis   08/10/2021 Imaging   CT abdomen and pelvis 1. Interval development of left lobe pulmonary nodules, measuring up to 7 mm and highly for metastatic disease. 2. Interval  development of small to upper normal lymph nodes in the pelvis, concerning for metastatic disease. 3. Postoperative seroma left pelvic sidewall. 4. Tiny cluster of tree-in-bud opacity in the peripheral right lower lobe is new and compatible with sequelae of atypical infection.   08/13/2021 Procedure   Procedure: Placement of a right IJ approach single lumen PowerPort.  Tip is positioned at the superior cavoatrial junction and catheter is ready for immediate use.  Complications: No immediate   08/14/2021 Echocardiogram    1. Left ventricular ejection fraction, by estimation, is 60 to 65%. The left ventricle has normal function. The left ventricle has no regional wall motion abnormalities. Left ventricular diastolic parameters were normal. The average left ventricular global longitudinal strain is -17.4 %. The global longitudinal strain is normal.  2. Right ventricular systolic function is normal. The right ventricular size is normal.  3. The mitral valve is normal in structure. No evidence of mitral valve regurgitation. No evidence of mitral stenosis.  4. The aortic valve is tricuspid. Aortic valve regurgitation is not visualized. No aortic stenosis is present.  5. The inferior vena cava is normal in size with greater than 50% respiratory variability, suggesting right atrial pressure of 3 mmHg.     08/17/2021 Imaging   Multiple new and enlarging pulmonary nodules scattered throughout the lungs bilaterally, highly concerning for progressive metastatic disease to the lungs   08/18/2021 - 11/10/2021 Chemotherapy   Patient is on Treatment Plan : UTERINE LEIOMYOSARCOMA  Doxorubicin q21d x 6 Cycles     08/18/2021 -  Chemotherapy   Patient is on Treatment Plan : UTERINE UNDIFFERENTIATED LEIOMYOSARCOMA Gemcitabine D1,8 + Docetaxel D8 (900/100) q21d     11/09/2021 Imaging   IMPRESSION: 1. Multiple small bilateral pulmonary nodules, some of which are slightly increased in size. Other nodules unchanged. 2.  Interval decrease in size of left pelvic sidewall lymph nodes. 3. Unchanged size of perirectal lymph nodes or soft tissue nodules. These however demonstrate new internal hypodensity, suggesting treatment response and internal necrosis. 4. Unchanged left iliac lymph node or peritoneal nodule. 5. Findings are consistent with mixed response to treatment. No evidence of new metastatic disease in the chest, abdomen, or pelvis. 6. Wall thickening and mucosal hyperenhancement of the bladder, consistent with nonspecific infectious or inflammatory cystitis. Correlate with urinalysis. 7. Status post hysterectomy and oophorectomy. Interval resolution of a previously noted left pelvic hematoma or seroma. 8. Trace, nonspecific free fluid in the low pelvis.   11/30/2021 Echocardiogram    1. Left ventricular ejection fraction, by estimation, is 40 to 45%. Left ventricular ejection fraction by 3D volume is 41 %. The left ventricle has mildly decreased function. The left ventricle has no regional wall motion abnormalities. Left ventricular  diastolic parameters are consistent with Grade I diastolic dysfunction (impaired relaxation).  2. Right ventricular systolic function is moderately reduced. The right ventricular size is normal.  3. The mitral valve is grossly normal. No evidence of mitral valve regurgitation.  4. The aortic valve is normal in structure. Aortic valve regurgitation is not visualized. No aortic stenosis is present.     12/07/2021 - 05/31/2022 Chemotherapy   Patient is on Treatment Plan : UTERINE UNDIFFERENTIATED / LEIOMYOSARCOMA Gemcitabine D1,8 + Docetaxel D8 (900/100) q21d     02/05/2022 Imaging   Pathologic burst fracture of L3 due to a metastatic lesion, with probable mild degree of right-sided extraosseous tumor extension in the paraspinal soft tissues, and mild involvement of the right pedicle. No epidural/spinal canal involvement.   Multilevel degenerative disc disease without any  significant stenosis in the lumbar spine.   05/31/2022 Imaging   1. Signs of pelvic resolution of pelvic sidewall nodal disease/soft tissue near the LEFT vaginal apex. 2. Decreased conspicuity of RIGHT lower lobe pulmonary nodule and stable LEFT apical pulmonary nodule. 3. Unchanged appearance of pathologic fracture at L3. 4. Urinary bladder wall thickening, slightly improved posteriorly, anterior urinary bladder may show some residual diffuse thickening. Continued correlation with signs of cystitis is suggested.     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 Multiple small bilateral pulmonary nodules, some of which are slightly increased in size. Other nodules unchanged. 2. Interval decrease in size of left pelvic sidewall lymph nodes. 3. Unchanged size of perirectal lymph nodes or soft tissue nodules. These however demonstrate new internal hypodensity, suggesting treatment response and internal necrosis. 4. Unchanged left iliac lymph node or peritoneal nodule. 5. Findings are consistent with mixed response to treatment. No evidence of new metastatic disease in the chest, abdomen, or pelvis. 6. Wall thickening and mucosal hyperenhancement of the bladder, consistent with nonspecific infectious or inflammatory  cystitis. Correlate with urinalysis. 7. Status post hysterectomy and oophorectomy. Interval resolution of a previously noted left pelvic hematoma or seroma. 8. Trace, nonspecific free fluid in the low pelvis.   12/07/2021 - 05/31/2022 Chemotherapy   Patient is on Treatment Plan : UTERINE UNDIFFERENTIATED / LEIOMYOSARCOMA  Gemcitabine D1,8 + Docetaxel D8 (900/100) q21d       PHYSICAL EXAMINATION: ECOG PERFORMANCE STATUS: 1 - Symptomatic but completely ambulatory  Vitals:   07/27/22 1136  BP: 118/73  Pulse: (!) 109  Resp: 18  Temp: 98.1 F (36.7 C)  SpO2: 100%   Filed Weights   07/27/22 1136  Weight: 131 lb 9.6 oz (59.7 kg)    GENERAL:alert, no distress and comfortable NEURO: alert & oriented x 3 with fluent speech, no focal motor/sensory deficits  LABORATORY DATA:  I have reviewed the data as listed    Component Value Date/Time   NA 139 07/13/2022 1050   K 4.0 07/13/2022 1050   CL 108 07/13/2022 1050   CO2 26 07/13/2022 1050   GLUCOSE 235 (H) 07/13/2022 1050   BUN 14 07/13/2022 1050   CREATININE 0.59 07/13/2022 1050   CALCIUM 8.5 (L) 07/13/2022 1050   PROT 5.4 (L) 07/13/2022 1050   ALBUMIN 3.5 07/13/2022 1050   AST 12 (L) 07/13/2022 1050   ALT 12 07/13/2022 1050   ALKPHOS 66 07/13/2022 1050   BILITOT 0.3 07/13/2022 1050   GFRNONAA >60 07/13/2022 1050    No results found for: "SPEP", "UPEP"  Lab Results  Component Value Date   WBC 15.7 (H) 07/27/2022   NEUTROABS 13.7 (H) 07/27/2022   HGB 12.2 07/27/2022   HCT 38.0 07/27/2022   MCV 102.4 (H) 07/27/2022   PLT 220 07/27/2022      Chemistry      Component Value Date/Time   NA 139 07/13/2022 1050   K 4.0 07/13/2022 1050   CL 108 07/13/2022 1050   CO2 26 07/13/2022 1050   BUN 14 07/13/2022 1050   CREATININE 0.59 07/13/2022 1050      Component Value Date/Time   CALCIUM 8.5 (L) 07/13/2022 1050   ALKPHOS 66 07/13/2022 1050   AST 12 (L) 07/13/2022 1050   ALT 12 07/13/2022 1050   BILITOT 0.3 07/13/2022 1050

## 2022-07-27 NOTE — Assessment & Plan Note (Signed)
Her last imaging in August showed positive response to treatment We will continue treatment for another 3 months with plan to repeat CT imaging in November

## 2022-07-27 NOTE — Assessment & Plan Note (Signed)
Awaiting for IR evaluation for possible kyphoplasty She has intermittent sciatica type pain Recommend pain medicine as needed

## 2022-07-28 ENCOUNTER — Inpatient Hospital Stay: Payer: BC Managed Care – PPO

## 2022-07-28 VITALS — BP 105/68 | HR 79 | Temp 98.1°F | Resp 16

## 2022-07-28 DIAGNOSIS — C7800 Secondary malignant neoplasm of unspecified lung: Secondary | ICD-10-CM

## 2022-07-28 DIAGNOSIS — C55 Malignant neoplasm of uterus, part unspecified: Secondary | ICD-10-CM | POA: Diagnosis not present

## 2022-07-28 MED ORDER — SODIUM CHLORIDE 0.9 % IV SOLN: Freq: Once | INTRAVENOUS | Status: AC

## 2022-07-28 MED ORDER — HEPARIN SOD (PORK) LOCK FLUSH 100 UNIT/ML IV SOLN
500.0000 [IU] | Freq: Once | INTRAVENOUS | Status: AC | PRN
  Administered 2022-07-28: 500 [IU]

## 2022-07-28 MED ORDER — SODIUM CHLORIDE 0.9 % IV SOLN: 8.0000 mg | Freq: Once | INTRAVENOUS | Status: DC

## 2022-07-28 MED ORDER — ONDANSETRON HCL 4 MG/2ML IJ SOLN
8.0000 mg | Freq: Once | INTRAMUSCULAR | Status: AC
  Administered 2022-07-28: 8 mg via INTRAVENOUS
  Filled 2022-07-28: qty 4

## 2022-07-28 MED ORDER — SODIUM CHLORIDE 0.9% FLUSH
10.0000 mL | INTRAVENOUS | Status: DC | PRN
  Administered 2022-07-28: 10 mL

## 2022-07-28 MED ORDER — SODIUM CHLORIDE 0.9 % IV SOLN
720.0000 mg/m2 | Freq: Once | INTRAVENOUS | Status: AC
  Administered 2022-07-28: 1178 mg via INTRAVENOUS
  Filled 2022-07-28: qty 30.98

## 2022-07-28 NOTE — Patient Instructions (Signed)
[facility]  Discharge Instructions: Thank you for choosing [facility] to provide your oncology and hematology care.   If you have a lab appointment with the [facility], please go directly to the [facility] and check in at the registration area.   Wear comfortable clothing and clothing appropriate for easy access to any Portacath or PICC line.   We strive to give you quality time with your provider. You may need to reschedule your appointment if you arrive late (15 or more minutes).  Arriving late affects you and other patients whose appointments are after yours.  Also, if you miss three or more appointments without notifying the office, you may be dismissed from the clinic at the provider's discretion.      For prescription refill requests, have your pharmacy contact our office and allow 72 hours for refills to be completed.    Today you received the following chemotherapy and/or immunotherapy agents gemzar   To help prevent nausea and vomiting after your treatment, we encourage you to take your nausea medication as directed.  BELOW ARE SYMPTOMS THAT SHOULD BE REPORTED IMMEDIATELY: *FEVER GREATER THAN 100.4 F (38 C) OR HIGHER *CHILLS OR SWEATING *NAUSEA AND VOMITING THAT IS NOT CONTROLLED WITH YOUR NAUSEA MEDICATION *UNUSUAL SHORTNESS OF BREATH *UNUSUAL BRUISING OR BLEEDING *URINARY PROBLEMS (pain or burning when urinating, or frequent urination) *BOWEL PROBLEMS (unusual diarrhea, constipation, pain near the anus) TENDERNESS IN MOUTH AND THROAT WITH OR WITHOUT PRESENCE OF ULCERS (sore throat, sores in mouth, or a toothache) UNUSUAL RASH, SWELLING OR PAIN  UNUSUAL VAGINAL DISCHARGE OR ITCHING   Items with * indicate a potential emergency and should be followed up as soon as possible or go to the Emergency Department if any problems should occur.  Please show the CHEMOTHERAPY ALERT CARD or IMMUNOTHERAPY ALERT CARD at check-in to the  Emergency Department and triage nurse.  Should you have questions after your visit or need to cancel or reschedule your appointment, please contact [facility]  Dept: [phone]  and follow the prompts.  Office hours are 8:00 a.m. to 4:30 p.m. Monday - Friday. Please note that voicemails left after 4:00 p.m. may not be returned until the following business day.  We are closed weekends and major holidays. You have access to a nurse at all times for urgent questions. Please call the main number to the clinic Dept: [phone] and follow the prompts.   For any non-urgent questions, you may also contact your provider using MyChart. We now offer e-Visits for anyone 55 and older to request care online for non-urgent symptoms. For details visit [email or web address].   Also download the MyChart app! Go to the app store, search "MyChart", open the app, select [facility], and log in with your MyChart username and password.  Masks are optional in the cancer centers. If you would like for your care team to wear a mask while they are taking care of you, please let them know. You may have one support person who is at least 56 years old accompany you for your appointments.

## 2022-08-03 ENCOUNTER — Ambulatory Visit
Admission: RE | Admit: 2022-08-03 | Discharge: 2022-08-03 | Disposition: A | Discharge status: Home / Self Care | Payer: BC Managed Care – PPO | Source: Ambulatory Visit | Attending: Hematology and Oncology | Admitting: Hematology and Oncology

## 2022-08-03 DIAGNOSIS — C55 Malignant neoplasm of uterus, part unspecified: Secondary | ICD-10-CM

## 2022-08-03 DIAGNOSIS — M8448XA Pathological fracture, other site, initial encounter for fracture: Secondary | ICD-10-CM

## 2022-08-03 HISTORY — PX: IR RADIOLOGIST EVAL & MGMT: IMG5224

## 2022-08-03 NOTE — H&P (Signed)
Interventional Radiology - Clinic Visit, Initial H&P    Referring Provider: [name], MD  Reason for Visit: Pathologic L3 fracture    History of Present Illness  Tammie Gilmore is a 56 y.o. 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  The patient has a history of uterine leiomyosarcoma status post external beam radiation to the pelvis/lumbar spine, and currently undergoing chemotherapy.  The patient's main complaint today is lower back pain, which she describes as stiffness and pressure/strain particularly with prolonged standing (which she defines greater than several minutes).  This results in her having to sit down more frequently, and unable to perform tasks around the house and ambulate for any significant distance or period of time.  She rates the pain is moderate, up to 6/10.  She rates significant disability on the [employer] disability questionnaire with 14/24 positive, citing that she has to stay at home most of the time because of her back pain, has to walk slowly and lie down more frequently, and is unable to do her normal household chores.   The patient was initially diagnosed with acute pathologic fracture of the L3 vertebral body with MRI lumbar spine on February 05, 2022.  Repeat imaging of the chest, abdomen and pelvis in August 2023 demonstrated persistent fracture deformity at L3 with irregularity of the vertebral body consistent with underlying tumor/posttreatment changes.    Additional Past Medical History Past Medical History:  Diagnosis Date   History of radiation therapy    Lumbar Spine- 03/10/22-03/23/22- Dr. Gery Pray   Hypoglycemia    occasional episodes of hypoglycemia   IBS (irritable bowel syndrome)      Surgical History  Past Surgical History:  Procedure Laterality Date   HERNIA REPAIR  1994   left inguinal, with mesh   IR IMAGING  GUIDED PORT INSERTION  08/13/2021     Medications  I have reviewed the current medication list. Refer to chart for details. Current Outpatient Medications  Medication Instructions   acetaminophen (TYLENOL) 1,000 mg, Oral, Every 6 hours PRN   bisacodyl 5 mg, Oral, Daily PRN   calcium carbonate (TUMS - DOSED IN MG ELEMENTAL CALCIUM) 500 MG chewable tablet 1 tablet, Oral, 2 times daily   cholecalciferol (VITAMIN D3) 2,000 Units, Oral, Daily   estradiol (ESTRACE) 0.1 MG/GM vaginal cream PLACE FINGER TIP SIZE AMOUNT OF CREAM AND INSERT SLIGHTLY PAST THE VAGINAL ENTRANCE 3 TIMES DAILY   lidocaine (XYLOCAINE) 2 % solution SMARTSIG:By Mouth   lidocaine-prilocaine (EMLA) cream Apply to affected area once   loratadine (CLARITIN) 10 mg, Oral, Daily PRN   LORazepam (ATIVAN) 0.5 mg, Oral, 2 times daily PRN   magic mouthwash (nystatin, diphenhydrAMINE, alum & mag hydroxide) suspension mixture 5 mLs, Swish & Spit, 4 times daily PRN   metoprolol succinate (TOPROL XL) 25 mg, Oral, Daily at bedtime   ondansetron (ZOFRAN) 8 mg, Oral, Every 8 hours PRN   prochlorperazine (COMPAZINE) 10 mg, Oral, Every 6 hours PRN   senna (SENOKOT) 8.6 MG TABS tablet 2 tablets, Oral, Daily at bedtime      Allergies Allergies  Allergen Reactions   Doxycycline     Dizziness, NAUSEA   Does patient have contrast allergy: No     Physical Exam Current Vitals Temp: 98.5 F (36.9 C) (Temp Source: Oral)  Pulse Rate: 98  Resp: 16  BP: 113/75        Weight: 59.9 kg  Body mass index is 20.07 kg/m.  General: Alert and answers questions appropriately.  Cardiac: Regular rate. No dependent edema. Pulmonary: Normal work of breathing. On room air. Back: Tenderness overlying lower back at expected location of L3.    Pertinent Lab Results    Latest Ref Rng & Units 07/27/2022   11:19 AM 07/13/2022   10:50 AM 06/29/2022   11:18 AM  CBC  WBC 4.0 - 10.5 K/uL 15.7  5.2  12.4   Hemoglobin 12.0 - 15.0 g/dL 12.2  11.1   11.1   Hematocrit 36.0 - 46.0 % 38.0  33.5  34.4   Platelets 150 - 400 K/uL 220  225  198       Latest Ref Rng & Units 07/27/2022   11:19 AM 07/13/2022   10:50 AM 06/29/2022   11:18 AM  CMP  Glucose 70 - 99 mg/dL 112  235  109   BUN 6 - 20 mg/dL '14  14  12   '$ Creatinine 0.44 - 1.00 mg/dL 0.64  0.59  0.62   Sodium 135 - 145 mmol/L 141  139  141   Potassium 3.5 - 5.1 mmol/L 4.1  4.0  4.0   Chloride 98 - 111 mmol/L 106  108  108   CO2 22 - 32 mmol/L '31  26  29   '$ Calcium 8.9 - 10.3 mg/dL 8.9  8.5  8.5   Total Protein 6.5 - 8.1 g/dL 6.0  5.4  5.5   Total Bilirubin 0.3 - 1.2 mg/dL 0.3  0.3  0.3   Alkaline Phos 38 - 126 U/L 110  66  80   AST 15 - 41 U/L '12  12  14   '$ ALT 0 - 44 U/L '8  12  11       '$ Relevant and/or Recent Imaging: As reviewed in HPI     Assessment & Plan:   Patient has suffered subacute malignant fracture due to secondary neoplasm of the spine of the L3 vertebra.   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 It is recommended that patients aged 92 years or older be evaluated for possible testing or treatment of osteoporosis. A copy of this consult report  is sent to the patient's referring physician.  Advanced Care Plan: The patient did not want to provide an [facility] at the time of this visit     Total time spent on today's visit was over 60 Minutes, including both face-to-face time and non face-to-face time, personally spent on review of chart (including labs and relevant imaging), discussing further workup and treatment options, referral to specialist if needed, reviewing outside records if pertinent, answering patient questions, and coordinating care regarding L3 pathologic fracture as well as management strategy.      Albin Felling, MD  Vascular and Interventional Radiology 08/03/2022 4:56 PM

## 2022-08-04 ENCOUNTER — Telehealth: Payer: Self-pay

## 2022-08-04 ENCOUNTER — Other Ambulatory Visit: Payer: Self-pay | Admitting: Hematology and Oncology

## 2022-08-04 DIAGNOSIS — M8580 Other specified disorders of bone density and structure, unspecified site: Secondary | ICD-10-CM | POA: Insufficient documentation

## 2022-08-04 NOTE — Telephone Encounter (Signed)
Patient recommended to have bone density scan completed. Patient agreeable to the exam.  Patient scheduled for 11/2 at 1:45 PM at [facility]. Patient informed to hold any calcium supplements for 48 hours prior to the exam. Patient verbalized an understanding and all questions answered during call.

## 2022-08-05 ENCOUNTER — Other Ambulatory Visit: Payer: Self-pay

## 2022-08-09 MED FILL — Dexamethasone Sodium Phosphate Inj 100 MG/10ML: INTRAMUSCULAR | Qty: 1 | Status: AC

## 2022-08-10 ENCOUNTER — Other Ambulatory Visit: Payer: Self-pay

## 2022-08-10 ENCOUNTER — Inpatient Hospital Stay: Payer: BC Managed Care – PPO

## 2022-08-10 ENCOUNTER — Encounter: Payer: Self-pay | Admitting: Hematology and Oncology

## 2022-08-10 ENCOUNTER — Inpatient Hospital Stay (HOSPITAL_BASED_OUTPATIENT_CLINIC_OR_DEPARTMENT_OTHER): Payer: BC Managed Care – PPO | Admitting: Hematology and Oncology

## 2022-08-10 ENCOUNTER — Other Ambulatory Visit: Payer: Self-pay | Admitting: Hematology and Oncology

## 2022-08-10 VITALS — BP 113/65 | HR 82 | Resp 18 | Ht 68.0 in | Wt 131.4 lb

## 2022-08-10 VITALS — BP 105/71 | HR 92 | Resp 18

## 2022-08-10 DIAGNOSIS — C7800 Secondary malignant neoplasm of unspecified lung: Secondary | ICD-10-CM

## 2022-08-10 DIAGNOSIS — C55 Malignant neoplasm of uterus, part unspecified: Secondary | ICD-10-CM

## 2022-08-10 DIAGNOSIS — T451X5A Adverse effect of antineoplastic and immunosuppressive drugs, initial encounter: Secondary | ICD-10-CM

## 2022-08-10 DIAGNOSIS — M8448XA Pathological fracture, other site, initial encounter for fracture: Secondary | ICD-10-CM

## 2022-08-10 DIAGNOSIS — K1231 Oral mucositis (ulcerative) due to antineoplastic therapy: Secondary | ICD-10-CM

## 2022-08-10 DIAGNOSIS — D6481 Anemia due to antineoplastic chemotherapy: Secondary | ICD-10-CM | POA: Diagnosis not present

## 2022-08-10 LAB — CBC WITH DIFFERENTIAL (CANCER CENTER ONLY)
Abs Immature Granulocytes: 0.01 10*3/uL (ref 0.00–0.07)
Basophils Absolute: 0 10*3/uL (ref 0.0–0.1)
Basophils Relative: 0 %
Eosinophils Absolute: 0 10*3/uL (ref 0.0–0.5)
Eosinophils Relative: 1 %
HCT: 30.5 % — ABNORMAL LOW (ref 36.0–46.0)
Hemoglobin: 9.9 g/dL — ABNORMAL LOW (ref 12.0–15.0)
Immature Granulocytes: 0 %
Lymphocytes Relative: 13 %
Lymphs Abs: 0.6 10*3/uL — ABNORMAL LOW (ref 0.7–4.0)
MCH: 32.5 pg (ref 26.0–34.0)
MCHC: 32.5 g/dL (ref 30.0–36.0)
MCV: 100 fL (ref 80.0–100.0)
Monocytes Absolute: 0.6 10*3/uL (ref 0.1–1.0)
Monocytes Relative: 14 %
Neutro Abs: 3.2 10*3/uL (ref 1.7–7.7)
Neutrophils Relative %: 72 %
Platelet Count: 313 10*3/uL (ref 150–400)
RBC: 3.05 MIL/uL — ABNORMAL LOW (ref 3.87–5.11)
RDW: 13.6 % (ref 11.5–15.5)
WBC Count: 4.5 10*3/uL (ref 4.0–10.5)
nRBC: 0 % (ref 0.0–0.2)

## 2022-08-10 LAB — CMP (CANCER CENTER ONLY)
ALT: 6 U/L (ref 0–44)
AST: 9 U/L — ABNORMAL LOW (ref 15–41)
Albumin: 3.1 g/dL — ABNORMAL LOW (ref 3.5–5.0)
Alkaline Phosphatase: 71 U/L (ref 38–126)
Anion gap: 5 (ref 5–15)
BUN: 13 mg/dL (ref 6–20)
CO2: 29 mmol/L (ref 22–32)
Calcium: 8.5 mg/dL — ABNORMAL LOW (ref 8.9–10.3)
Chloride: 107 mmol/L (ref 98–111)
Creatinine: 0.52 mg/dL (ref 0.44–1.00)
GFR, Estimated: >60 mL/min (ref >60)
Glucose, Bld: 114 mg/dL — ABNORMAL HIGH (ref 70–99)
Potassium: 3.7 mmol/L (ref 3.5–5.1)
Sodium: 141 mmol/L (ref 135–145)
Total Bilirubin: 0.3 mg/dL (ref 0.3–1.2)
Total Protein: 5.8 g/dL — ABNORMAL LOW (ref 6.5–8.1)

## 2022-08-10 MED ORDER — SODIUM CHLORIDE 0.9 % IV SOLN: Freq: Once | INTRAVENOUS | Status: DC

## 2022-08-10 MED ORDER — SODIUM CHLORIDE 0.9 % IV SOLN
720.0000 mg/m2 | Freq: Once | INTRAVENOUS | Status: AC
  Administered 2022-08-10: 1178 mg via INTRAVENOUS
  Filled 2022-08-10: qty 30.98

## 2022-08-10 MED ORDER — SODIUM CHLORIDE 0.9 % IV SOLN
80.0000 mg/m2 | Freq: Once | INTRAVENOUS | Status: AC
  Administered 2022-08-10: 130 mg via INTRAVENOUS
  Filled 2022-08-10: qty 13

## 2022-08-10 MED ORDER — SODIUM CHLORIDE 0.9 % IV SOLN: Freq: Once | INTRAVENOUS | Status: AC

## 2022-08-10 MED ORDER — NYSTATIN 100000 UNIT/ML MT SUSP: 5.0000 mL | Freq: Four times a day (QID) | OROMUCOSAL | 0 refills | Status: DC | PRN

## 2022-08-10 MED ORDER — SODIUM CHLORIDE 0.9 % IV SOLN
10.0000 mg | Freq: Once | INTRAVENOUS | Status: AC
  Administered 2022-08-10: 10 mg via INTRAVENOUS
  Filled 2022-08-10: qty 10

## 2022-08-10 MED ORDER — SODIUM CHLORIDE 0.9% FLUSH
10.0000 mL | INTRAVENOUS | Status: DC | PRN
  Administered 2022-08-10: 10 mL

## 2022-08-10 MED ORDER — HEPARIN SOD (PORK) LOCK FLUSH 100 UNIT/ML IV SOLN
500.0000 [IU] | Freq: Once | INTRAVENOUS | Status: AC | PRN
  Administered 2022-08-10: 500 [IU]

## 2022-08-10 MED ORDER — ONDANSETRON HCL 4 MG/2ML IJ SOLN
8.0000 mg | Freq: Once | INTRAMUSCULAR | Status: AC
  Administered 2022-08-10: 8 mg via INTRAVENOUS
  Filled 2022-08-10: qty 4

## 2022-08-10 NOTE — Patient Instructions (Signed)
[facility]  Discharge Instructions: Thank you for choosing [facility] to provide your oncology and hematology care.   If you have a lab appointment with the [facility], please go directly to the [facility] and check in at the registration area.   Wear comfortable clothing and clothing appropriate for easy access to any Portacath or PICC line.   We strive to give you quality time with your provider. You may need to reschedule your appointment if you arrive late (15 or more minutes).  Arriving late affects you and other patients whose appointments are after yours.  Also, if you miss three or more appointments without notifying the office, you may be dismissed from the clinic at the provider's discretion.      For prescription refill requests, have your pharmacy contact our office and allow 72 hours for refills to be completed.    Today you received the following chemotherapy and/or immunotherapy agents: Gemcitabine, Docetaxel.       To help prevent nausea and vomiting after your treatment, we encourage you to take your nausea medication as directed.  BELOW ARE SYMPTOMS THAT SHOULD BE REPORTED IMMEDIATELY: *FEVER GREATER THAN 100.4 F (38 C) OR HIGHER *CHILLS OR SWEATING *NAUSEA AND VOMITING THAT IS NOT CONTROLLED WITH YOUR NAUSEA MEDICATION *UNUSUAL SHORTNESS OF BREATH *UNUSUAL BRUISING OR BLEEDING *URINARY PROBLEMS (pain or burning when urinating, or frequent urination) *BOWEL PROBLEMS (unusual diarrhea, constipation, pain near the anus) TENDERNESS IN MOUTH AND THROAT WITH OR WITHOUT PRESENCE OF ULCERS (sore throat, sores in mouth, or a toothache) UNUSUAL RASH, SWELLING OR PAIN  UNUSUAL VAGINAL DISCHARGE OR ITCHING   Items with * indicate a potential emergency and should be followed up as soon as possible or go to the Emergency Department if any problems should occur.  Please show the CHEMOTHERAPY ALERT CARD or IMMUNOTHERAPY ALERT CARD  at check-in to the Emergency Department and triage nurse.  Should you have questions after your visit or need to cancel or reschedule your appointment, please contact [facility]  Dept: [phone]  and follow the prompts.  Office hours are 8:00 a.m. to 4:30 p.m. Monday - Friday. Please note that voicemails left after 4:00 p.m. may not be returned until the following business day.  We are closed weekends and major holidays. You have access to a nurse at all times for urgent questions. Please call the main number to the clinic Dept: [phone] and follow the prompts.   For any non-urgent questions, you may also contact your provider using MyChart. We now offer e-Visits for anyone 24 and older to request care online for non-urgent symptoms. For details visit [email or web address].   Also download the MyChart app! Go to the app store, search "MyChart", open the app, select , and log in with your MyChart username and password.  Masks are optional in the cancer centers. If you would like for your care team to wear a mask while they are taking care of you, please let them know. You may have one support person who is at least 56 years old accompany you for your appointments.

## 2022-08-10 NOTE — Progress Notes (Signed)
Infuse Gemcitabine at rate of 10 mg/m2/minute per uterine sarcoma protocol.   Kennith Center, Pharm.D., CPP 08/10/2022'@11'$ :51 AM

## 2022-08-10 NOTE — Assessment & Plan Note (Signed)
Her last imaging in August showed positive response to treatment We will continue treatment and I plan to repeat imaging study again next month If she have complete response to therapy, we will discontinue treatment

## 2022-08-10 NOTE — Assessment & Plan Note (Signed)
Per recommendation from interventional radiologist, she is scheduled for bone density scan prior to kyphoplasty

## 2022-08-10 NOTE — Assessment & Plan Note (Signed)
She has mild intermittent mucositis We will refill her prescription of Magic mouthwash with lidocaine to use as needed

## 2022-08-10 NOTE — Progress Notes (Signed)
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 imaging in August showed positive response to treatment We will continue treatment and I plan to repeat imaging study again next month If she have complete response to therapy, we will discontinue treatment  Anemia due to antineoplastic chemotherapy She is somewhat fatigued We will proceed with treatment without delay  Pathologic fracture of lumbar vertebra Per recommendation from interventional radiologist, she is scheduled for bone density scan prior to kyphoplasty  Mucositis due to antineoplastic therapy She has mild intermittent mucositis We will refill her prescription of Magic mouthwash with lidocaine to use as needed  Orders Placed This Encounter  Procedures   CT CHEST ABDOMEN PELVIS W CONTRAST    Standing Status:   Future    Standing Expiration Date:   08/11/2023    Order Specific Question:   Preferred imaging location?    Answer:   [facility]    Order Specific Question:   Radiology Contrast Protocol - do NOT remove file path    Answer:   [email or web address]    Order Specific Question:   Is patient pregnant?    Answer:   No    All questions were answered. The patient knows to call the clinic with any problems, questions or concerns. The total time spent in the appointment was 30 minutes encounter with patients including review of chart and various tests results, discussions about plan of care and coordination of care plan   [name], MD 08/10/2022 11:51 AM  INTERVAL HISTORY: Please see below for problem oriented charting. she returns for treatment follow-up with her husband She has noted some mild worsening fatigue She also have intermittent lower back discomfort radiating down to her legs Her neuropathy is stable She had numerous  questions related to her upcoming procedures She has intermittent mucositis  REVIEW OF SYSTEMS:   Constitutional: Denies fevers, chills or abnormal weight loss Eyes: Denies blurriness of vision Respiratory: Denies cough, dyspnea or wheezes Cardiovascular: Denies palpitation, chest discomfort or lower extremity swelling Gastrointestinal:  Denies nausea, heartburn or change in bowel habits Skin: Denies abnormal skin rashes Lymphatics: Denies new lymphadenopathy or easy bruising Neurological:Denies numbness, tingling or new weaknesses Behavioral/Psych: Mood is stable, no new changes  All other systems were reviewed with the patient and are negative.  I have reviewed the past medical history, past surgical history, social history and family history with the patient and they are unchanged from previous note.  ALLERGIES:  is allergic to doxycycline.  MEDICATIONS:  Current Outpatient Medications  Medication Sig Dispense Refill   acetaminophen (TYLENOL) 500 MG tablet Take 1,000 mg by mouth every 6 (six) hours as needed for moderate pain or headache.     bisacodyl 5 MG EC tablet Take 1 tablet (5 mg total) by mouth daily as needed for moderate constipation. (Patient not taking: Reported on 08/03/2022) 30 tablet 3   calcium carbonate (TUMS - DOSED IN MG ELEMENTAL CALCIUM) 500 MG chewable tablet Chew 1 tablet by mouth 2 (two) times daily.     cholecalciferol (VITAMIN D3) 25 MCG (1000 UNIT) tablet Take 2,000 Units by mouth daily.     estradiol (ESTRACE) 0.1 MG/GM vaginal cream PLACE FINGER TIP SIZE AMOUNT OF CREAM AND INSERT SLIGHTLY PAST THE VAGINAL ENTRANCE 3 TIMES DAILY (Patient not taking: Reported on 08/03/2022) 126 g 4   lidocaine (XYLOCAINE) 2 % solution  SMARTSIG:By Mouth     lidocaine-prilocaine (EMLA) cream Apply to affected area once 30 g 3   loratadine (CLARITIN) 10 MG tablet Take 10 mg by mouth daily as needed (for bone aches).     LORazepam (ATIVAN) 0.5 MG tablet Take 1 tablet (0.5 mg  total) by mouth 2 (two) times daily as needed for anxiety. (Patient not taking: Reported on 04/26/2022) 30 tablet 0   magic mouthwash (nystatin, diphenhydrAMINE, alum & mag hydroxide) suspension mixture Swish and spit 5 mLs 4 (four) times daily as needed for mouth pain. 240 mL 0   metoprolol succinate (TOPROL XL) 25 MG 24 hr tablet Take 1 tablet (25 mg total) by mouth at bedtime. 30 tablet 6   ondansetron (ZOFRAN) 8 MG tablet Take 1 tablet (8 mg total) by mouth every 8 (eight) hours as needed. (Patient not taking: Reported on 08/03/2022) 30 tablet 1   prochlorperazine (COMPAZINE) 10 MG tablet Take 1 tablet (10 mg total) by mouth every 6 (six) hours as needed (Nausea or vomiting). 90 tablet 1   senna (SENOKOT) 8.6 MG TABS tablet Take 2 tablets by mouth at bedtime.     No current facility-administered medications for this visit.   Facility-Administered Medications Ordered in Other Visits  Medication Dose Route Frequency Provider Last Rate Last Admin   0.9 %  sodium chloride infusion   Intravenous Once Alvy Bimler, Griffon Herberg, MD       dexamethasone (DECADRON) 10 mg in sodium chloride 0.9 % 50 mL IVPB  10 mg Intravenous Once Alvy Bimler, Sasan Wilkie, MD       DOCEtaxel (TAXOTERE) 130 mg in sodium chloride 0.9 % 250 mL chemo infusion  80 mg/m2 (Treatment Plan Recorded) Intravenous Once [name], MD       gemcitabine (GEMZAR) 1,178 mg in sodium chloride 0.9 % 250 mL chemo infusion  720 mg/m2 (Treatment Plan Recorded) Intravenous Once Alvy Bimler, Saron Tweed, MD       heparin lock flush 100 unit/mL  500 Units Intracatheter Once PRN Alvy Bimler, Marykay Mccleod, MD       ondansetron (ZOFRAN) injection 8 mg  8 mg Intravenous Once Ellenie Salome, MD       sodium chloride flush (NS) 0.9 % injection 10 mL  10 mL Intracatheter PRN Alvy Bimler, Glendell Schlottman, MD        SUMMARY OF ONCOLOGIC HISTORY: Oncology History  Uterine leiomyosarcoma ([place])  06/11/2021 Imaging   1. 9.5 x 7.6 x 9.0 cm complex, partially necrotic, mass involving the lower uterine segment/ cervix. No obvious  direct extension into the parametrium.  2. 9 mm left pelvic sidewall lymph node is partially necrotic and worrisome for metastatic adenopathy.  3. No findings for abdominal omental or peritoneal surface disease or adenopathy.  4. Tiny low-attenuation lesion in the pancreatic head, likely benign cyst but attention on follow-up scans is suggested.  5. 2.9 cm fundal fibroid.    06/19/2021 Pathology Results   FINAL MICROSCOPIC DIAGNOSIS:   A. UTERINE, CERVICAL MASS, BIOPSY:  - Spindle cell malignancy.  - See comment.   COMMENT:  The biopsies consist of endocervical mucosa with stromal edema and one biopsy fragment has a microscopic focus with atypical spindle cells consistent with poorly differentiated malignancy.  The differential  includes a spindle cell malignancy such as sarcomatoid carcinoma and leiomyosarcoma.  Mullerian adenosarcoma is also a consideration but considered less likely   06/23/2021 Imaging   MR pelvis  10 cm uterine mass with central necrosis, which is centered in the cervix and lower uterine segment. Right parametrial  involvement is seen as well as suspected invasion of the distal rectum. Differential diagnosis includes cervical carcinoma and uterine leiomyosarcoma.   Mild bilateral iliac lymphadenopathy, highly suspicious for metastatic disease.   2.9 cm subserosal fibroid in the posterior fundus.   Normal appearance of both ovaries.     06/29/2021 PET scan   1. Hypermetabolic necrotic cervical/uterine mass with bilateral external iliac hypermetabolic lymph nodes. No evidence of distant metastatic disease. 2. 1.5 cm low-attenuation left thyroid nodule. Recommend thyroid ultrasound. (Ref: J Am Coll Radiol. 2015 Feb;12(2): 143-50).   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, pleomorphic, and  myxoid areas and associated necrosis (~20%) - Tumor based in cervix and also involves lower uterine segment - Cervicovaginal margin involved by focal invasive leiomyosarcoma (3:00-5:00, A10) as well as tumor in lymphovascular spaces - Leiomyosarcoma involves right and left parametrial tissue and extends to parametrial margins - Extensive lymphovascular space invasion present, including in uterus and parametria - See synoptic report and comment   Other findings: - Leiomyomata with hyalinization, size up to 3.0 cm - Ovaries and fallopian tubes with no parenchymal involvement by leiomyosarcoma identified, although adnexal lymphovascular space invasion is present   B: Lymph nodes, right pelvic, lymphadenectomy - One of four lymph nodes positive for metastatic leiomyosarcoma (1/4), with extracapsular extension present   C: Lymph nodes, left pelvic, lymphadenectomy - One of four lymph nodes positive for metastatic leiomyosarcoma (1/4), with extracapsular extension present  Immunohistochemical stains are performed on block A11, and demonstrate that the tumor is positive for desmin and CD10, with SMA staining the majority of the spindle cell component but largely negative in the epithelioid / pleomorphic component. OSCAR, pancytokeratin AE1/AE3, HMB45, and PR appear negative in the tumor. ER shows patchy weak staining and myogenin stains rare cells. Block A23 also shows positive desmin and negative OSCAR pancytokeratin. Overall, the findings are most consistent with leiomyosarcoma, with extensive areas that are myxoid, epithelioid, and pleomorphic as well as more typical spindle cell areas within the overall high grade tumor (grade 3 / 3). The tumor is staged as pT2b (involves other pelvic tissues) given the parametrial involvement and pN1 for FIGO stage IIIC.    07/17/2021 Surgery   Date of Surgery: 07/17/21  Preoperative Diagnosis: High Grade Uterine Sarcoma  Postoperative Diagnosis:  Same  Procedure(s): Bilateral - RADICAL ABDOMINAL HYSTER, W/BIL TOTAL PELVIC LYMPHADENECTOMY & PARA-AORTIC LYMPH NODE BX W/WO REM TUBE/OVAR VAGINAL HYSTERECTOMY, FOR UTERUS 250 G OR LESS; WITH REPAIR OF ENTEROCELE COLECTOMY, PARTIAL; WITH COLOPROCTOSTOMY (LOW PELVIC ANASTOMOSIS) WITH COLOSTOMY CYSTOURETHROSCOPY, WITH INSERTION OF INDWELLING URETERAL STENT (EG, GIBBONS OR DOUBLE-J TYPE) - Cystourethroscopy - Bilateral ureteral stent placement - Foley catheter placement  Performing Service: Gynecology Oncology Surgeon(s) and Role: Panel 1: * Lafonda Mosses, MD - Primary * Bernadene Bell, MD - Resident - Assisting * Devonne Doughty, MD - Resident - Assisting Panel 2: * Franchot Erichsen, MD - Primary  Drains:  - Left 6Fr open-ended ureteral access catheter (green) - Right 5Fr open-ended ureteral access catheter (white) - 16Fr foley catheter to drainage  * No implants in log *  Indications: 56 y.o. female with high grade uterine sarcoma. Urology was consulted pre-operatively for placement of bilateral ureteral stents. Risks, benefits, and alternatives of the above procedure were discussed and informed consent was signed.  OperativeFindings:  - Grossly distorted architecture of  urinary bladder likely 2/2 pelvic mass with anterolaterally positioned UOs - Successful placement of bilateral open-ended ureteral catheters under direct visualization - Foley catheter placed at case conclusion  Description: The patient was correctly identified in the preop holding area where written informed consent as well potential risk and complication reviewed. She agreed. The patient was brought to the operative suite where a preinduction timeout was performed. Once correct information was verified, general anesthesia was induced. The patient was then gently placed into dorsal lithotomy position with SCDs in place for VTE prophylaxis. They were prepped and draped in the usual sterile fashion and  given appropriate preoperative antibiotics. A second timeout was then performed.   We inserted a 35F rigid cystoscope per urethra with copious lubrication and normal saline irrigation running. We performed cystourethroscopy, which revealed the above findings.  We turned our attention to the left ureteral orifice and canulated it with a sensor wire, using assistance of a 6Fr open-ended catheter. The wire was advanced into the renal pelvis without difficulty under visual guidance. We then advanced the stent over our wire into the renal pelvis under direct visualization and feel without complication. The wire was subsequently removed.   We then turned our attention to the right ureteral orifice and canulated it with a sensor wire, using assistance of a 5Fr open-ended catheter. The wire was advanced into the renal pelvis without difficulty under visual guidance. We then advanced the stent over our wire into the renal pelvis under direct visualization and feel without complication. The wire was subsequently removed.   A 16Fr straight catheter was placed, with return of urine indicating appropriate position within the bladder. The balloon was inflated with 10cc sterile water. The stents were secured to the Foley using 0-silk ties, being careful not to occlude the stents or Foley.   The patient was awoken from general anesthesia having tolerated the procedure well and taken to the PACU for routine post-operative recovery.  Post-Op Plan:  - Foley and stents per primary team    07/17/2021 Surgery   Date of Surgery: 07/17/2021  Pre-op Diagnosis: Uterine spindle cell malignancy  Post-op Diagnosis: Same  Procedure(s): Panel 1 RADICAL ABDOMINAL HYSTER, with bilateral S&O, vagineconty upper, bilateral pelvic lyphadenectomy, bilateral ureterolysis,: 53614 (CPT) Panel 2 CYSTOURETHROSCOPY, WITH INSERTION OF INDWELLING URETERAL STENT (EG, GIBBONS OR DOUBLE-J TYPE): 43154 (CPT) Note: Revisions to procedures  should be made in chart - see Procedures activity.  Performing Service: Gynecology Oncology Surgeon(s) and Role: Panel 1: * Lafonda Mosses, MD - Primary * Bernadene Bell, MD - Resident - Assisting * Devonne Doughty, MD - Resident - Assisting Panel 2: * Franchot Erichsen, MD - Primary  Findings: On bimanual exam, 10cm necrotic mass filling upper vagina, unable to discretely palpate the cervix. On rectovaginal exam, rectal involvement not identified. Intraoperatively, normal upper abdominal survey including normal liver, diaphragm, stomach, omentum and bowel. Small uterus with 10cm mass expanding the cervix. Palpably enlarged bilateral pelvic lymph nodes adherent to the external iliac veins and obturator nerves, removed. No palpable para-aortic lymphadenopathy. No rectal involvement of uterine mass.   Specimens:  ID Type Source Tests Collected by Time Destination  1 : uterus,cervix,bilateral tubes/ovaries Tissue Uterus SURGICAL PATHOLOGY EXAM Lafonda Mosses, MD 07/17/2021 0932  2 : right pelvic lymph node Tissue Lymph Node SURGICAL PATHOLOGY EXAM Lafonda Mosses, MD 07/17/2021 1125  3 : LEFT PELVIC LN Tissue Lymph Node SURGICAL PATHOLOGY EXAM Lafonda Mosses, MD 07/17/2021 1144    08/06/2021 Initial  Diagnosis   Uterine leiomyosarcoma ([place])   08/06/2021 Cancer Staging   Staging form: Corpus Uteri - Leiomyosarcoma and Endometrial Stromal Sarcoma, AJCC 8th Edition - Pathologic stage from 08/06/2021: FIGO Stage IVB (pT3, pN1, cM1) - Signed by [name], MD on 08/11/2021 Stage prefix: Initial diagnosis   08/10/2021 Imaging   CT abdomen and pelvis 1. Interval development of left lobe pulmonary nodules, measuring up to 7 mm and highly for metastatic disease. 2. Interval development of small to upper normal lymph nodes in the pelvis, concerning for metastatic disease. 3. Postoperative seroma left pelvic sidewall. 4. Tiny cluster of tree-in-bud opacity in the peripheral right  lower lobe is new and compatible with sequelae of atypical infection.   08/13/2021 Procedure   Procedure: Placement of a right IJ approach single lumen PowerPort.  Tip is positioned at the superior cavoatrial junction and catheter is ready for immediate use.  Complications: No immediate   08/14/2021 Echocardiogram    1. Left ventricular ejection fraction, by estimation, is 60 to 65%. The left ventricle has normal function. The left ventricle has no regional wall motion abnormalities. Left ventricular diastolic parameters were normal. The average left ventricular global longitudinal strain is -17.4 %. The global longitudinal strain is normal.  2. Right ventricular systolic function is normal. The right ventricular size is normal.  3. The mitral valve is normal in structure. No evidence of mitral valve regurgitation. No evidence of mitral stenosis.  4. The aortic valve is tricuspid. Aortic valve regurgitation is not visualized. No aortic stenosis is present.  5. The inferior vena cava is normal in size with greater than 50% respiratory variability, suggesting right atrial pressure of 3 mmHg.     08/17/2021 Imaging   Multiple new and enlarging pulmonary nodules scattered throughout the lungs bilaterally, highly concerning for progressive metastatic disease to the lungs   08/18/2021 - 11/10/2021 Chemotherapy   Patient is on Treatment Plan : UTERINE LEIOMYOSARCOMA Doxorubicin q21d x 6 Cycles     08/18/2021 -  Chemotherapy   Patient is on Treatment Plan : UTERINE UNDIFFERENTIATED LEIOMYOSARCOMA Gemcitabine D1,8 + Docetaxel D8 (900/100) q21d     11/09/2021 Imaging   IMPRESSION: 1. Multiple small bilateral pulmonary nodules, some of which are slightly increased in size. Other nodules unchanged. 2. Interval decrease in size of left pelvic sidewall lymph nodes. 3. Unchanged size of perirectal lymph nodes or soft tissue nodules. These however demonstrate new internal hypodensity, suggesting treatment  response and internal necrosis. 4. Unchanged left iliac lymph node or peritoneal nodule. 5. Findings are consistent with mixed response to treatment. No evidence of new metastatic disease in the chest, abdomen, or pelvis. 6. Wall thickening and mucosal hyperenhancement of the bladder, consistent with nonspecific infectious or inflammatory cystitis. Correlate with urinalysis. 7. Status post hysterectomy and oophorectomy. Interval resolution of a previously noted left pelvic hematoma or seroma. 8. Trace, nonspecific free fluid in the low pelvis.   11/30/2021 Echocardiogram    1. Left ventricular ejection fraction, by estimation, is 40 to 45%. Left ventricular ejection fraction by 3D volume is 41 %. The left ventricle has mildly decreased function. The left ventricle has no regional wall motion abnormalities. Left ventricular  diastolic parameters are consistent with Grade I diastolic dysfunction (impaired relaxation).  2. Right ventricular systolic function is moderately reduced. The right ventricular size is normal.  3. The mitral valve is grossly normal. No evidence of mitral valve regurgitation.  4. The aortic valve is normal in structure. Aortic valve regurgitation is not  visualized. No aortic stenosis is present.     12/07/2021 - 05/31/2022 Chemotherapy   Patient is on Treatment Plan : UTERINE UNDIFFERENTIATED / LEIOMYOSARCOMA Gemcitabine D1,8 + Docetaxel D8 (900/100) q21d     02/05/2022 Imaging   Pathologic burst fracture of L3 due to a metastatic lesion, with probable mild degree of right-sided extraosseous tumor extension in the paraspinal soft tissues, and mild involvement of the right pedicle. No epidural/spinal canal involvement.   Multilevel degenerative disc disease without any significant stenosis in the lumbar spine.   05/31/2022 Imaging   1. Signs of pelvic resolution of pelvic sidewall nodal disease/soft tissue near the LEFT vaginal apex. 2. Decreased conspicuity of RIGHT lower lobe  pulmonary nodule and stable LEFT apical pulmonary nodule. 3. Unchanged appearance of pathologic fracture at L3. 4. Urinary bladder wall thickening, slightly improved posteriorly, anterior urinary bladder may show some residual diffuse thickening. Continued correlation with signs of cystitis is suggested.     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 Multiple small bilateral pulmonary nodules, some of which are slightly increased in size. Other nodules unchanged. 2. Interval decrease in size of left pelvic sidewall lymph nodes. 3. Unchanged size of perirectal lymph nodes or soft tissue nodules. These however demonstrate new internal hypodensity, suggesting treatment response and internal necrosis. 4. Unchanged left iliac lymph node or peritoneal nodule. 5. Findings are consistent with mixed response to treatment. No evidence of new metastatic disease in the chest, abdomen, or pelvis. 6. Wall thickening and mucosal hyperenhancement of the bladder, consistent with nonspecific infectious or inflammatory cystitis. Correlate with urinalysis. 7. Status post hysterectomy and oophorectomy. Interval resolution of a previously noted left pelvic hematoma or seroma. 8. Trace, nonspecific free fluid in the low pelvis.   12/07/2021 - 05/31/2022 Chemotherapy   Patient is on Treatment Plan : UTERINE UNDIFFERENTIATED / LEIOMYOSARCOMA Gemcitabine D1,8 + Docetaxel D8 (900/100) q21d       PHYSICAL EXAMINATION: ECOG PERFORMANCE STATUS: 1 - Symptomatic but completely ambulatory  Vitals:   08/10/22 1100  BP: 113/65  Pulse: 82  Resp: 18   SpO2: 100%   Filed Weights   08/10/22 1100  Weight: 131 lb 6.4 oz (59.6 kg)    GENERAL:alert, no distress and comfortable  NEURO: alert & oriented x 3 with fluent speech, no focal motor/sensory deficits  LABORATORY DATA:  I have reviewed the data as listed    Component Value Date/Time   NA 141 08/10/2022 1030   K 3.7 08/10/2022 1030   CL 107 08/10/2022 1030   CO2 29 08/10/2022 1030   GLUCOSE 114 (H) 08/10/2022 1030   BUN 13 08/10/2022 1030   CREATININE 0.52 08/10/2022 1030   CALCIUM 8.5 (L) 08/10/2022 1030   PROT 5.8 (L) 08/10/2022 1030   ALBUMIN 3.1 (L) 08/10/2022 1030   AST 9 (L) 08/10/2022 1030   ALT 6 08/10/2022 1030   ALKPHOS 71 08/10/2022 1030   BILITOT 0.3 08/10/2022 1030   GFRNONAA >60 08/10/2022 1030    No results found for: "SPEP", "UPEP"  Lab Results  Component Value Date   WBC 4.5 08/10/2022   NEUTROABS 3.2 08/10/2022  HGB 9.9 (L) 08/10/2022   HCT 30.5 (L) 08/10/2022   MCV 100.0 08/10/2022   PLT 313 08/10/2022      Chemistry      Component Value Date/Time   NA 141 08/10/2022 1030   K 3.7 08/10/2022 1030   CL 107 08/10/2022 1030   CO2 29 08/10/2022 1030   BUN 13 08/10/2022 1030   CREATININE 0.52 08/10/2022 1030      Component Value Date/Time   CALCIUM 8.5 (L) 08/10/2022 1030   ALKPHOS 71 08/10/2022 1030   AST 9 (L) 08/10/2022 1030   ALT 6 08/10/2022 1030   BILITOT 0.3 08/10/2022 1030       RADIOGRAPHIC STUDIES: I have personally reviewed the radiological images as listed and agreed with the findings in the report. IR Radiologist Eval & Mgmt  Result Date: 08/03/2022 EXAM: NEW PATIENT OFFICE VISIT CHIEF COMPLAINT: As documented in EMR HISTORY OF PRESENT ILLNESS: The patient has a history of uterine leiomyosarcoma status post external beam radiation to the pelvis/lumbar spine, and currently undergoing chemotherapy. The patient's main complaint today is lower back pain, which she describes as stiffness and pressure/strain particularly  with prolonged standing (which she defines greater than several minutes). This results in her having to sit down more frequently, and unable to perform tasks around the house and ambulate for any significant distance or period of time. She rates the pain is moderate, up to 6/10. She rates significant disability on the [employer] disability questionnaire with 14/24 positive, citing that she has to stay at home most of the time because of her back pain, has to walk slowly and lie down more frequently, and is unable to do her normal household chores. The patient was initially diagnosed with acute pathologic fracture of the L3 vertebral body with MRI lumbar spine on February 05, 2022. Repeat imaging of the chest, abdomen and pelvis in August 2023 demonstrated persistent fracture deformity at L3 with irregularity of the vertebral body consistent with underlying tumor/posttreatment changes. REVIEW OF SYSTEMS: As documented in EMR PHYSICAL EXAMINATION: As documented in EMR ASSESSMENT AND PLAN: As documented in EMR Electronically Signed   By: Albin Felling M.D.   On: 08/03/2022 17:06

## 2022-08-10 NOTE — Assessment & Plan Note (Signed)
She is somewhat fatigued We will proceed with treatment without delay

## 2022-08-11 ENCOUNTER — Other Ambulatory Visit: Payer: Self-pay

## 2022-08-12 ENCOUNTER — Other Ambulatory Visit: Payer: Self-pay

## 2022-08-12 ENCOUNTER — Ambulatory Visit (HOSPITAL_BASED_OUTPATIENT_CLINIC_OR_DEPARTMENT_OTHER)
Admission: RE | Admit: 2022-08-12 | Discharge: 2022-08-12 | Disposition: A | Discharge status: Home / Self Care | Payer: BC Managed Care – PPO | Source: Ambulatory Visit | Attending: Hematology and Oncology | Admitting: Hematology and Oncology

## 2022-08-12 ENCOUNTER — Telehealth: Payer: Self-pay

## 2022-08-12 ENCOUNTER — Inpatient Hospital Stay: Payer: BC Managed Care – PPO | Attending: Gynecologic Oncology

## 2022-08-12 VITALS — BP 131/75 | HR 92 | Temp 97.9°F | Resp 18

## 2022-08-12 DIAGNOSIS — C7800 Secondary malignant neoplasm of unspecified lung: Secondary | ICD-10-CM

## 2022-08-12 DIAGNOSIS — C55 Malignant neoplasm of uterus, part unspecified: Secondary | ICD-10-CM | POA: Insufficient documentation

## 2022-08-12 DIAGNOSIS — C7951 Secondary malignant neoplasm of bone: Secondary | ICD-10-CM | POA: Diagnosis not present

## 2022-08-12 DIAGNOSIS — M8580 Other specified disorders of bone density and structure, unspecified site: Secondary | ICD-10-CM | POA: Diagnosis present

## 2022-08-12 DIAGNOSIS — C7802 Secondary malignant neoplasm of left lung: Secondary | ICD-10-CM | POA: Diagnosis not present

## 2022-08-12 DIAGNOSIS — Z79899 Other long term (current) drug therapy: Secondary | ICD-10-CM | POA: Diagnosis not present

## 2022-08-12 DIAGNOSIS — M8458XA Pathological fracture in neoplastic disease, other specified site, initial encounter for fracture: Secondary | ICD-10-CM | POA: Diagnosis not present

## 2022-08-12 DIAGNOSIS — Z5189 Encounter for other specified aftercare: Secondary | ICD-10-CM | POA: Insufficient documentation

## 2022-08-12 MED ORDER — PEGFILGRASTIM-CBQV 6 MG/0.6ML ~~LOC~~ SOSY
6.0000 mg | PREFILLED_SYRINGE | Freq: Once | SUBCUTANEOUS | Status: AC
  Administered 2022-08-12: 6 mg via SUBCUTANEOUS
  Filled 2022-08-12: qty 0.6

## 2022-08-12 NOTE — Telephone Encounter (Signed)
yes

## 2022-08-12 NOTE — Telephone Encounter (Signed)
Called and given message about osteopenia. She verbalized understanding. She has a dental appt on 11/8 to have attachments removed from her teeth from invisalign. Then she will be able to have dental xray's. She has a small cavity that the dentist place a filling on 11/8. She is asking if it is okay to have the dental work?

## 2022-08-12 NOTE — Progress Notes (Signed)
Called and left a message for Tye [place] at [facility] at [phone] with Tyrah's info that bone density test has been completed and the report is in Epic.

## 2022-08-12 NOTE — Telephone Encounter (Signed)
-----   Message from [name], MD sent at 08/12/2022  2:26 PM EDT ----- Let her know DEXA scan showed only mild osteopenia IR asked for this, let them know it is done

## 2022-08-12 NOTE — Telephone Encounter (Signed)
Called and given below message. She verbalized understanding. 

## 2022-08-17 ENCOUNTER — Other Ambulatory Visit: Payer: Self-pay

## 2022-08-20 ENCOUNTER — Ambulatory Visit (HOSPITAL_COMMUNITY)
Admission: RE | Admit: 2022-08-20 | Discharge: 2022-08-20 | Disposition: A | Discharge status: Home / Self Care | Payer: BC Managed Care – PPO | Source: Ambulatory Visit | Attending: Hematology and Oncology | Admitting: Hematology and Oncology

## 2022-08-20 ENCOUNTER — Other Ambulatory Visit: Payer: Self-pay

## 2022-08-20 DIAGNOSIS — C7800 Secondary malignant neoplasm of unspecified lung: Secondary | ICD-10-CM | POA: Diagnosis present

## 2022-08-20 DIAGNOSIS — C55 Malignant neoplasm of uterus, part unspecified: Secondary | ICD-10-CM | POA: Insufficient documentation

## 2022-08-20 MED ORDER — IOHEXOL 300 MG/ML  SOLN
100.0000 mL | Freq: Once | INTRAMUSCULAR | Status: AC | PRN
  Administered 2022-08-20: 100 mL via INTRAVENOUS

## 2022-08-20 MED ORDER — SODIUM CHLORIDE (PF) 0.9 % IJ SOLN
INTRAMUSCULAR | Status: AC
  Filled 2022-08-20: qty 50

## 2022-08-23 ENCOUNTER — Inpatient Hospital Stay: Payer: BC Managed Care – PPO

## 2022-08-23 ENCOUNTER — Other Ambulatory Visit: Payer: Self-pay

## 2022-08-23 DIAGNOSIS — C55 Malignant neoplasm of uterus, part unspecified: Secondary | ICD-10-CM | POA: Diagnosis not present

## 2022-08-23 DIAGNOSIS — C7800 Secondary malignant neoplasm of unspecified lung: Secondary | ICD-10-CM

## 2022-08-23 LAB — CMP (CANCER CENTER ONLY)
ALT: 10 U/L (ref 0–44)
AST: 11 U/L — ABNORMAL LOW (ref 15–41)
Albumin: 3.7 g/dL (ref 3.5–5.0)
Alkaline Phosphatase: 123 U/L (ref 38–126)
Anion gap: 5 (ref 5–15)
BUN: 15 mg/dL (ref 6–20)
CO2: 30 mmol/L (ref 22–32)
Calcium: 8.8 mg/dL — ABNORMAL LOW (ref 8.9–10.3)
Chloride: 105 mmol/L (ref 98–111)
Creatinine: 0.7 mg/dL (ref 0.44–1.00)
GFR, Estimated: >60 mL/min (ref >60)
Glucose, Bld: 91 mg/dL (ref 70–99)
Potassium: 4 mmol/L (ref 3.5–5.1)
Sodium: 140 mmol/L (ref 135–145)
Total Bilirubin: 0.3 mg/dL (ref 0.3–1.2)
Total Protein: 6.2 g/dL — ABNORMAL LOW (ref 6.5–8.1)

## 2022-08-23 LAB — CBC WITH DIFFERENTIAL (CANCER CENTER ONLY)
Abs Immature Granulocytes: 0.56 10*3/uL — ABNORMAL HIGH (ref 0.00–0.07)
Basophils Absolute: 0.1 10*3/uL (ref 0.0–0.1)
Basophils Relative: 0 %
Eosinophils Absolute: 0 10*3/uL (ref 0.0–0.5)
Eosinophils Relative: 0 %
HCT: 36.6 % (ref 36.0–46.0)
Hemoglobin: 11.3 g/dL — ABNORMAL LOW (ref 12.0–15.0)
Immature Granulocytes: 3 %
Lymphocytes Relative: 6 %
Lymphs Abs: 1.2 10*3/uL (ref 0.7–4.0)
MCH: 31.9 pg (ref 26.0–34.0)
MCHC: 30.9 g/dL (ref 30.0–36.0)
MCV: 103.4 fL — ABNORMAL HIGH (ref 80.0–100.0)
Monocytes Absolute: 0.9 10*3/uL (ref 0.1–1.0)
Monocytes Relative: 5 %
Neutro Abs: 16.8 10*3/uL — ABNORMAL HIGH (ref 1.7–7.7)
Neutrophils Relative %: 86 %
Platelet Count: 142 10*3/uL — ABNORMAL LOW (ref 150–400)
RBC: 3.54 MIL/uL — ABNORMAL LOW (ref 3.87–5.11)
RDW: 16.3 % — ABNORMAL HIGH (ref 11.5–15.5)
WBC Count: 19.6 10*3/uL — ABNORMAL HIGH (ref 4.0–10.5)
nRBC: 0.2 % (ref 0.0–0.2)

## 2022-08-24 ENCOUNTER — Encounter: Payer: Self-pay | Admitting: Hematology and Oncology

## 2022-08-24 ENCOUNTER — Inpatient Hospital Stay: Payer: BC Managed Care – PPO

## 2022-08-24 ENCOUNTER — Other Ambulatory Visit: Payer: Self-pay

## 2022-08-24 ENCOUNTER — Other Ambulatory Visit: Payer: Self-pay | Admitting: Hematology and Oncology

## 2022-08-24 ENCOUNTER — Inpatient Hospital Stay (HOSPITAL_BASED_OUTPATIENT_CLINIC_OR_DEPARTMENT_OTHER): Payer: BC Managed Care – PPO | Admitting: Hematology and Oncology

## 2022-08-24 VITALS — BP 109/51 | HR 103 | Resp 18 | Ht 68.0 in | Wt 133.0 lb

## 2022-08-24 DIAGNOSIS — C7800 Secondary malignant neoplasm of unspecified lung: Secondary | ICD-10-CM

## 2022-08-24 DIAGNOSIS — M8448XA Pathological fracture, other site, initial encounter for fracture: Secondary | ICD-10-CM | POA: Diagnosis not present

## 2022-08-24 DIAGNOSIS — Z7189 Other specified counseling: Secondary | ICD-10-CM | POA: Diagnosis not present

## 2022-08-24 DIAGNOSIS — C55 Malignant neoplasm of uterus, part unspecified: Secondary | ICD-10-CM | POA: Diagnosis not present

## 2022-08-24 NOTE — Assessment & Plan Note (Signed)
We discussed goals of care and approach to her disease and treatment She is in agreement with the plan of care

## 2022-08-24 NOTE — Assessment & Plan Note (Signed)
She will proceed with kyphoplasty as scheduled She will continue calcium with vitamin D supplement

## 2022-08-24 NOTE — Assessment & Plan Note (Signed)
All the lung nodules are stable/smaller She is not symptomatic Observe only

## 2022-08-24 NOTE — Assessment & Plan Note (Signed)
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

## 2022-08-24 NOTE — Progress Notes (Signed)
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[facility]) I have reviewed multiple imaging studies with the patient and her husband She has new solitary disease progression in the right pelvic lymph node All her measurable disease in the lungs are smaller There are no evidence of disease elsewhere She has gone through 10 cycles of chemotherapy since February of this year I would like to give her a treatment break I recommend targeted therapy to the solitary lymph node with radiation therapy if possible and spare her from treatment for the next few months I will reach out to radiation oncologist for this In the meantime, she is not symptomatic I plan to see her again in about 6 weeks for further follow-up with plan for repeat imaging study in February  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[facility]) All the lung nodules are stable/smaller She is not symptomatic Observe only  Pathologic fracture of lumbar vertebra She will proceed with kyphoplasty as scheduled She will continue calcium with vitamin D supplement  Goals of care, counseling/discussion We discussed goals of care and approach to her disease and treatment She is in agreement with the plan of care  Orders Placed This Encounter  Procedures   CBC with Differential/Platelet    Standing Status:   Standing    Number of Occurrences:   22    Standing Expiration Date:   08/25/2023   Comprehensive metabolic panel    Standing Status:   Standing    Number of Occurrences:   33    Standing Expiration Date:   08/25/2023    All questions were answered. The patient knows to call the clinic with any problems, questions or concerns. The total time spent in the appointment was 40 minutes encounter with patients including review of chart and various tests results, discussions about plan of care and coordination of care  plan   [name], MD 08/24/2022 9:16 AM  INTERVAL HISTORY: Please see below for problem oriented charting. she returns for treatment follow-up review of imaging studies She has intermittent bladder discomfort Denies significant back pain We spent majority of our time reviewing imaging study results  REVIEW OF SYSTEMS:   Constitutional: Denies fevers, chills or abnormal weight loss Eyes: Denies blurriness of vision Ears, nose, mouth, throat, and face: Denies mucositis or sore throat Respiratory: Denies cough, dyspnea or wheezes Cardiovascular: Denies palpitation, chest discomfort or lower extremity swelling Gastrointestinal:  Denies nausea, heartburn or change in bowel habits Skin: Denies abnormal skin rashes Lymphatics: Denies new lymphadenopathy or easy bruising Neurological:Denies numbness, tingling or new weaknesses Behavioral/Psych: Mood is stable, no new changes  All other systems were reviewed with the patient and are negative.  I have reviewed the past medical history, past surgical history, social history and family history with the patient and they are unchanged from previous note.  ALLERGIES:  is allergic to doxycycline.  MEDICATIONS:  Current Outpatient Medications  Medication Sig Dispense Refill   acetaminophen (TYLENOL) 500 MG tablet Take 1,000 mg by mouth every 6 (six) hours as needed for moderate pain or headache.     calcium carbonate (TUMS - DOSED IN MG ELEMENTAL CALCIUM) 500 MG chewable tablet Chew 1 tablet by mouth 2 (two) times daily.     cholecalciferol (VITAMIN D3) 25 MCG (1000 UNIT) tablet Take 2,000 Units by mouth daily.     estradiol (ESTRACE) 0.1 MG/GM vaginal cream PLACE FINGER  TIP SIZE AMOUNT OF CREAM AND INSERT SLIGHTLY PAST THE VAGINAL ENTRANCE 3 TIMES DAILY (Patient not taking: Reported on 08/03/2022) 126 g 4   lidocaine (XYLOCAINE) 2 % solution SMARTSIG:By Mouth     lidocaine-prilocaine (EMLA) cream Apply to affected area once 30 g 3   loratadine  (CLARITIN) 10 MG tablet Take 10 mg by mouth daily as needed (for bone aches).     LORazepam (ATIVAN) 0.5 MG tablet Take 1 tablet (0.5 mg total) by mouth 2 (two) times daily as needed for anxiety. (Patient not taking: Reported on 04/26/2022) 30 tablet 0   magic mouthwash (nystatin, diphenhydrAMINE, alum & mag hydroxide) suspension mixture Swish and spit 5 mLs 4 (four) times daily as needed for mouth pain. 240 mL 0   metoprolol succinate (TOPROL XL) 25 MG 24 hr tablet Take 1 tablet (25 mg total) by mouth at bedtime. 30 tablet 6   ondansetron (ZOFRAN) 8 MG tablet Take 1 tablet (8 mg total) by mouth every 8 (eight) hours as needed. (Patient not taking: Reported on 08/03/2022) 30 tablet 1   prochlorperazine (COMPAZINE) 10 MG tablet Take 1 tablet (10 mg total) by mouth every 6 (six) hours as needed (Nausea or vomiting). 90 tablet 1   senna (SENOKOT) 8.6 MG TABS tablet Take 2 tablets by mouth at bedtime.     No current facility-administered medications for this visit.    SUMMARY OF ONCOLOGIC HISTORY: Oncology History  Uterine leiomyosarcoma ([place])  06/11/2021 Imaging   1. 9.5 x 7.6 x 9.0 cm complex, partially necrotic, mass involving the lower uterine segment/ cervix. No obvious direct extension into the parametrium.  2. 9 mm left pelvic sidewall lymph node is partially necrotic and worrisome for metastatic adenopathy.  3. No findings for abdominal omental or peritoneal surface disease or adenopathy.  4. Tiny low-attenuation lesion in the pancreatic head, likely benign cyst but attention on follow-up scans is suggested.  5. 2.9 cm fundal fibroid.    06/19/2021 Pathology Results   FINAL MICROSCOPIC DIAGNOSIS:   A. UTERINE, CERVICAL MASS, BIOPSY:  - Spindle cell malignancy.  - See comment.   COMMENT:  The biopsies consist of endocervical mucosa with stromal edema and one biopsy fragment has a microscopic focus with atypical spindle cells consistent with poorly differentiated malignancy.  The  differential  includes a spindle cell malignancy such as sarcomatoid carcinoma and leiomyosarcoma.  Mullerian adenosarcoma is also a consideration but considered less likely   06/23/2021 Imaging   MR pelvis  10 cm uterine mass with central necrosis, which is centered in the cervix and lower uterine segment. Right parametrial involvement is seen as well as suspected invasion of the distal rectum. Differential diagnosis includes cervical carcinoma and uterine leiomyosarcoma.   Mild bilateral iliac lymphadenopathy, highly suspicious for metastatic disease.   2.9 cm subserosal fibroid in the posterior fundus.   Normal appearance of both ovaries.     06/29/2021 PET scan   1. Hypermetabolic necrotic cervical/uterine mass with bilateral external iliac hypermetabolic lymph nodes. No evidence of distant metastatic disease. 2. 1.5 cm low-attenuation left thyroid nodule. Recommend thyroid ultrasound. (Ref: J Am Coll Radiol. 2015 Feb;12(2): 143-50).   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, pleomorphic, and myxoid areas and associated necrosis (~20%) - Tumor based in cervix and also involves lower uterine segment - Cervicovaginal margin involved by focal invasive leiomyosarcoma (3:00-5:00,  A10) as well as tumor in lymphovascular spaces - Leiomyosarcoma involves right and left parametrial tissue and extends to parametrial margins - Extensive lymphovascular space invasion present, including in uterus and parametria - See synoptic report and comment   Other findings: - Leiomyomata with hyalinization, size up to 3.0 cm - Ovaries and fallopian tubes with no parenchymal involvement by leiomyosarcoma identified, although adnexal lymphovascular space invasion is present   B: Lymph nodes, right pelvic, lymphadenectomy - One of  four lymph nodes positive for metastatic leiomyosarcoma (1/4), with extracapsular extension present   C: Lymph nodes, left pelvic, lymphadenectomy - One of four lymph nodes positive for metastatic leiomyosarcoma (1/4), with extracapsular extension present  Immunohistochemical stains are performed on block A11, and demonstrate that the tumor is positive for desmin and CD10, with SMA staining the majority of the spindle cell component but largely negative in the epithelioid / pleomorphic component. OSCAR, pancytokeratin AE1/AE3, HMB45, and PR appear negative in the tumor. ER shows patchy weak staining and myogenin stains rare cells. Block A23 also shows positive desmin and negative OSCAR pancytokeratin. Overall, the findings are most consistent with leiomyosarcoma, with extensive areas that are myxoid, epithelioid, and pleomorphic as well as more typical spindle cell areas within the overall high grade tumor (grade 3 / 3). The tumor is staged as pT2b (involves other pelvic tissues) given the parametrial involvement and pN1 for FIGO stage IIIC.    07/17/2021 Surgery   Date of Surgery: 07/17/21  Preoperative Diagnosis: High Grade Uterine Sarcoma  Postoperative Diagnosis: Same  Procedure(s): Bilateral - RADICAL ABDOMINAL HYSTER, W/BIL TOTAL PELVIC LYMPHADENECTOMY & PARA-AORTIC LYMPH NODE BX W/WO REM TUBE/OVAR VAGINAL HYSTERECTOMY, FOR UTERUS 250 G OR LESS; WITH REPAIR OF ENTEROCELE COLECTOMY, PARTIAL; WITH COLOPROCTOSTOMY (LOW PELVIC ANASTOMOSIS) WITH COLOSTOMY CYSTOURETHROSCOPY, WITH INSERTION OF INDWELLING URETERAL STENT (EG, GIBBONS OR DOUBLE-J TYPE) - Cystourethroscopy - Bilateral ureteral stent placement - Foley catheter placement  Performing Service: Gynecology Oncology Surgeon(s) and Role: Panel 1: * Lafonda Mosses, MD - Primary * Bernadene Bell, MD - Resident - Assisting * Devonne Doughty, MD - Resident - Assisting Panel 2: * Franchot Erichsen, MD - Primary  Drains:  -  Left 6Fr open-ended ureteral access catheter (green) - Right 5Fr open-ended ureteral access catheter (white) - 16Fr foley catheter to drainage  * No implants in log *  Indications: 56 y.o. female with high grade uterine sarcoma. Urology was consulted pre-operatively for placement of bilateral ureteral stents. Risks, benefits, and alternatives of the above procedure were discussed and informed consent was signed.  OperativeFindings:  - Grossly distorted architecture of urinary bladder likely 2/2 pelvic mass with anterolaterally positioned UOs - Successful placement of bilateral open-ended ureteral catheters under direct visualization - Foley catheter placed at case conclusion  Description: The patient was correctly identified in the preop holding area where written informed consent as well potential risk and complication reviewed. She agreed. The patient was brought to the operative suite where a preinduction timeout was performed. Once correct information was verified, general anesthesia was induced. The patient was then gently placed into dorsal lithotomy position with SCDs in place for VTE prophylaxis. They were prepped and draped in the usual sterile fashion and given appropriate preoperative antibiotics. A second timeout was then performed.   We inserted a 75F rigid cystoscope per urethra with copious lubrication and normal saline irrigation running. We performed cystourethroscopy, which revealed the above findings.  We turned our attention to the left ureteral orifice and canulated it with  a sensor wire, using assistance of a 6Fr open-ended catheter. The wire was advanced into the renal pelvis without difficulty under visual guidance. We then advanced the stent over our wire into the renal pelvis under direct visualization and feel without complication. The wire was subsequently removed.   We then turned our attention to the right ureteral orifice and canulated it with a sensor wire, using  assistance of a 5Fr open-ended catheter. The wire was advanced into the renal pelvis without difficulty under visual guidance. We then advanced the stent over our wire into the renal pelvis under direct visualization and feel without complication. The wire was subsequently removed.   A 16Fr straight catheter was placed, with return of urine indicating appropriate position within the bladder. The balloon was inflated with 10cc sterile water. The stents were secured to the Foley using 0-silk ties, being careful not to occlude the stents or Foley.   The patient was awoken from general anesthesia having tolerated the procedure well and taken to the PACU for routine post-operative recovery.  Post-Op Plan:  - Foley and stents per primary team    07/17/2021 Surgery   Date of Surgery: 07/17/2021  Pre-op Diagnosis: Uterine spindle cell malignancy  Post-op Diagnosis: Same  Procedure(s): Panel 1 RADICAL ABDOMINAL HYSTER, with bilateral S&O, vagineconty upper, bilateral pelvic lyphadenectomy, bilateral ureterolysis,: 78938 (CPT) Panel 2 CYSTOURETHROSCOPY, WITH INSERTION OF INDWELLING URETERAL STENT (EG, GIBBONS OR DOUBLE-J TYPE): 10175 (CPT) Note: Revisions to procedures should be made in chart - see Procedures activity.  Performing Service: Gynecology Oncology Surgeon(s) and Role: Panel 1: * Lafonda Mosses, MD - Primary * Bernadene Bell, MD - Resident - Assisting * Devonne Doughty, MD - Resident - Assisting Panel 2: * Franchot Erichsen, MD - Primary  Findings: On bimanual exam, 10cm necrotic mass filling upper vagina, unable to discretely palpate the cervix. On rectovaginal exam, rectal involvement not identified. Intraoperatively, normal upper abdominal survey including normal liver, diaphragm, stomach, omentum and bowel. Small uterus with 10cm mass expanding the cervix. Palpably enlarged bilateral pelvic lymph nodes adherent to the external iliac veins and obturator nerves, removed.  No palpable para-aortic lymphadenopathy. No rectal involvement of uterine mass.   Specimens:  ID Type Source Tests Collected by Time Destination  1 : uterus,cervix,bilateral tubes/ovaries Tissue Uterus SURGICAL PATHOLOGY EXAM Lafonda Mosses, MD 07/17/2021 0932  2 : right pelvic lymph node Tissue Lymph Node SURGICAL PATHOLOGY EXAM Lafonda Mosses, MD 07/17/2021 1125  3 : LEFT PELVIC LN Tissue Lymph Node SURGICAL PATHOLOGY EXAM Lafonda Mosses, MD 07/17/2021 1144    08/06/2021 Initial Diagnosis   Uterine leiomyosarcoma ([place])   08/06/2021 Cancer Staging   Staging form: Corpus Uteri - Leiomyosarcoma and Endometrial Stromal Sarcoma, AJCC 8th Edition - Pathologic stage from 08/06/2021: FIGO Stage IVB (pT3, pN1, cM1) - Signed by [name], MD on 08/11/2021 Stage prefix: Initial diagnosis   08/10/2021 Imaging   CT abdomen and pelvis 1. Interval development of left lobe pulmonary nodules, measuring up to 7 mm and highly for metastatic disease. 2. Interval development of small to upper normal lymph nodes in the pelvis, concerning for metastatic disease. 3. Postoperative seroma left pelvic sidewall. 4. Tiny cluster of tree-in-bud opacity in the peripheral right lower lobe is new and compatible with sequelae of atypical infection.   08/13/2021 Procedure   Procedure: Placement of a right IJ approach single lumen PowerPort.  Tip is positioned at the superior cavoatrial junction and catheter is ready for immediate use.  Complications: No immediate   08/14/2021 Echocardiogram    1. Left ventricular ejection fraction, by estimation, is 60 to 65%. The left ventricle has normal function. The left ventricle has no regional wall motion abnormalities. Left ventricular diastolic parameters were normal. The average left ventricular global longitudinal strain is -17.4 %. The global longitudinal strain is normal.  2. Right ventricular systolic function is normal. The right ventricular size is normal.  3.  The mitral valve is normal in structure. No evidence of mitral valve regurgitation. No evidence of mitral stenosis.  4. The aortic valve is tricuspid. Aortic valve regurgitation is not visualized. No aortic stenosis is present.  5. The inferior vena cava is normal in size with greater than 50% respiratory variability, suggesting right atrial pressure of 3 mmHg.     08/17/2021 Imaging   Multiple new and enlarging pulmonary nodules scattered throughout the lungs bilaterally, highly concerning for progressive metastatic disease to the lungs   08/18/2021 - 11/10/2021 Chemotherapy   Patient is on Treatment Plan : UTERINE LEIOMYOSARCOMA Doxorubicin q21d x 6 Cycles     08/18/2021 - 08/12/2022 Chemotherapy   Patient is on Treatment Plan : UTERINE UNDIFFERENTIATED LEIOMYOSARCOMA Gemcitabine D1,8 + Docetaxel D8 (900/100) q21d     11/09/2021 Imaging   IMPRESSION: 1. Multiple small bilateral pulmonary nodules, some of which are slightly increased in size. Other nodules unchanged. 2. Interval decrease in size of left pelvic sidewall lymph nodes. 3. Unchanged size of perirectal lymph nodes or soft tissue nodules. These however demonstrate new internal hypodensity, suggesting treatment response and internal necrosis. 4. Unchanged left iliac lymph node or peritoneal nodule. 5. Findings are consistent with mixed response to treatment. No evidence of new metastatic disease in the chest, abdomen, or pelvis. 6. Wall thickening and mucosal hyperenhancement of the bladder, consistent with nonspecific infectious or inflammatory cystitis. Correlate with urinalysis. 7. Status post hysterectomy and oophorectomy. Interval resolution of a previously noted left pelvic hematoma or seroma. 8. Trace, nonspecific free fluid in the low pelvis.   11/30/2021 Echocardiogram    1. Left ventricular ejection fraction, by estimation, is 40 to 45%. Left ventricular ejection fraction by 3D volume is 41 %. The left ventricle has mildly  decreased function. The left ventricle has no regional wall motion abnormalities. Left ventricular  diastolic parameters are consistent with Grade I diastolic dysfunction (impaired relaxation).  2. Right ventricular systolic function is moderately reduced. The right ventricular size is normal.  3. The mitral valve is grossly normal. No evidence of mitral valve regurgitation.  4. The aortic valve is normal in structure. Aortic valve regurgitation is not visualized. No aortic stenosis is present.     12/07/2021 - 05/31/2022 Chemotherapy   Patient is on Treatment Plan : UTERINE UNDIFFERENTIATED / LEIOMYOSARCOMA Gemcitabine D1,8 + Docetaxel D8 (900/100) q21d     02/05/2022 Imaging   Pathologic burst fracture of L3 due to a metastatic lesion, with probable mild degree of right-sided extraosseous tumor extension in the paraspinal soft tissues, and mild involvement of the right pedicle. No epidural/spinal canal involvement.   Multilevel degenerative disc disease without any significant stenosis in the lumbar spine.   05/31/2022 Imaging   1. Signs of pelvic resolution of pelvic sidewall nodal disease/soft tissue near the LEFT vaginal apex. 2. Decreased conspicuity of RIGHT lower lobe pulmonary nodule and stable LEFT apical pulmonary nodule. 3. Unchanged appearance of pathologic fracture at L3. 4. Urinary bladder wall thickening, slightly improved posteriorly, anterior urinary bladder may show some residual diffuse thickening. Continued correlation  with signs of cystitis is suggested.     08/24/2022 Imaging   1. Interval growth of a solitary right external iliac nodal metastasis. No additional sites of new or progressive metastatic disease. 2. Small bilateral upper lobe pulmonary nodules are stable to mildly decreased. 3. Chronic incompletely healed pathologic L3 vertebral fracture with underlying lytic metastasis, not appreciably changed. No new focal osseous lesions. 4. New trace dependent bilateral  pleural effusions. 5.  Aortic Atherosclerosis (ICD10-I70.0).     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([place])  08/11/2021 Initial Diagnosis   Pulmonary metastases ([place])   08/18/2021 - 11/10/2021 Chemotherapy   Patient is on Treatment Plan : UTERINE LEIOMYOSARCOMA Doxorubicin q21d x 6 Cycles     08/18/2021 - 08/12/2022 Chemotherapy   Patient is on Treatment Plan : UTERINE UNDIFFERENTIATED LEIOMYOSARCOMA Gemcitabine D1,8 + Docetaxel D8 (900/100) q21d     11/09/2021 Imaging   IMPRESSION: 1. Multiple small bilateral pulmonary nodules, some of which are slightly increased in size. Other nodules unchanged. 2. Interval decrease in size of left pelvic sidewall lymph nodes. 3. Unchanged size of perirectal lymph nodes or soft tissue nodules. These however demonstrate new internal hypodensity, suggesting treatment response and internal necrosis. 4. Unchanged left iliac lymph node or peritoneal nodule. 5. Findings are consistent with mixed response to treatment. No evidence of new metastatic disease in the chest, abdomen, or pelvis. 6. Wall thickening and mucosal hyperenhancement of the bladder, consistent with nonspecific infectious or inflammatory cystitis. Correlate with urinalysis. 7. Status post hysterectomy and oophorectomy. Interval resolution of a previously noted left pelvic hematoma or seroma. 8. Trace, nonspecific free fluid in the low pelvis.   12/07/2021 - 05/31/2022 Chemotherapy   Patient is on Treatment Plan : UTERINE UNDIFFERENTIATED / LEIOMYOSARCOMA Gemcitabine D1,8 + Docetaxel D8 (900/100) q21d       PHYSICAL EXAMINATION: ECOG PERFORMANCE STATUS: 1 - Symptomatic but completely ambulatory  Vitals:   08/24/22 0831  BP: (!) 109/51  Pulse: (!) 103  Resp: 18  SpO2: 100%   Filed Weights   08/24/22 0831  Weight: 133 lb (60.3 kg)    GENERAL:alert, no distress and comfortable NEURO: alert & oriented x 3 with fluent speech, no focal motor/sensory deficits  LABORATORY DATA:  I have  reviewed the data as listed    Component Value Date/Time   NA 140 08/23/2022 1103   K 4.0 08/23/2022 1103   CL 105 08/23/2022 1103   CO2 30 08/23/2022 1103   GLUCOSE 91 08/23/2022 1103   BUN 15 08/23/2022 1103   CREATININE 0.70 08/23/2022 1103   CALCIUM 8.8 (L) 08/23/2022 1103   PROT 6.2 (L) 08/23/2022 1103   ALBUMIN 3.7 08/23/2022 1103   AST 11 (L) 08/23/2022 1103   ALT 10 08/23/2022 1103   ALKPHOS 123 08/23/2022 1103   BILITOT 0.3 08/23/2022 1103   GFRNONAA >60 08/23/2022 1103    No results found for: "SPEP", "UPEP"  Lab Results  Component Value Date   WBC 19.6 (H) 08/23/2022   NEUTROABS 16.8 (H) 08/23/2022   HGB 11.3 (L) 08/23/2022   HCT 36.6 08/23/2022   MCV 103.4 (H) 08/23/2022   PLT 142 (L) 08/23/2022      Chemistry      Component Value Date/Time   NA 140 08/23/2022 1103   K 4.0 08/23/2022 1103   CL 105 08/23/2022 1103   CO2 30 08/23/2022 1103   BUN 15 08/23/2022 1103   CREATININE 0.70 08/23/2022 1103      Component Value Date/Time   CALCIUM  8.8 (L) 08/23/2022 1103   ALKPHOS 123 08/23/2022 1103   AST 11 (L) 08/23/2022 1103   ALT 10 08/23/2022 1103   BILITOT 0.3 08/23/2022 1103       RADIOGRAPHIC STUDIES: I have reviewed multiple imaging studies with the patient and her husband I have personally reviewed the radiological images as listed and agreed with the findings in the report. CT CHEST ABDOMEN PELVIS W CONTRAST  Result Date: 08/23/2022 CLINICAL DATA:  Uterine leiomyosarcoma, initially FIGO stage IIIC. TAHBSO 07/17/2021. Chemotherapy in progress. Restaging. * Tracking Code: BO * EXAM: CT CHEST, ABDOMEN, AND PELVIS WITH CONTRAST TECHNIQUE: Multidetector CT imaging of the chest, abdomen and pelvis was performed following the standard protocol during bolus administration of intravenous contrast. RADIATION DOSE REDUCTION: This exam was performed according to the departmental dose-optimization program which includes automated exposure control, adjustment of  the mA and/or kV according to patient size and/or use of iterative reconstruction technique. CONTRAST:  155m OMNIPAQUE IOHEXOL 300 MG/ML  SOLN COMPARISON:  05/28/2022 CT chest, abdomen and pelvis. FINDINGS: CT CHEST FINDINGS Cardiovascular: Normal heart size. No significant pericardial effusion/thickening. Right internal jugular Port-A-Cath terminates at the cavoatrial junction. Great vessels are normal in course and caliber. No central pulmonary emboli. Mediastinum/Nodes: No significant thyroid nodules. Unremarkable esophagus. No pathologically enlarged axillary, mediastinal or hilar lymph nodes. Lungs/Pleura: No pneumothorax. New trace dependent bilateral pleural effusions. Medial right upper lobe solid 0.5 cm pulmonary nodule (series 4/image 34), decreased from 0.7 cm on 05/28/2022 CT. A few additional scattered tiny bilateral solid upper lobe pulmonary nodules measuring up to 0.3 cm on the left (series 4/image 16) without appreciable interval change. No acute consolidative airspace disease or new significant pulmonary nodules. Musculoskeletal: No aggressive appearing focal osseous lesions. Minimal thoracic spondylosis. CT ABDOMEN PELVIS FINDINGS Hepatobiliary: Normal liver with no liver mass. Normal gallbladder with no radiopaque cholelithiasis. No biliary ductal dilatation. Pancreas: Normal, with no mass or duct dilation. Spleen: Normal size. No mass. Adrenals/Urinary Tract: Normal adrenals. Normal kidneys with no hydronephrosis and no renal mass. Mild generalized anterior bladder wall thickening is not appreciably changed. Otherwise normal bladder. Stomach/Bowel: Normal non-distended stomach. Normal caliber small bowel with no small bowel wall thickening. Appendix not discretely visualized. Oral contrast transits to the distal small bowel. Normal large bowel with no diverticulosis, large bowel wall thickening or pericolonic fat stranding. Vascular/Lymphatic: Atherosclerotic nonaneurysmal abdominal aorta.  Patent portal, splenic, hepatic and renal veins. Enlarged 1.4 cm right external iliac node with targetoid enhancement (series 2/image 98), significantly increased from 0.5 cm. No additional pathologically enlarged lymph nodes in the abdomen or pelvis. Reproductive: Status post hysterectomy, with no abnormal findings at the vaginal cuff. No adnexal mass. Other: No pneumoperitoneum, ascites or focal fluid collection. Musculoskeletal: Chronic incompletely healed pathologic L3 vertebral fracture with underlying lytic lesion, not appreciably changed. No new focal osseous lesions. IMPRESSION: 1. Interval growth of a solitary right external iliac nodal metastasis. No additional sites of new or progressive metastatic disease. 2. Small bilateral upper lobe pulmonary nodules are stable to mildly decreased. 3. Chronic incompletely healed pathologic L3 vertebral fracture with underlying lytic metastasis, not appreciably changed. No new focal osseous lesions. 4. New trace dependent bilateral pleural effusions. 5.  Aortic Atherosclerosis (ICD10-I70.0). Electronically Signed   By: JIlona SorrelM.D.   On: 08/23/2022 15:41   DG BONE DENSITY (DXA)  Result Date: 08/12/2022 EXAM: DUAL X-RAY ABSORPTIOMETRY (DXA) FOR BONE MINERAL DENSITY IMPRESSION: Referring Physician:  Avin Upperman [facility] HlthcareYour patient completed a bone mineral density test using GE  Lunar [employer] system (analysis version: 16). Technologist: ALW PATIENT: Name: Larae, Caison Patient ID: [id number] Birth Date: 1966/09/06 Height: 68.0 in. Sex: Female Measured: 08/12/2022 Weight: 131.4 lbs. Indications: Breast Cancer History, Caucasian, Chemo, Estrogen Deficiency, Hysterectomy, Post Menopausal Fractures: Treatments: Vitamin D ASSESSMENT: The BMD measured at DualFemur Total Left is 0.859 g/cm2 with a T-score of -1.2. This patient is considered to have osteopenia/low bone mass according to [facility]) criteria. The can quality is good. L-3 excluded due to  degenerative changes. Site Region Measured Date Measured Age YA BMD Significant CHANGE T-score DualFemur Total Left 08/12/2022    55.9         -1.2    0.859 g/cm2 AP Spine  L1-L4 (L3) 08/12/2022    55.9         1.1     1.325 g/cm2 DualFemur Total Mean 08/12/2022    55.9         -1.1    0.866 g/cm2 World Health Organization [facility]) criteria for post-menopausal, Caucasian Women: Normal       T-score at or above -1 SD Osteopenia   T-score between -1 and -2.5 SD Osteoporosis T-score at or below -2.5 SD RECOMMENDATION: 1. All patients should optimize calcium and vitamin D intake. 2. Consider FDA approved medical therapies in postmenopausal women and men aged 43 years and older, based on the following: a. A hip or vertebral (clinical or morphometric) fracture b. T-score = -2.5 at the femoral neck or spine after appropriate evaluation to exclude secondary causes c. Low bone mass (T-score between -1.0 and -2.5 at the femoral neck or spine) and a 10- year probability of a hip fracture = 3% or a 10 year probability of a major osteoporosis-related fracture = 20% based on the [place]-adapted WHO algorithm. 3. Clinician judgement and/or patient preference may indicate treatment for people with10-year fracture probabilities above or below these levels. FOLLOW-UP: Patients with diagnosis of osteoporosis or at high risk for fracture should have regular bone mineral density tests. For patients eligible for Medicare routine testing is allowed once every 2 years. The testing frequency can be increased to one year for patients who have rapidly progressing disease, those who are receiving or discontinuing medical therapy to restore bone mass, or have additional risk factors. I have reviewed this study and agree with the findings. [facility] Electronically Signed   By: Zerita Boers M.D.   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 The patient's main complaint today is lower back pain, which she describes as stiffness and pressure/strain particularly with prolonged standing (which she defines greater than several minutes). This results in her having to sit down more frequently, and unable to perform tasks around the house and ambulate for any significant distance or period of time. She rates the pain is moderate, up to 6/10. She rates significant disability on the [employer] disability questionnaire with 14/24 positive, citing that she has to stay at home most of the time because of her back pain, has to walk slowly and lie down more frequently, and is unable to do her normal household chores. The patient was initially diagnosed with acute pathologic fracture of the L3 vertebral body with MRI lumbar spine on February 05, 2022.  Repeat imaging of the chest, abdomen and pelvis in August 2023 demonstrated persistent fracture deformity at L3 with irregularity of the vertebral body consistent with underlying tumor/posttreatment changes. REVIEW OF SYSTEMS: As documented in EMR PHYSICAL EXAMINATION: As documented in EMR ASSESSMENT AND PLAN: As documented in EMR Electronically Signed   By: Albin Felling M.D.   On: 08/03/2022 17:06

## 2022-08-26 ENCOUNTER — Other Ambulatory Visit: Payer: Self-pay | Admitting: Hematology and Oncology

## 2022-08-26 ENCOUNTER — Ambulatory Visit
Admission: RE | Admit: 2022-08-26 | Discharge: 2022-08-26 | Disposition: A | Discharge status: Home / Self Care | Payer: BC Managed Care – PPO | Source: Ambulatory Visit | Attending: Hematology and Oncology | Admitting: Hematology and Oncology

## 2022-08-26 DIAGNOSIS — M8448XA Pathological fracture, other site, initial encounter for fracture: Secondary | ICD-10-CM

## 2022-08-26 DIAGNOSIS — C55 Malignant neoplasm of uterus, part unspecified: Secondary | ICD-10-CM

## 2022-08-26 HISTORY — PX: IR KYPHO LUMBAR INC FX REDUCE BONE BX UNI/BIL CANNULATION INC/IMAGING: IMG5519

## 2022-08-26 HISTORY — PX: IR BONE TUMOR(S)RF ABLATION: IMG2284

## 2022-08-26 MED ORDER — SODIUM CHLORIDE 0.9 % IV SOLN: INTRAVENOUS | Status: DC

## 2022-08-26 MED ORDER — MIDAZOLAM HCL 2 MG/2ML IJ SOLN
1.0000 mg | INTRAMUSCULAR | Status: DC | PRN
  Administered 2022-08-26 (×2): 1 mg via INTRAVENOUS
  Administered 2022-08-26: 0.5 mg via INTRAVENOUS
  Administered 2022-08-26 (×2): 1 mg via INTRAVENOUS

## 2022-08-26 MED ORDER — FENTANYL CITRATE PF 50 MCG/ML IJ SOSY
25.0000 ug | PREFILLED_SYRINGE | INTRAMUSCULAR | Status: DC | PRN
  Administered 2022-08-26: 25 ug via INTRAVENOUS
  Administered 2022-08-26 (×2): 50 ug via INTRAVENOUS
  Administered 2022-08-26: 25 ug via INTRAVENOUS
  Administered 2022-08-26: 50 ug via INTRAVENOUS

## 2022-08-26 MED ORDER — CEFAZOLIN SODIUM-DEXTROSE 2-4 GM/100ML-% IV SOLN
2.0000 g | INTRAVENOUS | Status: AC
  Administered 2022-08-26: 2 g via INTRAVENOUS

## 2022-08-26 MED ORDER — KETOROLAC TROMETHAMINE 30 MG/ML IJ SOLN
30.0000 mg | Freq: Once | INTRAMUSCULAR | Status: AC
  Administered 2022-08-26: 30 mg via INTRAVENOUS

## 2022-08-26 NOTE — Discharge Instructions (Signed)
Kyphoplasty Post Procedure Discharge Instructions  May resume a regular diet and any medications that you routinely take (including pain medications). However, if you are taking Aspirin or an anticoagulant/blood thinner you will be told when you can resume taking these by the healthcare provider. No driving day of procedure. The day of your procedure take it easy. You may use an ice pack as needed to injection sites on back.  Ice to back 30 minutes on and 30 minutes off, as needed. May remove bandaids tomorrow after taking a shower. Replace daily with a clean bandaid until healed.  Do not lift anything heavier than a milk jug for 1-2 weeks or determined by your physician.  Follow up with your physician in 2 weeks.    Please contact our office at [phone] for the following symptoms or if you have any questions:  Fever greater than 100 degrees Increased swelling, pain, or redness at injection site. Increased back and/or leg pain New numbness or change in symptoms from before the procedure.    Thank you for visiting [facility]. 

## 2022-08-26 NOTE — Progress Notes (Signed)
Pt back in nursing recovery area. Pt still drowsy from procedure but will wake up when spoken to. Pt follows commands, talks in complete sentences and has no complaints at this time. Pt will remain in nursing station until discharge.  ?

## 2022-08-29 NOTE — Progress Notes (Signed)
GYN Location of Tumor / Histology:  Uterine leiomyosarcoma (HCC)    Tammie Gilmore presented with symptoms of:  No symptoms at this time   Past/Anticipated interventions by medical oncology, if any:   Weight changes, if any: no  Wt Readings from Last 3 Encounters:  08/31/22 133 lb 14.4 oz (60.7 kg)  08/31/22 133 lb 6.4 oz (60.5 kg)  08/26/22 133 lb (60.3 kg)     Bowel/Bladder complaints, if any: Yes.   Dysuria. Constipation due to IBS.   Nausea/Vomiting, if any: no  Pain issues, if any:  yes, reports pain to back due recent back procedure. Patient states area feels swollen at night.   SAFETY ISSUES: Prior radiation? Yes,  Pacemaker/ICD? no Possible current pregnancy? no Is the patient on methotrexate? no  Current Complaints / other details:    BP 122/76   Pulse 86   Temp 98.1 F (36.7 C)   Resp 15   Ht 5\' 8"  (1.727 m)   Wt 133 lb 14.4 oz (60.7 kg)   SpO2 100%   BMI 20.36 kg/m

## 2022-08-30 NOTE — Progress Notes (Signed)
Attempted to follow up after kyphoplasty on 08/26/22. Pt. Did not answer RN left VM and advised pt. To call back at a convenient time.

## 2022-08-30 NOTE — Progress Notes (Signed)
Radiation Oncology         [phone] ________________________________  Outpatient Re-Consultation  Name: Tammie Gilmore MRN: [id number]  Date: 08/31/2022  DOB: 1965/11/30  YI:[id number], [name], MD  [name], MD   REFERRING PHYSICIAN: [name], MD  DIAGNOSIS: There were no encounter diagnoses.   Metastatic uterine leiomyosarcoma; pulmonary and osseous metastasis (L3); now with a new solitary right external iliac nodal metastasis (November 2023)  Interval Since Last Radiation: 5 months and 8 days    Intent: Palliative  Radiation Treatment Dates: 03/10/2022 through 03/23/2022 Site Technique Total Dose (Gy) Dose per Fx (Gy) Completed Fx Beam Energies  Lumbar Spine: Spine 3D 30/30 3 10/10 10X, 15X    HISTORY OF PRESENT ILLNESS::Tammie Gilmore is a 56 y.o. female who is accompanied by ***. she is seen as a courtesy of Dr. Alvy Bimler for re-evaluation and an opinion concerning radiation therapy as part of management for her new solitary area of disease progression in a right pelvic lymph node. I last met with the patient for follow-up on 04/26/22. Since she was last seen, she has completed chemotherapy consisting of Gemcitabine and Docletaxel x 10 cycles under Dr. Alvy Bimler (final dose 05/31/22). She tolerated systemic treatment well other than mild anemia which she was asymptomatic for, and mild peripheral neuropathy which did not effect her ADL's.     CT CAP with contrast on 05/28/22 showed: evidence of resolution of pelvic sidewall nodal disease/soft tissue near the left vaginal apex; a decrease in conspicuity of the right lower lobe pulmonary nodule; stability of the left apical pulmonary nodule; no change in appearance of the pathologic fracture at L3; and persistent signs of cystitis.   Restaging CT CAP with contrast on 08/20/22 however showed interval growth of a solitary right external iliac nodal metastasis, (and new trace dependent bilateral pleural effusions). CT otherwise  showed no additional sites of new or progressive metastatic disease, a mild decrease in size/stability of small bilateral upper lobe pulmonary nodules, and no change of the chronic incompletely healed pathologic L3 vertebral fracture with underlying lytic metastasis.   Given that the patient has already gone through 10 cycles of chemo this year, Dr. Elson Areas referred her to me for consideration of targeted radiation therapy to the solitary lymph node to spare her from further systemic treatment this year.   The patient will return to Dr. Alvy Bimler in approximately 5-6 weeks for repeat imaging and follow-up.    The patient also recently underwent L3 ablation and kyphoplasty on 08/26/22 for management of the persistent fracture deformity at L3.      Other pertinent imaging performed in the interval includes a bone mineral density study on 08/12/22 which showed a left femoral T-score of -1.2, classifying the patient as osteopenic.   PAST MEDICAL HISTORY:  Past Medical History:  Diagnosis Date   History of radiation therapy    Lumbar Spine- 03/10/22-03/23/22- Dr. Gery Pray   Hypoglycemia    occasional episodes of hypoglycemia   IBS (irritable bowel syndrome)     PAST SURGICAL HISTORY: Past Surgical History:  Procedure Laterality Date   HERNIA REPAIR  1994   left inguinal, with mesh   IR BONE TUMOR(S)RF ABLATION  08/26/2022   IR IMAGING GUIDED PORT INSERTION  08/13/2021   IR KYPHO LUMBAR INC FX REDUCE BONE BX UNI/BIL CANNULATION INC/IMAGING  08/26/2022   IR RADIOLOGIST EVAL & MGMT  08/03/2022    FAMILY HISTORY:  Family History  Problem Relation Age of Onset   Heart  attack Mother    Heart disease Mother    Endometriosis Mother    Heart disease Father    Heart attack Father    Cancer - Colon Neg Hx    Breast cancer Neg Hx    Cancer Neg Hx    Ovarian cancer Neg Hx    Uterine cancer Neg Hx    Pancreatic cancer Neg Hx    Pancreatic disease Neg Hx    Prostate cancer Neg Hx      SOCIAL HISTORY:  Social History   Tobacco Use   Smoking status: Never   Smokeless tobacco: Never  Vaping Use   Vaping Use: Never used  Substance Use Topics   Alcohol use: Never   Drug use: Never    ALLERGIES:  Allergies  Allergen Reactions   Doxycycline Nausea Only and Other (See Comments)    Dizziness    MEDICATIONS:  Current Outpatient Medications  Medication Sig Dispense Refill   acetaminophen (TYLENOL) 500 MG tablet Take 1,000 mg by mouth every 6 (six) hours as needed for moderate pain or headache.     calcium carbonate (TUMS - DOSED IN MG ELEMENTAL CALCIUM) 500 MG chewable tablet Chew 1 tablet by mouth 2 (two) times daily.     cholecalciferol (VITAMIN D3) 25 MCG (1000 UNIT) tablet Take 2,000 Units by mouth daily.     estradiol (ESTRACE) 0.1 MG/GM vaginal cream PLACE FINGER TIP SIZE AMOUNT OF CREAM AND INSERT SLIGHTLY PAST THE VAGINAL ENTRANCE 3 TIMES DAILY (Patient not taking: Reported on 08/03/2022) 126 g 4   lidocaine (XYLOCAINE) 2 % solution SMARTSIG:By Mouth     lidocaine-prilocaine (EMLA) cream Apply to affected area once 30 g 3   loratadine (CLARITIN) 10 MG tablet Take 10 mg by mouth daily as needed (for bone aches).     LORazepam (ATIVAN) 0.5 MG tablet Take 1 tablet (0.5 mg total) by mouth 2 (two) times daily as needed for anxiety. (Patient not taking: Reported on 04/26/2022) 30 tablet 0   magic mouthwash (nystatin, diphenhydrAMINE, alum & mag hydroxide) suspension mixture Swish and spit 5 mLs 4 (four) times daily as needed for mouth pain. 240 mL 0   metoprolol succinate (TOPROL XL) 25 MG 24 hr tablet Take 1 tablet (25 mg total) by mouth at bedtime. 30 tablet 6   ondansetron (ZOFRAN) 8 MG tablet Take 1 tablet (8 mg total) by mouth every 8 (eight) hours as needed. (Patient not taking: Reported on 08/03/2022) 30 tablet 1   prochlorperazine (COMPAZINE) 10 MG tablet Take 1 tablet (10 mg total) by mouth every 6 (six) hours as needed (Nausea or vomiting). 90 tablet 1    senna (SENOKOT) 8.6 MG TABS tablet Take 2 tablets by mouth at bedtime.     No current facility-administered medications for this encounter.    REVIEW OF SYSTEMS:  A 10+ POINT REVIEW OF SYSTEMS WAS OBTAINED including neurology, dermatology, psychiatry, cardiac, respiratory, lymph, extremities, GI, GU, musculoskeletal, constitutional, reproductive, HEENT. ***   PHYSICAL EXAM:  vitals were not taken for this visit.   General: Alert and oriented, in no acute distress HEENT: Head is normocephalic. Extraocular movements are intact. Oropharynx is clear. Neck: Neck is supple, no palpable cervical or supraclavicular lymphadenopathy. Heart: Regular in rate and rhythm with no murmurs, rubs, or gallops. Chest: Clear to auscultation bilaterally, with no rhonchi, wheezes, or rales. Abdomen: Soft, nontender, nondistended, with no rigidity or guarding. Extremities: No cyanosis or edema. Lymphatics: see Neck Exam Skin: No concerning lesions. Musculoskeletal: symmetric strength  and muscle tone throughout. Neurologic: Cranial nerves II through XII are grossly intact. No obvious focalities. Speech is fluent. Coordination is intact. Psychiatric: Judgment and insight are intact. Affect is appropriate. ***  ECOG = ***  0 - Asymptomatic (Fully active, able to carry on all predisease activities without restriction)  1 - Symptomatic but completely ambulatory (Restricted in physically strenuous activity but ambulatory and able to carry out work of a light or sedentary nature. For example, light housework, office work)  2 - Symptomatic, <50% in bed during the day (Ambulatory and capable of all self care but unable to carry out any work activities. Up and about more than 50% of waking hours)  3 - Symptomatic, >50% in bed, but not bedbound (Capable of only limited self-care, confined to bed or chair 50% or more of waking hours)  4 - Bedbound (Completely disabled. Cannot carry on any self-care. Totally confined to  bed or chair)  5 - Death   Eustace Pen MM, Creech RH, Tormey DC, et al. [phone]). "Toxicity and response criteria of the [facility]". [place] Oncol. 5 (6): 649-55  LABORATORY DATA:  Lab Results  Component Value Date   WBC 19.6 (H) 08/23/2022   HGB 11.3 (L) 08/23/2022   HCT 36.6 08/23/2022   MCV 103.4 (H) 08/23/2022   PLT 142 (L) 08/23/2022   NEUTROABS 16.8 (H) 08/23/2022   Lab Results  Component Value Date   NA 140 08/23/2022   K 4.0 08/23/2022   CL 105 08/23/2022   CO2 30 08/23/2022   GLUCOSE 91 08/23/2022   BUN 15 08/23/2022   CREATININE 0.70 08/23/2022   CALCIUM 8.8 (L) 08/23/2022      RADIOGRAPHY: IR KYPHO LUMBAR INC FX REDUCE BONE BX UNI/BIL CANNULATION INC/IMAGING  Result Date: 08/26/2022 INDICATION: 56 year old woman with history of uterine leiomyosarcoma with lytic metastasis to the L3 vertebral body resulting in pathologic fracture and intractable back pain presents to IR for RF ablation and kyphoplasty. EXAM: 1. L3 Osteocool RF ablation 2. L3 kyphoplasty COMPARISON:  None Available. MEDICATIONS: As antibiotic prophylaxis, Ancef 2 g IV was ordered pre-procedure and administered intravenously within 1 hour of incision. All current medications are in the EMR and have been reviewed as part of this encounter. ANESTHESIA/SEDATION: Moderate (conscious) sedation was employed during this procedure. A total of Versed 4.5 mg and Fentanyl 225 mcg was administered intravenously by the radiology nurse. Total intra-service moderate Sedation Time: 68 minutes. The patient's level of consciousness and vital signs were monitored continuously by radiology nursing throughout the procedure under my direct supervision. FLUOROSCOPY: Radiation Exposure Index (as provided by the fluoroscopic device): 75 mGy Kerma COMPLICATIONS: None immediate. PROCEDURE: Following a full explanation of the procedure along with the potential associated complications, an informed witnessed consent  was obtained. The patient was positioned prone on the exam table. Bipedicular access was planned. Skin entry site(s) were marked overlying the L3 vertebral body using fluoroscopy. The overlying skin was then prepped and draped in the standard sterile fashion. Local analgesia was obtained with 1% lidocaine. Attention was first turned to the right side. Under fluoroscopic guidance, a 10 gauge introducer needle was advanced towards the lateral margin of the pedicle. Using multiple projections, the introducer needle was advanced towards the posterior margin of the vertebral body via a transpedicular approach. The posterior margin of the ablation zone was then marked using the inner needle of the introducer. The inner needle was then removed, and the marking drill was advanced towards the  anterior margin of the vertebral body for RF probe selection. The 10 mm RF ablation probe was determined to be appropriate. Attention was then turned to the contralateral side. Under fluoroscopic guidance, a 10 gauge introducer needle was advanced towards the lateral margin of the pedicle. Using multiple projections, the introducer needle was advanced towards the posterior margin of the vertebral body via a transpedicular approach. Again, the posterior margin of the ablation zone was then marked using the inner needle of the introducer. The marking drill was then advanced towards the anterior margin of the vertebral body for RF probe selection. The 10 mm RF ablation probe was determined to be appropriate. The selected RF ablation probes were then advanced through the bilateral transpedicular access needles and locked into position. Appropriate location within the vertebral body was confirmed with multiple projections. RF ablation was then performed for 7 minutes and 30 seconds the patient tolerated this portion of the procedure well without significant discomfort. The RF probes were then removed. Partial fracture reduction and bony  cavity creation was then performed using an inflatable bone tamp through both cannulas. The bone tamps were removed, and vertebral augmentation was performed through both cannulas with careful application of 6 mL of firm consistency bone cement under lateral fluoroscopic guidance. The cannulas were removed and hemostasis achieved with manual compression. IMPRESSION: 1. Successful L3 Osteocool RFA ablation. 2. Successful L3 kyphoplasty. If the patient has known osteoporosis, recommend treatment as clinically indicated. If the patient's bone density status is unknown, DEXA scan is recommended. Electronically Signed   By: Miachel Roux M.D.   On: 08/26/2022 16:58   IR Bone Tumor(s)RF Ablation  Result Date: 08/26/2022 INDICATION: 56 year old woman with history of uterine leiomyosarcoma with lytic metastasis to the L3 vertebral body resulting in pathologic fracture and intractable back pain presents to IR for RF ablation and kyphoplasty. EXAM: 1. L3 Osteocool RF ablation 2. L3 kyphoplasty COMPARISON:  None Available. MEDICATIONS: As antibiotic prophylaxis, Ancef 2 g IV was ordered pre-procedure and administered intravenously within 1 hour of incision. All current medications are in the EMR and have been reviewed as part of this encounter. ANESTHESIA/SEDATION: Moderate (conscious) sedation was employed during this procedure. A total of Versed 4.5 mg and Fentanyl 225 mcg was administered intravenously by the radiology nurse. Total intra-service moderate Sedation Time: 68 minutes. The patient's level of consciousness and vital signs were monitored continuously by radiology nursing throughout the procedure under my direct supervision. FLUOROSCOPY: Radiation Exposure Index (as provided by the fluoroscopic device): 75 mGy Kerma COMPLICATIONS: None immediate. PROCEDURE: Following a full explanation of the procedure along with the potential associated complications, an informed witnessed consent was obtained. The patient was  positioned prone on the exam table. Bipedicular access was planned. Skin entry site(s) were marked overlying the L3 vertebral body using fluoroscopy. The overlying skin was then prepped and draped in the standard sterile fashion. Local analgesia was obtained with 1% lidocaine. Attention was first turned to the right side. Under fluoroscopic guidance, a 10 gauge introducer needle was advanced towards the lateral margin of the pedicle. Using multiple projections, the introducer needle was advanced towards the posterior margin of the vertebral body via a transpedicular approach. The posterior margin of the ablation zone was then marked using the inner needle of the introducer. The inner needle was then removed, and the marking drill was advanced towards the anterior margin of the vertebral body for RF probe selection. The 10 mm RF ablation probe was determined to be appropriate. Attention  was then turned to the contralateral side. Under fluoroscopic guidance, a 10 gauge introducer needle was advanced towards the lateral margin of the pedicle. Using multiple projections, the introducer needle was advanced towards the posterior margin of the vertebral body via a transpedicular approach. Again, the posterior margin of the ablation zone was then marked using the inner needle of the introducer. The marking drill was then advanced towards the anterior margin of the vertebral body for RF probe selection. The 10 mm RF ablation probe was determined to be appropriate. The selected RF ablation probes were then advanced through the bilateral transpedicular access needles and locked into position. Appropriate location within the vertebral body was confirmed with multiple projections. RF ablation was then performed for 7 minutes and 30 seconds the patient tolerated this portion of the procedure well without significant discomfort. The RF probes were then removed. Partial fracture reduction and bony cavity creation was then performed  using an inflatable bone tamp through both cannulas. The bone tamps were removed, and vertebral augmentation was performed through both cannulas with careful application of 6 mL of firm consistency bone cement under lateral fluoroscopic guidance. The cannulas were removed and hemostasis achieved with manual compression. IMPRESSION: 1. Successful L3 Osteocool RFA ablation. 2. Successful L3 kyphoplasty. If the patient has known osteoporosis, recommend treatment as clinically indicated. If the patient's bone density status is unknown, DEXA scan is recommended. Electronically Signed   By: Miachel Roux M.D.   On: 08/26/2022 16:58   CT CHEST ABDOMEN PELVIS W CONTRAST  Result Date: 08/23/2022 CLINICAL DATA:  Uterine leiomyosarcoma, initially FIGO stage IIIC. TAHBSO 07/17/2021. Chemotherapy in progress. Restaging. * Tracking Code: BO * EXAM: CT CHEST, ABDOMEN, AND PELVIS WITH CONTRAST TECHNIQUE: Multidetector CT imaging of the chest, abdomen and pelvis was performed following the standard protocol during bolus administration of intravenous contrast. RADIATION DOSE REDUCTION: This exam was performed according to the departmental dose-optimization program which includes automated exposure control, adjustment of the mA and/or kV according to patient size and/or use of iterative reconstruction technique. CONTRAST:  170m OMNIPAQUE IOHEXOL 300 MG/ML  SOLN COMPARISON:  05/28/2022 CT chest, abdomen and pelvis. FINDINGS: CT CHEST FINDINGS Cardiovascular: Normal heart size. No significant pericardial effusion/thickening. Right internal jugular Port-A-Cath terminates at the cavoatrial junction. Great vessels are normal in course and caliber. No central pulmonary emboli. Mediastinum/Nodes: No significant thyroid nodules. Unremarkable esophagus. No pathologically enlarged axillary, mediastinal or hilar lymph nodes. Lungs/Pleura: No pneumothorax. New trace dependent bilateral pleural effusions. Medial right upper lobe solid 0.5 cm  pulmonary nodule (series 4/image 34), decreased from 0.7 cm on 05/28/2022 CT. A few additional scattered tiny bilateral solid upper lobe pulmonary nodules measuring up to 0.3 cm on the left (series 4/image 16) without appreciable interval change. No acute consolidative airspace disease or new significant pulmonary nodules. Musculoskeletal: No aggressive appearing focal osseous lesions. Minimal thoracic spondylosis. CT ABDOMEN PELVIS FINDINGS Hepatobiliary: Normal liver with no liver mass. Normal gallbladder with no radiopaque cholelithiasis. No biliary ductal dilatation. Pancreas: Normal, with no mass or duct dilation. Spleen: Normal size. No mass. Adrenals/Urinary Tract: Normal adrenals. Normal kidneys with no hydronephrosis and no renal mass. Mild generalized anterior bladder wall thickening is not appreciably changed. Otherwise normal bladder. Stomach/Bowel: Normal non-distended stomach. Normal caliber small bowel with no small bowel wall thickening. Appendix not discretely visualized. Oral contrast transits to the distal small bowel. Normal large bowel with no diverticulosis, large bowel wall thickening or pericolonic fat stranding. Vascular/Lymphatic: Atherosclerotic nonaneurysmal abdominal aorta. Patent portal, splenic, hepatic  and renal veins. Enlarged 1.4 cm right external iliac node with targetoid enhancement (series 2/image 98), significantly increased from 0.5 cm. No additional pathologically enlarged lymph nodes in the abdomen or pelvis. Reproductive: Status post hysterectomy, with no abnormal findings at the vaginal cuff. No adnexal mass. Other: No pneumoperitoneum, ascites or focal fluid collection. Musculoskeletal: Chronic incompletely healed pathologic L3 vertebral fracture with underlying lytic lesion, not appreciably changed. No new focal osseous lesions. IMPRESSION: 1. Interval growth of a solitary right external iliac nodal metastasis. No additional sites of new or progressive metastatic disease.  2. Small bilateral upper lobe pulmonary nodules are stable to mildly decreased. 3. Chronic incompletely healed pathologic L3 vertebral fracture with underlying lytic metastasis, not appreciably changed. No new focal osseous lesions. 4. New trace dependent bilateral pleural effusions. 5.  Aortic Atherosclerosis (ICD10-I70.0). Electronically Signed   By: Ilona Sorrel M.D.   On: 08/23/2022 15:41   DG BONE DENSITY (DXA)  Result Date: 08/12/2022 EXAM: DUAL X-RAY ABSORPTIOMETRY (DXA) FOR BONE MINERAL DENSITY IMPRESSION: Referring Physician:  NI [facility] Your patient completed a bone mineral density test using GE Lunar iDXA system (analysis version: 16). Technologist: ALW PATIENT: Name: Latorria, Zeoli Patient ID: [id number] Birth Date: [birth year] Height: 68.0 in. Sex: Female Measured: 08/12/2022 Weight: 131.4 lbs. Indications: Breast Cancer History, Caucasian, Chemo, Estrogen Deficiency, Hysterectomy, Post Menopausal Fractures: Treatments: Vitamin D ASSESSMENT: The BMD measured at DualFemur Total Left is 0.859 g/cm2 with a T-score of -1.2. This patient is considered to have osteopenia/low bone mass according to [facility] [place]) criteria. The can quality is good. L-3 excluded due to degenerative changes. Site Region Measured Date Measured Age YA BMD Significant CHANGE T-score DualFemur Total Left 08/12/2022    55.9         -1.2    0.859 g/cm2 AP Spine  L1-L4 (L3) 08/12/2022    55.9         1.1     1.325 g/cm2 DualFemur Total Mean 08/12/2022    55.9         -1.1    0.866 g/cm2 World Health Organization [facility]) criteria for post-menopausal, Caucasian Women: Normal       T-score at or above -1 SD Osteopenia   T-score between -1 and -2.5 SD Osteoporosis T-score at or below -2.5 SD RECOMMENDATION: 1. All patients should optimize calcium and vitamin D intake. 2. Consider FDA approved medical therapies in postmenopausal women and men aged 49 years and older, based on the following: a. A hip or vertebral (clinical or  morphometric) fracture b. T-score = -2.5 at the femoral neck or spine after appropriate evaluation to exclude secondary causes c. Low bone mass (T-score between -1.0 and -2.5 at the femoral neck or spine) and a 10- year probability of a hip fracture = 3% or a 10 year probability of a major osteoporosis-related fracture = 20% based on the [place]-adapted WHO algorithm. 3. Clinician judgement and/or patient preference may indicate treatment for people with10-year fracture probabilities above or below these levels. FOLLOW-UP: Patients with diagnosis of osteoporosis or at high risk for fracture should have regular bone mineral density tests. For patients eligible for Medicare routine testing is allowed once every 2 years. The testing frequency can be increased to one year for patients who have rapidly progressing disease, those who are receiving or discontinuing medical therapy to restore bone mass, or have additional risk factors. I have reviewed this study and agree with the findings. [facility] Electronically Signed   By: Dorothea Ogle  Litton M.D.   On: 08/12/2022 14:23   IR Radiologist Eval & Mgmt  Result Date: 08/03/2022 EXAM: NEW PATIENT OFFICE VISIT CHIEF COMPLAINT: As documented in EMR HISTORY OF PRESENT ILLNESS: The patient has a history of uterine leiomyosarcoma status post external beam radiation to the pelvis/lumbar spine, and currently undergoing chemotherapy. The patient's main complaint today is lower back pain, which she describes as stiffness and pressure/strain particularly with prolonged standing (which she defines greater than several minutes). This results in her having to sit down more frequently, and unable to perform tasks around the house and ambulate for any significant distance or period of time. She rates the pain is moderate, up to 6/10. She rates significant disability on the [employer] disability questionnaire with 14/24 positive, citing that she has to stay at home most of the  time because of her back pain, has to walk slowly and lie down more frequently, and is unable to do her normal household chores. The patient was initially diagnosed with acute pathologic fracture of the L3 vertebral body with MRI lumbar spine on February 05, 2022. Repeat imaging of the chest, abdomen and pelvis in August 2023 demonstrated persistent fracture deformity at L3 with irregularity of the vertebral body consistent with underlying tumor/posttreatment changes. REVIEW OF SYSTEMS: As documented in EMR PHYSICAL EXAMINATION: As documented in EMR ASSESSMENT AND PLAN: As documented in EMR Electronically Signed   By: Albin Felling M.D.   On: 08/03/2022 17:06      IMPRESSION: Metastatic uterine leiomyosarcoma; pulmonary and osseous metastasis (L3); now with a new solitary right external iliac nodal metastasis (November 2023)  ***  Today, I talked to the patient and family about the findings and work-up thus far.  We discussed the natural history of *** and general treatment, highlighting the role of radiotherapy in the management.  We discussed the available radiation techniques, and focused on the details of logistics and delivery.  We reviewed the anticipated acute and late sequelae associated with radiation in this setting.  The patient was encouraged to ask questions that I answered to the best of my ability. *** A patient consent form was discussed and signed.  We retained a copy for our records.  The patient would like to proceed with radiation and will be scheduled for CT simulation.  PLAN: ***    *** minutes of total time was spent for this patient encounter, including preparation, face-to-face counseling with the patient and coordination of care, physical exam, and documentation of the encounter.   ------------------------------------------------  Blair Promise, PhD, MD  This document serves as a record of services personally performed by Gery Pray, MD. It was created on his behalf by  Roney Mans, a trained medical scribe. The creation of this record is based on the scribe's personal observations and the provider's statements to them. This document has been checked and approved by the attending provider.

## 2022-08-31 ENCOUNTER — Other Ambulatory Visit: Payer: Self-pay

## 2022-08-31 ENCOUNTER — Ambulatory Visit
Admission: RE | Admit: 2022-08-31 | Discharge: 2022-08-31 | Disposition: A | Discharge status: Home / Self Care | Payer: BC Managed Care – PPO | Source: Ambulatory Visit | Attending: Radiation Oncology | Admitting: Radiation Oncology

## 2022-08-31 ENCOUNTER — Telehealth: Payer: Self-pay

## 2022-08-31 ENCOUNTER — Encounter: Payer: Self-pay | Admitting: Radiation Oncology

## 2022-08-31 VITALS — BP 122/76 | HR 86 | Temp 98.1°F | Resp 15 | Ht 68.0 in | Wt 133.9 lb

## 2022-08-31 VITALS — BP 122/76 | HR 86 | Temp 98.1°F | Resp 15 | Wt 133.4 lb

## 2022-08-31 DIAGNOSIS — C541 Malignant neoplasm of endometrium: Secondary | ICD-10-CM | POA: Insufficient documentation

## 2022-08-31 DIAGNOSIS — C7951 Secondary malignant neoplasm of bone: Secondary | ICD-10-CM | POA: Diagnosis present

## 2022-08-31 DIAGNOSIS — M85852 Other specified disorders of bone density and structure, left thigh: Secondary | ICD-10-CM | POA: Diagnosis not present

## 2022-08-31 DIAGNOSIS — E162 Hypoglycemia, unspecified: Secondary | ICD-10-CM | POA: Diagnosis not present

## 2022-08-31 DIAGNOSIS — K589 Irritable bowel syndrome without diarrhea: Secondary | ICD-10-CM | POA: Insufficient documentation

## 2022-08-31 DIAGNOSIS — Z9221 Personal history of antineoplastic chemotherapy: Secondary | ICD-10-CM | POA: Insufficient documentation

## 2022-08-31 DIAGNOSIS — C55 Malignant neoplasm of uterus, part unspecified: Secondary | ICD-10-CM

## 2022-08-31 DIAGNOSIS — Z510 Encounter for antineoplastic radiation therapy: Secondary | ICD-10-CM | POA: Diagnosis not present

## 2022-08-31 DIAGNOSIS — Z923 Personal history of irradiation: Secondary | ICD-10-CM | POA: Diagnosis not present

## 2022-08-31 DIAGNOSIS — T451X5A Adverse effect of antineoplastic and immunosuppressive drugs, initial encounter: Secondary | ICD-10-CM

## 2022-08-31 DIAGNOSIS — Z79899 Other long term (current) drug therapy: Secondary | ICD-10-CM | POA: Insufficient documentation

## 2022-08-31 DIAGNOSIS — C775 Secondary and unspecified malignant neoplasm of intrapelvic lymph nodes: Secondary | ICD-10-CM | POA: Diagnosis not present

## 2022-08-31 DIAGNOSIS — D72819 Decreased white blood cell count, unspecified: Secondary | ICD-10-CM | POA: Insufficient documentation

## 2022-08-31 MED ORDER — SODIUM CHLORIDE 0.9% FLUSH
10.0000 mL | Freq: Once | INTRAVENOUS | Status: AC
  Administered 2022-08-31: 10 mL via INTRAVENOUS

## 2022-08-31 MED ORDER — HEPARIN SOD (PORK) LOCK FLUSH 100 UNIT/ML IV SOLN
500.0000 [IU] | Freq: Once | INTRAVENOUS | Status: AC
  Administered 2022-08-31: 500 [IU] via INTRAVENOUS

## 2022-08-31 NOTE — Telephone Encounter (Signed)
Phone call to pt to follow up from her kyphoplasty on 08/26/21. Pt reports her pain is completely gone post procedure but is still having "a little soreness and some swelling bellow the insertion site if she does too much". Pt. Was advised to put ice on the area and take it easy when she feels this swelling. (Dr. Laurence Ferrari made aware). Pt reports she is able to move around a lot better. Pt denies any signs of infection, redness at the site, draining or fever. Pt has no complaints at this time and will be scheduled for a telephone follow up with Dr. Laurence Ferrari next week. Pt advised to call back if anything were to change or any concerns arise and we will arrange an in person appointment. Pt verbalized understanding.

## 2022-08-31 NOTE — Progress Notes (Signed)
Has armband been applied?  Yes.    Does patient have an allergy to IV contrast dye?: No.   Has patient ever received premedication for IV contrast dye?: No.   Date of lab work: August 23, 2022 BUN: 15 CR: 0.70 eGFR: >60  Does patient take metformin?: No.  Is eGFR >60?: Yes.   If no, when can patient resume? (Must be 48 hrs AFTER they receive IV contrast):    IV site: Port  assessed   Has IV site been added to flowsheet?  Yes.    BP 122/76   Pulse 86   Temp 98.1 F (36.7 C)   Resp 15   Ht _0  (1.727 m)   Wt 133 lb 14.4 oz (60.7 kg)   SpO2 100%   BMI 20.36 kg/m

## 2022-09-01 ENCOUNTER — Other Ambulatory Visit: Payer: Self-pay | Admitting: Interventional Radiology

## 2022-09-01 DIAGNOSIS — S32030A Wedge compression fracture of third lumbar vertebra, initial encounter for closed fracture: Secondary | ICD-10-CM

## 2022-09-07 ENCOUNTER — Inpatient Hospital Stay: Payer: BC Managed Care – PPO

## 2022-09-07 ENCOUNTER — Inpatient Hospital Stay: Payer: BC Managed Care – PPO | Admitting: Hematology and Oncology

## 2022-09-09 ENCOUNTER — Ambulatory Visit
Admission: RE | Admit: 2022-09-09 | Discharge: 2022-09-09 | Disposition: A | Discharge status: Home / Self Care | Payer: BC Managed Care – PPO | Source: Ambulatory Visit | Attending: Interventional Radiology | Admitting: Interventional Radiology

## 2022-09-09 DIAGNOSIS — S32030A Wedge compression fracture of third lumbar vertebra, initial encounter for closed fracture: Secondary | ICD-10-CM

## 2022-09-09 HISTORY — PX: IR RADIOLOGIST EVAL & MGMT: IMG5224

## 2022-09-09 NOTE — Progress Notes (Signed)
Chief Complaint: L3 Pathologic Fracture  Referring Physician(s): Alvy Bimler, Ni  History of Present Illness: Tammie Gilmore is a 56 y.o. female with history of metastatic Uterine Leiomyosarcoma had continued back pain, pressure, and stiffness related to pathologic fracture of the L3 vertebral body.  She was treated with Osteocool and Kyphoplasty on 08/26/2022.  She reports that her pain and stiffness is significantly improved.  She is able to carry out her daily life activities much more easily after the procedures.  She has had intermittent headaches for the past two days. She also develops focal swelling over her tail bone when she is particularly active for the day.  She denies fever, chills, or new back pain.  Past Medical History:  Diagnosis Date   History of radiation therapy    Lumbar Spine- 03/10/22-03/23/22- Dr. Gery Pray   Hypoglycemia    occasional episodes of hypoglycemia   IBS (irritable bowel syndrome)     Past Surgical History:  Procedure Laterality Date   HERNIA REPAIR  1994   left inguinal, with mesh   IR BONE TUMOR(S)RF ABLATION  08/26/2022   IR IMAGING GUIDED PORT INSERTION  08/13/2021   IR KYPHO LUMBAR INC FX REDUCE BONE BX UNI/BIL CANNULATION INC/IMAGING  08/26/2022   IR RADIOLOGIST EVAL & MGMT  08/03/2022    Allergies: Doxycycline  Medications: Prior to Admission medications   Medication Sig Start Date End Date Taking? Authorizing Provider  acetaminophen (TYLENOL) 500 MG tablet Take 1,000 mg by mouth every 6 (six) hours as needed for moderate pain or headache.    [provider]  calcium carbonate (TUMS - DOSED IN MG ELEMENTAL CALCIUM) 500 MG chewable tablet Chew 1 tablet by mouth 2 (two) times daily.    [provider]  cholecalciferol (VITAMIN D3) 25 MCG (1000 UNIT) tablet Take 2,000 Units by mouth daily.    [provider]  estradiol (ESTRACE) 0.1 MG/GM vaginal cream PLACE FINGER TIP SIZE AMOUNT OF CREAM AND  INSERT SLIGHTLY PAST THE VAGINAL ENTRANCE 3 TIMES DAILY Patient not taking: Reported on 08/03/2022 04/09/22   [name], MD  lidocaine (XYLOCAINE) 2 % solution SMARTSIG:By Mouth 01/04/22   [provider]  lidocaine-prilocaine (EMLA) cream Apply to affected area once 05/31/22   [name], MD  loratadine (CLARITIN) 10 MG tablet Take 10 mg by mouth daily as needed (for bone aches).    [provider]  LORazepam (ATIVAN) 0.5 MG tablet Take 1 tablet (0.5 mg total) by mouth 2 (two) times daily as needed for anxiety. Patient not taking: Reported on 04/26/2022 12/14/21   [name], MD  magic mouthwash (nystatin, diphenhydrAMINE, alum & mag hydroxide) suspension mixture Swish and spit 5 mLs 4 (four) times daily as needed for mouth pain. 08/10/22   [name], MD  metoprolol succinate (TOPROL XL) 25 MG 24 hr tablet Take 1 tablet (25 mg total) by mouth at bedtime. 01/19/22   Bensimhon, Shaune Pascal, MD  ondansetron (ZOFRAN) 8 MG tablet Take 1 tablet (8 mg total) by mouth every 8 (eight) hours as needed. Patient not taking: Reported on 08/03/2022 08/17/21   [name], MD  prochlorperazine (COMPAZINE) 10 MG tablet Take 1 tablet (10 mg total) by mouth every 6 (six) hours as needed (Nausea or vomiting). 09/29/21   [name], MD  senna (SENOKOT) 8.6 MG TABS tablet Take 2 tablets by mouth at bedtime.    [provider]     Family History  Problem Relation Age of Onset  Heart attack Mother    Heart disease Mother    Endometriosis Mother    Heart disease Father    Heart attack Father    Cancer - Colon Neg Hx    Breast cancer Neg Hx    Cancer Neg Hx    Ovarian cancer Neg Hx    Uterine cancer Neg Hx    Pancreatic cancer Neg Hx    Pancreatic disease Neg Hx    Prostate cancer Neg Hx     Social History   Socioeconomic History   Marital status: Married    Spouse name: Not on file   Number of children: 3   Years of education: Not on file   Highest education level: Not on file   Occupational History   Occupation: accountant  Tobacco Use   Smoking status: Never   Smokeless tobacco: Never  Vaping Use   Vaping Use: Never used  Substance and Sexual Activity   Alcohol use: Never   Drug use: Never   Sexual activity: Not Currently  Other Topics Concern   Not on file  Social History Narrative   Not on file   Social Determinants of Health   Financial Resource Strain: Not on file  Food Insecurity: Not on file  Transportation Needs: Not on file  Physical Activity: Not on file  Stress: Not on file  Social Connections: Not on file   Review of Systems  Review of Systems: A 12 point ROS discussed and pertinent positives are indicated in the HPI above.  All other systems are negative.  Physical Exam No direct physical exam was performed (except for noted visual exam findings with Video Visits).   Vital Signs: There were no vitals taken for this visit.  Imaging: IR KYPHO LUMBAR INC FX REDUCE BONE BX UNI/BIL CANNULATION INC/IMAGING  Result Date: 08/26/2022 INDICATION: 56 year old woman with history of uterine leiomyosarcoma with lytic metastasis to the L3 vertebral body resulting in pathologic fracture and intractable back pain presents to IR for RF ablation and kyphoplasty. EXAM: 1. L3 Osteocool RF ablation 2. L3 kyphoplasty COMPARISON:  None Available. MEDICATIONS: As antibiotic prophylaxis, Ancef 2 g IV was ordered pre-procedure and administered intravenously within 1 hour of incision. All current medications are in the EMR and have been reviewed as part of this encounter. ANESTHESIA/SEDATION: Moderate (conscious) sedation was employed during this procedure. A total of Versed 4.5 mg and Fentanyl 225 mcg was administered intravenously by the radiology nurse. Total intra-service moderate Sedation Time: 68 minutes. The patient's level of consciousness and vital signs were monitored continuously by radiology nursing throughout the procedure under my direct supervision.  FLUOROSCOPY: Radiation Exposure Index (as provided by the fluoroscopic device): 75 mGy Kerma COMPLICATIONS: None immediate. PROCEDURE: Following a full explanation of the procedure along with the potential associated complications, an informed witnessed consent was obtained. The patient was positioned prone on the exam table. Bipedicular access was planned. Skin entry site(s) were marked overlying the L3 vertebral body using fluoroscopy. The overlying skin was then prepped and draped in the standard sterile fashion. Local analgesia was obtained with 1% lidocaine. Attention was first turned to the right side. Under fluoroscopic guidance, a 10 gauge introducer needle was advanced towards the lateral margin of the pedicle. Using multiple projections, the introducer needle was advanced towards the posterior margin of the vertebral body via a transpedicular approach. The posterior margin of the ablation zone was then marked using the inner needle of the introducer. The inner needle was then removed, and  the marking drill was advanced towards the anterior margin of the vertebral body for RF probe selection. The 10 mm RF ablation probe was determined to be appropriate. Attention was then turned to the contralateral side. Under fluoroscopic guidance, a 10 gauge introducer needle was advanced towards the lateral margin of the pedicle. Using multiple projections, the introducer needle was advanced towards the posterior margin of the vertebral body via a transpedicular approach. Again, the posterior margin of the ablation zone was then marked using the inner needle of the introducer. The marking drill was then advanced towards the anterior margin of the vertebral body for RF probe selection. The 10 mm RF ablation probe was determined to be appropriate. The selected RF ablation probes were then advanced through the bilateral transpedicular access needles and locked into position. Appropriate location within the vertebral body  was confirmed with multiple projections. RF ablation was then performed for 7 minutes and 30 seconds the patient tolerated this portion of the procedure well without significant discomfort. The RF probes were then removed. Partial fracture reduction and bony cavity creation was then performed using an inflatable bone tamp through both cannulas. The bone tamps were removed, and vertebral augmentation was performed through both cannulas with careful application of 6 mL of firm consistency bone cement under lateral fluoroscopic guidance. The cannulas were removed and hemostasis achieved with manual compression. IMPRESSION: 1. Successful L3 Osteocool RFA ablation. 2. Successful L3 kyphoplasty. If the patient has known osteoporosis, recommend treatment as clinically indicated. If the patient's bone density status is unknown, DEXA scan is recommended. Electronically Signed   By: Miachel Roux M.D.   On: 08/26/2022 16:58   IR Bone Tumor(s)RF Ablation  Result Date: 08/26/2022 INDICATION: 56 year old woman with history of uterine leiomyosarcoma with lytic metastasis to the L3 vertebral body resulting in pathologic fracture and intractable back pain presents to IR for RF ablation and kyphoplasty. EXAM: 1. L3 Osteocool RF ablation 2. L3 kyphoplasty COMPARISON:  None Available. MEDICATIONS: As antibiotic prophylaxis, Ancef 2 g IV was ordered pre-procedure and administered intravenously within 1 hour of incision. All current medications are in the EMR and have been reviewed as part of this encounter. ANESTHESIA/SEDATION: Moderate (conscious) sedation was employed during this procedure. A total of Versed 4.5 mg and Fentanyl 225 mcg was administered intravenously by the radiology nurse. Total intra-service moderate Sedation Time: 68 minutes. The patient's level of consciousness and vital signs were monitored continuously by radiology nursing throughout the procedure under my direct supervision. FLUOROSCOPY: Radiation Exposure  Index (as provided by the fluoroscopic device): 75 mGy Kerma COMPLICATIONS: None immediate. PROCEDURE: Following a full explanation of the procedure along with the potential associated complications, an informed witnessed consent was obtained. The patient was positioned prone on the exam table. Bipedicular access was planned. Skin entry site(s) were marked overlying the L3 vertebral body using fluoroscopy. The overlying skin was then prepped and draped in the standard sterile fashion. Local analgesia was obtained with 1% lidocaine. Attention was first turned to the right side. Under fluoroscopic guidance, a 10 gauge introducer needle was advanced towards the lateral margin of the pedicle. Using multiple projections, the introducer needle was advanced towards the posterior margin of the vertebral body via a transpedicular approach. The posterior margin of the ablation zone was then marked using the inner needle of the introducer. The inner needle was then removed, and the marking drill was advanced towards the anterior margin of the vertebral body for RF probe selection. The 10 mm RF ablation  probe was determined to be appropriate. Attention was then turned to the contralateral side. Under fluoroscopic guidance, a 10 gauge introducer needle was advanced towards the lateral margin of the pedicle. Using multiple projections, the introducer needle was advanced towards the posterior margin of the vertebral body via a transpedicular approach. Again, the posterior margin of the ablation zone was then marked using the inner needle of the introducer. The marking drill was then advanced towards the anterior margin of the vertebral body for RF probe selection. The 10 mm RF ablation probe was determined to be appropriate. The selected RF ablation probes were then advanced through the bilateral transpedicular access needles and locked into position. Appropriate location within the vertebral body was confirmed with multiple  projections. RF ablation was then performed for 7 minutes and 30 seconds the patient tolerated this portion of the procedure well without significant discomfort. The RF probes were then removed. Partial fracture reduction and bony cavity creation was then performed using an inflatable bone tamp through both cannulas. The bone tamps were removed, and vertebral augmentation was performed through both cannulas with careful application of 6 mL of firm consistency bone cement under lateral fluoroscopic guidance. The cannulas were removed and hemostasis achieved with manual compression. IMPRESSION: 1. Successful L3 Osteocool RFA ablation. 2. Successful L3 kyphoplasty. If the patient has known osteoporosis, recommend treatment as clinically indicated. If the patient's bone density status is unknown, DEXA scan is recommended. Electronically Signed   By: Miachel Roux M.D.   On: 08/26/2022 16:58   CT CHEST ABDOMEN PELVIS W CONTRAST  Result Date: 08/23/2022 CLINICAL DATA:  Uterine leiomyosarcoma, initially FIGO stage IIIC. TAHBSO 07/17/2021. Chemotherapy in progress. Restaging. * Tracking Code: BO * EXAM: CT CHEST, ABDOMEN, AND PELVIS WITH CONTRAST TECHNIQUE: Multidetector CT imaging of the chest, abdomen and pelvis was performed following the standard protocol during bolus administration of intravenous contrast. RADIATION DOSE REDUCTION: This exam was performed according to the departmental dose-optimization program which includes automated exposure control, adjustment of the mA and/or kV according to patient size and/or use of iterative reconstruction technique. CONTRAST:  134m OMNIPAQUE IOHEXOL 300 MG/ML  SOLN COMPARISON:  05/28/2022 CT chest, abdomen and pelvis. FINDINGS: CT CHEST FINDINGS Cardiovascular: Normal heart size. No significant pericardial effusion/thickening. Right internal jugular Port-A-Cath terminates at the cavoatrial junction. Great vessels are normal in course and caliber. No central pulmonary  emboli. Mediastinum/Nodes: No significant thyroid nodules. Unremarkable esophagus. No pathologically enlarged axillary, mediastinal or hilar lymph nodes. Lungs/Pleura: No pneumothorax. New trace dependent bilateral pleural effusions. Medial right upper lobe solid 0.5 cm pulmonary nodule (series 4/image 34), decreased from 0.7 cm on 05/28/2022 CT. A few additional scattered tiny bilateral solid upper lobe pulmonary nodules measuring up to 0.3 cm on the left (series 4/image 16) without appreciable interval change. No acute consolidative airspace disease or new significant pulmonary nodules. Musculoskeletal: No aggressive appearing focal osseous lesions. Minimal thoracic spondylosis. CT ABDOMEN PELVIS FINDINGS Hepatobiliary: Normal liver with no liver mass. Normal gallbladder with no radiopaque cholelithiasis. No biliary ductal dilatation. Pancreas: Normal, with no mass or duct dilation. Spleen: Normal size. No mass. Adrenals/Urinary Tract: Normal adrenals. Normal kidneys with no hydronephrosis and no renal mass. Mild generalized anterior bladder wall thickening is not appreciably changed. Otherwise normal bladder. Stomach/Bowel: Normal non-distended stomach. Normal caliber small bowel with no small bowel wall thickening. Appendix not discretely visualized. Oral contrast transits to the distal small bowel. Normal large bowel with no diverticulosis, large bowel wall thickening or pericolonic fat stranding. Vascular/Lymphatic: Atherosclerotic  nonaneurysmal abdominal aorta. Patent portal, splenic, hepatic and renal veins. Enlarged 1.4 cm right external iliac node with targetoid enhancement (series 2/image 98), significantly increased from 0.5 cm. No additional pathologically enlarged lymph nodes in the abdomen or pelvis. Reproductive: Status post hysterectomy, with no abnormal findings at the vaginal cuff. No adnexal mass. Other: No pneumoperitoneum, ascites or focal fluid collection. Musculoskeletal: Chronic incompletely  healed pathologic L3 vertebral fracture with underlying lytic lesion, not appreciably changed. No new focal osseous lesions. IMPRESSION: 1. Interval growth of a solitary right external iliac nodal metastasis. No additional sites of new or progressive metastatic disease. 2. Small bilateral upper lobe pulmonary nodules are stable to mildly decreased. 3. Chronic incompletely healed pathologic L3 vertebral fracture with underlying lytic metastasis, not appreciably changed. No new focal osseous lesions. 4. New trace dependent bilateral pleural effusions. 5.  Aortic Atherosclerosis (ICD10-I70.0). Electronically Signed   By: Ilona Sorrel M.D.   On: 08/23/2022 15:41   DG BONE DENSITY (DXA)  Result Date: 08/12/2022 EXAM: DUAL X-RAY ABSORPTIOMETRY (DXA) FOR BONE MINERAL DENSITY IMPRESSION: Referring Physician:  NI [facility] Your patient completed a bone mineral density test using GE Lunar iDXA system (analysis version: 16). Technologist: ALW PATIENT: Name: Charleigh, Correnti Patient ID: [id number] Birth Date: Sep 19, 1966 Height: 68.0 in. Sex: Female Measured: 08/12/2022 Weight: 131.4 lbs. Indications: Breast Cancer History, Caucasian, Chemo, Estrogen Deficiency, Hysterectomy, Post Menopausal Fractures: Treatments: Vitamin D ASSESSMENT: The BMD measured at DualFemur Total Left is 0.859 g/cm2 with a T-score of -1.2. This patient is considered to have osteopenia/low bone mass according to [facility]) criteria. The can quality is good. L-3 excluded due to degenerative changes. Site Region Measured Date Measured Age YA BMD Significant CHANGE T-score DualFemur Total Left 08/12/2022    55.9         -1.2    0.859 g/cm2 AP Spine  L1-L4 (L3) 08/12/2022    55.9         1.1     1.325 g/cm2 DualFemur Total Mean 08/12/2022    55.9         -1.1    0.866 g/cm2 World Health Organization [facility]) criteria for post-menopausal, Caucasian Women: Normal       T-score at or above -1 SD Osteopenia   T-score between -1 and -2.5 SD  Osteoporosis T-score at or below -2.5 SD RECOMMENDATION: 1. All patients should optimize calcium and vitamin D intake. 2. Consider FDA approved medical therapies in postmenopausal women and men aged 58 years and older, based on the following: a. A hip or vertebral (clinical or morphometric) fracture b. T-score = -2.5 at the femoral neck or spine after appropriate evaluation to exclude secondary causes c. Low bone mass (T-score between -1.0 and -2.5 at the femoral neck or spine) and a 10- year probability of a hip fracture = 3% or a 10 year probability of a major osteoporosis-related fracture = 20% based on the [place]-adapted WHO algorithm. 3. Clinician judgement and/or patient preference may indicate treatment for people with10-year fracture probabilities above or below these levels. FOLLOW-UP: Patients with diagnosis of osteoporosis or at high risk for fracture should have regular bone mineral density tests. For patients eligible for Medicare routine testing is allowed once every 2 years. The testing frequency can be increased to one year for patients who have rapidly progressing disease, those who are receiving or discontinuing medical therapy to restore bone mass, or have additional risk factors. I have reviewed this study and agree with the findings. [facility] Electronically Signed   By: Zerita Boers M.D.   On: 08/12/2022 14:23    Labs:  CBC: Recent Labs    07/13/22 1050 07/27/22 1119 08/10/22 1030 08/23/22 1103  WBC 5.2 15.7* 4.5 19.6*  HGB 11.1* 12.2 9.9* 11.3*  HCT 33.5* 38.0 30.5* 36.6  PLT 225 220 313 142*    COAGS: No results for input(s): "INR", "APTT" in the last 8760 hours.  BMP: Recent Labs    07/13/22 1050 07/27/22 1119 08/10/22 1030 08/23/22 1103  NA 139 141 141 140  K 4.0 4.1 3.7 4.0  CL 108 106 107 105  CO2 '26 31 29 30  '$ GLUCOSE 235* 112* 114* 91  BUN '14 14 13 15  '$ CALCIUM 8.5* 8.9 8.5* 8.8*  CREATININE 0.59 0.64 0.52 0.70  GFRNONAA >60 >60 >60 >60     LIVER FUNCTION TESTS: Recent Labs    07/13/22 1050 07/27/22 1119 08/10/22 1030 08/23/22 1103  BILITOT 0.3 0.3 0.3 0.3  AST 12* 12* 9* 11*  ALT '12 8 6 10  '$ ALKPHOS 66 110 71 123  PROT 5.4* 6.0* 5.8* 6.2*  ALBUMIN 3.5 3.5 3.1* 3.7    TUMOR MARKERS: No results for input(s): "AFPTM", "CEA", "CA199", "CHROMGRNA" in the last 8760 hours.  Assessment and Plan:  56 year old woman with history of pathologic compression fracture of L3 due to metastatic lesion from uterine leiomyosarcoma resulting in intractable back pain, pressure, and stiffness.  She was treated on 08/26/2022 with Osteocool and kyphoplasty.  Her symptoms have significantly improved since that time.    She does complain of new onset headaches and intermittent localized swelling over her tailbone with increased activity.  I do not believe that these symptoms are related to the [facility] or kyphoplasty.  She has a follow-up appointment with her Oncologist next week.  I encouraged her to discuss the symptoms with her Oncologist if they persist.   Thank you for this interesting consult.  I greatly enjoyed meeting Nicey Krah Salois and look forward to participating in their care.  A copy of this report was sent to the requesting provider on this date.  Electronically Signed: Paula Libra Tila Millirons 09/09/2022, 2:48 PM   I spent a total of 15 Minutes in remote  clinical consultation, greater than 50% of which was counseling/coordinating care for L3 Pathologic Fracture.    Visit type: Audio only (telephone). Audio (no video) only due to technical limitations. Alternative for in-person consultation at [facility], [address] [place], [place], [place]. This visit type was conducted due to national recommendations for restrictions regarding the COVID-19 Pandemic (e.g. social distancing).  This format is felt to be most appropriate for this patient at this time.  All issues noted in this document were discussed and addressed.

## 2022-09-13 DIAGNOSIS — C7951 Secondary malignant neoplasm of bone: Secondary | ICD-10-CM | POA: Insufficient documentation

## 2022-09-13 DIAGNOSIS — Z510 Encounter for antineoplastic radiation therapy: Secondary | ICD-10-CM | POA: Diagnosis not present

## 2022-09-13 DIAGNOSIS — C55 Malignant neoplasm of uterus, part unspecified: Secondary | ICD-10-CM | POA: Diagnosis not present

## 2022-09-13 DIAGNOSIS — C775 Secondary and unspecified malignant neoplasm of intrapelvic lymph nodes: Secondary | ICD-10-CM | POA: Diagnosis present

## 2022-09-14 ENCOUNTER — Other Ambulatory Visit: Payer: Self-pay

## 2022-09-14 ENCOUNTER — Ambulatory Visit
Admission: RE | Admit: 2022-09-14 | Discharge: 2022-09-14 | Disposition: A | Discharge status: Home / Self Care | Payer: BC Managed Care – PPO | Source: Ambulatory Visit | Attending: Radiation Oncology | Admitting: Radiation Oncology

## 2022-09-14 DIAGNOSIS — C55 Malignant neoplasm of uterus, part unspecified: Secondary | ICD-10-CM

## 2022-09-14 DIAGNOSIS — C775 Secondary and unspecified malignant neoplasm of intrapelvic lymph nodes: Secondary | ICD-10-CM | POA: Diagnosis not present

## 2022-09-14 LAB — RAD ONC ARIA SESSION SUMMARY
Course Elapsed Days: 0
Plan Fractions Treated to Date: 1
Plan Prescribed Dose Per Fraction: 10 Gy
Plan Total Fractions Prescribed: 5
Plan Total Prescribed Dose: 50 Gy
Reference Point Dosage Given to Date: 10 Gy
Reference Point Session Dosage Given: 10 Gy
Session Number: 1

## 2022-09-15 ENCOUNTER — Ambulatory Visit: Payer: BC Managed Care – PPO | Admitting: Radiation Oncology

## 2022-09-16 ENCOUNTER — Ambulatory Visit
Admission: RE | Admit: 2022-09-16 | Discharge: 2022-09-16 | Disposition: A | Discharge status: Home / Self Care | Payer: BC Managed Care – PPO | Source: Ambulatory Visit | Attending: Radiation Oncology | Admitting: Radiation Oncology

## 2022-09-16 ENCOUNTER — Other Ambulatory Visit: Payer: Self-pay

## 2022-09-16 DIAGNOSIS — C55 Malignant neoplasm of uterus, part unspecified: Secondary | ICD-10-CM

## 2022-09-16 DIAGNOSIS — C775 Secondary and unspecified malignant neoplasm of intrapelvic lymph nodes: Secondary | ICD-10-CM | POA: Diagnosis not present

## 2022-09-16 LAB — RAD ONC ARIA SESSION SUMMARY
Course Elapsed Days: 2
Plan Fractions Treated to Date: 2
Plan Prescribed Dose Per Fraction: 10 Gy
Plan Total Fractions Prescribed: 5
Plan Total Prescribed Dose: 50 Gy
Reference Point Dosage Given to Date: 20 Gy
Reference Point Session Dosage Given: 10 Gy
Session Number: 2

## 2022-09-17 ENCOUNTER — Ambulatory Visit: Payer: BC Managed Care – PPO | Admitting: Radiation Oncology

## 2022-09-20 ENCOUNTER — Ambulatory Visit
Admission: RE | Admit: 2022-09-20 | Discharge: 2022-09-20 | Disposition: A | Discharge status: Home / Self Care | Payer: BC Managed Care – PPO | Source: Ambulatory Visit | Attending: Radiation Oncology | Admitting: Radiation Oncology

## 2022-09-20 ENCOUNTER — Other Ambulatory Visit: Payer: Self-pay

## 2022-09-20 DIAGNOSIS — C775 Secondary and unspecified malignant neoplasm of intrapelvic lymph nodes: Secondary | ICD-10-CM | POA: Diagnosis not present

## 2022-09-20 DIAGNOSIS — C55 Malignant neoplasm of uterus, part unspecified: Secondary | ICD-10-CM

## 2022-09-20 LAB — RAD ONC ARIA SESSION SUMMARY
Course Elapsed Days: 6
Plan Fractions Treated to Date: 3
Plan Prescribed Dose Per Fraction: 10 Gy
Plan Total Fractions Prescribed: 5
Plan Total Prescribed Dose: 50 Gy
Reference Point Dosage Given to Date: 30 Gy
Reference Point Session Dosage Given: 10 Gy
Session Number: 3

## 2022-09-22 ENCOUNTER — Other Ambulatory Visit: Payer: Self-pay

## 2022-09-22 ENCOUNTER — Ambulatory Visit
Admission: RE | Admit: 2022-09-22 | Discharge: 2022-09-22 | Disposition: A | Discharge status: Home / Self Care | Payer: BC Managed Care – PPO | Source: Ambulatory Visit | Attending: Radiation Oncology | Admitting: Radiation Oncology

## 2022-09-22 DIAGNOSIS — C775 Secondary and unspecified malignant neoplasm of intrapelvic lymph nodes: Secondary | ICD-10-CM | POA: Diagnosis not present

## 2022-09-22 DIAGNOSIS — C7951 Secondary malignant neoplasm of bone: Secondary | ICD-10-CM

## 2022-09-22 LAB — RAD ONC ARIA SESSION SUMMARY
Course Elapsed Days: 8
Plan Fractions Treated to Date: 4
Plan Prescribed Dose Per Fraction: 10 Gy
Plan Total Fractions Prescribed: 5
Plan Total Prescribed Dose: 50 Gy
Reference Point Dosage Given to Date: 40 Gy
Reference Point Session Dosage Given: 10 Gy
Session Number: 4

## 2022-09-24 ENCOUNTER — Ambulatory Visit
Admission: RE | Admit: 2022-09-24 | Discharge: 2022-09-24 | Disposition: A | Discharge status: Home / Self Care | Payer: BC Managed Care – PPO | Source: Ambulatory Visit | Attending: Radiation Oncology | Admitting: Radiation Oncology

## 2022-09-24 ENCOUNTER — Other Ambulatory Visit: Payer: Self-pay

## 2022-09-24 DIAGNOSIS — C775 Secondary and unspecified malignant neoplasm of intrapelvic lymph nodes: Secondary | ICD-10-CM | POA: Diagnosis not present

## 2022-09-24 DIAGNOSIS — C55 Malignant neoplasm of uterus, part unspecified: Secondary | ICD-10-CM

## 2022-09-24 LAB — RAD ONC ARIA SESSION SUMMARY
Course Elapsed Days: 10
Plan Fractions Treated to Date: 5
Plan Prescribed Dose Per Fraction: 10 Gy
Plan Total Fractions Prescribed: 5
Plan Total Prescribed Dose: 50 Gy
Reference Point Dosage Given to Date: 50 Gy
Reference Point Session Dosage Given: 10 Gy
Session Number: 5

## 2022-09-28 ENCOUNTER — Other Ambulatory Visit: Payer: Self-pay

## 2022-09-28 ENCOUNTER — Inpatient Hospital Stay (HOSPITAL_BASED_OUTPATIENT_CLINIC_OR_DEPARTMENT_OTHER): Payer: BC Managed Care – PPO | Admitting: Hematology and Oncology

## 2022-09-28 ENCOUNTER — Encounter: Payer: Self-pay | Admitting: Hematology and Oncology

## 2022-09-28 ENCOUNTER — Inpatient Hospital Stay: Payer: BC Managed Care – PPO | Attending: Gynecologic Oncology

## 2022-09-28 VITALS — BP 120/69 | HR 92 | Temp 98.8°F | Resp 18 | Ht 68.0 in | Wt 130.2 lb

## 2022-09-28 DIAGNOSIS — R5381 Other malaise: Secondary | ICD-10-CM | POA: Diagnosis not present

## 2022-09-28 DIAGNOSIS — Z923 Personal history of irradiation: Secondary | ICD-10-CM | POA: Insufficient documentation

## 2022-09-28 DIAGNOSIS — D61818 Other pancytopenia: Secondary | ICD-10-CM | POA: Diagnosis not present

## 2022-09-28 DIAGNOSIS — C7951 Secondary malignant neoplasm of bone: Secondary | ICD-10-CM | POA: Diagnosis not present

## 2022-09-28 DIAGNOSIS — M8448XA Pathological fracture, other site, initial encounter for fracture: Secondary | ICD-10-CM | POA: Diagnosis not present

## 2022-09-28 DIAGNOSIS — C7800 Secondary malignant neoplasm of unspecified lung: Secondary | ICD-10-CM | POA: Diagnosis not present

## 2022-09-28 DIAGNOSIS — C55 Malignant neoplasm of uterus, part unspecified: Secondary | ICD-10-CM | POA: Diagnosis present

## 2022-09-28 DIAGNOSIS — M899 Disorder of bone, unspecified: Secondary | ICD-10-CM | POA: Diagnosis not present

## 2022-09-28 LAB — CBC WITH DIFFERENTIAL/PLATELET
Abs Immature Granulocytes: 0.01 10*3/uL (ref 0.00–0.07)
Basophils Absolute: 0 10*3/uL (ref 0.0–0.1)
Basophils Relative: 1 %
Eosinophils Absolute: 0.2 10*3/uL (ref 0.0–0.5)
Eosinophils Relative: 4 %
HCT: 38.2 % (ref 36.0–46.0)
Hemoglobin: 12.7 g/dL (ref 12.0–15.0)
Immature Granulocytes: 0 %
Lymphocytes Relative: 15 %
Lymphs Abs: 0.6 10*3/uL — ABNORMAL LOW (ref 0.7–4.0)
MCH: 32 pg (ref 26.0–34.0)
MCHC: 33.2 g/dL (ref 30.0–36.0)
MCV: 96.2 fL (ref 80.0–100.0)
Monocytes Absolute: 0.3 10*3/uL (ref 0.1–1.0)
Monocytes Relative: 8 %
Neutro Abs: 2.8 10*3/uL (ref 1.7–7.7)
Neutrophils Relative %: 72 %
Platelets: 165 10*3/uL (ref 150–400)
RBC: 3.97 MIL/uL (ref 3.87–5.11)
RDW: 13.4 % (ref 11.5–15.5)
WBC: 3.9 10*3/uL — ABNORMAL LOW (ref 4.0–10.5)
nRBC: 0 % (ref 0.0–0.2)

## 2022-09-28 LAB — COMPREHENSIVE METABOLIC PANEL
ALT: 17 U/L (ref 0–44)
AST: 19 U/L (ref 15–41)
Albumin: 3.7 g/dL (ref 3.5–5.0)
Alkaline Phosphatase: 65 U/L (ref 38–126)
Anion gap: 7 (ref 5–15)
BUN: 14 mg/dL (ref 6–20)
CO2: 26 mmol/L (ref 22–32)
Calcium: 8.9 mg/dL (ref 8.9–10.3)
Chloride: 109 mmol/L (ref 98–111)
Creatinine, Ser: 0.62 mg/dL (ref 0.44–1.00)
GFR, Estimated: >60 mL/min (ref >60)
Glucose, Bld: 135 mg/dL — ABNORMAL HIGH (ref 70–99)
Potassium: 3.7 mmol/L (ref 3.5–5.1)
Sodium: 142 mmol/L (ref 135–145)
Total Bilirubin: 0.4 mg/dL (ref 0.3–1.2)
Total Protein: 6.3 g/dL — ABNORMAL LOW (ref 6.5–8.1)

## 2022-09-28 MED ORDER — METOPROLOL SUCCINATE ER 25 MG PO TB24: 25.0000 mg | ORAL_TABLET | Freq: Every day | ORAL | 6 refills | Status: DC

## 2022-09-28 MED ORDER — HEPARIN SOD (PORK) LOCK FLUSH 100 UNIT/ML IV SOLN
500.0000 [IU] | Freq: Once | INTRAVENOUS | Status: AC
  Administered 2022-09-28: 500 [IU]

## 2022-09-28 MED ORDER — SODIUM CHLORIDE 0.9% FLUSH
10.0000 mL | Freq: Once | INTRAVENOUS | Status: AC
  Administered 2022-09-28: 10 mL

## 2022-09-28 NOTE — Progress Notes (Signed)
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 and kyphoplasty I plan to repeat imaging study in February for objective assessment of response to therapy Her pancytopenia is resolving I recommend physical therapy and rehab  Pathologic fracture of lumbar vertebra Overall, she is doing better Recommend physical therapy and rehab She is not ready for Zometa; she is still working with the dentist to get dental clearance  Physical debility She has weakness and deconditioning I recommend PT I have completed application to help her get disability parking placard  Skull lesion She has a palpable bone lesion at the back of her skull It is not clear what caused it I reviewed her imaging study of her head from March which show irregular bone island I plan to repeat CT imaging of her head without contrast for further assessment  Orders Placed This Encounter  Procedures   CT CHEST ABDOMEN PELVIS W CONTRAST    Standing Status:   Future    Standing Expiration Date:   09/29/2023    Order Specific Question:   Preferred imaging location?    Answer:   [facility]    Order Specific Question:   Radiology Contrast Protocol - do NOT remove file path    Answer:   [email or web address]    Order Specific Question:   Is patient pregnant?    Answer:   No   CT HEAD WO CONTRAST (5MM)    Standing Status:   Future    Standing Expiration Date:   09/29/2023    Order Specific Question:   Is patient pregnant?    Answer:   No    Order Specific Question:   Preferred imaging location?    Answer:   [facility]   Ambulatory referral to Physical Therapy    Referral Priority:   Routine    Referral Type:   Physical Medicine    Referral Reason:   Specialty Services Required    Requested Specialty:    Physical Therapy    Number of Visits Requested:   1    All questions were answered. The patient knows to call the clinic with any problems, questions or concerns. The total time spent in the appointment was 30 minutes encounter with patients including review of chart and various tests results, discussions about plan of care and coordination of care plan   [name], MD 09/28/2022 2:56 PM  INTERVAL HISTORY: Please see below for problem oriented charting. she returns for treatment follow-up with her husband Her energy level is improving She has some mild sciatica pain She is still somewhat weak from treatment She is working with a dentist to get dental clearance She noted a lesion at the back of her head that is uncomfortable when she tries to sleep  REVIEW OF SYSTEMS:   Constitutional: Denies fevers, chills or abnormal weight loss Eyes: Denies blurriness of vision Ears, nose, mouth, throat, and face: Denies mucositis or sore throat Respiratory: Denies cough, dyspnea or wheezes Cardiovascular: Denies palpitation, chest discomfort or lower extremity swelling Gastrointestinal:  Denies nausea, heartburn or change in bowel habits Skin: Denies abnormal skin rashes Lymphatics: Denies new lymphadenopathy or easy bruising Behavioral/Psych: Mood is stable, no new changes  All other systems were reviewed with the patient and are negative.  I have reviewed the past medical history, past surgical history, social history and  family history with the patient and they are unchanged from previous note.  ALLERGIES:  is allergic to doxycycline.  MEDICATIONS:  Current Outpatient Medications  Medication Sig Dispense Refill   acetaminophen (TYLENOL) 500 MG tablet Take 1,000 mg by mouth every 6 (six) hours as needed for moderate pain or headache.     calcium carbonate (TUMS - DOSED IN MG ELEMENTAL CALCIUM) 500 MG chewable tablet Chew 1 tablet by mouth 2 (two) times daily.     cholecalciferol  (VITAMIN D3) 25 MCG (1000 UNIT) tablet Take 2,000 Units by mouth daily.     estradiol (ESTRACE) 0.1 MG/GM vaginal cream PLACE FINGER TIP SIZE AMOUNT OF CREAM AND INSERT SLIGHTLY PAST THE VAGINAL ENTRANCE 3 TIMES DAILY (Patient not taking: Reported on 08/03/2022) 126 g 4   lidocaine (XYLOCAINE) 2 % solution SMARTSIG:By Mouth     lidocaine-prilocaine (EMLA) cream Apply to affected area once 30 g 3   loratadine (CLARITIN) 10 MG tablet Take 10 mg by mouth daily as needed (for bone aches).     LORazepam (ATIVAN) 0.5 MG tablet Take 1 tablet (0.5 mg total) by mouth 2 (two) times daily as needed for anxiety. (Patient not taking: Reported on 04/26/2022) 30 tablet 0   magic mouthwash (nystatin, diphenhydrAMINE, alum & mag hydroxide) suspension mixture Swish and spit 5 mLs 4 (four) times daily as needed for mouth pain. 240 mL 0   metoprolol succinate (TOPROL XL) 25 MG 24 hr tablet Take 1 tablet (25 mg total) by mouth at bedtime. 30 tablet 6   ondansetron (ZOFRAN) 8 MG tablet Take 1 tablet (8 mg total) by mouth every 8 (eight) hours as needed. (Patient not taking: Reported on 08/03/2022) 30 tablet 1   prochlorperazine (COMPAZINE) 10 MG tablet Take 1 tablet (10 mg total) by mouth every 6 (six) hours as needed (Nausea or vomiting). 90 tablet 1   senna (SENOKOT) 8.6 MG TABS tablet Take 2 tablets by mouth at bedtime.     No current facility-administered medications for this visit.    SUMMARY OF ONCOLOGIC HISTORY: Oncology History  Uterine leiomyosarcoma ([place])  06/11/2021 Imaging   1. 9.5 x 7.6 x 9.0 cm complex, partially necrotic, mass involving the lower uterine segment/ cervix. No obvious direct extension into the parametrium.  2. 9 mm left pelvic sidewall lymph node is partially necrotic and worrisome for metastatic adenopathy.  3. No findings for abdominal omental or peritoneal surface disease or adenopathy.  4. Tiny low-attenuation lesion in the pancreatic head, likely benign cyst but attention on follow-up  scans is suggested.  5. 2.9 cm fundal fibroid.    06/19/2021 Pathology Results   FINAL MICROSCOPIC DIAGNOSIS:   A. UTERINE, CERVICAL MASS, BIOPSY:  - Spindle cell malignancy.  - See comment.   COMMENT:  The biopsies consist of endocervical mucosa with stromal edema and one biopsy fragment has a microscopic focus with atypical spindle cells consistent with poorly differentiated malignancy.  The differential  includes a spindle cell malignancy such as sarcomatoid carcinoma and leiomyosarcoma.  Mullerian adenosarcoma is also a consideration but considered less likely   06/23/2021 Imaging   MR pelvis  10 cm uterine mass with central necrosis, which is centered in the cervix and lower uterine segment. Right parametrial involvement is seen as well as suspected invasion of the distal rectum. Differential diagnosis includes cervical carcinoma and uterine leiomyosarcoma.   Mild bilateral iliac lymphadenopathy, highly suspicious for metastatic disease.   2.9 cm subserosal fibroid in the posterior fundus.   Normal appearance  of both ovaries.     06/29/2021 PET scan   1. Hypermetabolic necrotic cervical/uterine mass with bilateral external iliac hypermetabolic lymph nodes. No evidence of distant metastatic disease. 2. 1.5 cm low-attenuation left thyroid nodule. Recommend thyroid ultrasound. (Ref: J Am Coll Radiol. 2015 Feb;12(2): 143-50).   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, pleomorphic, and myxoid areas and associated necrosis (~20%) - Tumor based in cervix and also involves lower uterine segment - Cervicovaginal margin involved by focal invasive leiomyosarcoma (3:00-5:00, A10) as well as tumor in lymphovascular spaces - Leiomyosarcoma involves right and left parametrial tissue and extends to parametrial  margins - Extensive lymphovascular space invasion present, including in uterus and parametria - See synoptic report and comment   Other findings: - Leiomyomata with hyalinization, size up to 3.0 cm - Ovaries and fallopian tubes with no parenchymal involvement by leiomyosarcoma identified, although adnexal lymphovascular space invasion is present   B: Lymph nodes, right pelvic, lymphadenectomy - One of four lymph nodes positive for metastatic leiomyosarcoma (1/4), with extracapsular extension present   C: Lymph nodes, left pelvic, lymphadenectomy - One of four lymph nodes positive for metastatic leiomyosarcoma (1/4), with extracapsular extension present  Immunohistochemical stains are performed on block A11, and demonstrate that the tumor is positive for desmin and CD10, with SMA staining the majority of the spindle cell component but largely negative in the epithelioid / pleomorphic component. OSCAR, pancytokeratin AE1/AE3, HMB45, and PR appear negative in the tumor. ER shows patchy weak staining and myogenin stains rare cells. Block A23 also shows positive desmin and negative OSCAR pancytokeratin. Overall, the findings are most consistent with leiomyosarcoma, with extensive areas that are myxoid, epithelioid, and pleomorphic as well as more typical spindle cell areas within the overall high grade tumor (grade 3 / 3). The tumor is staged as pT2b (involves other pelvic tissues) given the parametrial involvement and pN1 for FIGO stage IIIC.    07/17/2021 Surgery   Date of Surgery: 07/17/21  Preoperative Diagnosis: High Grade Uterine Sarcoma  Postoperative Diagnosis: Same  Procedure(s): Bilateral - RADICAL ABDOMINAL HYSTER, W/BIL TOTAL PELVIC LYMPHADENECTOMY & PARA-AORTIC LYMPH NODE BX W/WO REM TUBE/OVAR VAGINAL HYSTERECTOMY, FOR UTERUS 250 G OR LESS; WITH REPAIR OF ENTEROCELE COLECTOMY, PARTIAL; WITH COLOPROCTOSTOMY (LOW PELVIC ANASTOMOSIS) WITH COLOSTOMY CYSTOURETHROSCOPY, WITH INSERTION OF  INDWELLING URETERAL STENT (EG, GIBBONS OR DOUBLE-J TYPE) - Cystourethroscopy - Bilateral ureteral stent placement - Foley catheter placement  Performing Service: Gynecology Oncology Surgeon(s) and Role: Panel 1: * Lafonda Mosses, MD - Primary * Bernadene Bell, MD - Resident - Assisting * Devonne Doughty, MD - Resident - Assisting Panel 2: * Franchot Erichsen, MD - Primary  Drains:  - Left 6Fr open-ended ureteral access catheter (green) - Right 5Fr open-ended ureteral access catheter (white) - 16Fr foley catheter to drainage  * No implants in log *  Indications: 56 y.o. female with high grade uterine sarcoma. Urology was consulted pre-operatively for placement of bilateral ureteral stents. Risks, benefits, and alternatives of the above procedure were discussed and informed consent was signed.  OperativeFindings:  - Grossly distorted architecture of urinary bladder likely 2/2 pelvic mass with anterolaterally positioned UOs - Successful placement of bilateral open-ended ureteral catheters under direct visualization - Foley catheter placed at case conclusion  Description: The patient was correctly identified in the preop holding area where written informed consent as  well potential risk and complication reviewed. She agreed. The patient was brought to the operative suite where a preinduction timeout was performed. Once correct information was verified, general anesthesia was induced. The patient was then gently placed into dorsal lithotomy position with SCDs in place for VTE prophylaxis. They were prepped and draped in the usual sterile fashion and given appropriate preoperative antibiotics. A second timeout was then performed.   We inserted a 31F rigid cystoscope per urethra with copious lubrication and normal saline irrigation running. We performed cystourethroscopy, which revealed the above findings.  We turned our attention to the left ureteral orifice and canulated it with  a sensor wire, using assistance of a 6Fr open-ended catheter. The wire was advanced into the renal pelvis without difficulty under visual guidance. We then advanced the stent over our wire into the renal pelvis under direct visualization and feel without complication. The wire was subsequently removed.   We then turned our attention to the right ureteral orifice and canulated it with a sensor wire, using assistance of a 5Fr open-ended catheter. The wire was advanced into the renal pelvis without difficulty under visual guidance. We then advanced the stent over our wire into the renal pelvis under direct visualization and feel without complication. The wire was subsequently removed.   A 16Fr straight catheter was placed, with return of urine indicating appropriate position within the bladder. The balloon was inflated with 10cc sterile water. The stents were secured to the Foley using 0-silk ties, being careful not to occlude the stents or Foley.   The patient was awoken from general anesthesia having tolerated the procedure well and taken to the PACU for routine post-operative recovery.  Post-Op Plan:  - Foley and stents per primary team    07/17/2021 Surgery   Date of Surgery: 07/17/2021  Pre-op Diagnosis: Uterine spindle cell malignancy  Post-op Diagnosis: Same  Procedure(s): Panel 1 RADICAL ABDOMINAL HYSTER, with bilateral S&O, vagineconty upper, bilateral pelvic lyphadenectomy, bilateral ureterolysis,: 24235 (CPT) Panel 2 CYSTOURETHROSCOPY, WITH INSERTION OF INDWELLING URETERAL STENT (EG, GIBBONS OR DOUBLE-J TYPE): 36144 (CPT) Note: Revisions to procedures should be made in chart - see Procedures activity.  Performing Service: Gynecology Oncology Surgeon(s) and Role: Panel 1: * Lafonda Mosses, MD - Primary * Bernadene Bell, MD - Resident - Assisting * Devonne Doughty, MD - Resident - Assisting Panel 2: * Franchot Erichsen, MD - Primary  Findings: On bimanual exam, 10cm  necrotic mass filling upper vagina, unable to discretely palpate the cervix. On rectovaginal exam, rectal involvement not identified. Intraoperatively, normal upper abdominal survey including normal liver, diaphragm, stomach, omentum and bowel. Small uterus with 10cm mass expanding the cervix. Palpably enlarged bilateral pelvic lymph nodes adherent to the external iliac veins and obturator nerves, removed. No palpable para-aortic lymphadenopathy. No rectal involvement of uterine mass.   Specimens:  ID Type Source Tests Collected by Time Destination  1 : uterus,cervix,bilateral tubes/ovaries Tissue Uterus SURGICAL PATHOLOGY EXAM Lafonda Mosses, MD 07/17/2021 0932  2 : right pelvic lymph node Tissue Lymph Node SURGICAL PATHOLOGY EXAM Lafonda Mosses, MD 07/17/2021 1125  3 : LEFT PELVIC LN Tissue Lymph Node SURGICAL PATHOLOGY EXAM Lafonda Mosses, MD 07/17/2021 1144    08/06/2021 Initial Diagnosis   Uterine leiomyosarcoma ([place])   08/06/2021 Cancer Staging   Staging form: Corpus Uteri - Leiomyosarcoma and Endometrial Stromal Sarcoma, AJCC 8th Edition - Pathologic stage from 08/06/2021: FIGO Stage IVB (pT3, pN1, cM1) - Signed by [name], MD on 08/11/2021  Stage prefix: Initial diagnosis   08/10/2021 Imaging   CT abdomen and pelvis 1. Interval development of left lobe pulmonary nodules, measuring up to 7 mm and highly for metastatic disease. 2. Interval development of small to upper normal lymph nodes in the pelvis, concerning for metastatic disease. 3. Postoperative seroma left pelvic sidewall. 4. Tiny cluster of tree-in-bud opacity in the peripheral right lower lobe is new and compatible with sequelae of atypical infection.   08/13/2021 Procedure   Procedure: Placement of a right IJ approach single lumen PowerPort.  Tip is positioned at the superior cavoatrial junction and catheter is ready for immediate use.  Complications: No immediate   08/14/2021 Echocardiogram    1. Left  ventricular ejection fraction, by estimation, is 60 to 65%. The left ventricle has normal function. The left ventricle has no regional wall motion abnormalities. Left ventricular diastolic parameters were normal. The average left ventricular global longitudinal strain is -17.4 %. The global longitudinal strain is normal.  2. Right ventricular systolic function is normal. The right ventricular size is normal.  3. The mitral valve is normal in structure. No evidence of mitral valve regurgitation. No evidence of mitral stenosis.  4. The aortic valve is tricuspid. Aortic valve regurgitation is not visualized. No aortic stenosis is present.  5. The inferior vena cava is normal in size with greater than 50% respiratory variability, suggesting right atrial pressure of 3 mmHg.     08/17/2021 Imaging   Multiple new and enlarging pulmonary nodules scattered throughout the lungs bilaterally, highly concerning for progressive metastatic disease to the lungs   08/18/2021 - 11/10/2021 Chemotherapy   Patient is on Treatment Plan : UTERINE LEIOMYOSARCOMA Doxorubicin q21d x 6 Cycles     08/18/2021 - 08/12/2022 Chemotherapy   Patient is on Treatment Plan : UTERINE UNDIFFERENTIATED LEIOMYOSARCOMA Gemcitabine D1,8 + Docetaxel D8 (900/100) q21d     11/09/2021 Imaging   IMPRESSION: 1. Multiple small bilateral pulmonary nodules, some of which are slightly increased in size. Other nodules unchanged. 2. Interval decrease in size of left pelvic sidewall lymph nodes. 3. Unchanged size of perirectal lymph nodes or soft tissue nodules. These however demonstrate new internal hypodensity, suggesting treatment response and internal necrosis. 4. Unchanged left iliac lymph node or peritoneal nodule. 5. Findings are consistent with mixed response to treatment. No evidence of new metastatic disease in the chest, abdomen, or pelvis. 6. Wall thickening and mucosal hyperenhancement of the bladder, consistent with nonspecific infectious  or inflammatory cystitis. Correlate with urinalysis. 7. Status post hysterectomy and oophorectomy. Interval resolution of a previously noted left pelvic hematoma or seroma. 8. Trace, nonspecific free fluid in the low pelvis.   11/30/2021 Echocardiogram    1. Left ventricular ejection fraction, by estimation, is 40 to 45%. Left ventricular ejection fraction by 3D volume is 41 %. The left ventricle has mildly decreased function. The left ventricle has no regional wall motion abnormalities. Left ventricular  diastolic parameters are consistent with Grade I diastolic dysfunction (impaired relaxation).  2. Right ventricular systolic function is moderately reduced. The right ventricular size is normal.  3. The mitral valve is grossly normal. No evidence of mitral valve regurgitation.  4. The aortic valve is normal in structure. Aortic valve regurgitation is not visualized. No aortic stenosis is present.     12/07/2021 - 05/31/2022 Chemotherapy   Patient is on Treatment Plan : UTERINE UNDIFFERENTIATED / LEIOMYOSARCOMA Gemcitabine D1,8 + Docetaxel D8 (900/100) q21d     02/05/2022 Imaging   Pathologic burst fracture of  L3 due to a metastatic lesion, with probable mild degree of right-sided extraosseous tumor extension in the paraspinal soft tissues, and mild involvement of the right pedicle. No epidural/spinal canal involvement.   Multilevel degenerative disc disease without any significant stenosis in the lumbar spine.   05/31/2022 Imaging   1. Signs of pelvic resolution of pelvic sidewall nodal disease/soft tissue near the LEFT vaginal apex. 2. Decreased conspicuity of RIGHT lower lobe pulmonary nodule and stable LEFT apical pulmonary nodule. 3. Unchanged appearance of pathologic fracture at L3. 4. Urinary bladder wall thickening, slightly improved posteriorly, anterior urinary bladder may show some residual diffuse thickening. Continued correlation with signs of cystitis is suggested.     08/24/2022  Imaging   1. Interval growth of a solitary right external iliac nodal metastasis. No additional sites of new or progressive metastatic disease. 2. Small bilateral upper lobe pulmonary nodules are stable to mildly decreased. 3. Chronic incompletely healed pathologic L3 vertebral fracture with underlying lytic metastasis, not appreciably changed. No new focal osseous lesions. 4. New trace dependent bilateral pleural effusions. 5.  Aortic Atherosclerosis (ICD10-I70.0).     08/27/2022 Procedure   1. Successful L3 Osteocool RFA ablation. 2. Successful L3 kyphoplasty.   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 Multiple small bilateral pulmonary nodules, some of which are slightly increased in size. Other nodules unchanged. 2. Interval decrease in size of left pelvic sidewall lymph nodes. 3. Unchanged size of perirectal lymph nodes or soft tissue nodules. These however demonstrate new internal hypodensity, suggesting treatment response and internal necrosis. 4. Unchanged left iliac lymph node or peritoneal nodule. 5. Findings are consistent with mixed response to treatment. No evidence of new metastatic disease in the chest, abdomen, or pelvis. 6. Wall thickening and mucosal hyperenhancement of the bladder, consistent with nonspecific infectious or inflammatory cystitis. Correlate with urinalysis. 7. Status post hysterectomy and oophorectomy. Interval resolution of a previously noted left pelvic hematoma or seroma. 8. Trace, nonspecific free fluid in the low pelvis.   12/07/2021 - 05/31/2022 Chemotherapy   Patient is  on Treatment Plan : UTERINE UNDIFFERENTIATED / LEIOMYOSARCOMA Gemcitabine D1,8 + Docetaxel D8 (900/100) q21d       PHYSICAL EXAMINATION: ECOG PERFORMANCE STATUS: 1 - Symptomatic but completely ambulatory  Vitals:   09/28/22 1139  BP: 120/69  Pulse: 92  Resp: 18  Temp: 98.8 F (37.1 C)  SpO2: 100%   Filed Weights   09/28/22 1139  Weight: 130 lb 3.2 oz (59.1 kg)    GENERAL:alert, no distress and comfortable SKIN: She has a palpable nodule on the back of her skull, mild tender to palpation NEURO: alert & oriented x 3 with fluent speech, no focal motor/sensory deficits  LABORATORY DATA:  I have reviewed the data as listed    Component Value Date/Time   NA 142 09/28/2022 1119   K 3.7 09/28/2022 1119   CL 109 09/28/2022 1119   CO2 26 09/28/2022 1119   GLUCOSE 135 (H) 09/28/2022 1119   BUN 14 09/28/2022 1119   CREATININE 0.62 09/28/2022 1119   CREATININE 0.70 08/23/2022 1103   CALCIUM 8.9 09/28/2022 1119   PROT  6.3 (L) 09/28/2022 1119   ALBUMIN 3.7 09/28/2022 1119   AST 19 09/28/2022 1119   AST 11 (L) 08/23/2022 1103   ALT 17 09/28/2022 1119   ALT 10 08/23/2022 1103   ALKPHOS 65 09/28/2022 1119   BILITOT 0.4 09/28/2022 1119   BILITOT 0.3 08/23/2022 1103   GFRNONAA >60 09/28/2022 1119   GFRNONAA >60 08/23/2022 1103    No results found for: "SPEP", "UPEP"  Lab Results  Component Value Date   WBC 3.9 (L) 09/28/2022   NEUTROABS 2.8 09/28/2022   HGB 12.7 09/28/2022   HCT 38.2 09/28/2022   MCV 96.2 09/28/2022   PLT 165 09/28/2022      Chemistry      Component Value Date/Time   NA 142 09/28/2022 1119   K 3.7 09/28/2022 1119   CL 109 09/28/2022 1119   CO2 26 09/28/2022 1119   BUN 14 09/28/2022 1119   CREATININE 0.62 09/28/2022 1119   CREATININE 0.70 08/23/2022 1103      Component Value Date/Time   CALCIUM 8.9 09/28/2022 1119   ALKPHOS 65 09/28/2022 1119   AST 19 09/28/2022 1119   AST 11 (L) 08/23/2022 1103   ALT 17 09/28/2022 1119   ALT 10 08/23/2022  1103   BILITOT 0.4 09/28/2022 1119   BILITOT 0.3 08/23/2022 1103       RADIOGRAPHIC STUDIES: I have personally reviewed the radiological images as listed and agreed with the findings in the report. IR Radiologist Eval & Mgmt  Result Date: 09/09/2022 EXAM: PATIENT OFFICE VISIT CHIEF COMPLAINT: Chrishelle Zito Girten is a 56 y.o. female with history of metastatic Uterine Leiomyosarcoma had continued back pain, pressure, and stiffness related to pathologic fracture of the L3 vertebral body. She was treated with Osteocool and Kyphoplasty on 08/26/2022. She reports that her pain and stiffness is significantly improved. She is able to carry out her daily life activities much more easily after the procedures. She has had intermittent headaches for the past two days. She also develops focal swelling over her tail bone when she is particularly active for the day. She denies fever, chills, or new back pain. HISTORY OF PRESENT ILLNESS: Past Medical History: Please see Epic note Medications: Please see Epic note Allergies: Please see Epic note Social History: Please see Epic note Family History:  Please see Epic note REVIEW OF SYSTEMS: Please see Epic note PHYSICAL EXAMINATION: Please see Epic note ASSESSMENT AND PLAN: Please see Epic note Electronically Signed   By: Miachel Roux M.D.   On: 09/09/2022 14:58

## 2022-09-28 NOTE — Assessment & Plan Note (Signed)
She has a palpable bone lesion at the back of her skull It is not clear what caused it I reviewed her imaging study of her head from March which show irregular bone island I plan to repeat CT imaging of her head without contrast for further assessment

## 2022-09-28 NOTE — Assessment & Plan Note (Signed)
She has completed radiation therapy and kyphoplasty I plan to repeat imaging study in February for objective assessment of response to therapy Her pancytopenia is resolving I recommend physical therapy and rehab

## 2022-09-28 NOTE — Assessment & Plan Note (Signed)
She has weakness and deconditioning I recommend PT I have completed application to help her get disability parking placard

## 2022-09-28 NOTE — Assessment & Plan Note (Signed)
Overall, she is doing better Recommend physical therapy and rehab She is not ready for [facility]; she is still working with the dentist to get dental clearance

## 2022-09-30 ENCOUNTER — Telehealth: Payer: Self-pay

## 2022-10-08 ENCOUNTER — Telehealth: Payer: Self-pay

## 2022-10-08 ENCOUNTER — Other Ambulatory Visit: Payer: Self-pay | Admitting: Oncology

## 2022-10-08 MED ORDER — PAXLOVID (300/100) 20 X 150 MG & 10 X 100MG PO TBPK: 3.0000 | ORAL_TABLET | Freq: Two times a day (BID) | ORAL | 0 refills | Status: DC

## 2022-10-08 NOTE — Telephone Encounter (Signed)
Returned patient's call regarding recent positive Covid test. Patient requesting medication be sent in for infection. Mikey Bussing, NP, made aware. NP reviewed chart and agreed to send in [facility]. Confirmed preferred pharmacy with patient and reviewed possible side effects of medication. Educated patient on additional interventions to manage symptoms and s/s that required further evaluation. Patient verbalized an understanding of the information. Patient aware that she has access to triage line over holiday weekend should she have any additional questions or concerns.

## 2022-10-13 ENCOUNTER — Other Ambulatory Visit: Payer: Self-pay | Admitting: Hematology and Oncology

## 2022-10-13 ENCOUNTER — Telehealth: Payer: Self-pay

## 2022-10-13 ENCOUNTER — Ambulatory Visit (HOSPITAL_COMMUNITY)
Admission: RE | Admit: 2022-10-13 | Discharge: 2022-10-13 | Disposition: A | Discharge status: Home / Self Care | Payer: BC Managed Care – PPO | Source: Ambulatory Visit | Attending: Hematology and Oncology | Admitting: Hematology and Oncology

## 2022-10-13 DIAGNOSIS — C55 Malignant neoplasm of uterus, part unspecified: Secondary | ICD-10-CM

## 2022-10-13 NOTE — Telephone Encounter (Signed)
I can order X ray of her skull for evaluation Can she get her Xray today here at [facility] or [facility]? I can see her tomorrow at 31 20, make sure she checks in early

## 2022-10-13 NOTE — Telephone Encounter (Signed)
Called with below message. She is still having nasal stuffiness, sneezing and cough. Denies fever related to covid. She missed Paxlovid dose due to having nausea and has 4 doses left to take. She did not take zofran or compazine to help with nausea. She is asking Dr. Alvy Bimler if it is okay to stop the paxlovid?  CT head and abdomen are scheduled on the same day.  The bump sore area to back of her head feels bigger and uncomfortable. She can feel the area under her skin. She takes tylenol for the uncomfortable feeling. The area feels sore to touch and sore when she lays down.

## 2022-10-13 NOTE — Telephone Encounter (Signed)
-----   Message from [name], MD sent at 10/13/2022  8:28 AM EST ----- Can you call and ask how is she doing? She tested positive for COVID recently Also, can you investigate with radiology whether they schedule both CT ab/pel and CT head together? I did not see both scheduled together. If they can be done together please make sure she showed up early if needed

## 2022-10-13 NOTE — Telephone Encounter (Signed)
Called and given below message. She verbalized understanding and appreciated the call. Called [facility] radiology and she can come in today with covid positive on 12/27.

## 2022-10-14 ENCOUNTER — Other Ambulatory Visit: Payer: Self-pay

## 2022-10-14 ENCOUNTER — Inpatient Hospital Stay: Payer: BC Managed Care – PPO | Attending: Gynecologic Oncology | Admitting: Hematology and Oncology

## 2022-10-14 ENCOUNTER — Encounter: Payer: Self-pay | Admitting: Hematology and Oncology

## 2022-10-14 VITALS — BP 143/86 | HR 96 | Temp 98.2°F | Resp 18 | Ht 68.0 in | Wt 129.0 lb

## 2022-10-14 DIAGNOSIS — C55 Malignant neoplasm of uterus, part unspecified: Secondary | ICD-10-CM

## 2022-10-14 DIAGNOSIS — C775 Secondary and unspecified malignant neoplasm of intrapelvic lymph nodes: Secondary | ICD-10-CM | POA: Diagnosis not present

## 2022-10-14 DIAGNOSIS — F419 Anxiety disorder, unspecified: Secondary | ICD-10-CM | POA: Insufficient documentation

## 2022-10-14 DIAGNOSIS — C7931 Secondary malignant neoplasm of brain: Secondary | ICD-10-CM | POA: Insufficient documentation

## 2022-10-14 DIAGNOSIS — Z510 Encounter for antineoplastic radiation therapy: Secondary | ICD-10-CM | POA: Diagnosis present

## 2022-10-14 DIAGNOSIS — C7800 Secondary malignant neoplasm of unspecified lung: Secondary | ICD-10-CM | POA: Diagnosis not present

## 2022-10-14 DIAGNOSIS — I700 Atherosclerosis of aorta: Secondary | ICD-10-CM | POA: Diagnosis not present

## 2022-10-14 DIAGNOSIS — C786 Secondary malignant neoplasm of retroperitoneum and peritoneum: Secondary | ICD-10-CM | POA: Diagnosis not present

## 2022-10-14 DIAGNOSIS — R519 Headache, unspecified: Secondary | ICD-10-CM | POA: Diagnosis not present

## 2022-10-14 DIAGNOSIS — Z7189 Other specified counseling: Secondary | ICD-10-CM

## 2022-10-14 DIAGNOSIS — Z90710 Acquired absence of both cervix and uterus: Secondary | ICD-10-CM | POA: Insufficient documentation

## 2022-10-14 DIAGNOSIS — R110 Nausea: Secondary | ICD-10-CM | POA: Insufficient documentation

## 2022-10-14 DIAGNOSIS — Z7952 Long term (current) use of systemic steroids: Secondary | ICD-10-CM | POA: Insufficient documentation

## 2022-10-14 DIAGNOSIS — C7951 Secondary malignant neoplasm of bone: Secondary | ICD-10-CM | POA: Diagnosis not present

## 2022-10-14 DIAGNOSIS — Z79899 Other long term (current) drug therapy: Secondary | ICD-10-CM | POA: Insufficient documentation

## 2022-10-14 DIAGNOSIS — Z90722 Acquired absence of ovaries, bilateral: Secondary | ICD-10-CM | POA: Insufficient documentation

## 2022-10-14 NOTE — Assessment & Plan Note (Signed)
We discussed goals of care briefly

## 2022-10-14 NOTE — Assessment & Plan Note (Signed)
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

## 2022-10-14 NOTE — Assessment & Plan Note (Signed)
She is not symptomatic I will order CT imaging of the chest to assess

## 2022-10-14 NOTE — Assessment & Plan Note (Signed)
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

## 2022-10-14 NOTE — Progress Notes (Signed)
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[facility]) I have reviewed her skull x-ray with the patient and her husband Unfortunately, she has new metastatic disease to her skull We discussed the rationale of ordering bone scan, MRI of the brain and complete CT imaging to reassess her cancer burden The patient will need to go back on chemotherapy again We discussed overall poor prognosis due to rapid disease relapse  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[facility]) She is not symptomatic I will order CT imaging of the chest to assess  Metastasis to bone [facility]) She has now evidence of continued metastatic disease to her skull I will order a bone scan to assess She has prescription narcotics to take as needed for pain  Goals of care, counseling/discussion We discussed goals of care briefly  Orders Placed This Encounter  Procedures   NM Bone Scan Whole Body    Standing Status:   Future    Standing Expiration Date:   10/14/2023    Order Specific Question:   If indicated for the ordered procedure, I authorize the administration of a radiopharmaceutical per Radiology protocol    Answer:   Yes    Order Specific Question:   Is the patient pregnant?    Answer:   No    Order Specific Question:   Preferred imaging location?    Answer:   [facility]   MR Brain W Wo Contrast    Standing Status:   Future    Standing Expiration Date:   10/14/2023    Order Specific Question:   If indicated for the ordered procedure, I authorize the administration of contrast media per Radiology protocol    Answer:   Yes    Order Specific Question:   What is the patient's sedation requirement?    Answer:   No Sedation    Order Specific Question:   Does the patient have a pacemaker or implanted devices?    Answer:   No    Order Specific Question:   Use SRS Protocol?    Answer:   No     Order Specific Question:   Preferred imaging location?    Answer:   [facility] (table limit [phone])    All questions were answered. The patient knows to call the clinic with any problems, questions or concerns. The total time spent in the appointment was 30 minutes encounter with patients including review of chart and various tests results, discussions about plan of care and coordination of care plan   [name], MD 10/14/2022 11:36 AM  INTERVAL HISTORY: Please see below for problem oriented charting. she returns for follow-up with her husband She is recovering well from recent COVID infection She has noted enlarging mass at the back of her skull, confirmed on imaging study to be recurrent metastatic disease She denies pain elsewhere  REVIEW OF SYSTEMS:   Constitutional: Denies fevers, chills or abnormal weight loss Eyes: Denies blurriness of vision Ears, nose, mouth, throat, and face: Denies mucositis or sore throat Respiratory: Denies cough, dyspnea or wheezes Cardiovascular: Denies palpitation, chest discomfort or lower extremity swelling Gastrointestinal:  Denies nausea, heartburn or change in bowel habits Skin: Denies abnormal skin rashes Lymphatics: Denies new lymphadenopathy or easy bruising Neurological:Denies numbness, tingling or new weaknesses Behavioral/Psych: Mood is stable, no new changes  All other systems were reviewed with the patient and  are negative.  I have reviewed the past medical history, past surgical history, social history and family history with the patient and they are unchanged from previous note.  ALLERGIES:  is allergic to doxycycline.  MEDICATIONS:  Current Outpatient Medications  Medication Sig Dispense Refill   acetaminophen (TYLENOL) 500 MG tablet Take 1,000 mg by mouth every 6 (six) hours as needed for moderate pain or headache.     calcium carbonate (TUMS - DOSED IN MG ELEMENTAL CALCIUM) 500 MG chewable tablet Chew 1 tablet by mouth  2 (two) times daily.     cholecalciferol (VITAMIN D3) 25 MCG (1000 UNIT) tablet Take 2,000 Units by mouth daily.     estradiol (ESTRACE) 0.1 MG/GM vaginal cream PLACE FINGER TIP SIZE AMOUNT OF CREAM AND INSERT SLIGHTLY PAST THE VAGINAL ENTRANCE 3 TIMES DAILY (Patient not taking: Reported on 08/03/2022) 126 g 4   lidocaine (XYLOCAINE) 2 % solution SMARTSIG:By Mouth     lidocaine-prilocaine (EMLA) cream Apply to affected area once 30 g 3   loratadine (CLARITIN) 10 MG tablet Take 10 mg by mouth daily as needed (for bone aches).     LORazepam (ATIVAN) 0.5 MG tablet Take 1 tablet (0.5 mg total) by mouth 2 (two) times daily as needed for anxiety. (Patient not taking: Reported on 04/26/2022) 30 tablet 0   magic mouthwash (nystatin, diphenhydrAMINE, alum & mag hydroxide) suspension mixture Swish and spit 5 mLs 4 (four) times daily as needed for mouth pain. 240 mL 0   metoprolol succinate (TOPROL XL) 25 MG 24 hr tablet Take 1 tablet (25 mg total) by mouth at bedtime. 30 tablet 6   ondansetron (ZOFRAN) 8 MG tablet Take 1 tablet (8 mg total) by mouth every 8 (eight) hours as needed. (Patient not taking: Reported on 08/03/2022) 30 tablet 1   prochlorperazine (COMPAZINE) 10 MG tablet Take 1 tablet (10 mg total) by mouth every 6 (six) hours as needed (Nausea or vomiting). 90 tablet 1   senna (SENOKOT) 8.6 MG TABS tablet Take 2 tablets by mouth at bedtime.     No current facility-administered medications for this visit.    SUMMARY OF ONCOLOGIC HISTORY: Oncology History  Uterine leiomyosarcoma ([place])  06/11/2021 Imaging   1. 9.5 x 7.6 x 9.0 cm complex, partially necrotic, mass involving the lower uterine segment/ cervix. No obvious direct extension into the parametrium.  2. 9 mm left pelvic sidewall lymph node is partially necrotic and worrisome for metastatic adenopathy.  3. No findings for abdominal omental or peritoneal surface disease or adenopathy.  4. Tiny low-attenuation lesion in the pancreatic head,  likely benign cyst but attention on follow-up scans is suggested.  5. 2.9 cm fundal fibroid.    06/19/2021 Pathology Results   FINAL MICROSCOPIC DIAGNOSIS:   A. UTERINE, CERVICAL MASS, BIOPSY:  - Spindle cell malignancy.  - See comment.   COMMENT:  The biopsies consist of endocervical mucosa with stromal edema and one biopsy fragment has a microscopic focus with atypical spindle cells consistent with poorly differentiated malignancy.  The differential  includes a spindle cell malignancy such as sarcomatoid carcinoma and leiomyosarcoma.  Mullerian adenosarcoma is also a consideration but considered less likely   06/23/2021 Imaging   MR pelvis  10 cm uterine mass with central necrosis, which is centered in the cervix and lower uterine segment. Right parametrial involvement is seen as well as suspected invasion of the distal rectum. Differential diagnosis includes cervical carcinoma and uterine leiomyosarcoma.   Mild bilateral iliac lymphadenopathy, highly suspicious for  metastatic disease.   2.9 cm subserosal fibroid in the posterior fundus.   Normal appearance of both ovaries.     06/29/2021 PET scan   1. Hypermetabolic necrotic cervical/uterine mass with bilateral external iliac hypermetabolic lymph nodes. No evidence of distant metastatic disease. 2. 1.5 cm low-attenuation left thyroid nodule. Recommend thyroid ultrasound. (Ref: J Am Coll Radiol. 2015 Feb;12(2): 143-50).   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, pleomorphic, and myxoid areas and associated necrosis (~20%) - Tumor based in cervix and also involves lower uterine segment - Cervicovaginal margin involved by focal invasive leiomyosarcoma (3:00-5:00, A10) as well as tumor in lymphovascular spaces - Leiomyosarcoma involves right and left  parametrial tissue and extends to parametrial margins - Extensive lymphovascular space invasion present, including in uterus and parametria - See synoptic report and comment   Other findings: - Leiomyomata with hyalinization, size up to 3.0 cm - Ovaries and fallopian tubes with no parenchymal involvement by leiomyosarcoma identified, although adnexal lymphovascular space invasion is present   B: Lymph nodes, right pelvic, lymphadenectomy - One of four lymph nodes positive for metastatic leiomyosarcoma (1/4), with extracapsular extension present   C: Lymph nodes, left pelvic, lymphadenectomy - One of four lymph nodes positive for metastatic leiomyosarcoma (1/4), with extracapsular extension present  Immunohistochemical stains are performed on block A11, and demonstrate that the tumor is positive for desmin and CD10, with SMA staining the majority of the spindle cell component but largely negative in the epithelioid / pleomorphic component. OSCAR, pancytokeratin AE1/AE3, HMB45, and PR appear negative in the tumor. ER shows patchy weak staining and myogenin stains rare cells. Block A23 also shows positive desmin and negative OSCAR pancytokeratin. Overall, the findings are most consistent with leiomyosarcoma, with extensive areas that are myxoid, epithelioid, and pleomorphic as well as more typical spindle cell areas within the overall high grade tumor (grade 3 / 3). The tumor is staged as pT2b (involves other pelvic tissues) given the parametrial involvement and pN1 for FIGO stage IIIC.    07/17/2021 Surgery   Date of Surgery: 07/17/21  Preoperative Diagnosis: High Grade Uterine Sarcoma  Postoperative Diagnosis: Same  Procedure(s): Bilateral - RADICAL ABDOMINAL HYSTER, W/BIL TOTAL PELVIC LYMPHADENECTOMY & PARA-AORTIC LYMPH NODE BX W/WO REM TUBE/OVAR VAGINAL HYSTERECTOMY, FOR UTERUS 250 G OR LESS; WITH REPAIR OF ENTEROCELE COLECTOMY, PARTIAL; WITH COLOPROCTOSTOMY (LOW PELVIC ANASTOMOSIS) WITH  COLOSTOMY CYSTOURETHROSCOPY, WITH INSERTION OF INDWELLING URETERAL STENT (EG, GIBBONS OR DOUBLE-J TYPE) - Cystourethroscopy - Bilateral ureteral stent placement - Foley catheter placement  Performing Service: Gynecology Oncology Surgeon(s) and Role: Panel 1: * Lafonda Mosses, MD - Primary * Bernadene Bell, MD - Resident - Assisting * Devonne Doughty, MD - Resident - Assisting Panel 2: * Franchot Erichsen, MD - Primary  Drains:  - Left 6Fr open-ended ureteral access catheter (green) - Right 5Fr open-ended ureteral access catheter (white) - 16Fr foley catheter to drainage  * No implants in log *  Indications: 57 y.o. female with high grade uterine sarcoma. Urology was consulted pre-operatively for placement of bilateral ureteral stents. Risks, benefits, and alternatives of the above procedure were discussed and informed consent was signed.  OperativeFindings:  - Grossly distorted architecture of urinary bladder likely 2/2 pelvic mass with anterolaterally positioned UOs - Successful placement of bilateral open-ended ureteral catheters under direct visualization - Foley catheter placed at case conclusion  Description: The patient was correctly identified in the preop holding area where written informed consent as well potential risk and complication reviewed. She agreed. The patient was brought to the operative suite where a preinduction timeout was performed. Once correct information was verified, general anesthesia was induced. The patient was then gently placed into dorsal lithotomy position with SCDs in place for VTE prophylaxis. They were prepped and draped in the usual sterile fashion and given appropriate preoperative antibiotics. A second timeout was then performed.   We inserted a 35F rigid cystoscope per urethra with copious lubrication and normal saline irrigation running. We performed cystourethroscopy, which revealed the above findings.  We turned our attention to  the left ureteral orifice and canulated it with a sensor wire, using assistance of a 6Fr open-ended catheter. The wire was advanced into the renal pelvis without difficulty under visual guidance. We then advanced the stent over our wire into the renal pelvis under direct visualization and feel without complication. The wire was subsequently removed.   We then turned our attention to the right ureteral orifice and canulated it with a sensor wire, using assistance of a 5Fr open-ended catheter. The wire was advanced into the renal pelvis without difficulty under visual guidance. We then advanced the stent over our wire into the renal pelvis under direct visualization and feel without complication. The wire was subsequently removed.   A 16Fr straight catheter was placed, with return of urine indicating appropriate position within the bladder. The balloon was inflated with 10cc sterile water. The stents were secured to the Foley using 0-silk ties, being careful not to occlude the stents or Foley.   The patient was awoken from general anesthesia having tolerated the procedure well and taken to the PACU for routine post-operative recovery.  Post-Op Plan:  - Foley and stents per primary team    07/17/2021 Surgery   Date of Surgery: 07/17/2021  Pre-op Diagnosis: Uterine spindle cell malignancy  Post-op Diagnosis: Same  Procedure(s): Panel 1 RADICAL ABDOMINAL HYSTER, with bilateral S&O, vagineconty upper, bilateral pelvic lyphadenectomy, bilateral ureterolysis,: 89169 (CPT) Panel 2 CYSTOURETHROSCOPY, WITH INSERTION OF INDWELLING URETERAL STENT (EG, GIBBONS OR DOUBLE-J TYPE): 45038 (CPT) Note: Revisions to procedures should be made in chart - see Procedures activity.  Performing Service: Gynecology Oncology Surgeon(s) and Role: Panel 1: * Lafonda Mosses, MD - Primary * Bernadene Bell, MD - Resident - Assisting * Devonne Doughty, MD - Resident - Assisting Panel 2: * Franchot Erichsen,  MD - Primary  Findings: On bimanual exam, 10cm necrotic mass filling upper vagina, unable to discretely palpate the cervix. On rectovaginal exam, rectal involvement not identified. Intraoperatively, normal upper abdominal survey including normal liver, diaphragm, stomach, omentum and bowel. Small uterus with 10cm mass expanding the cervix. Palpably enlarged bilateral pelvic lymph nodes adherent to the external iliac veins and obturator nerves, removed. No palpable para-aortic lymphadenopathy. No rectal involvement of uterine mass.   Specimens:  ID Type Source Tests Collected by Time Destination  1 : uterus,cervix,bilateral tubes/ovaries Tissue Uterus SURGICAL PATHOLOGY EXAM Lafonda Mosses, MD 07/17/2021 0932  2 : right pelvic lymph node Tissue Lymph Node SURGICAL PATHOLOGY EXAM Lafonda Mosses, MD 07/17/2021 1125  3 : LEFT PELVIC LN Tissue Lymph Node SURGICAL PATHOLOGY EXAM Lafonda Mosses, MD 07/17/2021 1144    08/06/2021 Initial Diagnosis   Uterine leiomyosarcoma ([place])   08/06/2021 Cancer Staging   Staging form: Corpus Uteri - Leiomyosarcoma and Endometrial Stromal Sarcoma, AJCC 8th Edition - Pathologic stage  from 08/06/2021: FIGO Stage IVB (pT3, pN1, cM1) - Signed by [name], MD on 08/11/2021 Stage prefix: Initial diagnosis   08/10/2021 Imaging   CT abdomen and pelvis 1. Interval development of left lobe pulmonary nodules, measuring up to 7 mm and highly for metastatic disease. 2. Interval development of small to upper normal lymph nodes in the pelvis, concerning for metastatic disease. 3. Postoperative seroma left pelvic sidewall. 4. Tiny cluster of tree-in-bud opacity in the peripheral right lower lobe is new and compatible with sequelae of atypical infection.   08/13/2021 Procedure   Procedure: Placement of a right IJ approach single lumen PowerPort.  Tip is positioned at the superior cavoatrial junction and catheter is ready for immediate use.  Complications: No immediate    08/14/2021 Echocardiogram    1. Left ventricular ejection fraction, by estimation, is 60 to 65%. The left ventricle has normal function. The left ventricle has no regional wall motion abnormalities. Left ventricular diastolic parameters were normal. The average left ventricular global longitudinal strain is -17.4 %. The global longitudinal strain is normal.  2. Right ventricular systolic function is normal. The right ventricular size is normal.  3. The mitral valve is normal in structure. No evidence of mitral valve regurgitation. No evidence of mitral stenosis.  4. The aortic valve is tricuspid. Aortic valve regurgitation is not visualized. No aortic stenosis is present.  5. The inferior vena cava is normal in size with greater than 50% respiratory variability, suggesting right atrial pressure of 3 mmHg.     08/17/2021 Imaging   Multiple new and enlarging pulmonary nodules scattered throughout the lungs bilaterally, highly concerning for progressive metastatic disease to the lungs   08/18/2021 - 11/10/2021 Chemotherapy   Patient is on Treatment Plan : UTERINE LEIOMYOSARCOMA Doxorubicin q21d x 6 Cycles     08/18/2021 - 08/12/2022 Chemotherapy   Patient is on Treatment Plan : UTERINE UNDIFFERENTIATED LEIOMYOSARCOMA Gemcitabine D1,8 + Docetaxel D8 (900/100) q21d     11/09/2021 Imaging   IMPRESSION: 1. Multiple small bilateral pulmonary nodules, some of which are slightly increased in size. Other nodules unchanged. 2. Interval decrease in size of left pelvic sidewall lymph nodes. 3. Unchanged size of perirectal lymph nodes or soft tissue nodules. These however demonstrate new internal hypodensity, suggesting treatment response and internal necrosis. 4. Unchanged left iliac lymph node or peritoneal nodule. 5. Findings are consistent with mixed response to treatment. No evidence of new metastatic disease in the chest, abdomen, or pelvis. 6. Wall thickening and mucosal hyperenhancement of the bladder,  consistent with nonspecific infectious or inflammatory cystitis. Correlate with urinalysis. 7. Status post hysterectomy and oophorectomy. Interval resolution of a previously noted left pelvic hematoma or seroma. 8. Trace, nonspecific free fluid in the low pelvis.   11/30/2021 Echocardiogram    1. Left ventricular ejection fraction, by estimation, is 40 to 45%. Left ventricular ejection fraction by 3D volume is 41 %. The left ventricle has mildly decreased function. The left ventricle has no regional wall motion abnormalities. Left ventricular  diastolic parameters are consistent with Grade I diastolic dysfunction (impaired relaxation).  2. Right ventricular systolic function is moderately reduced. The right ventricular size is normal.  3. The mitral valve is grossly normal. No evidence of mitral valve regurgitation.  4. The aortic valve is normal in structure. Aortic valve regurgitation is not visualized. No aortic stenosis is present.     12/07/2021 - 05/31/2022 Chemotherapy   Patient is on Treatment Plan : UTERINE UNDIFFERENTIATED / LEIOMYOSARCOMA Gemcitabine D1,8 +  Docetaxel D8 (900/100) q21d     02/05/2022 Imaging   Pathologic burst fracture of L3 due to a metastatic lesion, with probable mild degree of right-sided extraosseous tumor extension in the paraspinal soft tissues, and mild involvement of the right pedicle. No epidural/spinal canal involvement.   Multilevel degenerative disc disease without any significant stenosis in the lumbar spine.   05/31/2022 Imaging   1. Signs of pelvic resolution of pelvic sidewall nodal disease/soft tissue near the LEFT vaginal apex. 2. Decreased conspicuity of RIGHT lower lobe pulmonary nodule and stable LEFT apical pulmonary nodule. 3. Unchanged appearance of pathologic fracture at L3. 4. Urinary bladder wall thickening, slightly improved posteriorly, anterior urinary bladder may show some residual diffuse thickening. Continued correlation with signs of  cystitis is suggested.     08/24/2022 Imaging   1. Interval growth of a solitary right external iliac nodal metastasis. No additional sites of new or progressive metastatic disease. 2. Small bilateral upper lobe pulmonary nodules are stable to mildly decreased. 3. Chronic incompletely healed pathologic L3 vertebral fracture with underlying lytic metastasis, not appreciably changed. No new focal osseous lesions. 4. New trace dependent bilateral pleural effusions. 5.  Aortic Atherosclerosis (ICD10-I70.0).     08/27/2022 Procedure   1. Successful L3 Osteocool RFA ablation. 2. Successful L3 kyphoplasty.   10/14/2022 Imaging   Focal soft tissue swelling along the occipital region seen on lateral view may correspond to palpable area of clinical concern. Lytic calvarial lesion underlying this area measures 3.2 cm in craniocaudal dimension, suspicious for osseous metastasis in the setting of known malignancy. Recommend contrast-enhanced MRI or CT for further evaluation.   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([place])  08/11/2021 Initial Diagnosis   Pulmonary metastases ([place])   08/18/2021 - 11/10/2021 Chemotherapy   Patient is on Treatment Plan : UTERINE LEIOMYOSARCOMA Doxorubicin q21d x 6 Cycles     08/18/2021 - 08/12/2022 Chemotherapy   Patient is on Treatment Plan : UTERINE UNDIFFERENTIATED LEIOMYOSARCOMA Gemcitabine D1,8 + Docetaxel D8 (900/100) q21d     11/09/2021 Imaging   IMPRESSION: 1. Multiple small bilateral pulmonary nodules, some of which are slightly increased in size. Other nodules unchanged. 2. Interval decrease in size of left pelvic sidewall lymph nodes. 3. Unchanged size of perirectal lymph nodes or soft tissue nodules. These however demonstrate new internal hypodensity, suggesting treatment response and internal necrosis. 4. Unchanged left iliac lymph node or peritoneal nodule. 5. Findings are consistent with mixed response to treatment. No evidence of new metastatic disease in the  chest, abdomen, or pelvis. 6. Wall thickening and mucosal hyperenhancement of the bladder, consistent with nonspecific infectious or inflammatory cystitis. Correlate with urinalysis. 7. Status post hysterectomy and oophorectomy. Interval resolution of a previously noted left pelvic hematoma or seroma. 8. Trace, nonspecific free fluid in the low pelvis.   12/07/2021 - 05/31/2022 Chemotherapy   Patient is on Treatment Plan : UTERINE UNDIFFERENTIATED / LEIOMYOSARCOMA Gemcitabine D1,8 + Docetaxel D8 (900/100) q21d       PHYSICAL EXAMINATION: ECOG PERFORMANCE STATUS: 1 - Symptomatic but completely ambulatory  Vitals:   10/14/22 1059  BP: (!) 143/86  Pulse: 96  Resp: 18  Temp: 98.2 F (36.8 C)  SpO2: 100%   Filed Weights   10/14/22 1059  Weight: 129 lb (58.5 kg)    GENERAL:alert, no distress and comfortable NEURO: alert & oriented x 3 with fluent speech, no focal motor/sensory deficits  LABORATORY DATA:  I have reviewed the data as listed    Component Value Date/Time   NA  142 09/28/2022 1119   K 3.7 09/28/2022 1119   CL 109 09/28/2022 1119   CO2 26 09/28/2022 1119   GLUCOSE 135 (H) 09/28/2022 1119   BUN 14 09/28/2022 1119   CREATININE 0.62 09/28/2022 1119   CREATININE 0.70 08/23/2022 1103   CALCIUM 8.9 09/28/2022 1119   PROT 6.3 (L) 09/28/2022 1119   ALBUMIN 3.7 09/28/2022 1119   AST 19 09/28/2022 1119   AST 11 (L) 08/23/2022 1103   ALT 17 09/28/2022 1119   ALT 10 08/23/2022 1103   ALKPHOS 65 09/28/2022 1119   BILITOT 0.4 09/28/2022 1119   BILITOT 0.3 08/23/2022 1103   GFRNONAA >60 09/28/2022 1119   GFRNONAA >60 08/23/2022 1103    No results found for: "SPEP", "UPEP"  Lab Results  Component Value Date   WBC 3.9 (L) 09/28/2022   NEUTROABS 2.8 09/28/2022   HGB 12.7 09/28/2022   HCT 38.2 09/28/2022   MCV 96.2 09/28/2022   PLT 165 09/28/2022      Chemistry      Component Value Date/Time   NA 142 09/28/2022 1119   K 3.7 09/28/2022 1119   CL 109 09/28/2022  1119   CO2 26 09/28/2022 1119   BUN 14 09/28/2022 1119   CREATININE 0.62 09/28/2022 1119   CREATININE 0.70 08/23/2022 1103      Component Value Date/Time   CALCIUM 8.9 09/28/2022 1119   ALKPHOS 65 09/28/2022 1119   AST 19 09/28/2022 1119   AST 11 (L) 08/23/2022 1103   ALT 17 09/28/2022 1119   ALT 10 08/23/2022 1103   BILITOT 0.4 09/28/2022 1119   BILITOT 0.3 08/23/2022 1103       RADIOGRAPHIC STUDIES: I have reviewed imaging study with the patient I have personally reviewed the radiological images as listed and agreed with the findings in the report. DG Skull 1-3 Views  Result Date: 10/13/2022 CLINICAL DATA:  History of leiomyosarcoma with new painful palpable area in the right posterior skull. No known injury. EXAM: SKULL - 1-3 VIEW COMPARISON:  CT head dated 12/24/2021 FINDINGS: Focal soft tissue swelling along the occipital seen on lateral view, not well assessed on the frontal view, overlying an area of calvarial lytic lesion with permeative appearance measuring up to 3.2 cm in craniocaudal dimension. IMPRESSION: Focal soft tissue swelling along the occipital region seen on lateral view may correspond to palpable area of clinical concern. Lytic calvarial lesion underlying this area measures 3.2 cm in craniocaudal dimension, suspicious for osseous metastasis in the setting of known malignancy. Recommend contrast-enhanced MRI or CT for further evaluation. These results will be called to the ordering clinician or representative by the Radiologist Assistant, and communication documented in the PACS or [employer]. Electronically Signed   By: Darrin Nipper M.D.   On: 10/13/2022 16:42

## 2022-10-15 ENCOUNTER — Telehealth: Payer: Self-pay | Admitting: Oncology

## 2022-10-15 NOTE — Telephone Encounter (Signed)
Tammie Gilmore and scheduled an appointment to see Dr. Alvy Bimler on 10/28/22 at 2:00 to review her scans.  Also advised her that we will be canceling her follow up appointment with Dr. Sondra Come on 10/28/22 for now and will reschedule it once we have all her results from her scans.    She did ask if there would be any benefit from starting a generic chemo now while waiting for the scans.  Advised that I will ask Dr. Alvy Bimler and call her back.

## 2022-10-15 NOTE — Telephone Encounter (Signed)
Called Tammie Gilmore and advised her of message from Dr. Alvy Bimler.  She verbalized understanding and agreement.  Also called central scheduling and they do not have any sooner appointments for the CT or MRI.  Called [facility] and the soonest the could do the bone scan was 1/16.  Called [name] back and let her know and advised her to try calling central scheduling occasionally to see if they have any cancellations.

## 2022-10-15 NOTE — Telephone Encounter (Signed)
I cannot recommend just chemo to start without full evaluation If possible, we can see if other imaging studies have earlier opening

## 2022-10-19 NOTE — Radiation Completion Notes (Signed)
Patient Name: Tammie Gilmore, Tammie Gilmore MRN: [id number] Date of Birth: 03/19/1966 Referring Physician: Judd Lien, M.D. Date of Service: 2022-10-19 Radiation Oncologist: Teryl Lucy, M.D. 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 Intent: Curative     ==========DELIVERED PLANS==========  First Treatment Date: 2022-09-14 - Last Treatment Date: 2022-09-24   Plan Name: [email or web address] Site: Internal Iliac Nodes Technique: SBRT/SRT-IMRT Mode: Photon Dose Per Fraction: 10 Gy Prescribed Dose (Delivered / Prescribed): 50 Gy / 50 Gy Prescribed Fxs (Delivered / Prescribed): 5 / 5     ==========ON TREATMENT VISIT DATES========== 2022-09-14, 2022-09-16, 2022-09-20, 2022-09-22, 2022-09-22, 2022-09-24     ==========UPCOMING VISITS========== 2024-01-10T22:00:00[facility] [name], MD; [facility]        ==========APPENDIX - ON TREATMENT VISIT NOTES==========   PatEd 2022-03-16 Ongoing education performed.   ImpPlan 2022-03-16 The patient is tolerating radiation. Continue treatment as planned.   PhysExam 2022-03-16 Alert, no acute distress.   ProgNote 2022-03-16 Specific Site [ lumbar spine ] Changes from last week/visit? [ No ] Pain? [ No ] Fatigue? [ No ] Skin irritation? [ No ] Current medication regimen: [ No ] Need refills: [  ] Additional  Weekly Progress Notes [  ]    PatEd 2022-03-23 Ongoing education performed.   ImpPlan 2022-03-23 The patient is tolerating radiation. Continue treatment as planned.   PhysExam 2022-03-23 Alert, no acute distress.   PatEd 2022-08-27 Ongoing education performed.   ImpPlan 2022-08-27 The patient is tolerating radiation. Continue treatment as planned.   PhysExam 2022-08-27 Alert, no  acute distress.   PatEd 2022-09-22 Ongoing education performed.   ImpPlan 2022-09-22 The patient is tolerating radiation. Continue treatment as planned.   PhysExam 2022-09-22 Alert, no acute distress.   ProgNote 2022-09-22 Changes from last week/visit? [ No ] Pain? [ No ] Fatigue? [ Yes ] Skin irritation? [ No ] Nausea/Vomiting/Diarrhea? [ No ] Bladder issues?  [ No ] Vaginal or rectal bleeding? [ No ] Need refills: [ No ] Additional  Weekly Progress Notes [  ]

## 2022-10-20 ENCOUNTER — Ambulatory Visit (HOSPITAL_COMMUNITY)
Admission: RE | Admit: 2022-10-20 | Discharge: 2022-10-20 | Disposition: A | Discharge status: Home / Self Care | Payer: BC Managed Care – PPO | Source: Ambulatory Visit | Attending: Hematology and Oncology | Admitting: Hematology and Oncology

## 2022-10-20 DIAGNOSIS — C7951 Secondary malignant neoplasm of bone: Secondary | ICD-10-CM

## 2022-10-20 DIAGNOSIS — C55 Malignant neoplasm of uterus, part unspecified: Secondary | ICD-10-CM | POA: Diagnosis present

## 2022-10-20 MED ORDER — GADOBUTROL 1 MMOL/ML IV SOLN
5.5000 mL | Freq: Once | INTRAVENOUS | Status: AC | PRN
  Administered 2022-10-20: 5.5 mL via INTRAVENOUS

## 2022-10-21 ENCOUNTER — Telehealth: Payer: Self-pay | Admitting: Oncology

## 2022-10-21 NOTE — Telephone Encounter (Addendum)
Tammie Gilmore and scheduled her with Dr. Sondra Come on 10/26/22 for a 1:00 nurse eval and 1:30 reconsult.  She verbalized understanding and agreement.

## 2022-10-22 ENCOUNTER — Ambulatory Visit (HOSPITAL_COMMUNITY)
Admission: RE | Admit: 2022-10-22 | Discharge: 2022-10-22 | Disposition: A | Discharge status: Home / Self Care | Payer: BC Managed Care – PPO | Source: Ambulatory Visit | Attending: Hematology and Oncology | Admitting: Hematology and Oncology

## 2022-10-22 DIAGNOSIS — C55 Malignant neoplasm of uterus, part unspecified: Secondary | ICD-10-CM | POA: Diagnosis present

## 2022-10-22 DIAGNOSIS — R5381 Other malaise: Secondary | ICD-10-CM | POA: Insufficient documentation

## 2022-10-22 MED ORDER — IOHEXOL 300 MG/ML  SOLN
100.0000 mL | Freq: Once | INTRAMUSCULAR | Status: AC | PRN
  Administered 2022-10-22: 100 mL via INTRAVENOUS

## 2022-10-22 MED ORDER — SODIUM CHLORIDE (PF) 0.9 % IJ SOLN
INTRAMUSCULAR | Status: AC
  Filled 2022-10-22: qty 50

## 2022-10-25 ENCOUNTER — Other Ambulatory Visit (HOSPITAL_COMMUNITY): Payer: BC Managed Care – PPO

## 2022-10-25 NOTE — Progress Notes (Signed)
Location/Histology of Brain Tumor  MRI 10/20/2022  LINICAL DATA:  Metastatic disease evaluation. History of leiomyosarcoma. Painful posterior skull lump.   EXAM: MRI HEAD WITHOUT AND WITH CONTRAST   TECHNIQUE: Multiplanar, multiecho pulse sequences of the brain and surrounding structures were obtained without and with intravenous contrast.   CONTRAST:  5.79m GADAVIST GADOBUTROL 1 MMOL/ML IV SOLN   COMPARISON:  Skull radiographs 10/13/2022.  Head CT 12/24/2021.   FINDINGS: Brain: There is no evidence of an acute infarct, midline shift, or extra-axial fluid collection. The ventricles and sulci are normal. Scattered small T2 hyperintensities in the cerebral white matter bilaterally are nonspecific but compatible with minimal chronic small vessel ischemic disease. 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   An enhancing lesion in the left frontoparietal operculum measures 9 mm and demonstrates a small amount of susceptibility centrally which may reflect blood products or mineralization (series 16, image 91). There is mild surrounding edema without mass effect.   Vascular: Major intracranial vascular flow voids are preserved.   Skull and upper cervical spine: 3 cm destructive midline occipital skull lesion with associated mild dural thickening over both occipital poles and with tumor extension into the overlying scalp soft tissues. No involvement of the adjacent superior sagittal sinus.   Sinuses/Orbits: Unremarkable orbits. Mild mucosal thickening in the paranasal sinuses. Clear mastoid air cells.   Other: None.   IMPRESSION: 1. 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 2. 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    MRI 10-13-2022 INDINGS: Focal soft tissue swelling along the occipital seen on lateral view, not well assessed on  the frontal view, overlying an area of calvarial lytic lesion with permeative appearance measuring up to 3.2 cm in craniocaudal dimension.   IMPRESSION: Focal soft tissue swelling along the occipital region seen on lateral view may correspond to palpable area of clinical concern. Lytic calvarial lesion underlying this area measures 3.2 cm in craniocaudal dimension, suspicious for osseous metastasis in the setting of known malignancy. Recommend contrast-enhanced MRI or CT for further evaluation.  Past or anticipated interventions, if any, per medical oncology: {:18581}  Dose of Decadron, if applicable: {{:07622} Recent neurologic symptoms, if any:  Seizures: {:18581} Headaches: {:18581} Nausea: {:18581} Dizziness/ataxia: {:18581} Difficulty with hand coordination: {:18581} Focal numbness/weakness: {:18581} Visual deficits/changes: {:18581} Confusion/Memory deficits: {:18581}  Painful bone metastases at present, if any: {:18581}  SAFETY ISSUES: Prior radiation? {:18581} Pacemaker/ICD? {:18581} Possible current pregnancy? {:18581} Is the patient on methotrexate? {:18581}  Additional Complaints / other details: ***

## 2022-10-26 ENCOUNTER — Encounter: Payer: Self-pay | Admitting: Radiation Oncology

## 2022-10-26 ENCOUNTER — Other Ambulatory Visit: Payer: Self-pay | Admitting: Radiation Therapy

## 2022-10-26 ENCOUNTER — Ambulatory Visit
Admission: RE | Admit: 2022-10-26 | Discharge: 2022-10-26 | Disposition: A | Discharge status: Home / Self Care | Payer: BC Managed Care – PPO | Source: Ambulatory Visit | Attending: Radiation Oncology | Admitting: Radiation Oncology

## 2022-10-26 ENCOUNTER — Other Ambulatory Visit: Payer: Self-pay

## 2022-10-26 ENCOUNTER — Encounter (HOSPITAL_COMMUNITY)
Admission: RE | Admit: 2022-10-26 | Discharge: 2022-10-26 | Disposition: A | Discharge status: Home / Self Care | Payer: BC Managed Care – PPO | Source: Ambulatory Visit | Attending: Hematology and Oncology | Admitting: Hematology and Oncology

## 2022-10-26 ENCOUNTER — Other Ambulatory Visit: Payer: Self-pay | Admitting: Hematology and Oncology

## 2022-10-26 VITALS — BP 122/82 | HR 96 | Temp 97.8°F | Resp 18 | Ht 68.0 in | Wt 131.2 lb

## 2022-10-26 DIAGNOSIS — C541 Malignant neoplasm of endometrium: Secondary | ICD-10-CM | POA: Insufficient documentation

## 2022-10-26 DIAGNOSIS — C7931 Secondary malignant neoplasm of brain: Secondary | ICD-10-CM | POA: Insufficient documentation

## 2022-10-26 DIAGNOSIS — Z803 Family history of malignant neoplasm of breast: Secondary | ICD-10-CM | POA: Insufficient documentation

## 2022-10-26 DIAGNOSIS — C7951 Secondary malignant neoplasm of bone: Secondary | ICD-10-CM | POA: Insufficient documentation

## 2022-10-26 DIAGNOSIS — C55 Malignant neoplasm of uterus, part unspecified: Secondary | ICD-10-CM | POA: Insufficient documentation

## 2022-10-26 DIAGNOSIS — C775 Secondary and unspecified malignant neoplasm of intrapelvic lymph nodes: Secondary | ICD-10-CM | POA: Insufficient documentation

## 2022-10-26 DIAGNOSIS — C7800 Secondary malignant neoplasm of unspecified lung: Secondary | ICD-10-CM | POA: Insufficient documentation

## 2022-10-26 DIAGNOSIS — Z923 Personal history of irradiation: Secondary | ICD-10-CM | POA: Insufficient documentation

## 2022-10-26 DIAGNOSIS — Z79899 Other long term (current) drug therapy: Secondary | ICD-10-CM | POA: Insufficient documentation

## 2022-10-26 DIAGNOSIS — Z800 Family history of malignant neoplasm of digestive organs: Secondary | ICD-10-CM | POA: Insufficient documentation

## 2022-10-26 MED ORDER — LORAZEPAM 1 MG PO TABS: 1.0000 mg | ORAL_TABLET | ORAL | 0 refills | Status: DC | PRN

## 2022-10-26 MED ORDER — TECHNETIUM TC 99M MEDRONATE IV KIT
20.0000 | PACK | Freq: Once | INTRAVENOUS | Status: AC | PRN
  Administered 2022-10-26: 20.3 via INTRAVENOUS

## 2022-10-26 NOTE — Progress Notes (Signed)
Radiation Oncology         [phone] ________________________________  Outpatient Re-Consultation  Name: Tammie Gilmore MRN: [id number]  Date: 10/26/2022  DOB: [birth year]  [id number], [name], MD  [name], MD   REFERRING PHYSICIAN: [name], MD  DIAGNOSIS: The primary encounter diagnosis was Metastasis to bone [facility]). A diagnosis of Metastasis to brain [facility]) was also pertinent to this visit.  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 female who is accompanied by her husband. she is seen as a courtesy of Dr. Alvy Bimler for re-evaluation and an opinion concerning radiation therapy as part of management for her recent progression of uterine leiomyosarcoma with widespread metastatic disease. The patient was last seen in December 2023 for her final weekly treatment of radiation for her pelvic nodal metastasis. She did not present for her scheduled 1 month post-RT follow-up.   Since that time, the patient followed up with Dr. Elson Areas on 09/28/22. During which time, the patient was noted to have a palpable bony lesion on the back of her skull.   X-ray of the skull on 10/13/22 revealed an area of focal soft tissue swelling along the occipital region, overlying an area consisting of a calvarial lytic lesion with a permeative appearance, measuring up to 3.2 cm in craniocaudal dimension.   Accordingly, the patient followed up with Dr. Alvy Bimler on 10/14/22. Given her new metastatic disease to the skull, Dr. Alvy Bimler advised proceeding with an MRI of the brain and complete CT imaging to reassess her cancer burden. Dr. Alvy Bimler would also like her to get back on chemotherapy (pending imaging results).   MRI of the brain on 10/20/22 further revealed a 9 mm left frontoparietal mass with mild edema consistent with a solitary brain metastasis. MRI also redemonstrated the  3 cm midline occipital skull metastasis with extension into the overlying scalp soft tissues.   CT CAP on 10/22/22 also revealed: multiple new and enlarged bilateral pulmonary nodules consistent with worsened pulmonary metastatic disease; interval enlargement of a necrotic appearing right external iliac lymph node, consistent with worsened nodal metastatic disease; and multiple new small peritoneal nodules, consistent with peritoneal metastatic disease   PREVIOUS RADIATION THERAPY: Yes  Intent: Curative Radiation Treatment Dates: 09/14/2022 through 09/24/2022 Site Technique Total Dose (Gy) Dose per Fx (Gy) Completed Fx Beam Energies  Rt External Iliac Node: Pelvis IMRT 50/50 10 5/5 6XFFF   Intent: Palliative Radiation Treatment Dates: 03/10/2022 through 03/23/2022 Site Technique Total Dose (Gy) Dose per Fx (Gy) Completed Fx Beam Energies  Lumbar Spine: Spine 3D 30/30 3 10/10 10X, 15X    PAST MEDICAL HISTORY:  Past Medical History:  Diagnosis Date   History of radiation therapy    Lumbar Spine- 03/10/22-03/23/22- Dr. Gery Pray   History of radiation therapy    SBRT to iliac nodes; 09/14/22-09/24/22 Dr. Gery Pray   Hypoglycemia    occasional episodes of hypoglycemia   IBS (irritable bowel syndrome)     PAST SURGICAL HISTORY: Past Surgical History:  Procedure Laterality Date   HERNIA REPAIR  1994   left inguinal, with mesh   IR BONE TUMOR(S)RF ABLATION  08/26/2022   IR IMAGING GUIDED PORT INSERTION  08/13/2021   IR KYPHO LUMBAR INC FX REDUCE BONE BX UNI/BIL CANNULATION INC/IMAGING  08/26/2022   IR RADIOLOGIST EVAL & MGMT  08/03/2022   IR RADIOLOGIST EVAL & MGMT  09/09/2022  FAMILY HISTORY:  Family History  Problem Relation Age of Onset   Heart attack Mother    Heart disease Mother    Endometriosis Mother    Heart disease Father    Heart attack Father    Cancer - Colon Neg Hx    Breast cancer Neg Hx    Cancer Neg Hx    Ovarian cancer Neg Hx    Uterine cancer Neg  Hx    Pancreatic cancer Neg Hx    Pancreatic disease Neg Hx    Prostate cancer Neg Hx     SOCIAL HISTORY:  Social History   Tobacco Use   Smoking status: Never   Smokeless tobacco: Never  Vaping Use   Vaping Use: Never used  Substance Use Topics   Alcohol use: Never   Drug use: Never    ALLERGIES:  Allergies  Allergen Reactions   Doxycycline Nausea Only and Other (See Comments)    Dizziness    MEDICATIONS:  Current Outpatient Medications  Medication Sig Dispense Refill   LORazepam (ATIVAN) 1 MG tablet Take 1 tablet (1 mg total) by mouth as needed for anxiety. 1 hour prior to MRI, radiation planning and treatment 15 tablet 0   acetaminophen (TYLENOL) 500 MG tablet Take 1,000 mg by mouth every 6 (six) hours as needed for moderate pain or headache.     calcium carbonate (TUMS - DOSED IN MG ELEMENTAL CALCIUM) 500 MG chewable tablet Chew 1 tablet by mouth 2 (two) times daily.     cholecalciferol (VITAMIN D3) 25 MCG (1000 UNIT) tablet Take 2,000 Units by mouth daily.     estradiol (ESTRACE) 0.1 MG/GM vaginal cream PLACE FINGER TIP SIZE AMOUNT OF CREAM AND INSERT SLIGHTLY PAST THE VAGINAL ENTRANCE 3 TIMES DAILY (Patient not taking: Reported on 08/03/2022) 126 g 4   lidocaine (XYLOCAINE) 2 % solution SMARTSIG:By Mouth     lidocaine-prilocaine (EMLA) cream Apply to affected area once 30 g 3   loratadine (CLARITIN) 10 MG tablet Take 10 mg by mouth daily as needed (for bone aches).     LORazepam (ATIVAN) 0.5 MG tablet Take 1 tablet (0.5 mg total) by mouth 2 (two) times daily as needed for anxiety. (Patient not taking: Reported on 04/26/2022) 30 tablet 0   magic mouthwash (nystatin, diphenhydrAMINE, alum & mag hydroxide) suspension mixture Swish and spit 5 mLs 4 (four) times daily as needed for mouth pain. 240 mL 0   metoprolol succinate (TOPROL XL) 25 MG 24 hr tablet Take 1 tablet (25 mg total) by mouth at bedtime. 30 tablet 6   ondansetron (ZOFRAN) 8 MG tablet Take 1 tablet (8 mg total)  by mouth every 8 (eight) hours as needed. (Patient not taking: Reported on 08/03/2022) 30 tablet 1   prochlorperazine (COMPAZINE) 10 MG tablet Take 1 tablet (10 mg total) by mouth every 6 (six) hours as needed (Nausea or vomiting). 90 tablet 1   senna (SENOKOT) 8.6 MG TABS tablet Take 2 tablets by mouth at bedtime.     No current facility-administered medications for this encounter.    REVIEW OF SYSTEMS:  A 10+ POINT REVIEW OF SYSTEMS WAS OBTAINED including neurology, dermatology, psychiatry, cardiac, respiratory, lymph, extremities, GI, GU, musculoskeletal, constitutional, reproductive, HEENT.  She reports tenderness along the occipital skull region.  This is particularly bothersome when trying to sleep at night.  She does require sleeping on her side in light of this issue.  She denies any blurry vision or double vision.  She has some mild  headaches.   PHYSICAL EXAM:  height is '5\' 8"'$  (1.727 m) and weight is 131 lb 3.2 oz (59.5 kg). Her temperature is 97.8 F (36.6 C). Her blood pressure is 122/82 and her pulse is 96. Her respiration is 18 and oxygen saturation is 100%.   General: Alert and oriented, in no acute distress HEENT: Head is normocephalic. Extraocular movements are intact.  Palpable and visible lesion along the right occipital skull area.  Measures approximately 3 x 3 cm.  No skin erosion or erythema Neck: Neck is supple, no palpable cervical or supraclavicular lymphadenopathy. Heart: Regular in rate and rhythm with no murmurs, rubs, or gallops. Chest: Clear to auscultation bilaterally, with no rhonchi, wheezes, or rales. Abdomen: Soft, nontender, nondistended, with no rigidity or guarding. Extremities: No cyanosis or edema. Lymphatics: see Neck Exam Skin: No concerning lesions. Musculoskeletal: symmetric strength and muscle tone throughout. Neurologic: Cranial nerves II through XII are grossly intact. No obvious focalities. Speech is fluent. Coordination is intact. Psychiatric:  Judgment and insight are intact. Affect is appropriate.   ECOG = 1  0 - Asymptomatic (Fully active, able to carry on all predisease activities without restriction)  1 - Symptomatic but completely ambulatory (Restricted in physically strenuous activity but ambulatory and able to carry out work of a light or sedentary nature. For example, light housework, office work)  2 - Symptomatic, <50% in bed during the day (Ambulatory and capable of all self care but unable to carry out any work activities. Up and about more than 50% of waking hours)  3 - Symptomatic, >50% in bed, but not bedbound (Capable of only limited self-care, confined to bed or chair 50% or more of waking hours)  4 - Bedbound (Completely disabled. Cannot carry on any self-care. Totally confined to bed or chair)  5 - Death   Eustace Pen MM, Creech RH, Tormey DC, et al. [phone]). "Toxicity and response criteria of the [facility] Group". [place] Oncol. 5 (6): 649-55  LABORATORY DATA:  Lab Results  Component Value Date   WBC 3.9 (L) 09/28/2022   HGB 12.7 09/28/2022   HCT 38.2 09/28/2022   MCV 96.2 09/28/2022   PLT 165 09/28/2022   NEUTROABS 2.8 09/28/2022   Lab Results  Component Value Date   NA 142 09/28/2022   K 3.7 09/28/2022   CL 109 09/28/2022   CO2 26 09/28/2022   GLUCOSE 135 (H) 09/28/2022   BUN 14 09/28/2022   CREATININE 0.62 09/28/2022   CALCIUM 8.9 09/28/2022      RADIOGRAPHY: CT CHEST ABDOMEN PELVIS W CONTRAST  Result Date: 10/23/2022 CLINICAL DATA:  Cervical cancer, leiomyosarcoma, status post chemotherapy, assess treatment response * Tracking Code: BO * EXAM: CT CHEST, ABDOMEN, AND PELVIS WITH CONTRAST TECHNIQUE: Multidetector CT imaging of the chest, abdomen and pelvis was performed following the standard protocol during bolus administration of intravenous contrast. RADIATION DOSE REDUCTION: This exam was performed according to the departmental dose-optimization program which includes  automated exposure control, adjustment of the mA and/or kV according to patient size and/or use of iterative reconstruction technique. CONTRAST:  130m OMNIPAQUE IOHEXOL 300 MG/ML  SOLN COMPARISON:  08/20/2022 FINDINGS: CT CHEST FINDINGS Cardiovascular: Right chest port catheter. Normal heart size. No pericardial effusion. Mediastinum/Nodes: No enlarged mediastinal, hilar, or axillary lymph nodes. Thyroid gland, trachea, and esophagus demonstrate no significant findings. Lungs/Pleura: Interval enlargement of a nodule of the anterior right upper lobe measuring 1.0 x 0.8 cm, previously no greater than 0.3 cm (series 7, image 46).  Interval enlargement of a nodule of the medial right upper lobe measuring 1.5 x 1.0 cm, previously no greater than 0.5 cm (series 7, image 41). Multiple new nodules, for example a 0.8 x 0.6 cm nodule of the medial left lower lobe (series 7, image 111). No pleural effusion or pneumothorax. Musculoskeletal: No chest wall abnormality. No acute osseous findings. CT ABDOMEN PELVIS FINDINGS Hepatobiliary: No solid liver abnormality is seen. No gallstones, gallbladder wall thickening, or biliary dilatation. Pancreas: Unremarkable. No pancreatic ductal dilatation or surrounding inflammatory changes. Spleen: Normal in size without significant abnormality. Adrenals/Urinary Tract: Adrenal glands are unremarkable. Kidneys are normal, without renal calculi, solid lesion, or hydronephrosis. Bladder is unremarkable. Stomach/Bowel: Stomach is within normal limits. Appendix appears normal. No evidence of bowel wall thickening, distention, or inflammatory changes. Vascular/Lymphatic: No significant vascular findings are present. Interval enlargement of a necrotic appearing right external iliac lymph node measuring 2.0 x 1.6 cm, previously 1.5 x 1.4 cm (series 2, image 99). Reproductive: Status post hysterectomy. Other: No abdominal wall hernia or abnormality. Trace ascites in the low pelvis (series 2, image  109). Probable prior omentectomy. Multiple new small peritoneal nodules, for example in the midline ventral abdomen measuring 1.7 x 1.2 cm (series 2, image 85) and in the ventral right lower quadrant measuring 1.0 x 0.9 cm (series 2, image 89). Musculoskeletal: No acute osseous findings. Interval vertebral cement augmentation of a pathologic fracture of L3 (series 6, image 104). IMPRESSION: 1. Multiple new and enlarged bilateral pulmonary nodules, consistent with worsened pulmonary metastatic disease. 2. Interval enlargement of a necrotic appearing right external iliac lymph node, consistent with worsened nodal metastatic disease. 3. Multiple new small peritoneal nodules, consistent with peritoneal metastatic disease. 4. Trace ascites in the low pelvis, presumed malignant. 5. Interval vertebral cement augmentation of a pathologic fracture of L3. 6. Status post hysterectomy. Electronically Signed   By: Delanna Ahmadi M.D.   On: 10/23/2022 15:55   MR Brain W Wo Contrast  Result Date: 10/21/2022 CLINICAL DATA:  Metastatic disease evaluation. History of leiomyosarcoma. Painful posterior skull lump. EXAM: MRI HEAD WITHOUT AND WITH CONTRAST TECHNIQUE: Multiplanar, multiecho pulse sequences of the brain and surrounding structures were obtained without and with intravenous contrast. CONTRAST:  5.54m GADAVIST GADOBUTROL 1 MMOL/ML IV SOLN COMPARISON:  Skull radiographs 10/13/2022.  Head CT 12/24/2021. FINDINGS: Brain: There is no evidence of an acute infarct, midline shift, or extra-axial fluid collection. The ventricles and sulci are normal. Scattered small T2 hyperintensities in the cerebral white matter bilaterally are nonspecific but compatible with minimal chronic small vessel ischemic disease. 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 An enhancing lesion in the left frontoparietal operculum measures 9 mm and demonstrates a small amount of susceptibility centrally which may  reflect blood products or mineralization (series 16, image 91). There is mild surrounding edema without mass effect. Vascular: Major intracranial vascular flow voids are preserved. Skull and upper cervical spine: 3 cm destructive midline occipital skull lesion with associated mild dural thickening over both occipital poles and with tumor extension into the overlying scalp soft tissues. No involvement of the adjacent superior sagittal sinus. Sinuses/Orbits: Unremarkable orbits. Mild mucosal thickening in the paranasal sinuses. Clear mastoid air cells. Other: None. IMPRESSION: 1. 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 2. 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 Electronically Signed   By: ALogan BoresM.D.   On: 10/21/2022 09:50  DG Skull 1-3 Views  Result Date: 10/13/2022 CLINICAL DATA:  History of leiomyosarcoma with new painful palpable area in the right posterior skull. No known injury. EXAM: SKULL - 1-3 VIEW COMPARISON:  CT head dated 12/24/2021 FINDINGS: Focal soft tissue swelling along the occipital seen on lateral view, not well assessed on the frontal view, overlying an area of calvarial lytic lesion with permeative appearance measuring up to 3.2 cm in craniocaudal dimension. IMPRESSION: Focal soft tissue swelling along the occipital region seen on lateral view may correspond to palpable area of clinical concern. Lytic calvarial lesion underlying this area measures 3.2 cm in craniocaudal dimension, suspicious for osseous metastasis in the setting of known malignancy. Recommend contrast-enhanced MRI or CT for further evaluation. These results will be called to the ordering clinician or representative by the Radiologist Assistant, and communication documented in the PACS or [employer]. Electronically Signed   By: Darrin Nipper M.D.   On: 10/13/2022 16:42      IMPRESSION: Uterine leiomyosarcoma with  recent progression of metastatic disease to the brain, skull, lymph nodes, and lungs   She would be a good candidate for short course of palliative radiation therapy directed at her skull metastasis along the occipital region.  I discussed the overall course of treatment anticipated  side effects and potential toxicities of this radiation therapy.  She appears to understand and wishes to proceed with planned course of treatment.  We also discussed that she would likely be a good candidate for stereotactic radiosurgery for her solitary left frontoparietal brain metastasis.  She would like to proceed with this treatment will be set up with Dr. Isidore Moos in the near future.  In the meantime she will be set up for a 3T MRI.  She will meet with Dr. Alvy Bimler later this week.  Anticipate she will need to restart systemic therapy in light of  recent CT body results.   PLAN: She will return tomorrow morning for CT simulation concerning her skull metastasis.  Treatments to began January 22.  Anticipate 10 treatments.      ------------------------------------------------  Blair Promise, PhD, MD  This document serves as a record of services personally performed by Gery Pray, MD. It was created on his behalf by Roney Mans, a trained medical scribe. The creation of this record is based on the scribe's personal observations and the provider's statements to them. This document has been checked and approved by the attending provider.

## 2022-10-27 ENCOUNTER — Ambulatory Visit
Admission: RE | Admit: 2022-10-27 | Discharge: 2022-10-27 | Disposition: A | Discharge status: Home / Self Care | Payer: BC Managed Care – PPO | Source: Ambulatory Visit | Attending: Radiation Oncology | Admitting: Radiation Oncology

## 2022-10-27 ENCOUNTER — Ambulatory Visit (HOSPITAL_COMMUNITY): Payer: BC Managed Care – PPO | Attending: Physical Therapy | Admitting: Physical Therapy

## 2022-10-27 DIAGNOSIS — C7931 Secondary malignant neoplasm of brain: Secondary | ICD-10-CM | POA: Insufficient documentation

## 2022-10-27 DIAGNOSIS — C55 Malignant neoplasm of uterus, part unspecified: Secondary | ICD-10-CM

## 2022-10-27 DIAGNOSIS — C7951 Secondary malignant neoplasm of bone: Secondary | ICD-10-CM

## 2022-10-27 DIAGNOSIS — Z510 Encounter for antineoplastic radiation therapy: Secondary | ICD-10-CM | POA: Insufficient documentation

## 2022-10-27 NOTE — Progress Notes (Signed)
Location/Histology of Brain Tumor:  MRI IMPRESSION: 1. 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 2. 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     Electronically Signed   By: Logan Bores M.D.   On: 10/21/2022 09:50  Patient presented with symptoms of: found on MRI  Past or anticipated interventions, if any, per neurosurgery: none in EMR  Past or anticipated interventions, if any, per medical oncology:  ASSESSMENT & PLAN:  Uterine leiomyosarcoma ([place]) I have reviewed her skull x-ray with the patient and her husband Unfortunately, she has new metastatic disease to her skull We discussed the rationale of ordering bone scan, MRI of the brain and complete CT imaging to reassess her cancer burden The patient will need to go back on chemotherapy again We discussed overall poor prognosis due to rapid disease relapse   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[facility]) She is not symptomatic I will order CT imaging of the chest to assess   Metastasis to bone [facility]) She has now evidence of continued metastatic disease to her skull I will order a bone scan to assess She has prescription narcotics to take as needed for pain   Goals of care, counseling/discussion We discussed goals of care briefly        Orders Placed This Encounter  Procedures   NM Bone Scan Whole Body      Standing Status:   Future      Standing Expiration Date:   10/14/2023      Order Specific Question:   If indicated for the ordered procedure, I authorize the administration of a radiopharmaceutical per Radiology protocol      Answer:   Yes      Order Specific Question:   Is the patient pregnant?      Answer:   No      Order Specific Question:   Preferred imaging location?      Answer:   [facility]   MR Brain W Wo Contrast      Standing Status:   Future      Standing Expiration Date:   10/14/2023      Order  Specific Question:   If indicated for the ordered procedure, I authorize the administration of contrast media per Radiology protocol      Answer:   Yes      Order Specific Question:   What is the patient's sedation requirement?      Answer:   No Sedation      Order Specific Question:   Does the patient have a pacemaker or implanted devices?      Answer:   No      Order Specific Question:   Use SRS Protocol?      Answer:   No      Order Specific Question:   Preferred imaging location?      Answer:   [facility] (table limit [phone])     All questions were answered. The patient knows to call the clinic with any problems, questions or concerns. The total time spent in the appointment was 30 minutes encounter with patients including review of chart and various tests results, discussions about plan of care and coordination of care plan   [name], MD 10/14/2022 11:36 AM  10-28-22 Dr. Alvy Bimler Skull lesion Recommend trial of low-dose dexamethasone   Metastasis to brain [facility]) I recommend low-dose dexamethasone She is arranged  to be seen by radiation oncologist   Goals of care, counseling/discussion We have significant discussions about goals of care She has a very aggressive course of disease Her prognosis is poor overall Ultimately, the patient is not ready for palliative care and would like to pursue further treatment  Dose of Decadron, if applicable: '4mg'$  dose taper  Recent neurologic symptoms, if any:  Seizures: none Headaches: they have lessened Nausea: intermittent mild nausea Dizziness/ataxia: mild lightheadedness Difficulty with hand coordination:  Focal numbness/weakness: none Visual deficits/changes: none Confusion/Memory deficits: none  Painful bone metastases at present, if any: none  SAFETY ISSUES: Prior radiation? Yes, getting radiation now Pacemaker/ICD? none Possible current pregnancy? none Is the patient on methotrexate? none  Additional Complaints /  other details: no major questions or concerns at this time.   Vitals:   11/08/22 0815  BP: (!) 134/90  Pulse: 100  Resp: 18  Temp: (!) 97.5 F (36.4 C)  SpO2: 100%

## 2022-10-28 ENCOUNTER — Inpatient Hospital Stay: Payer: BC Managed Care – PPO

## 2022-10-28 ENCOUNTER — Encounter: Payer: Self-pay | Admitting: Oncology

## 2022-10-28 ENCOUNTER — Ambulatory Visit: Payer: Self-pay | Admitting: Radiation Oncology

## 2022-10-28 ENCOUNTER — Other Ambulatory Visit: Payer: Self-pay

## 2022-10-28 ENCOUNTER — Telehealth: Payer: Self-pay

## 2022-10-28 ENCOUNTER — Encounter: Payer: Self-pay | Admitting: Hematology and Oncology

## 2022-10-28 ENCOUNTER — Inpatient Hospital Stay (HOSPITAL_BASED_OUTPATIENT_CLINIC_OR_DEPARTMENT_OTHER): Payer: BC Managed Care – PPO | Admitting: Hematology and Oncology

## 2022-10-28 VITALS — BP 149/77 | HR 89 | Temp 98.3°F | Resp 18 | Ht 68.0 in | Wt 130.6 lb

## 2022-10-28 DIAGNOSIS — C55 Malignant neoplasm of uterus, part unspecified: Secondary | ICD-10-CM

## 2022-10-28 DIAGNOSIS — M899 Disorder of bone, unspecified: Secondary | ICD-10-CM

## 2022-10-28 DIAGNOSIS — C7931 Secondary malignant neoplasm of brain: Secondary | ICD-10-CM | POA: Diagnosis not present

## 2022-10-28 DIAGNOSIS — I427 Cardiomyopathy due to drug and external agent: Secondary | ICD-10-CM

## 2022-10-28 DIAGNOSIS — Z7189 Other specified counseling: Secondary | ICD-10-CM

## 2022-10-28 LAB — CBC WITH DIFFERENTIAL (CANCER CENTER ONLY)
Abs Immature Granulocytes: 0.01 10*3/uL (ref 0.00–0.07)
Basophils Absolute: 0 10*3/uL (ref 0.0–0.1)
Basophils Relative: 1 %
Eosinophils Absolute: 0.1 10*3/uL (ref 0.0–0.5)
Eosinophils Relative: 3 %
HCT: 37.2 % (ref 36.0–46.0)
Hemoglobin: 12.6 g/dL (ref 12.0–15.0)
Immature Granulocytes: 0 %
Lymphocytes Relative: 18 %
Lymphs Abs: 0.7 10*3/uL (ref 0.7–4.0)
MCH: 30.8 pg (ref 26.0–34.0)
MCHC: 33.9 g/dL (ref 30.0–36.0)
MCV: 91 fL (ref 80.0–100.0)
Monocytes Absolute: 0.3 10*3/uL (ref 0.1–1.0)
Monocytes Relative: 9 %
Neutro Abs: 2.5 10*3/uL (ref 1.7–7.7)
Neutrophils Relative %: 69 %
Platelet Count: 202 10*3/uL (ref 150–400)
RBC: 4.09 MIL/uL (ref 3.87–5.11)
RDW: 13.3 % (ref 11.5–15.5)
WBC Count: 3.6 10*3/uL — ABNORMAL LOW (ref 4.0–10.5)
nRBC: 0 % (ref 0.0–0.2)

## 2022-10-28 LAB — CMP (CANCER CENTER ONLY)
ALT: 13 U/L (ref 0–44)
AST: 14 U/L — ABNORMAL LOW (ref 15–41)
Albumin: 3.6 g/dL (ref 3.5–5.0)
Alkaline Phosphatase: 64 U/L (ref 38–126)
Anion gap: 6 (ref 5–15)
BUN: 14 mg/dL (ref 6–20)
CO2: 29 mmol/L (ref 22–32)
Calcium: 9.1 mg/dL (ref 8.9–10.3)
Chloride: 106 mmol/L (ref 98–111)
Creatinine: 0.77 mg/dL (ref 0.44–1.00)
GFR, Estimated: >60 mL/min (ref >60)
Glucose, Bld: 88 mg/dL (ref 70–99)
Potassium: 4 mmol/L (ref 3.5–5.1)
Sodium: 141 mmol/L (ref 135–145)
Total Bilirubin: 0.3 mg/dL (ref 0.3–1.2)
Total Protein: 5.8 g/dL — ABNORMAL LOW (ref 6.5–8.1)

## 2022-10-28 MED ORDER — HEPARIN SOD (PORK) LOCK FLUSH 100 UNIT/ML IV SOLN
500.0000 [IU] | Freq: Once | INTRAVENOUS | Status: AC
  Administered 2022-10-28: 500 [IU]

## 2022-10-28 MED ORDER — SODIUM CHLORIDE 0.9% FLUSH
10.0000 mL | Freq: Once | INTRAVENOUS | Status: AC
  Administered 2022-10-28: 10 mL

## 2022-10-28 MED ORDER — DEXAMETHASONE 4 MG PO TABS: 2.0000 mg | ORAL_TABLET | Freq: Every day | ORAL | Status: DC

## 2022-10-28 NOTE — Telephone Encounter (Signed)
Called and left a message asking her to call the office back.. Echo scheduled at [facility] on 1/26 at 8 am, arrive at [place] to main entrance of the hospital.

## 2022-10-28 NOTE — Assessment & Plan Note (Signed)
We have significant discussions about goals of care She has a very aggressive course of disease Her prognosis is poor overall Ultimately, the patient is not ready for palliative care and would like to pursue further treatment

## 2022-10-28 NOTE — Assessment & Plan Note (Signed)
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 Her cardiac function has improved since discontinuation of treatment We discussed the risk and benefits of resuming chemotherapy with gemcitabine and Taxotere With her previous imaging, the only site of disease was in the retroperitoneal lymph node area and that area was irradiated Unfortunately, she did not respond to radiation in that location and now she has metastatic disease in her peritoneum and lungs I am worrisome that she has chemo resistant disease and resuming chemotherapy with gemcitabine and Taxotere may not be helpful We discussed the role of molecular profiling and she is in agreement to get her tissue sent for molecular profiling We discussed the role of second opinion and I will arrange for her to be seen by our GYN oncologist  We have extensive discussion about her third line of therapy Previously, her doxorubicin was discontinued due to cardiomyopathy Even though she has fully recovered, there is 6% risk of cardiomyopathy associated with trabectedin, and her risk would be higher due to prior documented chemotherapy associated cardiomyopathy We discussed the risk, benefits, side effects of trabectedin versus gemcitabine/Taxotere versus other treatment Ultimately, she is undecided She will call me with her decision tomorrow

## 2022-10-28 NOTE — Assessment & Plan Note (Signed)
Recommend trial of low-dose dexamethasone

## 2022-10-28 NOTE — Progress Notes (Signed)
Requested Caris testing on accession [id number] with [facility] via email.

## 2022-10-28 NOTE — Assessment & Plan Note (Signed)
I recommend low-dose dexamethasone She is arranged to be seen by radiation oncologist

## 2022-10-28 NOTE — Progress Notes (Signed)
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[facility]) 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 Her cardiac function has improved since discontinuation of treatment We discussed the risk and benefits of resuming chemotherapy with gemcitabine and Taxotere With her previous imaging, the only site of disease was in the retroperitoneal lymph node area and that area was irradiated Unfortunately, she did not respond to radiation in that location and now she has metastatic disease in her peritoneum and lungs I am worrisome that she has chemo resistant disease and resuming chemotherapy with gemcitabine and Taxotere may not be helpful We discussed the role of molecular profiling and she is in agreement to get her tissue sent for molecular profiling We discussed the role of second opinion and I will arrange for her to be seen by our GYN oncologist  We have extensive discussion about her third line of therapy Previously, her doxorubicin was discontinued due to cardiomyopathy Even though she has fully recovered, there is 6% risk of cardiomyopathy associated with trabectedin, and her risk would be higher due to prior documented chemotherapy associated cardiomyopathy We discussed the risk, benefits, side effects of trabectedin versus gemcitabine/Taxotere versus other  treatment Ultimately, she is undecided She will call me with her decision tomorrow  Skull lesion Recommend trial of low-dose dexamethasone  Metastasis to brain [facility]) I recommend low-dose dexamethasone She is arranged to be seen by radiation oncologist  Goals of care, counseling/discussion We have significant discussions about goals of care She has a very aggressive course of disease Her prognosis is poor overall Ultimately, the patient is not ready for palliative care and would like to pursue further treatment  Orders Placed This Encounter  Procedures   ECHOCARDIOGRAM COMPLETE    Standing Status:   Future    Standing Expiration Date:   10/29/2023    Order Specific Question:   Where should this test be performed    Answer:   [facility]    Order Specific Question:   Perflutren DEFINITY (image enhancing agent) should be administered unless hypersensitivity or allergy exist    Answer:   Administer Perflutren    Order Specific Question:   Is a special reader required? (athlete or structural heart)    Answer:   No    Order Specific Question:   Does this study need to be read by the Structural team/Level 3 readers?    Answer:   No    Order Specific Question:   Reason for exam-Echo    Answer:   Chemo  Z09    All questions were answered. The patient knows to call the clinic with any problems, questions or concerns. The total time spent in the appointment was 80 minutes encounter with patients including review of chart and various tests results, discussions about plan of care and coordination of care plan   [name], MD 10/28/2022 4:02 PM  INTERVAL HISTORY: Please see below for problem oriented charting. she  returns for review of test result and discussion about further plan of care She has intermittent discomfort at the site of her skeletal metastasis at the back of her skull We spent over 80 minutes reviewing imaging study and discussed various treatment  options  REVIEW OF SYSTEMS:   Constitutional: Denies fevers, chills or abnormal weight loss Eyes: Denies blurriness of vision Ears, nose, mouth, throat, and face: Denies mucositis or sore throat Respiratory: Denies cough, dyspnea or wheezes Cardiovascular: Denies palpitation, chest discomfort or lower extremity swelling Gastrointestinal:  Denies nausea, heartburn or change in bowel habits Skin: Denies abnormal skin rashes Lymphatics: Denies new lymphadenopathy or easy bruising Neurological:Denies numbness, tingling or new weaknesses Behavioral/Psych: Mood is stable, no new changes  All other systems were reviewed with the patient and are negative.  I have reviewed the past medical history, past surgical history, social history and family history with the patient and they are unchanged from previous note.  ALLERGIES:  is allergic to doxycycline.  MEDICATIONS:  Current Outpatient Medications  Medication Sig Dispense Refill   dexamethasone (DECADRON) 4 MG tablet Take 0.5 tablets (2 mg total) by mouth daily.     acetaminophen (TYLENOL) 500 MG tablet Take 1,000 mg by mouth every 6 (six) hours as needed for moderate pain or headache.     calcium carbonate (TUMS - DOSED IN MG ELEMENTAL CALCIUM) 500 MG chewable tablet Chew 1 tablet by mouth 2 (two) times daily.     cholecalciferol (VITAMIN D3) 25 MCG (1000 UNIT) tablet Take 2,000 Units by mouth daily.     estradiol (ESTRACE) 0.1 MG/GM vaginal cream PLACE FINGER TIP SIZE AMOUNT OF CREAM AND INSERT SLIGHTLY PAST THE VAGINAL ENTRANCE 3 TIMES DAILY (Patient not taking: Reported on 08/03/2022) 126 g 4   lidocaine (XYLOCAINE) 2 % solution SMARTSIG:By Mouth     lidocaine-prilocaine (EMLA) cream Apply to affected area once 30 g 3   loratadine (CLARITIN) 10 MG tablet Take 10 mg by mouth daily as needed (for bone aches).     LORazepam (ATIVAN) 0.5 MG tablet Take 1 tablet (0.5 mg total) by mouth 2 (two) times daily as needed for anxiety. (Patient not  taking: Reported on 04/26/2022) 30 tablet 0   LORazepam (ATIVAN) 1 MG tablet Take 1 tablet (1 mg total) by mouth as needed for anxiety. 1 hour prior to MRI, radiation planning and treatment 15 tablet 0   magic mouthwash (nystatin, diphenhydrAMINE, alum & mag hydroxide) suspension mixture Swish and spit 5 mLs 4 (four) times daily as needed for mouth pain. 240 mL 0   metoprolol succinate (TOPROL XL) 25 MG 24 hr tablet Take 1 tablet (25 mg total) by mouth at bedtime. 30 tablet 6   ondansetron (ZOFRAN) 8 MG tablet Take 1 tablet (8 mg total) by mouth every 8 (eight) hours as needed. (Patient not taking: Reported on 08/03/2022) 30 tablet 1   prochlorperazine (COMPAZINE) 10 MG tablet Take 1 tablet (10 mg total) by mouth every 6 (six) hours as needed (Nausea or vomiting). 90 tablet 1   senna (SENOKOT) 8.6 MG TABS tablet Take 2 tablets by mouth at bedtime.     No current facility-administered medications for this visit.    SUMMARY OF ONCOLOGIC HISTORY: Oncology History  Uterine leiomyosarcoma ([place])  06/11/2021 Imaging   1. 9.5 x 7.6 x 9.0 cm complex, partially necrotic, mass involving the lower uterine segment/ cervix. No obvious direct extension into the parametrium.  2. 9 mm left pelvic sidewall lymph node is partially necrotic and  worrisome for metastatic adenopathy.  3. No findings for abdominal omental or peritoneal surface disease or adenopathy.  4. Tiny low-attenuation lesion in the pancreatic head, likely benign cyst but attention on follow-up scans is suggested.  5. 2.9 cm fundal fibroid.    06/19/2021 Pathology Results   FINAL MICROSCOPIC DIAGNOSIS:   A. UTERINE, CERVICAL MASS, BIOPSY:  - Spindle cell malignancy.  - See comment.   COMMENT:  The biopsies consist of endocervical mucosa with stromal edema and one biopsy fragment has a microscopic focus with atypical spindle cells consistent with poorly differentiated malignancy.  The differential  includes a spindle cell malignancy such as  sarcomatoid carcinoma and leiomyosarcoma.  Mullerian adenosarcoma is also a consideration but considered less likely   06/23/2021 Imaging   MR pelvis  10 cm uterine mass with central necrosis, which is centered in the cervix and lower uterine segment. Right parametrial involvement is seen as well as suspected invasion of the distal rectum. Differential diagnosis includes cervical carcinoma and uterine leiomyosarcoma.   Mild bilateral iliac lymphadenopathy, highly suspicious for metastatic disease.   2.9 cm subserosal fibroid in the posterior fundus.   Normal appearance of both ovaries.     06/29/2021 PET scan   1. Hypermetabolic necrotic cervical/uterine mass with bilateral external iliac hypermetabolic lymph nodes. No evidence of distant metastatic disease. 2. 1.5 cm low-attenuation left thyroid nodule. Recommend thyroid ultrasound. (Ref: J Am Coll Radiol. 2015 Feb;12(2): 143-50).   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, pleomorphic, and myxoid areas and associated necrosis (~20%) - Tumor based in cervix and also involves lower uterine segment - Cervicovaginal margin involved by focal invasive leiomyosarcoma (3:00-5:00, A10) as well as tumor in lymphovascular spaces - Leiomyosarcoma involves right and left parametrial tissue and extends to parametrial margins - Extensive lymphovascular space invasion present, including in uterus and parametria - See synoptic report and comment   Other findings: - Leiomyomata with hyalinization, size up to 3.0 cm - Ovaries and fallopian tubes with no parenchymal involvement by leiomyosarcoma identified, although adnexal lymphovascular space invasion is present   B: Lymph nodes, right pelvic, lymphadenectomy - One of four lymph nodes positive for metastatic leiomyosarcoma  (1/4), with extracapsular extension present   C: Lymph nodes, left pelvic, lymphadenectomy - One of four lymph nodes positive for metastatic leiomyosarcoma (1/4), with extracapsular extension present  Immunohistochemical stains are performed on block A11, and demonstrate that the tumor is positive for desmin and CD10, with SMA staining the majority of the spindle cell component but largely negative in the epithelioid / pleomorphic component. OSCAR, pancytokeratin AE1/AE3, HMB45, and PR appear negative in the tumor. ER shows patchy weak staining and myogenin stains rare cells. Block A23 also shows positive desmin and negative OSCAR pancytokeratin. Overall, the findings are most consistent with leiomyosarcoma, with extensive areas that are myxoid, epithelioid, and pleomorphic as well as more typical spindle cell areas within the overall high grade tumor (grade 3 / 3). The tumor is staged as pT2b (involves other pelvic tissues) given the parametrial involvement and pN1 for FIGO stage IIIC.    07/17/2021 Surgery   Date of Surgery: 07/17/21  Preoperative Diagnosis: High Grade Uterine Sarcoma  Postoperative Diagnosis: Same  Procedure(s): Bilateral - RADICAL ABDOMINAL HYSTER, W/BIL TOTAL PELVIC LYMPHADENECTOMY & PARA-AORTIC LYMPH NODE BX W/WO REM TUBE/OVAR VAGINAL HYSTERECTOMY, FOR UTERUS 250 G OR LESS; WITH REPAIR  OF ENTEROCELE COLECTOMY, PARTIAL; WITH COLOPROCTOSTOMY (LOW PELVIC ANASTOMOSIS) WITH COLOSTOMY CYSTOURETHROSCOPY, WITH INSERTION OF INDWELLING URETERAL STENT (EG, GIBBONS OR DOUBLE-J TYPE) - Cystourethroscopy - Bilateral ureteral stent placement - Foley catheter placement  Performing Service: Gynecology Oncology Surgeon(s) and Role: Panel 1: * Lafonda Mosses, MD - Primary * Bernadene Bell, MD - Resident - Assisting * Devonne Doughty, MD - Resident - Assisting Panel 2: * Franchot Erichsen, MD - Primary  Drains:  - Left 6Fr open-ended ureteral access catheter (green) -  Right 5Fr open-ended ureteral access catheter (white) - 16Fr foley catheter to drainage  * No implants in log *  Indications: 57 y.o. female with high grade uterine sarcoma. Urology was consulted pre-operatively for placement of bilateral ureteral stents. Risks, benefits, and alternatives of the above procedure were discussed and informed consent was signed.  OperativeFindings:  - Grossly distorted architecture of urinary bladder likely 2/2 pelvic mass with anterolaterally positioned UOs - Successful placement of bilateral open-ended ureteral catheters under direct visualization - Foley catheter placed at case conclusion  Description: The patient was correctly identified in the preop holding area where written informed consent as well potential risk and complication reviewed. She agreed. The patient was brought to the operative suite where a preinduction timeout was performed. Once correct information was verified, general anesthesia was induced. The patient was then gently placed into dorsal lithotomy position with SCDs in place for VTE prophylaxis. They were prepped and draped in the usual sterile fashion and given appropriate preoperative antibiotics. A second timeout was then performed.   We inserted a 17F rigid cystoscope per urethra with copious lubrication and normal saline irrigation running. We performed cystourethroscopy, which revealed the above findings.  We turned our attention to the left ureteral orifice and canulated it with a sensor wire, using assistance of a 6Fr open-ended catheter. The wire was advanced into the renal pelvis without difficulty under visual guidance. We then advanced the stent over our wire into the renal pelvis under direct visualization and feel without complication. The wire was subsequently removed.   We then turned our attention to the right ureteral orifice and canulated it with a sensor wire, using assistance of a 5Fr open-ended catheter. The wire was  advanced into the renal pelvis without difficulty under visual guidance. We then advanced the stent over our wire into the renal pelvis under direct visualization and feel without complication. The wire was subsequently removed.   A 16Fr straight catheter was placed, with return of urine indicating appropriate position within the bladder. The balloon was inflated with 10cc sterile water. The stents were secured to the Foley using 0-silk ties, being careful not to occlude the stents or Foley.   The patient was awoken from general anesthesia having tolerated the procedure well and taken to the PACU for routine post-operative recovery.  Post-Op Plan:  - Foley and stents per primary team    07/17/2021 Surgery   Date of Surgery: 07/17/2021  Pre-op Diagnosis: Uterine spindle cell malignancy  Post-op Diagnosis: Same  Procedure(s): Panel 1 RADICAL ABDOMINAL HYSTER, with bilateral S&O, vagineconty upper, bilateral pelvic lyphadenectomy, bilateral ureterolysis,: 01601 (CPT) Panel 2 CYSTOURETHROSCOPY, WITH INSERTION OF INDWELLING URETERAL STENT (EG, GIBBONS OR DOUBLE-J TYPE): 09323 (CPT) Note: Revisions to procedures should be made in chart - see Procedures activity.  Performing Service: Gynecology Oncology Surgeon(s) and Role: Panel 1: * Lafonda Mosses, MD - Primary * Bernadene Bell, MD - Resident - Assisting * Devonne Doughty, MD -  Resident - Assisting Panel 2: * Franchot Erichsen, MD - Primary  Findings: On bimanual exam, 10cm necrotic mass filling upper vagina, unable to discretely palpate the cervix. On rectovaginal exam, rectal involvement not identified. Intraoperatively, normal upper abdominal survey including normal liver, diaphragm, stomach, omentum and bowel. Small uterus with 10cm mass expanding the cervix. Palpably enlarged bilateral pelvic lymph nodes adherent to the external iliac veins and obturator nerves, removed. No palpable para-aortic lymphadenopathy. No rectal  involvement of uterine mass.   Specimens:  ID Type Source Tests Collected by Time Destination  1 : uterus,cervix,bilateral tubes/ovaries Tissue Uterus SURGICAL PATHOLOGY EXAM Lafonda Mosses, MD 07/17/2021 0932  2 : right pelvic lymph node Tissue Lymph Node SURGICAL PATHOLOGY EXAM Lafonda Mosses, MD 07/17/2021 1125  3 : LEFT PELVIC LN Tissue Lymph Node SURGICAL PATHOLOGY EXAM Lafonda Mosses, MD 07/17/2021 1144    08/06/2021 Initial Diagnosis   Uterine leiomyosarcoma ([place])   08/06/2021 Cancer Staging   Staging form: Corpus Uteri - Leiomyosarcoma and Endometrial Stromal Sarcoma, AJCC 8th Edition - Pathologic stage from 08/06/2021: FIGO Stage IVB (pT3, pN1, cM1) - Signed by [name], MD on 08/11/2021 Stage prefix: Initial diagnosis   08/10/2021 Imaging   CT abdomen and pelvis 1. Interval development of left lobe pulmonary nodules, measuring up to 7 mm and highly for metastatic disease. 2. Interval development of small to upper normal lymph nodes in the pelvis, concerning for metastatic disease. 3. Postoperative seroma left pelvic sidewall. 4. Tiny cluster of tree-in-bud opacity in the peripheral right lower lobe is new and compatible with sequelae of atypical infection.   08/13/2021 Procedure   Procedure: Placement of a right IJ approach single lumen PowerPort.  Tip is positioned at the superior cavoatrial junction and catheter is ready for immediate use.  Complications: No immediate   08/14/2021 Echocardiogram    1. Left ventricular ejection fraction, by estimation, is 60 to 65%. The left ventricle has normal function. The left ventricle has no regional wall motion abnormalities. Left ventricular diastolic parameters were normal. The average left ventricular global longitudinal strain is -17.4 %. The global longitudinal strain is normal.  2. Right ventricular systolic function is normal. The right ventricular size is normal.  3. The mitral valve is normal in structure. No evidence  of mitral valve regurgitation. No evidence of mitral stenosis.  4. The aortic valve is tricuspid. Aortic valve regurgitation is not visualized. No aortic stenosis is present.  5. The inferior vena cava is normal in size with greater than 50% respiratory variability, suggesting right atrial pressure of 3 mmHg.     08/17/2021 Imaging   Multiple new and enlarging pulmonary nodules scattered throughout the lungs bilaterally, highly concerning for progressive metastatic disease to the lungs   08/18/2021 - 11/10/2021 Chemotherapy   Patient is on Treatment Plan : UTERINE LEIOMYOSARCOMA Doxorubicin q21d x 6 Cycles     08/18/2021 - 08/12/2022 Chemotherapy   Patient is on Treatment Plan : UTERINE UNDIFFERENTIATED LEIOMYOSARCOMA Gemcitabine D1,8 + Docetaxel D8 (900/100) q21d     11/09/2021 Imaging   IMPRESSION: 1. Multiple small bilateral pulmonary nodules, some of which are slightly increased in size. Other nodules unchanged. 2. Interval decrease in size of left pelvic sidewall lymph nodes. 3. Unchanged size of perirectal lymph nodes or soft tissue nodules. These however demonstrate new internal hypodensity, suggesting treatment response and internal necrosis. 4. Unchanged left iliac lymph node or peritoneal nodule. 5. Findings are consistent with mixed response to treatment. No evidence of new metastatic  disease in the chest, abdomen, or pelvis. 6. Wall thickening and mucosal hyperenhancement of the bladder, consistent with nonspecific infectious or inflammatory cystitis. Correlate with urinalysis. 7. Status post hysterectomy and oophorectomy. Interval resolution of a previously noted left pelvic hematoma or seroma. 8. Trace, nonspecific free fluid in the low pelvis.   11/30/2021 Echocardiogram    1. Left ventricular ejection fraction, by estimation, is 40 to 45%. Left ventricular ejection fraction by 3D volume is 41 %. The left ventricle has mildly decreased function. The left ventricle has no regional  wall motion abnormalities. Left ventricular  diastolic parameters are consistent with Grade I diastolic dysfunction (impaired relaxation).  2. Right ventricular systolic function is moderately reduced. The right ventricular size is normal.  3. The mitral valve is grossly normal. No evidence of mitral valve regurgitation.  4. The aortic valve is normal in structure. Aortic valve regurgitation is not visualized. No aortic stenosis is present.     12/07/2021 - 05/31/2022 Chemotherapy   Patient is on Treatment Plan : UTERINE UNDIFFERENTIATED / LEIOMYOSARCOMA Gemcitabine D1,8 + Docetaxel D8 (900/100) q21d     02/05/2022 Imaging   Pathologic burst fracture of L3 due to a metastatic lesion, with probable mild degree of right-sided extraosseous tumor extension in the paraspinal soft tissues, and mild involvement of the right pedicle. No epidural/spinal canal involvement.   Multilevel degenerative disc disease without any significant stenosis in the lumbar spine.   05/31/2022 Imaging   1. Signs of pelvic resolution of pelvic sidewall nodal disease/soft tissue near the LEFT vaginal apex. 2. Decreased conspicuity of RIGHT lower lobe pulmonary nodule and stable LEFT apical pulmonary nodule. 3. Unchanged appearance of pathologic fracture at L3. 4. Urinary bladder wall thickening, slightly improved posteriorly, anterior urinary bladder may show some residual diffuse thickening. Continued correlation with signs of cystitis is suggested.     08/24/2022 Imaging   1. Interval growth of a solitary right external iliac nodal metastasis. No additional sites of new or progressive metastatic disease. 2. Small bilateral upper lobe pulmonary nodules are stable to mildly decreased. 3. Chronic incompletely healed pathologic L3 vertebral fracture with underlying lytic metastasis, not appreciably changed. No new focal osseous lesions. 4. New trace dependent bilateral pleural effusions. 5.  Aortic Atherosclerosis  (ICD10-I70.0).     08/27/2022 Procedure   1. Successful L3 Osteocool RFA ablation. 2. Successful L3 kyphoplasty.   10/14/2022 Imaging   Focal soft tissue swelling along the occipital region seen on lateral view may correspond to palpable area of clinical concern. Lytic calvarial lesion underlying this area measures 3.2 cm in craniocaudal dimension, suspicious for osseous metastasis in the setting of known malignancy. Recommend contrast-enhanced MRI or CT for further evaluation.   10/21/2022 Imaging   MRI brain 1. 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 2. 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   10/26/2022 Imaging   1. Multiple new and enlarged bilateral pulmonary nodules, consistent with worsened pulmonary metastatic disease. 2. Interval enlargement of a necrotic appearing right external iliac lymph node, consistent with worsened nodal metastatic disease. 3. Multiple new small peritoneal nodules, consistent with peritoneal metastatic disease. 4. Trace ascites in the low pelvis, presumed malignant. 5. Interval vertebral cement augmentation of a pathologic fracture of L3. 6. Status post hysterectomy.   10/27/2022 Imaging   Bone scan  1. 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  2. No additional foci of abnormal increased uptake suggestive of metastatic disease.   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 Multiple small  bilateral pulmonary nodules, some of which are slightly increased in size. Other nodules unchanged. 2. Interval decrease in size of left pelvic sidewall lymph nodes. 3. Unchanged size of perirectal lymph nodes or soft tissue nodules. These however demonstrate new internal hypodensity, suggesting treatment response and internal necrosis. 4. Unchanged left iliac lymph node or peritoneal nodule. 5. Findings are consistent with mixed response to treatment. No evidence of new metastatic disease in the chest, abdomen, or pelvis. 6. Wall thickening and mucosal hyperenhancement of the bladder, consistent with nonspecific infectious or inflammatory cystitis. Correlate with urinalysis. 7. Status post hysterectomy and oophorectomy. Interval resolution of a previously noted left pelvic hematoma or seroma. 8. Trace, nonspecific free fluid in the low pelvis.   12/07/2021 - 05/31/2022 Chemotherapy   Patient is on Treatment Plan : UTERINE UNDIFFERENTIATED / LEIOMYOSARCOMA Gemcitabine D1,8 + Docetaxel D8 (900/100) q21d       PHYSICAL EXAMINATION: ECOG PERFORMANCE STATUS: 1 - Symptomatic but completely ambulatory  Vitals:   10/28/22 1357  BP: (!) 149/77  Pulse: 89  Resp: 18  Temp: 98.3 F (36.8 C)  SpO2: 100%   Filed Weights   10/28/22 1357  Weight: 130 lb 9.6 oz (59.2 kg)    GENERAL:alert, no distress and comfortable NEURO: alert & oriented x 3 with fluent speech, no focal motor/sensory deficits  LABORATORY DATA:  I have reviewed the data as listed    Component Value Date/Time   NA 141 10/28/2022 1337   K 4.0 10/28/2022 1337   CL 106 10/28/2022 1337   CO2 29 10/28/2022 1337   GLUCOSE 88 10/28/2022 1337   BUN 14 10/28/2022 1337   CREATININE 0.77 10/28/2022 1337   CALCIUM 9.1 10/28/2022 1337   PROT 5.8 (L) 10/28/2022 1337   ALBUMIN 3.6 10/28/2022 1337   AST 14 (L) 10/28/2022 1337   ALT 13 10/28/2022 1337   ALKPHOS 64 10/28/2022 1337   BILITOT 0.3 10/28/2022 1337   GFRNONAA >60  10/28/2022 1337    No results found for: "SPEP", "UPEP"  Lab Results  Component Value Date   WBC 3.6 (L) 10/28/2022   NEUTROABS 2.5 10/28/2022   HGB 12.6 10/28/2022   HCT 37.2 10/28/2022   MCV 91.0 10/28/2022   PLT 202 10/28/2022      Chemistry      Component Value Date/Time   NA 141 10/28/2022 1337   K 4.0 10/28/2022 1337   CL 106 10/28/2022 1337   CO2 29 10/28/2022 1337   BUN 14 10/28/2022 1337   CREATININE 0.77 10/28/2022 1337      Component Value Date/Time   CALCIUM 9.1 10/28/2022 1337   ALKPHOS 64 10/28/2022 1337   AST 14 (L) 10/28/2022 1337   ALT 13 10/28/2022 1337   BILITOT 0.3 10/28/2022 1337       RADIOGRAPHIC STUDIES: I have reviewed multiple imaging studies with the patient I have personally reviewed the radiological images as listed and agreed with the findings in the report. NM Bone Scan Whole  Body  Result Date: 10/28/2022 CLINICAL DATA:  History of cervical cancer, leiomyosarcoma EXAM: NUCLEAR MEDICINE WHOLE BODY BONE SCAN TECHNIQUE: Whole body anterior and posterior images were obtained approximately 3 hours after intravenous injection of radiopharmaceutical. RADIOPHARMACEUTICALS:  20.3 mCi Technetium-68mMDP IV COMPARISON:  Brain MRI 10/20/2022 and CT chest abdomen pelvis 10/22/2022. 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 This no additional foci of abnormal increased radiotracer uptake identified. Physiologic tracer activity noted within the kidneys an urinary bladder. IMPRESSION: 1. 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 2. No additional foci of abnormal increased uptake suggestive of metastatic disease. Electronically Signed   By: TKerby MoorsM.D.   On: 10/28/2022 08:16   CT CHEST ABDOMEN PELVIS W CONTRAST  Result Date:  10/23/2022 CLINICAL DATA:  Cervical cancer, leiomyosarcoma, status post chemotherapy, assess treatment response * Tracking Code: BO * EXAM: CT CHEST, ABDOMEN, AND PELVIS WITH CONTRAST TECHNIQUE: Multidetector CT imaging of the chest, abdomen and pelvis was performed following the standard protocol during bolus administration of intravenous contrast. RADIATION DOSE REDUCTION: This exam was performed according to the departmental dose-optimization program which includes automated exposure control, adjustment of the mA and/or kV according to patient size and/or use of iterative reconstruction technique. CONTRAST:  1060mOMNIPAQUE IOHEXOL 300 MG/ML  SOLN COMPARISON:  08/20/2022 FINDINGS: CT CHEST FINDINGS Cardiovascular: Right chest port catheter. Normal heart size. No pericardial effusion. Mediastinum/Nodes: No enlarged mediastinal, hilar, or axillary lymph nodes. Thyroid gland, trachea, and esophagus demonstrate no significant findings. Lungs/Pleura: Interval enlargement of a nodule of the anterior right upper lobe measuring 1.0 x 0.8 cm, previously no greater than 0.3 cm (series 7, image 46). Interval enlargement of a nodule of the medial right upper lobe measuring 1.5 x 1.0 cm, previously no greater than 0.5 cm (series 7, image 41). Multiple new nodules, for example a 0.8 x 0.6 cm nodule of the medial left lower lobe (series 7, image 111). No pleural effusion or pneumothorax. Musculoskeletal: No chest wall abnormality. No acute osseous findings. CT ABDOMEN PELVIS FINDINGS Hepatobiliary: No solid liver abnormality is seen. No gallstones, gallbladder wall thickening, or biliary dilatation. Pancreas: Unremarkable. No pancreatic ductal dilatation or surrounding inflammatory changes. Spleen: Normal in size without significant abnormality. Adrenals/Urinary Tract: Adrenal glands are unremarkable. Kidneys are normal, without renal calculi, solid lesion, or hydronephrosis. Bladder is unremarkable. Stomach/Bowel: Stomach is  within normal limits. Appendix appears normal. No evidence of bowel wall thickening, distention, or inflammatory changes. Vascular/Lymphatic: No significant vascular findings are present. Interval enlargement of a necrotic appearing right external iliac lymph node measuring 2.0 x 1.6 cm, previously 1.5 x 1.4 cm (series 2, image 99). Reproductive: Status post hysterectomy. Other: No abdominal wall hernia or abnormality. Trace ascites in the low pelvis (series 2, image 109). Probable prior omentectomy. Multiple new small peritoneal nodules, for example in the midline ventral abdomen measuring 1.7 x 1.2 cm (series 2, image 85) and in the ventral right lower quadrant measuring 1.0 x 0.9 cm (series 2, image 89). Musculoskeletal: No acute osseous findings. Interval vertebral cement augmentation of a pathologic fracture of L3 (series 6, image 104). IMPRESSION: 1. Multiple new and enlarged bilateral pulmonary nodules, consistent with worsened pulmonary metastatic disease. 2. Interval enlargement of a necrotic appearing right external iliac lymph node, consistent with worsened nodal metastatic disease. 3. Multiple new small peritoneal  nodules, consistent with peritoneal metastatic disease. 4. Trace ascites in the low pelvis, presumed malignant. 5. Interval vertebral cement augmentation of a pathologic fracture of L3. 6. Status post hysterectomy. Electronically Signed   By: Delanna Ahmadi M.D.   On: 10/23/2022 15:55   MR Brain W Wo Contrast  Result Date: 10/21/2022 CLINICAL DATA:  Metastatic disease evaluation. History of leiomyosarcoma. Painful posterior skull lump. EXAM: MRI HEAD WITHOUT AND WITH CONTRAST TECHNIQUE: Multiplanar, multiecho pulse sequences of the brain and surrounding structures were obtained without and with intravenous contrast. CONTRAST:  5.45m GADAVIST GADOBUTROL 1 MMOL/ML IV SOLN COMPARISON:  Skull radiographs 10/13/2022.  Head CT 12/24/2021. FINDINGS: Brain: There is no evidence of an acute infarct,  midline shift, or extra-axial fluid collection. The ventricles and sulci are normal. Scattered small T2 hyperintensities in the cerebral white matter bilaterally are nonspecific but compatible with minimal chronic small vessel ischemic disease. 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 An enhancing lesion in the left frontoparietal operculum measures 9 mm and demonstrates a small amount of susceptibility centrally which may reflect blood products or mineralization (series 16, image 91). There is mild surrounding edema without mass effect. Vascular: Major intracranial vascular flow voids are preserved. Skull and upper cervical spine: 3 cm destructive midline occipital skull lesion with associated mild dural thickening over both occipital poles and with tumor extension into the overlying scalp soft tissues. No involvement of the adjacent superior sagittal sinus. Sinuses/Orbits: Unremarkable orbits. Mild mucosal thickening in the paranasal sinuses. Clear mastoid air cells. Other: None. IMPRESSION: 1. 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 2. 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 Electronically Signed   By: ALogan BoresM.D.   On: 10/21/2022 09:50   DG Skull 1-3 Views  Result Date: 10/13/2022 CLINICAL DATA:  History of leiomyosarcoma with new painful palpable area in the right posterior skull. No known injury. EXAM: SKULL - 1-3 VIEW COMPARISON:  CT head dated 12/24/2021 FINDINGS: Focal soft tissue swelling along the occipital seen on lateral view, not well assessed on the frontal view, overlying an area of calvarial lytic lesion with permeative appearance measuring up to 3.2 cm in craniocaudal dimension. IMPRESSION: Focal soft tissue swelling along the occipital region seen on lateral view may correspond to palpable area of clinical concern. Lytic calvarial lesion  underlying this area measures 3.2 cm in craniocaudal dimension, suspicious for osseous metastasis in the setting of known malignancy. Recommend contrast-enhanced MRI or CT for further evaluation. These results will be called to the ordering clinician or representative by the Radiologist Assistant, and communication documented in the PACS or [employer] Electronically Signed   By: LDarrin NipperM.D.   On: 10/13/2022 16:42

## 2022-10-29 ENCOUNTER — Telehealth: Payer: Self-pay | Admitting: Oncology

## 2022-10-29 ENCOUNTER — Encounter: Payer: Self-pay | Admitting: Hematology and Oncology

## 2022-10-29 NOTE — Telephone Encounter (Signed)
Called Tammie Gilmore back and dvised her of Dr. Calton Dach message below.  She said she is leaning towards not taking the trabectedin but wants to discuss all the options with Dr. Berline Lopes on Monday before making a final decision.  Also, advised her that radiation does not have any earlier appointments available now but they would like her to call them Monday morning to check.  Gave her the number for radiation.

## 2022-10-29 NOTE — Telephone Encounter (Signed)
I explained to her and husband her most important decision is to decide if she wants to take trabectedin or not Or she wait until after she sees Dr. Berline Lopes to decide

## 2022-10-29 NOTE — Telephone Encounter (Signed)
Tammie Gilmore and scheduled an appointment with Dr. Berline Lopes on Monday, 11/01/22 at 3:00.  Also reviewed her echo appointment and advised that we will try to move her radiation appointments closer to both appointments and let her know.  She has also been thinking about her chemo choices.  She wants to know the best one to pick and was wondering if she can get more information to help narrow down the last 2 choices (she doesn't remember the names).

## 2022-11-01 ENCOUNTER — Ambulatory Visit
Admission: RE | Admit: 2022-11-01 | Discharge: 2022-11-01 | Disposition: A | Discharge status: Home / Self Care | Payer: BC Managed Care – PPO | Source: Ambulatory Visit | Attending: Radiation Oncology | Admitting: Radiation Oncology

## 2022-11-01 ENCOUNTER — Other Ambulatory Visit: Payer: Self-pay

## 2022-11-01 ENCOUNTER — Inpatient Hospital Stay (HOSPITAL_BASED_OUTPATIENT_CLINIC_OR_DEPARTMENT_OTHER): Payer: BC Managed Care – PPO | Admitting: Gynecologic Oncology

## 2022-11-01 VITALS — BP 160/80 | HR 94 | Temp 98.5°F | Resp 14 | Wt 129.4 lb

## 2022-11-01 DIAGNOSIS — C7931 Secondary malignant neoplasm of brain: Secondary | ICD-10-CM | POA: Insufficient documentation

## 2022-11-01 DIAGNOSIS — C7951 Secondary malignant neoplasm of bone: Secondary | ICD-10-CM | POA: Insufficient documentation

## 2022-11-01 DIAGNOSIS — C7800 Secondary malignant neoplasm of unspecified lung: Secondary | ICD-10-CM | POA: Insufficient documentation

## 2022-11-01 DIAGNOSIS — R609 Edema, unspecified: Secondary | ICD-10-CM | POA: Insufficient documentation

## 2022-11-01 DIAGNOSIS — Z7952 Long term (current) use of systemic steroids: Secondary | ICD-10-CM | POA: Insufficient documentation

## 2022-11-01 DIAGNOSIS — M899 Disorder of bone, unspecified: Secondary | ICD-10-CM

## 2022-11-01 DIAGNOSIS — C541 Malignant neoplasm of endometrium: Secondary | ICD-10-CM | POA: Insufficient documentation

## 2022-11-01 DIAGNOSIS — C55 Malignant neoplasm of uterus, part unspecified: Secondary | ICD-10-CM | POA: Diagnosis not present

## 2022-11-01 DIAGNOSIS — C778 Secondary and unspecified malignant neoplasm of lymph nodes of multiple regions: Secondary | ICD-10-CM | POA: Insufficient documentation

## 2022-11-01 DIAGNOSIS — Z923 Personal history of irradiation: Secondary | ICD-10-CM | POA: Insufficient documentation

## 2022-11-01 LAB — RAD ONC ARIA SESSION SUMMARY
Course Elapsed Days: 0
Plan Fractions Treated to Date: 1
Plan Prescribed Dose Per Fraction: 3 Gy
Plan Total Fractions Prescribed: 10
Plan Total Prescribed Dose: 30 Gy
Reference Point Dosage Given to Date: 3 Gy
Reference Point Session Dosage Given: 3 Gy
Session Number: 1

## 2022-11-01 NOTE — Progress Notes (Signed)
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 9.5 x 7.6 x 9.0 cm complex, partially necrotic, mass involving the lower uterine segment/ cervix. No obvious direct extension into the parametrium.  2. 9 mm left pelvic sidewall lymph node is partially necrotic and worrisome for metastatic adenopathy.  3. No findings for abdominal omental or peritoneal surface disease or adenopathy.  4. Tiny low-attenuation lesion in the pancreatic head, likely benign cyst but attention on follow-up scans is suggested.  5. 2.9 cm fundal fibroid.    06/19/2021 Pathology Results   FINAL MICROSCOPIC DIAGNOSIS:   A. UTERINE, CERVICAL MASS, BIOPSY:  - Spindle cell malignancy.  - See comment.   COMMENT:  The biopsies consist of endocervical mucosa with stromal edema and one biopsy fragment has a microscopic focus with atypical spindle cells consistent with poorly differentiated malignancy.  The differential  includes a spindle cell malignancy such as sarcomatoid carcinoma and leiomyosarcoma.  Mullerian adenosarcoma is also a consideration but considered less likely   06/23/2021 Imaging   MR pelvis  10 cm uterine mass with central necrosis, which is centered in the cervix and lower uterine segment. Right parametrial involvement is seen as well as suspected invasion of the distal rectum. Differential diagnosis includes cervical carcinoma and uterine leiomyosarcoma.   Mild bilateral iliac lymphadenopathy, highly suspicious for metastatic disease.   2.9 cm subserosal fibroid in the posterior fundus.   Normal appearance of both ovaries.     06/29/2021 PET scan   1. Hypermetabolic necrotic cervical/uterine mass with bilateral external iliac hypermetabolic lymph nodes. No evidence of distant metastatic disease. 2. 1.5 cm low-attenuation left thyroid nodule. Recommend thyroid  ultrasound. (Ref: J Am Coll Radiol. 2015 Feb;12(2): 143-50).   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, pleomorphic, and myxoid areas and associated necrosis (~20%) - Tumor based in cervix and also involves lower uterine segment - Cervicovaginal margin involved by focal invasive leiomyosarcoma (3:00-5:00, A10) as well as tumor in lymphovascular spaces - Leiomyosarcoma involves right and left parametrial tissue and extends to parametrial margins - Extensive lymphovascular space invasion present, including in uterus and parametria - See synoptic report and comment   Other findings: - Leiomyomata with hyalinization, size up to 3.0 cm - Ovaries and fallopian tubes with no parenchymal involvement by leiomyosarcoma identified, although adnexal lymphovascular space invasion is present   B: Lymph nodes, right pelvic, lymphadenectomy - One of four lymph nodes positive for metastatic leiomyosarcoma (1/4), with extracapsular extension present   C: Lymph nodes, left pelvic, lymphadenectomy - One of four lymph nodes positive for metastatic leiomyosarcoma (1/4), with extracapsular extension present  Immunohistochemical stains are performed on block A11, and demonstrate that the tumor is positive for desmin and CD10, with SMA staining the majority of the spindle cell component but largely negative in the epithelioid / pleomorphic component. OSCAR, pancytokeratin AE1/AE3, HMB45, and PR appear negative in the tumor. ER shows patchy weak staining and myogenin stains rare cells. Block A23 also shows positive desmin and negative OSCAR pancytokeratin. Overall, the findings are most consistent with leiomyosarcoma, with extensive areas that are myxoid, epithelioid, and pleomorphic as well as more typical spindle cell areas within the  overall high grade tumor (grade 3 / 3). The tumor is staged as pT2b (involves  other pelvic tissues) given the parametrial involvement and pN1 for FIGO stage IIIC.    07/17/2021 Surgery   Date of Surgery: 07/17/21  Preoperative Diagnosis: High Grade Uterine Sarcoma  Postoperative Diagnosis: Same  Procedure(s): Bilateral - RADICAL ABDOMINAL HYSTER, W/BIL TOTAL PELVIC LYMPHADENECTOMY & PARA-AORTIC LYMPH NODE BX W/WO REM TUBE/OVAR VAGINAL HYSTERECTOMY, FOR UTERUS 250 G OR LESS; WITH REPAIR OF ENTEROCELE COLECTOMY, PARTIAL; WITH COLOPROCTOSTOMY (LOW PELVIC ANASTOMOSIS) WITH COLOSTOMY CYSTOURETHROSCOPY, WITH INSERTION OF INDWELLING URETERAL STENT (EG, GIBBONS OR DOUBLE-J TYPE) - Cystourethroscopy - Bilateral ureteral stent placement - Foley catheter placement  Performing Service: Gynecology Oncology Surgeon(s) and Role: Panel 1: * Lafonda Mosses, MD - Primary * Bernadene Bell, MD - Resident - Assisting * Devonne Doughty, MD - Resident - Assisting Panel 2: * Franchot Erichsen, MD - Primary  Drains:  - Left 6Fr open-ended ureteral access catheter (green) - Right 5Fr open-ended ureteral access catheter (white) - 16Fr foley catheter to drainage  * No implants in log *  Indications: 57 y.o. female with high grade uterine sarcoma. Urology was consulted pre-operatively for placement of bilateral ureteral stents. Risks, benefits, and alternatives of the above procedure were discussed and informed consent was signed.  OperativeFindings:  - Grossly distorted architecture of urinary bladder likely 2/2 pelvic mass with anterolaterally positioned UOs - Successful placement of bilateral open-ended ureteral catheters under direct visualization - Foley catheter placed at case conclusion  Description: The patient was correctly identified in the preop holding area where written informed consent as well potential risk and complication reviewed. She agreed. The patient was brought to the  operative suite where a preinduction timeout was performed. Once correct information was verified, general anesthesia was induced. The patient was then gently placed into dorsal lithotomy position with SCDs in place for VTE prophylaxis. They were prepped and draped in the usual sterile fashion and given appropriate preoperative antibiotics. A second timeout was then performed.   We inserted a 53F rigid cystoscope per urethra with copious lubrication and normal saline irrigation running. We performed cystourethroscopy, which revealed the above findings.  We turned our attention to the left ureteral orifice and canulated it with a sensor wire, using assistance of a 6Fr open-ended catheter. The wire was advanced into the renal pelvis without difficulty under visual guidance. We then advanced the stent over our wire into the renal pelvis under direct visualization and feel without complication. The wire was subsequently removed.   We then turned our attention to the right ureteral orifice and canulated it with a sensor wire, using assistance of a 5Fr open-ended catheter. The wire was advanced into the renal pelvis without difficulty under visual guidance. We then advanced the stent over our wire into the renal pelvis under direct visualization and feel without complication. The wire was subsequently removed.   A 16Fr straight catheter was placed, with return of urine indicating appropriate position within the bladder. The balloon was inflated with 10cc sterile water. The stents were secured to the Foley using 0-silk ties, being careful not to occlude the stents or Foley.   The patient was awoken from general anesthesia having tolerated the procedure well and taken to the PACU for routine post-operative recovery.  Post-Op Plan:  - Foley and stents per primary team    07/17/2021 Surgery   Date of Surgery: 07/17/2021  Pre-op Diagnosis: Uterine spindle cell malignancy  Post-op Diagnosis:  Same  Procedure(s): Panel 1 RADICAL ABDOMINAL HYSTER, with bilateral S&O, vagineconty upper, bilateral pelvic lyphadenectomy, bilateral  ureterolysis,: [id number] (CPT) Panel 2 CYSTOURETHROSCOPY, WITH INSERTION OF INDWELLING URETERAL STENT (EG, GIBBONS OR DOUBLE-J TYPE): 09381 (CPT) Note: Revisions to procedures should be made in chart - see Procedures activity.  Performing Service: Gynecology Oncology Surgeon(s) and Role: Panel 1: * Lafonda Mosses, MD - Primary * Bernadene Bell, MD - Resident - Assisting * Devonne Doughty, MD - Resident - Assisting Panel 2: * Franchot Erichsen, MD - Primary  Findings: On bimanual exam, 10cm necrotic mass filling upper vagina, unable to discretely palpate the cervix. On rectovaginal exam, rectal involvement not identified. Intraoperatively, normal upper abdominal survey including normal liver, diaphragm, stomach, omentum and bowel. Small uterus with 10cm mass expanding the cervix. Palpably enlarged bilateral pelvic lymph nodes adherent to the external iliac veins and obturator nerves, removed. No palpable para-aortic lymphadenopathy. No rectal involvement of uterine mass.   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 Interval  development of left lobe pulmonary nodules, measuring up to 7 mm and highly for metastatic disease. 2. Interval development of small to upper normal lymph nodes in the pelvis, concerning for metastatic disease. 3. Postoperative seroma left pelvic sidewall. 4. Tiny cluster of tree-in-bud opacity in the peripheral right lower lobe is new and compatible with sequelae of atypical infection.   08/13/2021 Procedure   Procedure: Placement of a right IJ approach single lumen PowerPort.  Tip is positioned at the superior cavoatrial junction and catheter is ready for immediate use.  Complications: No immediate   08/14/2021 Echocardiogram    1. Left ventricular ejection fraction, by estimation, is 60 to 65%. The left ventricle has normal function. The left ventricle has no regional wall motion abnormalities. Left ventricular diastolic parameters were normal. The average left ventricular global longitudinal strain is -17.4 %. The global longitudinal strain is normal.  2. Right ventricular systolic function is normal. The right ventricular size is normal.  3. The mitral valve is normal in structure. No evidence of mitral valve regurgitation. No evidence of mitral stenosis.  4. The aortic valve is tricuspid. Aortic valve regurgitation is not visualized. No aortic stenosis is present.  5. The inferior vena cava is normal in size with greater than 50% respiratory variability, suggesting right atrial pressure of 3 mmHg.     08/17/2021 Imaging   Multiple new and enlarging pulmonary nodules scattered throughout the lungs bilaterally, highly concerning for progressive metastatic disease to the lungs   08/18/2021 - 11/10/2021 Chemotherapy   Patient is on Treatment Plan : UTERINE LEIOMYOSARCOMA Doxorubicin q21d x 6 Cycles     08/18/2021 - 08/12/2022 Chemotherapy   Patient is on Treatment Plan : UTERINE UNDIFFERENTIATED LEIOMYOSARCOMA Gemcitabine D1,8 + Docetaxel D8 (900/100) q21d     11/09/2021 Imaging    IMPRESSION: 1. Multiple small bilateral pulmonary nodules, some of which are  slightly increased in size. Other nodules unchanged. 2. Interval decrease in size of left pelvic sidewall lymph nodes. 3. Unchanged size of perirectal lymph nodes or soft tissue nodules. These however demonstrate new internal hypodensity, suggesting treatment response and internal necrosis. 4. Unchanged left iliac lymph node or peritoneal nodule. 5. Findings are consistent with mixed response to treatment. No evidence of new metastatic disease in the chest, abdomen, or pelvis. 6. Wall thickening and mucosal hyperenhancement of the bladder, consistent with nonspecific infectious or inflammatory cystitis. Correlate with urinalysis. 7. Status post hysterectomy and oophorectomy. Interval resolution of a previously noted left pelvic hematoma or seroma. 8. Trace, nonspecific free fluid in the low pelvis.   11/30/2021 Echocardiogram    1. Left ventricular ejection fraction, by estimation, is 40 to 45%. Left ventricular ejection fraction by 3D volume is 41 %. The left ventricle has mildly decreased function. The left ventricle has no regional wall motion abnormalities. Left ventricular  diastolic parameters are consistent with Grade I diastolic dysfunction (impaired relaxation).  2. Right ventricular systolic function is moderately reduced. The right ventricular size is normal.  3. The mitral valve is grossly normal. No evidence of mitral valve regurgitation.  4. The aortic valve is normal in structure. Aortic valve regurgitation is not visualized. No aortic stenosis is present.     12/07/2021 - 05/31/2022 Chemotherapy   Patient is on Treatment Plan : UTERINE UNDIFFERENTIATED / LEIOMYOSARCOMA Gemcitabine D1,8 + Docetaxel D8 (900/100) q21d     02/05/2022 Imaging   Pathologic burst fracture of L3 due to a metastatic lesion, with probable mild degree of right-sided extraosseous tumor extension in the paraspinal soft tissues, and mild  involvement of the right pedicle. No epidural/spinal canal involvement.   Multilevel degenerative disc disease without any significant stenosis in the lumbar spine.   05/31/2022 Imaging   1. Signs of pelvic resolution of pelvic sidewall nodal disease/soft tissue near the LEFT vaginal apex. 2. Decreased conspicuity of RIGHT lower lobe pulmonary nodule and stable LEFT apical pulmonary nodule. 3. Unchanged appearance of pathologic fracture at L3. 4. Urinary bladder wall thickening, slightly improved posteriorly, anterior urinary bladder may show some residual diffuse thickening. Continued correlation with signs of cystitis is suggested.     08/24/2022 Imaging   1. Interval growth of a solitary right external iliac nodal metastasis. No additional sites of new or progressive metastatic disease. 2. Small bilateral upper lobe pulmonary nodules are stable to mildly decreased. 3. Chronic incompletely healed pathologic L3 vertebral fracture with underlying lytic metastasis, not appreciably changed. No new focal osseous lesions. 4. New trace dependent bilateral pleural effusions. 5.  Aortic Atherosclerosis (ICD10-I70.0).     08/27/2022 Procedure   1. Successful L3 Osteocool RFA ablation. 2. Successful L3 kyphoplasty.   10/14/2022 Imaging   Focal soft tissue swelling along the occipital region seen on lateral view may correspond to palpable area of clinical concern. Lytic calvarial lesion underlying this area measures 3.2 cm in craniocaudal dimension, suspicious for osseous metastasis in the setting of known malignancy. Recommend contrast-enhanced MRI or CT for further evaluation.   10/21/2022 Imaging   MRI brain 1. 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 2. 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   10/26/2022 Imaging   1. Multiple new and enlarged bilateral pulmonary nodules, consistent with  worsened pulmonary metastatic disease. 2. Interval enlargement of a necrotic appearing right external iliac lymph node, consistent with worsened nodal metastatic  disease. 3. Multiple new small peritoneal nodules, consistent with peritoneal metastatic disease. 4. Trace ascites in the low pelvis, presumed malignant. 5. Interval vertebral cement augmentation of a pathologic fracture of L3. 6. Status post hysterectomy.   10/27/2022 Imaging   Bone scan  1. 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 2. No additional foci of abnormal increased uptake suggestive of metastatic disease.   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 Multiple small bilateral pulmonary nodules, some of which are slightly increased in size. Other nodules unchanged. 2. Interval decrease in size of left pelvic sidewall lymph nodes. 3. Unchanged size of perirectal lymph nodes or soft tissue nodules. These however demonstrate new internal hypodensity, suggesting treatment response and internal necrosis. 4. Unchanged left iliac lymph node or peritoneal nodule. 5. Findings are consistent with mixed response to treatment. No evidence of new metastatic disease in the chest, abdomen, or pelvis. 6. Wall thickening and mucosal hyperenhancement of the bladder, consistent with nonspecific infectious or inflammatory cystitis. Correlate with urinalysis. 7. Status post hysterectomy and oophorectomy. Interval resolution of a previously noted left pelvic hematoma or seroma. 8. Trace,  nonspecific free fluid in the low pelvis.   12/07/2021 - 05/31/2022 Chemotherapy   Patient is on Treatment Plan : UTERINE UNDIFFERENTIATED / LEIOMYOSARCOMA Gemcitabine D1,8 + Docetaxel D8 (900/100) q21d       Interval History: Patient reports overall doing well.  Has some ringing in her ears and the mild headache.  Overall, has been feeling relatively asymptomatic.  Has occasional nausea and some anxiety.  Area on her skull is uncomfortable if she lays on something hard or there is pressure placed.  She uses Tylenol once or twice a day with some relief.  Denies any shortness of breath or chest pain.  Denies any abdominal or pelvic pain.  She denies any vaginal bleeding or discharge.  Continues to be somewhat constipated, at baseline.  She denies any urinary symptoms.  Past Medical/Surgical History: Past Medical History:  Diagnosis Date   History of radiation therapy    Lumbar Spine- 03/10/22-03/23/22- Dr. Gery Pray   History of radiation therapy    SBRT to iliac nodes; 09/14/22-09/24/22 Dr. Gery Pray   Hypoglycemia    occasional episodes of hypoglycemia   IBS (irritable bowel syndrome)     Past Surgical History:  Procedure Laterality Date   HERNIA REPAIR  1994   left inguinal, with mesh   IR BONE TUMOR(S)RF ABLATION  08/26/2022   IR IMAGING GUIDED PORT INSERTION  08/13/2021   IR KYPHO LUMBAR INC FX REDUCE BONE BX UNI/BIL CANNULATION INC/IMAGING  08/26/2022   IR RADIOLOGIST EVAL & MGMT  08/03/2022   IR RADIOLOGIST EVAL & MGMT  09/09/2022    Family History  Problem Relation Age of Onset   Heart attack Mother    Heart disease Mother    Endometriosis Mother    Heart disease Father    Heart attack Father    Cancer - Colon Neg Hx    Breast cancer Neg Hx    Cancer Neg Hx  Ovarian cancer Neg Hx    Uterine cancer Neg Hx    Pancreatic cancer Neg Hx    Pancreatic disease Neg Hx    Prostate cancer Neg Hx     Social History   Socioeconomic History   Marital status:  Married    Spouse name: Not on file   Number of children: 3   Years of education: Not on file   Highest education level: Not on file  Occupational History   Occupation: accountant  Tobacco Use   Smoking status: Never   Smokeless tobacco: Never  Vaping Use   Vaping Use: Never used  Substance and Sexual Activity   Alcohol use: Never   Drug use: Never   Sexual activity: Not Currently  Other Topics Concern   Not on file  Social History Narrative   Not on file   Social Determinants of Health   Financial Resource Strain: Not on file  Food Insecurity: Not on file  Transportation Needs: Not on file  Physical Activity: Not on file  Stress: Not on file  Social Connections: Not on file    Current Medications:  Current Outpatient Medications:    acetaminophen (TYLENOL) 500 MG tablet, Take 1,000 mg by mouth every 6 (six) hours as needed for moderate pain or headache., Disp: , Rfl:    calcium carbonate (TUMS - DOSED IN MG ELEMENTAL CALCIUM) 500 MG chewable tablet, Chew 1 tablet by mouth 2 (two) times daily., Disp: , Rfl:    cholecalciferol (VITAMIN D3) 25 MCG (1000 UNIT) tablet, Take 2,000 Units by mouth daily., Disp: , Rfl:    dexamethasone (DECADRON) 4 MG tablet, Take 0.5 tablets (2 mg total) by mouth daily., Disp: , Rfl:    estradiol (ESTRACE) 0.1 MG/GM vaginal cream, PLACE FINGER TIP SIZE AMOUNT OF CREAM AND INSERT SLIGHTLY PAST THE VAGINAL ENTRANCE 3 TIMES DAILY (Patient not taking: Reported on 08/03/2022), Disp: 126 g, Rfl: 4   lidocaine (XYLOCAINE) 2 % solution, SMARTSIG:By Mouth, Disp: , Rfl:    lidocaine-prilocaine (EMLA) cream, Apply to affected area once, Disp: 30 g, Rfl: 3   loratadine (CLARITIN) 10 MG tablet, Take 10 mg by mouth daily as needed (for bone aches)., Disp: , Rfl:    LORazepam (ATIVAN) 0.5 MG tablet, Take 1 tablet (0.5 mg total) by mouth 2 (two) times daily as needed for anxiety. (Patient not taking: Reported on 04/26/2022), Disp: 30 tablet, Rfl: 0   LORazepam  (ATIVAN) 1 MG tablet, Take 1 tablet (1 mg total) by mouth as needed for anxiety. 1 hour prior to MRI, radiation planning and treatment, Disp: 15 tablet, Rfl: 0   magic mouthwash (nystatin, diphenhydrAMINE, alum & mag hydroxide) suspension mixture, Swish and spit 5 mLs 4 (four) times daily as needed for mouth pain., Disp: 240 mL, Rfl: 0   metoprolol succinate (TOPROL XL) 25 MG 24 hr tablet, Take 1 tablet (25 mg total) by mouth at bedtime., Disp: 30 tablet, Rfl: 6   ondansetron (ZOFRAN) 8 MG tablet, Take 1 tablet (8 mg total) by mouth every 8 (eight) hours as needed. (Patient not taking: Reported on 08/03/2022), Disp: 30 tablet, Rfl: 1   prochlorperazine (COMPAZINE) 10 MG tablet, Take 1 tablet (10 mg total) by mouth every 6 (six) hours as needed (Nausea or vomiting)., Disp: 90 tablet, Rfl: 1   senna (SENOKOT) 8.6 MG TABS tablet, Take 2 tablets by mouth at bedtime., Disp: , Rfl:   Review of Systems: Denies appetite changes, fevers, chills, fatigue, unexplained weight changes. Denies hearing  loss, neck lumps or masses, mouth sores, ringing in ears or voice changes. Denies cough or wheezing.  Denies shortness of breath. Denies chest pain or palpitations. Denies leg swelling. Denies abdominal distention, pain, blood in stools, constipation, diarrhea, nausea, vomiting, or early satiety. Denies pain with intercourse, dysuria, frequency, hematuria or incontinence. Denies hot flashes, pelvic pain, vaginal bleeding or vaginal discharge.   Denies joint pain, back pain or muscle pain/cramps. Denies itching, rash, or wounds. Denies dizziness, headaches, numbness or seizures. Denies swollen lymph nodes or glands, denies easy bruising or bleeding. Denies anxiety, depression, confusion, or decreased concentration.  Physical Exam: BP (!) 160/80 (BP Location: Left Arm, Patient Position: Sitting)   Pulse 94   Temp 98.5 F (36.9 C) (Oral)   Resp 14   Wt 129 lb 6.4 oz (58.7 kg)   SpO2 100%   BMI 19.68 kg/m   General: Alert, oriented, no acute distress. HEENT: Atraumatic, normocephalic, sclera anicteric. Chest: Labored breathing on room air.  Laboratory & Radiologic Studies: None new  Assessment & Plan: Tammie Gilmore is a 57 y.o. woman with recurrent LMS.  Overall, the patient is feeling quite well.  Has some symptoms related to her occipital skull met, otherwise is asymptomatic.  We discussed recent imaging findings showing bony met (confirmed on bone scan), solitary brain met, pulmonary metastatic disease, enlarging retroperitoneal disease as well as findings consistent with carcinomatosis.  Patient was started on low-dose steroids and began radiation to her skull metastasis yesterday.  Plan is also for consideration of SRS for solitary brain met.  The patient met with Dr. Alvy Bimler last week regarding systemic therapy.  Given the patient's excellent performance status and overall health, I think she is a good candidate to pursue additional lines of therapy.  She is very understanding of the treatment goals.  We discussed again today that this is not great of treatment but rather to help slow progression of disease while maintaining some quality of life.  Treatment regimens were previously reviewed with Dr. Alvy Bimler.  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  Repeat echo approximately 2 months after her initial echo showing cardiomyopathy (repeat echo was in April 2023) showed left ventricle with low normal function (LVEF 50-55%).  The patient is scheduled for repeat echo later this week.  She has previously been counseled on the risk of cardiomyopathy with trabectedin.  I am not aware of data regarding the risk of cardiomyopathy in the setting of prior cardiomyopathy from doxorubicin.  I think it would be reasonable to pursue treatment with trabectedin with very close cardiac  follow-up.  We also discussed the possibility of restarting gemcitabine/docetaxel, which the patient tolerated well.  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  I agree with genomic testing.  There are other targeted agents that the patient may be a candidate for depending on this testing.  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  We also discussed the possibility of participation in a clinical trial.  I am unsure whether [facility] currently has a sarcoma trial open that the patient would be a candidate for.  Given her multiple prior lines of treatment and prior development of cardiomyopathy on doxorubicin, I suggested and offered  referral to the bone and soft tissue oncology group at [facility].  This would allow the patient to be screened for any sarcoma trials if available.  Patient voices some anxiety regarding her cancer status.  We discussed referral to social work for continued support and resources.  36 minutes of total time was spent for this patient encounter, including preparation, face-to-face counseling with the patient and coordination of care, and documentation of the encounter.  Jeral Pinch, MD  Division of Gynecologic Oncology  Department of Obstetrics and Gynecology  [facility]

## 2022-11-02 ENCOUNTER — Other Ambulatory Visit: Payer: Self-pay

## 2022-11-02 ENCOUNTER — Encounter: Payer: Self-pay | Admitting: Gynecologic Oncology

## 2022-11-02 ENCOUNTER — Encounter: Payer: Self-pay | Admitting: Hematology and Oncology

## 2022-11-02 ENCOUNTER — Ambulatory Visit
Admission: RE | Admit: 2022-11-02 | Discharge: 2022-11-02 | Disposition: A | Discharge status: Home / Self Care | Payer: BC Managed Care – PPO | Source: Ambulatory Visit | Attending: Radiation Oncology | Admitting: Radiation Oncology

## 2022-11-02 DIAGNOSIS — C55 Malignant neoplasm of uterus, part unspecified: Secondary | ICD-10-CM | POA: Diagnosis not present

## 2022-11-02 LAB — RAD ONC ARIA SESSION SUMMARY
Course Elapsed Days: 1
Plan Fractions Treated to Date: 2
Plan Prescribed Dose Per Fraction: 3 Gy
Plan Total Fractions Prescribed: 10
Plan Total Prescribed Dose: 30 Gy
Reference Point Dosage Given to Date: 6 Gy
Reference Point Session Dosage Given: 3 Gy
Session Number: 2

## 2022-11-02 NOTE — Progress Notes (Signed)
Received notice from [facility] that there is insufficient tissue to test accession [id number].  Resent requisition with accession [id number].

## 2022-11-03 ENCOUNTER — Other Ambulatory Visit: Payer: Self-pay

## 2022-11-03 ENCOUNTER — Telehealth: Payer: Self-pay | Admitting: Licensed Clinical Social Worker

## 2022-11-03 ENCOUNTER — Ambulatory Visit
Admission: RE | Admit: 2022-11-03 | Discharge: 2022-11-03 | Disposition: A | Discharge status: Home / Self Care | Payer: BC Managed Care – PPO | Source: Ambulatory Visit | Attending: Radiation Oncology | Admitting: Radiation Oncology

## 2022-11-03 DIAGNOSIS — C55 Malignant neoplasm of uterus, part unspecified: Secondary | ICD-10-CM | POA: Diagnosis not present

## 2022-11-03 DIAGNOSIS — C7931 Secondary malignant neoplasm of brain: Secondary | ICD-10-CM

## 2022-11-03 LAB — RAD ONC ARIA SESSION SUMMARY
Course Elapsed Days: 2
Plan Fractions Treated to Date: 3
Plan Prescribed Dose Per Fraction: 3 Gy
Plan Total Fractions Prescribed: 10
Plan Total Prescribed Dose: 30 Gy
Reference Point Dosage Given to Date: 9 Gy
Reference Point Session Dosage Given: 3 Gy
Session Number: 3

## 2022-11-03 MED ORDER — GADOPICLENOL 0.5 MMOL/ML IV SOLN
6.0000 mL | Freq: Once | INTRAVENOUS | Status: AC | PRN
  Administered 2022-11-03: 6 mL via INTRAVENOUS

## 2022-11-03 NOTE — Telephone Encounter (Signed)
[place] Work  Clinical Social Work was referred by medical provider for assessment of psychosocial needs.  Clinical Social Worker contacted patient by phone  to offer support and assess for needs.    Patient is potentially open to counseling to process everything that is happening with her cancer but feels she still wants to think about it and when she would want to do it. She is very busy with appointments currently and does not know if she wants to schedule another visit at this time.  CSW provided direct contact information and pt stated she will call when ready.   [place], [place] Worker [employer]

## 2022-11-04 ENCOUNTER — Other Ambulatory Visit: Payer: Self-pay

## 2022-11-04 ENCOUNTER — Ambulatory Visit
Admission: RE | Admit: 2022-11-04 | Discharge: 2022-11-04 | Disposition: A | Discharge status: Home / Self Care | Payer: BC Managed Care – PPO | Source: Ambulatory Visit | Attending: Radiation Oncology | Admitting: Radiation Oncology

## 2022-11-04 DIAGNOSIS — C55 Malignant neoplasm of uterus, part unspecified: Secondary | ICD-10-CM | POA: Diagnosis not present

## 2022-11-04 LAB — RAD ONC ARIA SESSION SUMMARY
Course Elapsed Days: 3
Plan Fractions Treated to Date: 4
Plan Prescribed Dose Per Fraction: 3 Gy
Plan Total Fractions Prescribed: 10
Plan Total Prescribed Dose: 30 Gy
Reference Point Dosage Given to Date: 12 Gy
Reference Point Session Dosage Given: 3 Gy
Session Number: 4

## 2022-11-05 ENCOUNTER — Other Ambulatory Visit: Payer: Self-pay

## 2022-11-05 ENCOUNTER — Ambulatory Visit
Admission: RE | Admit: 2022-11-05 | Discharge: 2022-11-05 | Disposition: A | Discharge status: Home / Self Care | Payer: BC Managed Care – PPO | Source: Ambulatory Visit | Attending: Radiation Oncology | Admitting: Radiation Oncology

## 2022-11-05 ENCOUNTER — Telehealth: Payer: Self-pay

## 2022-11-05 ENCOUNTER — Ambulatory Visit (HOSPITAL_COMMUNITY)
Admission: RE | Admit: 2022-11-05 | Discharge: 2022-11-05 | Disposition: A | Discharge status: Home / Self Care | Payer: BC Managed Care – PPO | Source: Ambulatory Visit | Attending: Hematology and Oncology | Admitting: Hematology and Oncology

## 2022-11-05 DIAGNOSIS — Z0189 Encounter for other specified special examinations: Secondary | ICD-10-CM

## 2022-11-05 DIAGNOSIS — C55 Malignant neoplasm of uterus, part unspecified: Secondary | ICD-10-CM | POA: Diagnosis present

## 2022-11-05 DIAGNOSIS — I427 Cardiomyopathy due to drug and external agent: Secondary | ICD-10-CM | POA: Diagnosis not present

## 2022-11-05 LAB — RAD ONC ARIA SESSION SUMMARY
Course Elapsed Days: 4
Plan Fractions Treated to Date: 5
Plan Prescribed Dose Per Fraction: 3 Gy
Plan Total Fractions Prescribed: 10
Plan Total Prescribed Dose: 30 Gy
Reference Point Dosage Given to Date: 15 Gy
Reference Point Session Dosage Given: 3 Gy
Session Number: 5

## 2022-11-05 LAB — ECHOCARDIOGRAM COMPLETE
Area-P 1/2: 4.63 cm2
Calc EF: 52 %
P 1/2 time: 424 msec
S' Lateral: 2.9 cm
Single Plane A2C EF: 53 %
Single Plane A4C EF: 52.2 %

## 2022-11-05 NOTE — Progress Notes (Signed)
Radiation Oncology         [phone] ________________________________  Initial Outpatient Consultation  Name: Tammie Gilmore MRN: [id number]  Date: 11/08/2022  DOB: 1966/02/13  WS:FKCLEXN, [name], MD  Gery Pray, MD   REFERRING PHYSICIAN: Gery Pray, MD  DIAGNOSIS:     ICD-10-CM   1. Metastasis to brain [facility])  C79.31       The primary encounter diagnosis was Metastasis to bone [facility]). A diagnosis of Metastasis to brain [facility]) was also pertinent to this visit.   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 The patient recently began receiving radiation to her skull metastasis on 10/31/22 under the care of Dr. Sondra Come. Prior to this, the patient received radiation to a solitary right external iliac nodal metastasis. Her history pertaining to her metastatic disease to the brain is detailed below from her consultation with Dr. Sondra Come on 10/26/22. (See consult note from 02/22/22 for an outline of her initial diagnosis of uterine leiomyosarcoma).  In the interval since the patient was seen for re-evaluation my Dr. Sondra Come on 10/26/22, the patient followed up with Dr. Alvy Bimler on 10/28/22. Given her  disease progression following chemotherapy (and with prior radiation), Dr. Alvy Bimler has expressed concern that her disease may be resistant to chemotherapy. That being said, Dr. Alvy Bimler has sent a tissue specimen for molecular profiling to investigate this further. Dr. Alvy Bimler has also discussed third line therapy options with the patient including trabectedin versus gemcitabine/Taxotere. The patient later expressed to Dr. Alvy Bimler that she is hesitant to take trabectedin. (The patient did have a great response in the past to gemcitabine and  docetaxel - completed 10 cycles on 05/31/22).   The patient also followed up with Dr. Berline Lopes on 11/01/22 for a second opinion regarding systemic treatment. Based on the patient's excellent performance status and overall health, Dr. Berline Lopes does think that she will be a good candidate to pursue additional lines of therapy to slow progression of disease while maintaining some quality of life. Given her multiple prior lines of treatment and prior development of cardiomyopathy on doxorubicin, Dr. Berline Lopes has offered the patient the option of referral to the bone and soft tissue oncology group at [facility]. This would allow the patient to be screened for any sarcoma trials if available.   Her most recent MRI of the brain on 11/03/22 shows a slight increase in size of the left frontoparietal metastasis, measuring 11 mm, previously 9 mm, and an increase in mild edema associated with the left frontoparietal metastasis. MRI also demonstrates three punctate enhancing cortical lesions in the right frontal lobe measuring approximately 1.5 mm each, which were not apparent on her prior MRI. There is no edema associated with these lesions. MRI also showed interval stability of the destructive midline occipital skull lesion, with extension into the overlying scalp soft tissues, and with unchanged associated mild dural thickening over both occipital poles.  Of note: the patient also had an echo performed last Friday (11/05/22) which showed normal function.   HPI Re-Consultation with Dr. Sondra Come 10/26/2022: Tammie Gilmore is a 57 y.o. female who is accompanied by her husband. she is seen as a courtesy of Dr. Alvy Bimler for re-evaluation and an opinion concerning radiation therapy as part of management for her recent progression of uterine  leiomyosarcoma with widespread metastatic disease. The patient was last seen in December 2023 for her final weekly treatment of radiation for her pelvic nodal metastasis. She did not present for her  scheduled 1 month post-RT follow-up.    Since that time, the patient followed up with Dr. Elson Areas on 09/28/22. During which time, the patient was noted to have a palpable bony lesion on the back of her skull.    X-ray of the skull on 10/13/22 revealed an area of focal soft tissue swelling along the occipital region, overlying an area consisting of a calvarial lytic lesion with a permeative appearance, measuring up to 3.2 cm in craniocaudal dimension.    Accordingly, the patient followed up with Dr. Alvy Bimler on 10/14/22. Given her new metastatic disease to the skull, Dr. Alvy Bimler advised proceeding with an MRI of the brain and complete CT imaging to reassess her cancer burden. Dr. Alvy Bimler would also like her to get back on chemotherapy (pending imaging results).    MRI of the brain on 10/20/22 further revealed a 9 mm left frontoparietal mass with mild edema consistent with a solitary brain metastasis. MRI also redemonstrated the 3 cm midline occipital skull metastasis with extension into the overlying scalp soft tissues.    CT CAP on 10/22/22 also revealed: multiple new and enlarged bilateral pulmonary nodules consistent with worsened pulmonary metastatic disease; interval enlargement of a necrotic appearing right external iliac lymph node, consistent with worsened nodal metastatic disease; and multiple new small peritoneal nodules, consistent with peritoneal metastatic disease     PREVIOUS RADIATION THERAPY: Yes   Intent: Curative Radiation Treatment Dates: 09/14/2022 through 09/24/2022 Site Technique Total Dose (Gy) Dose per Fx (Gy) Completed Fx Beam Energies  Rt External Iliac Node: Pelvis IMRT 50/50 10 5/5 6XFFF    Intent: Palliative Radiation Treatment Dates: 03/10/2022 through 03/23/2022 Site Technique Total Dose (Gy) Dose per Fx (Gy) Completed Fx Beam Energies  Lumbar Spine: Spine 3D 30/30 3 10/10 10X, 15X        PAST MEDICAL HISTORY:  has a past medical history of History of radiation  therapy, History of radiation therapy, Hypoglycemia, and IBS (irritable bowel syndrome).    PAST SURGICAL HISTORY: Past Surgical History:  Procedure Laterality Date   HERNIA REPAIR  1994   left inguinal, with mesh   IR BONE TUMOR(S)RF ABLATION  08/26/2022   IR IMAGING GUIDED PORT INSERTION  08/13/2021   IR KYPHO LUMBAR INC FX REDUCE BONE BX UNI/BIL CANNULATION INC/IMAGING  08/26/2022   IR RADIOLOGIST EVAL & MGMT  08/03/2022   IR RADIOLOGIST EVAL & MGMT  09/09/2022    FAMILY HISTORY: family history includes Endometriosis in her mother; Heart attack in her father and mother; Heart disease in her father and mother.  SOCIAL HISTORY:  reports that she has never smoked. She has never used smokeless tobacco. She reports that she does not drink alcohol and does not use drugs.  ALLERGIES: Doxycycline  MEDICATIONS:  Current Outpatient Medications  Medication Sig Dispense Refill   acetaminophen (TYLENOL) 500 MG tablet Take 1,000 mg by mouth every 6 (six) hours as needed for moderate pain or headache.     calcium carbonate (TUMS - DOSED IN MG ELEMENTAL CALCIUM) 500 MG chewable tablet Chew 1 tablet by mouth 2 (two) times daily.     cholecalciferol (VITAMIN D3) 25 MCG (1000 UNIT) tablet Take 2,000 Units by mouth daily.     dexamethasone (DECADRON) 4 MG tablet Take 0.5 tablets (2 mg total) by mouth daily.  estradiol (ESTRACE) 0.1 MG/GM vaginal cream PLACE FINGER TIP SIZE AMOUNT OF CREAM AND INSERT SLIGHTLY PAST THE VAGINAL ENTRANCE 3 TIMES DAILY (Patient not taking: Reported on 08/03/2022) 126 g 4   lidocaine (XYLOCAINE) 2 % solution SMARTSIG:By Mouth     lidocaine-prilocaine (EMLA) cream Apply to affected area once 30 g 3   loratadine (CLARITIN) 10 MG tablet Take 10 mg by mouth daily as needed (for bone aches).     LORazepam (ATIVAN) 0.5 MG tablet Take 1 tablet (0.5 mg total) by mouth 2 (two) times daily as needed for anxiety. (Patient not taking: Reported on 04/26/2022) 30 tablet 0   LORazepam  (ATIVAN) 1 MG tablet Take 1 tablet (1 mg total) by mouth as needed for anxiety. 1 hour prior to MRI, radiation planning and treatment 15 tablet 0   magic mouthwash (nystatin, diphenhydrAMINE, alum & mag hydroxide) suspension mixture Swish and spit 5 mLs 4 (four) times daily as needed for mouth pain. 240 mL 0   metoprolol succinate (TOPROL XL) 25 MG 24 hr tablet Take 1 tablet (25 mg total) by mouth at bedtime. 30 tablet 6   ondansetron (ZOFRAN) 8 MG tablet Take 1 tablet (8 mg total) by mouth every 8 (eight) hours as needed. (Patient not taking: Reported on 08/03/2022) 30 tablet 1   prochlorperazine (COMPAZINE) 10 MG tablet Take 1 tablet (10 mg total) by mouth every 6 (six) hours as needed (Nausea or vomiting). 90 tablet 1   senna (SENOKOT) 8.6 MG TABS tablet Take 2 tablets by mouth at bedtime.     No current facility-administered medications for this encounter.    REVIEW OF SYSTEMS:  As above.   PHYSICAL EXAM:  vitals were not taken for this visit.   General: Alert and oriented, in no acute distress *** HEENT: Head is normocephalic. Extraocular movements are intact. Oropharynx is clear. Neck: Neck is supple, no palpable cervical or supraclavicular lymphadenopathy. Heart: Regular in rate and rhythm with no murmurs, rubs, or gallops. Chest: Clear to auscultation bilaterally, with no rhonchi, wheezes, or rales. Abdomen: Soft, nontender, nondistended, with no rigidity or guarding. Extremities: No cyanosis or edema. Lymphatics: see Neck Exam Skin: No concerning lesions. Musculoskeletal: symmetric strength and muscle tone throughout. Neurologic: Cranial nerves II through XII are grossly intact. No obvious focalities. Speech is fluent. Coordination is intact. Psychiatric: Judgment and insight are intact. Affect is appropriate.   LABORATORY DATA:  Lab Results  Component Value Date   WBC 3.6 (L) 10/28/2022   HGB 12.6 10/28/2022   HCT 37.2 10/28/2022   MCV 91.0 10/28/2022   PLT 202 10/28/2022    CMP     Component Value Date/Time   NA 141 10/28/2022 1337   K 4.0 10/28/2022 1337   CL 106 10/28/2022 1337   CO2 29 10/28/2022 1337   GLUCOSE 88 10/28/2022 1337   BUN 14 10/28/2022 1337   CREATININE 0.77 10/28/2022 1337   CALCIUM 9.1 10/28/2022 1337   PROT 5.8 (L) 10/28/2022 1337   ALBUMIN 3.6 10/28/2022 1337   AST 14 (L) 10/28/2022 1337   ALT 13 10/28/2022 1337   ALKPHOS 64 10/28/2022 1337   BILITOT 0.3 10/28/2022 1337   GFRNONAA >60 10/28/2022 1337         RADIOGRAPHY: ECHOCARDIOGRAM COMPLETE  Result Date: 11/05/2022    ECHOCARDIOGRAM REPORT   Patient Name:   Tammie Gilmore Date of Exam: 11/05/2022 Medical Rec #:  [id number]         Height:  68.0 in Accession #:    [id number]        Weight:       129.4 lb Date of Birth:  08/14/1966        BSA:          1.698 m Patient Age:    49 years          BP:           160/80 mmHg Patient Gender: F                 HR:           83 bpm. Exam Location:  Outpatient Procedure: 2D Echo, Color Doppler, Cardiac Doppler and Strain Analysis Indications:    Chemo Z09  History:        Patient has prior history of Echocardiogram examinations, most                 recent 01/19/2022.  Sonographer:    Bernadene Person RDCS Referring Phys: [id number] NI [place]  Sonographer Comments: Global longitudinal strain was attempted. IMPRESSIONS  1. Left ventricular ejection fraction, by estimation, is 60 to 65%. The left ventricle has normal function. The left ventricle has no regional wall motion abnormalities. Left ventricular diastolic parameters were normal. The average left ventricular global longitudinal strain is -15.0 %. The global longitudinal strain is abnormal.  2. Right ventricular systolic function is normal. The right ventricular size is normal. Tricuspid regurgitation signal is inadequate for assessing PA pressure.  3. No evidence of mitral valve regurgitation.  4. The aortic valve is grossly normal. Aortic valve regurgitation is not visualized.  5. The  inferior vena cava is normal in size with greater than 50% respiratory variability, suggesting right atrial pressure of 3 mmHg. Conclusion(s)/Recommendation(s): GLS stable; mildy abnormal. Safe to continue therapy; can consider better blood pressure control. FINDINGS  Left Ventricle: Left ventricular ejection fraction, by estimation, is 60 to 65%. The left ventricle has normal function. The left ventricle has no regional wall motion abnormalities. The average left ventricular global longitudinal strain is -15.0 %. The global longitudinal strain is abnormal. The left ventricular internal cavity size was normal in size. There is no left ventricular hypertrophy. Left ventricular diastolic parameters were normal. Right Ventricle: The right ventricular size is normal. Right ventricular systolic function is normal. Tricuspid regurgitation signal is inadequate for assessing PA pressure. Left Atrium: Left atrial size was normal in size. Right Atrium: Right atrial size was normal in size. Pericardium: There is no evidence of pericardial effusion. Mitral Valve: No evidence of mitral valve regurgitation. Tricuspid Valve: Tricuspid valve regurgitation is not demonstrated. Aortic Valve: The aortic valve is grossly normal. Aortic valve regurgitation is not visualized. Aortic regurgitation PHT measures 424 msec. Pulmonic Valve: Pulmonic valve regurgitation is not visualized. Aorta: The aortic root and ascending aorta are structurally normal, with no evidence of dilitation. Venous: The inferior vena cava is normal in size with greater than 50% respiratory variability, suggesting right atrial pressure of 3 mmHg. IAS/Shunts: No atrial level shunt detected by color flow Doppler.  LEFT VENTRICLE PLAX 2D LVIDd:         4.00 cm     Diastology LVIDs:         2.90 cm     LV e' medial:    7.22 cm/s LV PW:         0.80 cm     LV E/e' medial:  8.3 LV IVS:  0.80 cm     LV e' lateral:   8.43 cm/s LVOT diam:     2.00 cm     LV E/e'  lateral: 7.1 LV SV:         41 LV SV Index:   24          2D Longitudinal Strain LVOT Area:     3.14 cm    2D Strain GLS Avg:     -15.0 %  LV Volumes (MOD) LV vol d, MOD A2C: 66.1 ml LV vol d, MOD A4C: 63.4 ml LV vol s, MOD A2C: 31.1 ml LV vol s, MOD A4C: 30.3 ml LV SV MOD A2C:     35.0 ml LV SV MOD A4C:     63.4 ml LV SV MOD BP:      33.9 ml RIGHT VENTRICLE RV S prime:     7.87 cm/s TAPSE (M-mode): 1.4 cm LEFT ATRIUM             Index        RIGHT ATRIUM          Index LA diam:        2.40 cm 1.41 cm/m   RA Area:     8.49 cm LA Vol (A2C):   25.8 ml 15.19 ml/m  RA Volume:   14.50 ml 8.54 ml/m LA Vol (A4C):   21.4 ml 12.60 ml/m LA Biplane Vol: 23.7 ml 13.95 ml/m  AORTIC VALVE LVOT Vmax:   83.60 cm/s LVOT Vmean:  52.600 cm/s LVOT VTI:    0.129 m AI PHT:      424 msec  AORTA Ao Root diam: 3.10 cm Ao Asc diam:  3.20 cm MITRAL VALVE MV Area (PHT): 4.63 cm    SHUNTS MV Decel Time: 164 msec    Systemic VTI:  0.13 m MV E velocity: 60.00 cm/s  Systemic Diam: 2.00 cm MV A velocity: 46.70 cm/s MV E/A ratio:  1.28 Placido Sou signed by Phineas Inches Signature Date/Time: 11/05/2022/1:27:29 PM    Final    MR Brain W Wo Contrast  Result Date: 11/04/2022 CLINICAL DATA:  Metastatic disease evaluation. History of leiomyosarcoma. EXAM: MRI HEAD WITHOUT AND WITH CONTRAST TECHNIQUE: Multiplanar, multiecho pulse sequences of the brain and surrounding structures were obtained without and with intravenous contrast. CONTRAST:  6 mL Vueway COMPARISON:  Head MRI 10/20/2022 FINDINGS: Brain: An 11 mm enhancing lesion in the left frontoparietal operculum has slightly enlarged (series 13, image 101, previously 9 mm) with increased, mild edema. An associated punctate focus of chronic hemorrhage or mineralization is again noted. Three punctate enhancing cortical lesions in the right frontal lobe measuring approximately 1.5 mm each were not apparent on the prior 1.5 T MRI (series 13, images 120, 123, and 137). There is no  edema associated with these lesions. No acute infarct, midline shift, or extra-axial fluid collection is evident. The ventricles are normal in size. Scattered small T2 hyperintensities in the cerebral white matter bilaterally are unchanged and nonspecific but compatible with minimal chronic small vessel ischemic disease. 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 Vascular: Major intracranial vascular flow voids are preserved. Skull and upper cervical spine: Unchanged 3 cm destructive midline occipital skull lesion with extension into the overlying scalp soft tissues and with unchanged associated mild dural thickening over both occipital poles. Sinuses/Orbits: Unremarkable orbits. Mild mucosal thickening in the right maxillary sinus. Clear mastoid air cells. Other: None. IMPRESSION: 1. Slightly  increased size of a left frontoparietal metastasis with increased, mild edema. 2. Three punctate enhancing lesions in the right frontal lobe which were not visible on the prior MRI and also likely reflect metastases. 3. Unchanged occipital skull metastasis. Electronically Signed   By: Logan Bores M.D.   On: 11/04/2022 08:04   NM Bone Scan Whole Body  Result Date: 10/28/2022 CLINICAL DATA:  History of cervical cancer, leiomyosarcoma EXAM: NUCLEAR MEDICINE WHOLE BODY BONE SCAN TECHNIQUE: Whole body anterior and posterior images were obtained approximately 3 hours after intravenous injection of radiopharmaceutical. RADIOPHARMACEUTICALS:  20.3 mCi Technetium-75mMDP IV COMPARISON:  Brain MRI 10/20/2022 and CT chest abdomen pelvis 10/22/2022. 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 This no additional foci of abnormal increased radiotracer uptake identified. Physiologic tracer activity noted within the kidneys an urinary bladder. IMPRESSION:  1. 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 2. No additional foci of abnormal increased uptake suggestive of metastatic disease. Electronically Signed   By: TKerby MoorsM.D.   On: 10/28/2022 08:16   CT CHEST ABDOMEN PELVIS W CONTRAST  Result Date: 10/23/2022 CLINICAL DATA:  Cervical cancer, leiomyosarcoma, status post chemotherapy, assess treatment response * Tracking Code: BO * EXAM: CT CHEST, ABDOMEN, AND PELVIS WITH CONTRAST TECHNIQUE: Multidetector CT imaging of the chest, abdomen and pelvis was performed following the standard protocol during bolus administration of intravenous contrast. RADIATION DOSE REDUCTION: This exam was performed according to the departmental dose-optimization program which includes automated exposure control, adjustment of the mA and/or kV according to patient size and/or use of iterative reconstruction technique. CONTRAST:  1052mOMNIPAQUE IOHEXOL 300 MG/ML  SOLN COMPARISON:  08/20/2022 FINDINGS: CT CHEST FINDINGS Cardiovascular: Right chest port catheter. Normal heart size. No pericardial effusion. Mediastinum/Nodes: No enlarged mediastinal, hilar, or axillary lymph nodes. Thyroid gland, trachea, and esophagus demonstrate no significant findings. Lungs/Pleura: Interval enlargement of a nodule of the anterior right upper lobe measuring 1.0 x 0.8 cm, previously no greater than 0.3 cm (series 7, image 46). Interval enlargement of a nodule of the medial right upper lobe measuring 1.5 x 1.0 cm, previously no greater than 0.5 cm (series 7, image 41). Multiple new nodules, for example a 0.8 x 0.6 cm nodule of the medial left lower lobe (series 7, image 111). No pleural effusion or pneumothorax. Musculoskeletal: No chest wall abnormality. No acute osseous findings. CT ABDOMEN PELVIS FINDINGS Hepatobiliary: No solid liver abnormality is seen. No gallstones, gallbladder wall thickening, or biliary  dilatation. Pancreas: Unremarkable. No pancreatic ductal dilatation or surrounding inflammatory changes. Spleen: Normal in size without significant abnormality. Adrenals/Urinary Tract: Adrenal glands are unremarkable. Kidneys are normal, without renal calculi, solid lesion, or hydronephrosis. Bladder is unremarkable. Stomach/Bowel: Stomach is within normal limits. Appendix appears normal. No evidence of bowel wall thickening, distention, or inflammatory changes. Vascular/Lymphatic: No significant vascular findings are present. Interval enlargement of a necrotic appearing right external iliac lymph node measuring 2.0 x 1.6 cm, previously 1.5 x 1.4 cm (series 2, image 99). Reproductive: Status post hysterectomy. Other: No abdominal wall hernia or abnormality. Trace ascites in the low pelvis (series 2, image 109). Probable prior omentectomy. Multiple new small peritoneal nodules, for example in the midline ventral abdomen measuring 1.7 x 1.2 cm (series 2, image 85) and in the ventral right lower quadrant measuring 1.0 x 0.9 cm (series 2,  image 89). Musculoskeletal: No acute osseous findings. Interval vertebral cement augmentation of a pathologic fracture of L3 (series 6, image 104). IMPRESSION: 1. Multiple new and enlarged bilateral pulmonary nodules, consistent with worsened pulmonary metastatic disease. 2. Interval enlargement of a necrotic appearing right external iliac lymph node, consistent with worsened nodal metastatic disease. 3. Multiple new small peritoneal nodules, consistent with peritoneal metastatic disease. 4. Trace ascites in the low pelvis, presumed malignant. 5. Interval vertebral cement augmentation of a pathologic fracture of L3. 6. Status post hysterectomy. Electronically Signed   By: Delanna Ahmadi M.D.   On: 10/23/2022 15:55   MR Brain W Wo Contrast  Result Date: 10/21/2022 CLINICAL DATA:  Metastatic disease evaluation. History of leiomyosarcoma. Painful posterior skull lump. EXAM: MRI HEAD  WITHOUT AND WITH CONTRAST TECHNIQUE: Multiplanar, multiecho pulse sequences of the brain and surrounding structures were obtained without and with intravenous contrast. CONTRAST:  5.8m GADAVIST GADOBUTROL 1 MMOL/ML IV SOLN COMPARISON:  Skull radiographs 10/13/2022.  Head CT 12/24/2021. FINDINGS: Brain: There is no evidence of an acute infarct, midline shift, or extra-axial fluid collection. The ventricles and sulci are normal. Scattered small T2 hyperintensities in the cerebral white matter bilaterally are nonspecific but compatible with minimal chronic small vessel ischemic disease. 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 An enhancing lesion in the left frontoparietal operculum measures 9 mm and demonstrates a small amount of susceptibility centrally which may reflect blood products or mineralization (series 16, image 91). There is mild surrounding edema without mass effect. Vascular: Major intracranial vascular flow voids are preserved. Skull and upper cervical spine: 3 cm destructive midline occipital skull lesion with associated mild dural thickening over both occipital poles and with tumor extension into the overlying scalp soft tissues. No involvement of the adjacent superior sagittal sinus. Sinuses/Orbits: Unremarkable orbits. Mild mucosal thickening in the paranasal sinuses. Clear mastoid air cells. Other: None. IMPRESSION: 1. 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 2. 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 Electronically Signed   By: ALogan BoresM.D.   On: 10/21/2022 09:50   DG Skull 1-3 Views  Result Date: 10/13/2022 CLINICAL DATA:  History of leiomyosarcoma with new painful palpable area in the right posterior skull. No known injury. EXAM: SKULL - 1-3 VIEW COMPARISON:  CT head dated 12/24/2021 FINDINGS: Focal soft tissue swelling along the occipital  seen on lateral view, not well assessed on the frontal view, overlying an area of calvarial lytic lesion with permeative appearance measuring up to 3.2 cm in craniocaudal dimension. IMPRESSION: Focal soft tissue swelling along the occipital region seen on lateral view may correspond to palpable area of clinical concern. Lytic calvarial lesion underlying this area measures 3.2 cm in craniocaudal dimension, suspicious for osseous metastasis in the setting of known malignancy. Recommend contrast-enhanced MRI or CT for further evaluation. These results will be called to the ordering clinician or representative by the Radiologist Assistant, and communication documented in the PACS or [employer] Electronically Signed   By: LDarrin NipperM.D.   On: 10/13/2022 16:42      IMPRESSION/PLAN: This is a very pleasant *** year old *** with metastatic disease to the brain.  I had a lengthy discussion with the patient after reviewing their MRI results with them.  We spoke about whole brain radiotherapy versus stereotactic radiosurgery to the brain. We spoke about the differing risks benefits and side effects of  both of these treatments. During part of our discussion, we spoke about the hair loss, fatigue and cognitive effects that can result from whole brain radiotherapy.  Additionally, we spoke about radionecrosis that can result from stereotactic radiosurgery. I explained that whole brain radiotherapy is more comprehensive and therefore can decrease the chance of recurrences elsewhere in the brain, while stereotactic radiosurgery only treats the areas of gross disease while sparing the rest of the brain parenchyma.  After lengthy discussion, the patient would like to proceed with stereotactic brain radiosurgery to their metastatic disease. They will meet with neurosurgery in the near future to discuss this further; a neurosurgeon will participate in their case.  CT simulation will take place on *** and treatment on ***.   I plan to deliver *** Gy in 1 fraction to ***.  On date of service, in total, I spent *** minutes on this encounter. Patient was seen in person.   __________________________________________   Eppie Gibson, MD  This document serves as a record of services personally performed by Eppie Gibson, MD. It was created on her behalf by Roney Mans, a trained medical scribe. The creation of this record is based on the scribe's personal observations and the provider's statements to them. This document has been checked and approved by the attending provider.

## 2022-11-05 NOTE — Telephone Encounter (Signed)
Called per Dr. Alvy Bimler and told her Echo showed normal function. She verbalized understanding.

## 2022-11-05 NOTE — Progress Notes (Signed)
Echocardiogram 2D Echocardiogram has been performed.  Tammie Gilmore 11/05/2022, 9:07 AM

## 2022-11-08 ENCOUNTER — Ambulatory Visit
Admission: RE | Admit: 2022-11-08 | Discharge: 2022-11-08 | Disposition: A | Discharge status: Home / Self Care | Payer: BC Managed Care – PPO | Source: Ambulatory Visit | Attending: Radiation Oncology | Admitting: Radiation Oncology

## 2022-11-08 ENCOUNTER — Other Ambulatory Visit: Payer: Self-pay

## 2022-11-08 ENCOUNTER — Telehealth: Payer: Self-pay

## 2022-11-08 ENCOUNTER — Encounter: Payer: Self-pay | Admitting: Radiation Oncology

## 2022-11-08 VITALS — BP 134/90 | HR 100 | Temp 97.5°F | Resp 18 | Ht 68.0 in | Wt 127.2 lb

## 2022-11-08 DIAGNOSIS — C7931 Secondary malignant neoplasm of brain: Secondary | ICD-10-CM | POA: Insufficient documentation

## 2022-11-08 DIAGNOSIS — C7951 Secondary malignant neoplasm of bone: Secondary | ICD-10-CM | POA: Diagnosis not present

## 2022-11-08 DIAGNOSIS — C7800 Secondary malignant neoplasm of unspecified lung: Secondary | ICD-10-CM | POA: Diagnosis not present

## 2022-11-08 DIAGNOSIS — C541 Malignant neoplasm of endometrium: Secondary | ICD-10-CM | POA: Diagnosis not present

## 2022-11-08 DIAGNOSIS — C778 Secondary and unspecified malignant neoplasm of lymph nodes of multiple regions: Secondary | ICD-10-CM | POA: Insufficient documentation

## 2022-11-08 DIAGNOSIS — Z7952 Long term (current) use of systemic steroids: Secondary | ICD-10-CM | POA: Insufficient documentation

## 2022-11-08 DIAGNOSIS — Z923 Personal history of irradiation: Secondary | ICD-10-CM | POA: Insufficient documentation

## 2022-11-08 DIAGNOSIS — C55 Malignant neoplasm of uterus, part unspecified: Secondary | ICD-10-CM | POA: Diagnosis not present

## 2022-11-08 DIAGNOSIS — R609 Edema, unspecified: Secondary | ICD-10-CM | POA: Diagnosis not present

## 2022-11-08 LAB — RAD ONC ARIA SESSION SUMMARY
Course Elapsed Days: 7
Plan Fractions Treated to Date: 6
Plan Prescribed Dose Per Fraction: 3 Gy
Plan Total Fractions Prescribed: 10
Plan Total Prescribed Dose: 30 Gy
Reference Point Dosage Given to Date: 18 Gy
Reference Point Session Dosage Given: 3 Gy
Session Number: 6

## 2022-11-08 MED ORDER — HEPARIN SOD (PORK) LOCK FLUSH 100 UNIT/ML IV SOLN
500.0000 [IU] | Freq: Once | INTRAVENOUS | Status: AC
  Administered 2022-11-08: 500 [IU] via INTRAVENOUS

## 2022-11-08 MED ORDER — SODIUM CHLORIDE 0.9% FLUSH
3.0000 mL | Freq: Once | INTRAVENOUS | Status: AC
  Administered 2022-11-08: 3 mL via INTRAVENOUS

## 2022-11-08 NOTE — Telephone Encounter (Signed)
Returned her call. She is asking about getting chemo prior to appt at [facility] on 2/2. Told her Dr. Alvy Bimler does not plan on prescribing chemo until she is seen at [facility]. Santiago Glad, navigator will keep a eye on Caris testing and will call her when the results come back.  She verbalized understanding and appreciated the call.

## 2022-11-08 NOTE — Progress Notes (Incomplete)
Has armband been applied?  Yes.    Does patient have an allergy to IV contrast dye?: No.   Has patient ever received premedication for IV contrast dye?: No.   Does patient take metformin?: No.  If patient does take metformin when was the last dose:  none  Date of lab work:  BUN: 14 CR: 0.77 eGfr: greater than 60  IV site: right chest port  Has IV site been added to flowsheet?  Yes.    There were no vitals taken for this visit.

## 2022-11-09 ENCOUNTER — Other Ambulatory Visit: Payer: Self-pay

## 2022-11-09 ENCOUNTER — Encounter: Payer: Self-pay | Admitting: Radiation Oncology

## 2022-11-09 ENCOUNTER — Ambulatory Visit
Admission: RE | Admit: 2022-11-09 | Discharge: 2022-11-09 | Disposition: A | Discharge status: Home / Self Care | Payer: BC Managed Care – PPO | Source: Ambulatory Visit | Attending: Radiation Oncology | Admitting: Radiation Oncology

## 2022-11-09 DIAGNOSIS — C55 Malignant neoplasm of uterus, part unspecified: Secondary | ICD-10-CM | POA: Diagnosis not present

## 2022-11-09 LAB — RAD ONC ARIA SESSION SUMMARY
Course Elapsed Days: 8
Plan Fractions Treated to Date: 7
Plan Prescribed Dose Per Fraction: 3 Gy
Plan Total Fractions Prescribed: 10
Plan Total Prescribed Dose: 30 Gy
Reference Point Dosage Given to Date: 21 Gy
Reference Point Session Dosage Given: 3 Gy
Session Number: 7

## 2022-11-10 ENCOUNTER — Ambulatory Visit
Admission: RE | Admit: 2022-11-10 | Discharge: 2022-11-10 | Disposition: A | Discharge status: Home / Self Care | Payer: BC Managed Care – PPO | Source: Ambulatory Visit | Attending: Radiation Oncology | Admitting: Radiation Oncology

## 2022-11-10 ENCOUNTER — Other Ambulatory Visit: Payer: Self-pay

## 2022-11-10 DIAGNOSIS — C55 Malignant neoplasm of uterus, part unspecified: Secondary | ICD-10-CM | POA: Diagnosis not present

## 2022-11-10 LAB — RAD ONC ARIA SESSION SUMMARY
Course Elapsed Days: 9
Plan Fractions Treated to Date: 8
Plan Prescribed Dose Per Fraction: 3 Gy
Plan Total Fractions Prescribed: 10
Plan Total Prescribed Dose: 30 Gy
Reference Point Dosage Given to Date: 24 Gy
Reference Point Session Dosage Given: 3 Gy
Session Number: 8

## 2022-11-11 ENCOUNTER — Other Ambulatory Visit: Payer: Self-pay

## 2022-11-11 ENCOUNTER — Ambulatory Visit
Admission: RE | Admit: 2022-11-11 | Discharge: 2022-11-11 | Disposition: A | Discharge status: Home / Self Care | Payer: BC Managed Care – PPO | Source: Ambulatory Visit | Attending: Radiation Oncology | Admitting: Radiation Oncology

## 2022-11-11 ENCOUNTER — Encounter: Payer: Self-pay | Admitting: Hematology and Oncology

## 2022-11-11 DIAGNOSIS — C55 Malignant neoplasm of uterus, part unspecified: Secondary | ICD-10-CM | POA: Insufficient documentation

## 2022-11-11 DIAGNOSIS — C775 Secondary and unspecified malignant neoplasm of intrapelvic lymph nodes: Secondary | ICD-10-CM | POA: Diagnosis present

## 2022-11-11 DIAGNOSIS — C7951 Secondary malignant neoplasm of bone: Secondary | ICD-10-CM | POA: Insufficient documentation

## 2022-11-11 DIAGNOSIS — Z510 Encounter for antineoplastic radiation therapy: Secondary | ICD-10-CM | POA: Diagnosis not present

## 2022-11-11 LAB — RAD ONC ARIA SESSION SUMMARY
Course Elapsed Days: 10
Plan Fractions Treated to Date: 9
Plan Prescribed Dose Per Fraction: 3 Gy
Plan Total Fractions Prescribed: 10
Plan Total Prescribed Dose: 30 Gy
Reference Point Dosage Given to Date: 27 Gy
Reference Point Session Dosage Given: 3 Gy
Session Number: 9

## 2022-11-12 ENCOUNTER — Ambulatory Visit
Admission: RE | Admit: 2022-11-12 | Discharge: 2022-11-12 | Disposition: A | Discharge status: Home / Self Care | Payer: BC Managed Care – PPO | Source: Ambulatory Visit | Attending: Radiation Oncology | Admitting: Radiation Oncology

## 2022-11-12 ENCOUNTER — Other Ambulatory Visit: Payer: Self-pay

## 2022-11-12 DIAGNOSIS — I5020 Unspecified systolic (congestive) heart failure: Secondary | ICD-10-CM | POA: Insufficient documentation

## 2022-11-12 DIAGNOSIS — C775 Secondary and unspecified malignant neoplasm of intrapelvic lymph nodes: Secondary | ICD-10-CM | POA: Diagnosis not present

## 2022-11-12 LAB — RAD ONC ARIA SESSION SUMMARY
Course Elapsed Days: 11
Plan Fractions Treated to Date: 10
Plan Prescribed Dose Per Fraction: 3 Gy
Plan Total Fractions Prescribed: 10
Plan Total Prescribed Dose: 30 Gy
Reference Point Dosage Given to Date: 30 Gy
Reference Point Session Dosage Given: 3 Gy
Session Number: 10

## 2022-11-15 ENCOUNTER — Ambulatory Visit
Admission: RE | Admit: 2022-11-15 | Discharge: 2022-11-15 | Disposition: A | Discharge status: Home / Self Care | Payer: BC Managed Care – PPO | Source: Ambulatory Visit | Attending: Radiation Oncology | Admitting: Radiation Oncology

## 2022-11-15 ENCOUNTER — Other Ambulatory Visit: Payer: Self-pay

## 2022-11-15 DIAGNOSIS — C775 Secondary and unspecified malignant neoplasm of intrapelvic lymph nodes: Secondary | ICD-10-CM | POA: Diagnosis not present

## 2022-11-15 LAB — RAD ONC ARIA SESSION SUMMARY
Course Elapsed Days: 14
Plan Fractions Treated to Date: 1
Plan Prescribed Dose Per Fraction: 20 Gy
Plan Total Fractions Prescribed: 1
Plan Total Prescribed Dose: 20 Gy
Reference Point Dosage Given to Date: 20 Gy
Reference Point Session Dosage Given: 20 Gy
Session Number: 11

## 2022-11-15 NOTE — Radiation Completion Notes (Signed)
Patient Name: Tammie Gilmore, Tammie Gilmore MRN: [id number] Date of Birth: Nov 16, 1965 Referring Physician: [name], M.D. Date of Service: 2022-11-15 Radiation Oncologist: Teryl Lucy, M.D. 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 Intent: Palliative     ==========DELIVERED PLANS==========  First Treatment Date: 2022-11-01 - Last Treatment Date: 2022-11-15   Plan Name: Brain_Cranium Site: Cranium Technique: 3D Mode: Photon Dose Per Fraction: 3 Gy Prescribed Dose (Delivered / Prescribed): 30 Gy / 30 Gy Prescribed Fxs (Delivered / Prescribed): 10 / 10   Plan Name: Brain_SRS Site: Brain Technique: SBRT/SRT-IMRT Mode: Photon Dose Per Fraction: 20 Gy Prescribed Dose (Delivered / Prescribed): 20 Gy / 20 Gy Prescribed Fxs (Delivered / Prescribed): 1 / 1     ==========ON TREATMENT VISIT DATES========== 2022-11-02, 2022-11-09, 2022-11-15     ==========UPCOMING VISITS==========       ==========APPENDIX - ON TREATMENT VISIT NOTES==========   See weekly On Treatment Notes is Epic for details.

## 2022-11-15 NOTE — Progress Notes (Addendum)
Nurse monitoring complete status post SRS treatment. Patient without complaints. Patient denies new or worsening neurologic symptoms. She was monitored for 30 minutes without complication. Vitals stable. Instructed patient to avoid strenuous activity for the next 24 hours. Instructed patient to call [phone] with needs related to treatment after hours or over the weekend. Patient verbalized understanding   Pt was able to walk of out of clinic without complication with family at bedside.   Vitals:   11/15/22 1554  BP: (!) 150/93  Pulse: 84  Resp: 18  Temp: 97.7 F (36.5 C)  SpO2: 100%

## 2022-11-16 ENCOUNTER — Telehealth: Payer: Self-pay | Admitting: Oncology

## 2022-11-16 NOTE — Procedures (Signed)
  Name: Tammie Gilmore  MRN: [id number]  Date: 11/15/2022   DOB: 1966-06-19  Stereotactic Radiosurgery Operative Note  PRE-OPERATIVE DIAGNOSIS:  Multiple Brain Metastases  POST-OPERATIVE DIAGNOSIS:  Multiple Brain Metastases  PROCEDURE:  Stereotactic Radiosurgery  SURGEON:  Elaina Hoops, MD  NARRATIVE: The patient underwent a radiation treatment planning session in the radiation oncology simulation suite under the care of the radiation oncology physician and physicist.  I participated closely in the radiation treatment planning afterwards. The patient underwent planning CT which was fused to 3T high resolution MRI with 1 mm axial slices.  These images were fused on the planning system.  We contoured the gross target volumes and subsequently expanded this to yield the Planning Target Volume. I actively participated in the planning process.  I helped to define and review the target contours and also the contours of the optic pathway, eyes, brainstem and selected nearby organs at risk.  All the dose constraints for critical structures were reviewed and compared to AAPM Task Group 101.  The prescription dose conformity was reviewed.  I approved the plan electronically.    Accordingly, Tammie Gilmore was brought to the TrueBeam stereotactic radiation treatment linac and placed in the custom immobilization mask.  The patient was aligned according to the IR fiducial markers with BrainLab Exactrac, then orthogonal x-rays were used in ExacTrac with the 6DOF robotic table and the shifts were made to align the patient  Tammie Gilmore received stereotactic radiosurgery uneventfully.    Lesions treated:  4   Complex lesions treated:  4 (>3.5 cm, <39m of optic path, or within the brainstem)   The detailed description of the procedure is recorded in the radiation oncology procedure note.  I was present for the duration of the procedure.  DISPOSITION:  Following delivery, the patient was transported to  nursing in stable condition and monitored for possible acute effects to be discharged to home in stable condition with follow-up in one month.  GElaina Hoops MD 11/16/2022 8:30 AM

## 2022-11-16 NOTE — Telephone Encounter (Signed)
Tammie Gilmore and offered her an appointment today at 2:00 with Dr. Alvy Gilmore to discuss treatment.  Tammie Gilmore is not able to come in today.  Scheduled appointment on Thursday, 11/18/22 at 1:00. She verbalized understanding and agreement.   Also discussed the Caris testing and that we requested to run it on her surgery sample from [facility] since her uterine biopsy did not have enough tissue.  Advised her that Tammie Gilmore has notified us that there has been a delay in getting the tissue blocks from [facility] and that I will let her know once Caris receives it.

## 2022-11-17 ENCOUNTER — Other Ambulatory Visit: Payer: Self-pay | Admitting: Hematology and Oncology

## 2022-11-18 ENCOUNTER — Other Ambulatory Visit: Payer: Self-pay

## 2022-11-18 ENCOUNTER — Encounter: Payer: Self-pay | Admitting: Hematology and Oncology

## 2022-11-18 ENCOUNTER — Inpatient Hospital Stay: Payer: BC Managed Care – PPO | Attending: Gynecologic Oncology | Admitting: Hematology and Oncology

## 2022-11-18 VITALS — BP 150/83 | HR 91 | Temp 99.0°F | Resp 18 | Ht 68.0 in | Wt 129.6 lb

## 2022-11-18 DIAGNOSIS — Z5111 Encounter for antineoplastic chemotherapy: Secondary | ICD-10-CM | POA: Diagnosis not present

## 2022-11-18 DIAGNOSIS — C7800 Secondary malignant neoplasm of unspecified lung: Secondary | ICD-10-CM

## 2022-11-18 DIAGNOSIS — C7951 Secondary malignant neoplasm of bone: Secondary | ICD-10-CM | POA: Diagnosis not present

## 2022-11-18 DIAGNOSIS — Z7952 Long term (current) use of systemic steroids: Secondary | ICD-10-CM | POA: Diagnosis not present

## 2022-11-18 DIAGNOSIS — Z923 Personal history of irradiation: Secondary | ICD-10-CM | POA: Insufficient documentation

## 2022-11-18 DIAGNOSIS — I700 Atherosclerosis of aorta: Secondary | ICD-10-CM | POA: Insufficient documentation

## 2022-11-18 DIAGNOSIS — C55 Malignant neoplasm of uterus, part unspecified: Secondary | ICD-10-CM | POA: Diagnosis not present

## 2022-11-18 DIAGNOSIS — R918 Other nonspecific abnormal finding of lung field: Secondary | ICD-10-CM | POA: Insufficient documentation

## 2022-11-18 NOTE — Progress Notes (Signed)
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[facility]) I have reviewed documentation from recent consult at [facility] We discussed the risk, benefits, side effects of resumption of gemcitabine with Taxotere versus switching over to trabectedin or pazopanib After a lot of discussions, she is in agreement to resume chemotherapy with gemcitabine and Taxotere Based on previous toxicities and tolerance, I recommend we duplicate similar dose reduction and treatment schedule as before She will receive chemotherapy on days 1 and day 15 for cycle of every 28 days along with G-CSF support The dose of gemcitabine will be reduced by 20% to 720 mg/m.  Similarly, Taxotere dose will be also reduce 20% to 80 mg/m She will also receive G-CSF support We will get her started on treatment next week I recommend minimum 3 cycles of therapy before repeating CT imaging We will try to get help from her surgeon at [facility] to get molecular testing/next generation sequencing to look for targetable mutations in the future  Metastasis to bone [facility]) I recommend the patient to get dental clearance so that we can prescribe Zometa in the future  Orders Placed This Encounter  Procedures   CBC with Differential ([facility])    Standing Status:   Future    Standing Expiration Date:   11/25/2023   CMP ([facility])    Standing Status:   Future    Standing Expiration Date:   11/25/2023   CBC with Differential ([facility])    Standing Status:   Future    Standing Expiration Date:   12/08/2023   CMP ([facility])    Standing Status:   Future    Standing Expiration Date:   12/08/2023   CBC with Differential ([facility])    Standing Status:   Future    Standing Expiration Date:   12/22/2023   CMP ([facility])    Standing Status:   Future    Standing Expiration Date:    12/22/2023   CBC with Differential ([facility])    Standing Status:   Future    Standing Expiration Date:   01/05/2024   CMP ([facility])    Standing Status:   Future    Standing Expiration Date:   01/05/2024   CBC with Differential ([facility])    Standing Status:   Future    Standing Expiration Date:   01/19/2024   CMP ([facility])    Standing Status:   Future    Standing Expiration Date:   01/19/2024   CBC with Differential ([facility])    Standing Status:   Future    Standing Expiration Date:   02/02/2024   CMP ([facility])    Standing Status:   Future    Standing Expiration Date:   02/02/2024   CBC with Differential ([facility])    Standing Status:   Future    Standing Expiration Date:   02/16/2024   CMP ([facility])    Standing Status:   Future    Standing Expiration Date:   02/16/2024   CBC with Differential ([facility])    Standing Status:   Future    Standing Expiration Date:   03/01/2024   CMP ([place] [facility])    Standing Status:   Future    Standing Expiration Date:  03/01/2024   CBC with Differential ([facility])    Standing Status:   Future    Standing Expiration Date:   03/15/2024   CMP ([place] [facility])    Standing Status:   Future    Standing Expiration Date:   03/15/2024   CBC with Differential ([facility])    Standing Status:   Future    Standing Expiration Date:   03/29/2024   CMP ([facility])    Standing Status:   Future    Standing Expiration Date:   03/29/2024   CBC with Differential ([facility])    Standing Status:   Future    Standing Expiration Date:   04/12/2024   CMP ([place] [facility])    Standing Status:   Future    Standing Expiration Date:   04/12/2024   CBC with Differential ([facility])    Standing Status:   Future    Standing Expiration Date:   04/26/2024   CMP ([facility])    Standing Status:   Future    Standing Expiration  Date:   04/26/2024    All questions were answered. The patient knows to call the clinic with any problems, questions or concerns. The total time spent in the appointment was 40 minutes encounter with patients including review of chart and various tests results, discussions about plan of care and coordination of care plan   [name], MD 11/18/2022 2:58 PM  INTERVAL HISTORY: Please see below for problem oriented charting. she returns for treatment follow-up with her husband She is feeling better since completion of radiation therapy Her energy level is excellent She denies pain at the site of radiation at the back of her skull We reviewed documentation from [facility] and discussed risk and benefits of different treatment options  REVIEW OF SYSTEMS:   Constitutional: Denies fevers, chills or abnormal weight loss Eyes: Denies blurriness of vision Ears, nose, mouth, throat, and face: Denies mucositis or sore throat Respiratory: Denies cough, dyspnea or wheezes Cardiovascular: Denies palpitation, chest discomfort or lower extremity swelling Gastrointestinal:  Denies nausea, heartburn or change in bowel habits Skin: Denies abnormal skin rashes Lymphatics: Denies new lymphadenopathy or easy bruising Neurological:Denies numbness, tingling or new weaknesses Behavioral/Psych: Mood is stable, no new changes  All other systems were reviewed with the patient and are negative.  I have reviewed the past medical history, past surgical history, social history and family history with the patient and they are unchanged from previous note.  ALLERGIES:  is allergic to doxycycline.  MEDICATIONS:  Current Outpatient Medications  Medication Sig Dispense Refill   acetaminophen (TYLENOL) 500 MG tablet Take 1,000 mg by mouth every 6 (six) hours as needed for moderate pain or headache.     calcium carbonate (TUMS - DOSED IN MG ELEMENTAL CALCIUM) 500 MG chewable tablet Chew 1 tablet by mouth 2 (two) times daily.      cholecalciferol (VITAMIN D3) 25 MCG (1000 UNIT) tablet Take 2,000 Units by mouth daily.     dexamethasone (DECADRON) 4 MG tablet Take 0.5 tablets (2 mg total) by mouth daily.     estradiol (ESTRACE) 0.1 MG/GM vaginal cream PLACE FINGER TIP SIZE AMOUNT OF CREAM AND INSERT SLIGHTLY PAST THE VAGINAL ENTRANCE 3 TIMES DAILY 126 g 4   lidocaine (XYLOCAINE) 2 % solution SMARTSIG:By Mouth     lidocaine-prilocaine (EMLA) cream Apply to affected area once 30 g 3   loratadine (CLARITIN) 10 MG tablet Take 10 mg by  mouth daily as needed (for bone aches).     LORazepam (ATIVAN) 0.5 MG tablet Take 1 tablet (0.5 mg total) by mouth 2 (two) times daily as needed for anxiety. 30 tablet 0   magic mouthwash (nystatin, diphenhydrAMINE, alum & mag hydroxide) suspension mixture Swish and spit 5 mLs 4 (four) times daily as needed for mouth pain. 240 mL 0   metoprolol succinate (TOPROL XL) 25 MG 24 hr tablet Take 1 tablet (25 mg total) by mouth at bedtime. 30 tablet 6   ondansetron (ZOFRAN) 8 MG tablet Take 1 tablet (8 mg total) by mouth every 8 (eight) hours as needed. 30 tablet 1   prochlorperazine (COMPAZINE) 10 MG tablet Take 1 tablet (10 mg total) by mouth every 6 (six) hours as needed (Nausea or vomiting). 90 tablet 1   senna (SENOKOT) 8.6 MG TABS tablet Take 2 tablets by mouth at bedtime.     No current facility-administered medications for this visit.    SUMMARY OF ONCOLOGIC HISTORY: Oncology History  Uterine leiomyosarcoma ([place])  06/11/2021 Imaging   1. 9.5 x 7.6 x 9.0 cm complex, partially necrotic, mass involving the lower uterine segment/ cervix. No obvious direct extension into the parametrium.  2. 9 mm left pelvic sidewall lymph node is partially necrotic and worrisome for metastatic adenopathy.  3. No findings for abdominal omental or peritoneal surface disease or adenopathy.  4. Tiny low-attenuation lesion in the pancreatic head, likely benign cyst but attention on follow-up scans is suggested.  5.  2.9 cm fundal fibroid.    06/19/2021 Pathology Results   FINAL MICROSCOPIC DIAGNOSIS:   A. UTERINE, CERVICAL MASS, BIOPSY:  - Spindle cell malignancy.  - See comment.   COMMENT:  The biopsies consist of endocervical mucosa with stromal edema and one biopsy fragment has a microscopic focus with atypical spindle cells consistent with poorly differentiated malignancy.  The differential  includes a spindle cell malignancy such as sarcomatoid carcinoma and leiomyosarcoma.  Mullerian adenosarcoma is also a consideration but considered less likely   06/23/2021 Imaging   MR pelvis  10 cm uterine mass with central necrosis, which is centered in the cervix and lower uterine segment. Right parametrial involvement is seen as well as suspected invasion of the distal rectum. Differential diagnosis includes cervical carcinoma and uterine leiomyosarcoma.   Mild bilateral iliac lymphadenopathy, highly suspicious for metastatic disease.   2.9 cm subserosal fibroid in the posterior fundus.   Normal appearance of both ovaries.     06/29/2021 PET scan   1. Hypermetabolic necrotic cervical/uterine mass with bilateral external iliac hypermetabolic lymph nodes. No evidence of distant metastatic disease. 2. 1.5 cm low-attenuation left thyroid nodule. Recommend thyroid ultrasound. (Ref: J Am Coll Radiol. 2015 Feb;12(2): 143-50).   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, pleomorphic, and myxoid areas and associated necrosis (~20%) - Tumor based in cervix and also involves lower uterine segment - Cervicovaginal margin involved by focal invasive leiomyosarcoma (3:00-5:00, A10) as well as tumor in lymphovascular spaces - Leiomyosarcoma involves right and left parametrial tissue and extends to parametrial margins - Extensive  lymphovascular space invasion present, including in uterus and parametria - See synoptic report and comment   Other findings: - Leiomyomata with hyalinization, size up to 3.0 cm - Ovaries and fallopian tubes with no parenchymal involvement by leiomyosarcoma identified, although adnexal lymphovascular space invasion is present  B: Lymph nodes, right pelvic, lymphadenectomy - One of four lymph nodes positive for metastatic leiomyosarcoma (1/4), with extracapsular extension present   C: Lymph nodes, left pelvic, lymphadenectomy - One of four lymph nodes positive for metastatic leiomyosarcoma (1/4), with extracapsular extension present  Immunohistochemical stains are performed on block A11, and demonstrate that the tumor is positive for desmin and CD10, with SMA staining the majority of the spindle cell component but largely negative in the epithelioid / pleomorphic component. OSCAR, pancytokeratin AE1/AE3, HMB45, and PR appear negative in the tumor. ER shows patchy weak staining and myogenin stains rare cells. Block A23 also shows positive desmin and negative OSCAR pancytokeratin. Overall, the findings are most consistent with leiomyosarcoma, with extensive areas that are myxoid, epithelioid, and pleomorphic as well as more typical spindle cell areas within the overall high grade tumor (grade 3 / 3). The tumor is staged as pT2b (involves other pelvic tissues) given the parametrial involvement and pN1 for FIGO stage IIIC.    07/17/2021 [facility]   Date of [facility]: 07/17/21  Preoperative Diagnosis: High Grade Uterine Sarcoma  Postoperative Diagnosis: Same  Procedure(s): Bilateral - RADICAL ABDOMINAL HYSTER, W/BIL TOTAL PELVIC LYMPHADENECTOMY & PARA-AORTIC LYMPH NODE BX W/WO REM TUBE/OVAR VAGINAL HYSTERECTOMY, FOR UTERUS 250 G OR LESS; WITH REPAIR OF ENTEROCELE COLECTOMY, PARTIAL; WITH COLOPROCTOSTOMY (LOW PELVIC ANASTOMOSIS) WITH COLOSTOMY CYSTOURETHROSCOPY, WITH INSERTION OF INDWELLING URETERAL  STENT (EG, GIBBONS OR DOUBLE-J TYPE) - Cystourethroscopy - Bilateral ureteral stent placement - Foley catheter placement  Performing Service: Gynecology Oncology Surgeon(s) and Role: Panel 1: * Lafonda Mosses, MD - Primary * Bernadene Bell, MD - Resident - Assisting * Devonne Doughty, MD - Resident - Assisting Panel 2: * Franchot Erichsen, MD - Primary  Drains:  - Left 6Fr open-ended ureteral access catheter (green) - Right 5Fr open-ended ureteral access catheter (white) - 16Fr foley catheter to drainage  * No implants in log *  Indications: 57 y.o. female with high grade uterine sarcoma. Urology was consulted pre-operatively for placement of bilateral ureteral stents. Risks, benefits, and alternatives of the above procedure were discussed and informed consent was signed.  OperativeFindings:  - Grossly distorted architecture of urinary bladder likely 2/2 pelvic mass with anterolaterally positioned UOs - Successful placement of bilateral open-ended ureteral catheters under direct visualization - Foley catheter placed at case conclusion  Description: The patient was correctly identified in the preop holding area where written informed consent as well potential risk and complication reviewed. She agreed. The patient was brought to the operative suite where a preinduction timeout was performed. Once correct information was verified, general anesthesia was induced. The patient was then gently placed into dorsal lithotomy position with SCDs in place for VTE prophylaxis. They were prepped and draped in the usual sterile fashion and given appropriate preoperative antibiotics. A second timeout was then performed.   We inserted a 59F rigid cystoscope per urethra with copious lubrication and normal saline irrigation running. We performed cystourethroscopy, which revealed the above findings.  We turned our attention to the left ureteral orifice and canulated it with a sensor wire,  using assistance of a 6Fr open-ended catheter. The wire was advanced into the renal pelvis without difficulty under visual guidance. We then advanced the stent over our wire into the renal pelvis under direct visualization and feel without complication. The wire was subsequently removed.   We then turned our attention to the right ureteral orifice and canulated it with a sensor wire, using assistance of a 5Fr open-ended  catheter. The wire was advanced into the renal pelvis without difficulty under visual guidance. We then advanced the stent over our wire into the renal pelvis under direct visualization and feel without complication. The wire was subsequently removed.   A 16Fr straight catheter was placed, with return of urine indicating appropriate position within the bladder. The balloon was inflated with 10cc sterile water. The stents were secured to the Foley using 0-silk ties, being careful not to occlude the stents or Foley.   The patient was awoken from general anesthesia having tolerated the procedure well and taken to the PACU for routine post-operative recovery.  Post-Op Plan:  - Foley and stents per primary team    07/17/2021 [facility]   Date of Tammie: 07/17/2021  Pre-op Diagnosis: Uterine spindle cell malignancy  Post-op Diagnosis: Same  Procedure(s): Panel 1 RADICAL ABDOMINAL HYSTER, with bilateral S&O, vagineconty upper, bilateral pelvic lyphadenectomy, bilateral ureterolysis,: 68127 (CPT) Panel 2 CYSTOURETHROSCOPY, WITH INSERTION OF INDWELLING URETERAL STENT (EG, GIBBONS OR DOUBLE-J TYPE): 51700 (CPT) Note: Revisions to procedures should be made in chart - see Procedures activity.  Performing Service: Gynecology Oncology Surgeon(s) and Role: Panel 1: * Lafonda Mosses, MD - Primary * Bernadene Bell, MD - Resident - Assisting * Devonne Doughty, MD - Resident - Assisting Panel 2: * Franchot Erichsen, MD - Primary  Findings: On bimanual exam, 10cm necrotic mass  filling upper vagina, unable to discretely palpate the cervix. On rectovaginal exam, rectal involvement not identified. Intraoperatively, normal upper abdominal survey including normal liver, diaphragm, stomach, omentum and bowel. Small uterus with 10cm mass expanding the cervix. Palpably enlarged bilateral pelvic lymph nodes adherent to the external iliac veins and obturator nerves, removed. No palpable para-aortic lymphadenopathy. No rectal involvement of uterine mass.   Specimens:  ID Type Source Tests Collected by Time Destination  1 : uterus,cervix,bilateral tubes/ovaries Tissue Uterus SURGICAL PATHOLOGY EXAM Lafonda Mosses, MD 07/17/2021 0932  2 : right pelvic lymph node Tissue Lymph Node SURGICAL PATHOLOGY EXAM Lafonda Mosses, MD 07/17/2021 1125  3 : LEFT PELVIC LN Tissue Lymph Node SURGICAL PATHOLOGY EXAM Lafonda Mosses, MD 07/17/2021 1144    08/06/2021 Initial Diagnosis   Uterine leiomyosarcoma ([place])   08/06/2021 [facility] Staging   Staging form: Corpus Uteri - Leiomyosarcoma and Endometrial Stromal Sarcoma, AJCC 8th Edition - Pathologic stage from 08/06/2021: FIGO Stage IVB (pT3, pN1, cM1) - Signed by [name], MD on 08/11/2021 Stage prefix: Initial diagnosis   08/10/2021 Imaging   CT abdomen and pelvis 1. Interval development of left lobe pulmonary nodules, measuring up to 7 mm and highly for metastatic disease. 2. Interval development of small to upper normal lymph nodes in the pelvis, concerning for metastatic disease. 3. Postoperative seroma left pelvic sidewall. 4. Tiny cluster of tree-in-bud opacity in the peripheral right lower lobe is new and compatible with sequelae of atypical infection.   08/13/2021 Procedure   Procedure: Placement of a right IJ approach single lumen PowerPort.  Tip is positioned at the superior cavoatrial junction and catheter is ready for immediate use.  Complications: No immediate   08/14/2021 Echocardiogram    1. Left ventricular ejection  fraction, by estimation, is 60 to 65%. The left ventricle has normal function. The left ventricle has no regional wall motion abnormalities. Left ventricular diastolic parameters were normal. The average left ventricular global longitudinal strain is -17.4 %. The global longitudinal strain is normal.  2. Right ventricular systolic function is normal. The right ventricular size is normal.  3. The mitral valve is normal in structure. No evidence of mitral valve regurgitation. No evidence of mitral stenosis.  4. The aortic valve is tricuspid. Aortic valve regurgitation is not visualized. No aortic stenosis is present.  5. The inferior vena cava is normal in size with greater than 50% respiratory variability, suggesting right atrial pressure of 3 mmHg.     08/17/2021 Imaging   Multiple new and enlarging pulmonary nodules scattered throughout the lungs bilaterally, highly concerning for progressive metastatic disease to the lungs   08/18/2021 - 11/10/2021 Chemotherapy   Patient is on Treatment Plan : UTERINE LEIOMYOSARCOMA Doxorubicin q21d x 6 Cycles     08/18/2021 - 08/12/2022 Chemotherapy   Patient is on Treatment Plan : UTERINE UNDIFFERENTIATED LEIOMYOSARCOMA Gemcitabine D1,8 + Docetaxel D8 (900/100) q21d     11/09/2021 Imaging   IMPRESSION: 1. Multiple small bilateral pulmonary nodules, some of which are slightly increased in size. Other nodules unchanged. 2. Interval decrease in size of left pelvic sidewall lymph nodes. 3. Unchanged size of perirectal lymph nodes or soft tissue nodules. These however demonstrate new internal hypodensity, suggesting treatment response and internal necrosis. 4. Unchanged left iliac lymph node or peritoneal nodule. 5. Findings are consistent with mixed response to treatment. No evidence of new metastatic disease in the chest, abdomen, or pelvis. 6. Wall thickening and mucosal hyperenhancement of the bladder, consistent with nonspecific infectious or inflammatory  cystitis. Correlate with urinalysis. 7. Status post hysterectomy and oophorectomy. Interval resolution of a previously noted left pelvic hematoma or seroma. 8. Trace, nonspecific free fluid in the low pelvis.   11/30/2021 Echocardiogram    1. Left ventricular ejection fraction, by estimation, is 40 to 45%. Left ventricular ejection fraction by 3D volume is 41 %. The left ventricle has mildly decreased function. The left ventricle has no regional wall motion abnormalities. Left ventricular  diastolic parameters are consistent with Grade I diastolic dysfunction (impaired relaxation).  2. Right ventricular systolic function is moderately reduced. The right ventricular size is normal.  3. The mitral valve is grossly normal. No evidence of mitral valve regurgitation.  4. The aortic valve is normal in structure. Aortic valve regurgitation is not visualized. No aortic stenosis is present.     12/07/2021 - 05/31/2022 Chemotherapy   Patient is on Treatment Plan : UTERINE UNDIFFERENTIATED / LEIOMYOSARCOMA Gemcitabine D1,8 + Docetaxel D8 (900/100) q21d     02/05/2022 Imaging   Pathologic burst fracture of L3 due to a metastatic lesion, with probable mild degree of right-sided extraosseous tumor extension in the paraspinal soft tissues, and mild involvement of the right pedicle. No epidural/spinal canal involvement.   Multilevel degenerative disc disease without any significant stenosis in the lumbar spine.   05/31/2022 Imaging   1. Signs of pelvic resolution of pelvic sidewall nodal disease/soft tissue near the LEFT vaginal apex. 2. Decreased conspicuity of RIGHT lower lobe pulmonary nodule and stable LEFT apical pulmonary nodule. 3. Unchanged appearance of pathologic fracture at L3. 4. Urinary bladder wall thickening, slightly improved posteriorly, anterior urinary bladder may show some residual diffuse thickening. Continued correlation with signs of cystitis is suggested.     08/24/2022 Imaging   1.  Interval growth of a solitary right external iliac nodal metastasis. No additional sites of new or progressive metastatic disease. 2. Small bilateral upper lobe pulmonary nodules are stable to mildly decreased. 3. Chronic incompletely healed pathologic L3 vertebral fracture with underlying lytic metastasis, not appreciably changed. No new focal osseous lesions. 4. New trace dependent bilateral pleural effusions.  5.  Aortic Atherosclerosis (ICD10-I70.0).     08/27/2022 Procedure   1. Successful L3 Osteocool RFA ablation. 2. Successful L3 kyphoplasty.   10/14/2022 Imaging   Focal soft tissue swelling along the occipital region seen on lateral view may correspond to palpable area of clinical concern. Lytic calvarial lesion underlying this area measures 3.2 cm in craniocaudal dimension, suspicious for osseous metastasis in the setting of known malignancy. Recommend contrast-enhanced MRI or CT for further evaluation.   10/21/2022 Imaging   MRI brain 1. 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 2. 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   10/26/2022 Imaging   1. Multiple new and enlarged bilateral pulmonary nodules, consistent with worsened pulmonary metastatic disease. 2. Interval enlargement of a necrotic appearing right external iliac lymph node, consistent with worsened nodal metastatic disease. 3. Multiple new small peritoneal nodules, consistent with peritoneal metastatic disease. 4. Trace ascites in the low pelvis, presumed malignant. 5. Interval vertebral cement augmentation of a pathologic fracture of L3. 6. Status post hysterectomy.   10/27/2022 Imaging   Bone scan  1. 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 2. No additional foci of abnormal increased uptake suggestive of metastatic disease.   11/05/2022  Echocardiogram   1. Left ventricular ejection fraction, by estimation, is 60 to 65%. The left ventricle has normal function. The left ventricle has no regional wall motion abnormalities. Left ventricular diastolic parameters were normal. The average left ventricular global longitudinal strain is -15.0 %. The global longitudinal strain is abnormal.  2. Right ventricular systolic function is normal. The right ventricular size is normal. Tricuspid regurgitation signal is inadequate for assessing PA pressure.  3. No evidence of mitral valve regurgitation.  4. The aortic valve is grossly normal. Aortic valve regurgitation is not visualized.  5. The inferior vena cava is normal in size with greater than 50% respiratory variability, suggesting right atrial pressure of 3 mmHg.   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 Multiple small bilateral pulmonary nodules, some of which are slightly increased in size. Other nodules unchanged. 2. Interval decrease in size of left pelvic sidewall lymph nodes. 3. Unchanged size of perirectal lymph nodes or soft tissue nodules. These however demonstrate new internal hypodensity, suggesting treatment response and internal necrosis. 4. Unchanged left iliac lymph node or peritoneal nodule. 5. Findings are consistent with mixed response to treatment. No evidence of new metastatic disease in the chest, abdomen, or pelvis. 6. Wall thickening and  mucosal hyperenhancement of the bladder, consistent with nonspecific infectious or inflammatory cystitis. Correlate with urinalysis. 7. Status post hysterectomy and oophorectomy. Interval resolution of a previously noted left pelvic hematoma or seroma. 8. Trace, nonspecific free fluid in the low pelvis.   12/07/2021 - 05/31/2022 Chemotherapy   Patient is on Treatment Plan : UTERINE UNDIFFERENTIATED / LEIOMYOSARCOMA Gemcitabine D1,8 +  Docetaxel D8 (900/100) q21d     11/24/2022 -  Chemotherapy   Patient is on Treatment Plan : UTERINE UNDIFFERENTIATED LEIOMYOSARCOMA Gemcitabine D1,8 + Docetaxel D8 (900/100) q21d       PHYSICAL EXAMINATION: ECOG PERFORMANCE STATUS: 1 - Symptomatic but completely ambulatory  Vitals:   11/18/22 1309  BP: (!) 150/83  Pulse: 91  Resp: 18  Temp: 99 F (37.2 C)  SpO2: 100%   Filed Weights   11/18/22 1309  Weight: 129 lb 9.6 oz (58.8 kg)    GENERAL:alert, no distress and comfortable   NEURO: alert & oriented x 3 with fluent speech, no focal motor/sensory deficits  LABORATORY DATA:  I have reviewed the data as listed    Component Value Date/Time   NA 141 10/28/2022 1337   K 4.0 10/28/2022 1337   CL 106 10/28/2022 1337   CO2 29 10/28/2022 1337   GLUCOSE 88 10/28/2022 1337   BUN 14 10/28/2022 1337   CREATININE 0.77 10/28/2022 1337   CALCIUM 9.1 10/28/2022 1337   PROT 5.8 (L) 10/28/2022 1337   ALBUMIN 3.6 10/28/2022 1337   AST 14 (L) 10/28/2022 1337   ALT 13 10/28/2022 1337   ALKPHOS 64 10/28/2022 1337   BILITOT 0.3 10/28/2022 1337   GFRNONAA >60 10/28/2022 1337    No results found for: "SPEP", "UPEP"  Lab Results  Component Value Date   WBC 3.6 (L) 10/28/2022   NEUTROABS 2.5 10/28/2022   HGB 12.6 10/28/2022   HCT 37.2 10/28/2022   MCV 91.0 10/28/2022   PLT 202 10/28/2022      Chemistry      Component Value Date/Time   NA 141 10/28/2022 1337   K 4.0 10/28/2022 1337   CL 106 10/28/2022 1337   CO2 29 10/28/2022 1337   BUN 14  10/28/2022 1337   CREATININE 0.77 10/28/2022 1337      Component Value Date/Time   CALCIUM 9.1 10/28/2022 1337   ALKPHOS 64 10/28/2022 1337   AST 14 (L) 10/28/2022 1337   ALT 13 10/28/2022 1337   BILITOT 0.3 10/28/2022 1337       RADIOGRAPHIC STUDIES: I have personally reviewed the radiological images as listed and agreed with the findings in the report. ECHOCARDIOGRAM COMPLETE  Result Date: 11/05/2022    ECHOCARDIOGRAM REPORT   Patient Name:   Tammie Gilmore Date of Exam: 11/05/2022 Medical Rec #:  [id number]         Height:       68.0 in Accession #:    [id number]        Weight:       129.4 lb Date of Birth:  01-08-1966        BSA:          1.698 m Patient Age:    19 years          BP:           160/80 mmHg Patient Gender: F                 HR:           83 bpm. Exam Location:  Outpatient Procedure: 2D Echo, Color Doppler, Cardiac Doppler and Strain Analysis Indications:    Chemo Z09  History:        Patient has prior history of Echocardiogram examinations, most                 recent 01/19/2022.  Sonographer:    Bernadene Person RDCS Referring Phys:  [id number] Freddi Forster  Sonographer Comments: Global longitudinal strain was attempted. IMPRESSIONS  1. Left ventricular ejection fraction, by estimation, is 60 to 65%. The left ventricle has normal function. The left ventricle has no regional wall motion abnormalities. Left ventricular diastolic parameters were normal. The average left ventricular global longitudinal strain is -15.0 %. The global longitudinal strain is abnormal.  2. Right ventricular systolic function is normal. The right ventricular size is normal. Tricuspid regurgitation signal is inadequate for assessing PA pressure.  3. No evidence of mitral valve regurgitation.  4. The aortic valve is grossly normal. Aortic valve regurgitation is not visualized.  5. The inferior vena cava is normal in size with greater than 50% respiratory variability, suggesting right atrial pressure of 3 mmHg.  Conclusion(s)/Recommendation(s): GLS stable; mildy abnormal. Safe to continue therapy; can consider better blood pressure control. FINDINGS  Left Ventricle: Left ventricular ejection fraction, by estimation, is 60 to 65%. The left ventricle has normal function. The left ventricle has no regional wall motion abnormalities. The average left ventricular global longitudinal strain is -15.0 %. The global longitudinal strain is abnormal. The left ventricular internal cavity size was normal in size. There is no left ventricular hypertrophy. Left ventricular diastolic parameters were normal. Right Ventricle: The right ventricular size is normal. Right ventricular systolic function is normal. Tricuspid regurgitation signal is inadequate for assessing PA pressure. Left Atrium: Left atrial size was normal in size. Right Atrium: Right atrial size was normal in size. Pericardium: There is no evidence of pericardial effusion. Mitral Valve: No evidence of mitral valve regurgitation. Tricuspid Valve: Tricuspid valve regurgitation is not demonstrated. Aortic Valve: The aortic valve is grossly normal. Aortic valve regurgitation is not visualized. Aortic regurgitation PHT measures 424 msec. Pulmonic Valve: Pulmonic valve regurgitation is not visualized. Aorta: The aortic root and ascending aorta are structurally normal, with no evidence of dilitation. Venous: The inferior vena cava is normal in size with greater than 50% respiratory variability, suggesting right atrial pressure of 3 mmHg. IAS/Shunts: No atrial level shunt detected by color flow Doppler.  LEFT VENTRICLE PLAX 2D LVIDd:         4.00 cm     Diastology LVIDs:         2.90 cm     LV e' medial:    7.22 cm/s LV PW:         0.80 cm     LV E/e' medial:  8.3 LV IVS:        0.80 cm     LV e' lateral:   8.43 cm/s LVOT diam:     2.00 cm     LV E/e' lateral: 7.1 LV SV:         41 LV SV Index:   24          2D Longitudinal Strain LVOT Area:     3.14 cm    2D Strain GLS Avg:      -15.0 %  LV Volumes (MOD) LV vol d, MOD A2C: 66.1 ml LV vol d, MOD A4C: 63.4 ml LV vol s, MOD A2C: 31.1 ml LV vol s, MOD A4C: 30.3 ml LV SV MOD A2C:     35.0 ml LV SV MOD A4C:     63.4 ml LV SV MOD BP:      33.9 ml RIGHT VENTRICLE RV S prime:     7.87 cm/s TAPSE (M-mode): 1.4 cm LEFT ATRIUM             Index  RIGHT ATRIUM          Index LA diam:        2.40 cm 1.41 cm/m   RA Area:     8.49 cm LA Vol (A2C):   25.8 ml 15.19 ml/m  RA Volume:   14.50 ml 8.54 ml/m LA Vol (A4C):   21.4 ml 12.60 ml/m LA Biplane Vol: 23.7 ml 13.95 ml/m  AORTIC VALVE LVOT Vmax:   83.60 cm/s LVOT Vmean:  52.600 cm/s LVOT VTI:    0.129 m AI PHT:      424 msec  AORTA Ao Root diam: 3.10 cm Ao Asc diam:  3.20 cm MITRAL VALVE MV Area (PHT): 4.63 cm    SHUNTS MV Decel Time: 164 msec    Systemic VTI:  0.13 m MV E velocity: 60.00 cm/s  Systemic Diam: 2.00 cm MV A velocity: 46.70 cm/s MV E/A ratio:  1.28 Placido Sou signed by Phineas Inches Signature Date/Time: 11/05/2022/1:27:29 PM    Final    MR Brain W Wo Contrast  Result Date: 11/04/2022 CLINICAL DATA:  Metastatic disease evaluation. History of leiomyosarcoma. EXAM: MRI HEAD WITHOUT AND WITH CONTRAST TECHNIQUE: Multiplanar, multiecho pulse sequences of the brain and surrounding structures were obtained without and with intravenous contrast. CONTRAST:  6 mL Vueway COMPARISON:  Head MRI 10/20/2022 FINDINGS: Brain: An 11 mm enhancing lesion in the left frontoparietal operculum has slightly enlarged (series 13, image 101, previously 9 mm) with increased, mild edema. An associated punctate focus of chronic hemorrhage or mineralization is again noted. Three punctate enhancing cortical lesions in the right frontal lobe measuring approximately 1.5 mm each were not apparent on the prior 1.5 T MRI (series 13, images 120, 123, and 137). There is no edema associated with these lesions. No acute infarct, midline shift, or extra-axial fluid collection is evident. The ventricles are  normal in size. Scattered small T2 hyperintensities in the cerebral white matter bilaterally are unchanged and nonspecific but compatible with minimal chronic small vessel ischemic disease. 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 Vascular: Major intracranial vascular flow voids are preserved. Skull and upper cervical spine: Unchanged 3 cm destructive midline occipital skull lesion with extension into the overlying scalp soft tissues and with unchanged associated mild dural thickening over both occipital poles. Sinuses/Orbits: Unremarkable orbits. Mild mucosal thickening in the right maxillary sinus. Clear mastoid air cells. Other: None. IMPRESSION: 1. Slightly increased size of a left frontoparietal metastasis with increased, mild edema. 2. Three punctate enhancing lesions in the right frontal lobe which were not visible on the prior MRI and also likely reflect metastases. 3. Unchanged occipital skull metastasis. Electronically Signed   By: Logan Bores M.D.   On: 11/04/2022 08:04   NM Bone Scan Whole Body  Result Date: 10/28/2022 CLINICAL DATA:  History of cervical Tammie, leiomyosarcoma EXAM: NUCLEAR MEDICINE WHOLE BODY BONE SCAN TECHNIQUE: Whole body anterior and posterior images were obtained approximately 3 hours after intravenous injection of radiopharmaceutical. RADIOPHARMACEUTICALS:  20.3 mCi Technetium-29mMDP IV COMPARISON:  Brain MRI 10/20/2022 and CT chest abdomen pelvis 10/22/2022. 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 This no additional foci of abnormal increased radiotracer uptake identified. Physiologic tracer activity noted within the kidneys an urinary bladder. IMPRESSION: 1. 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 2. No  additional foci of abnormal increased uptake suggestive of metastatic disease. Electronically Signed   By: Kerby Moors M.D.   On: 10/28/2022 08:16   CT CHEST ABDOMEN PELVIS W CONTRAST  Result Date: 10/23/2022 CLINICAL DATA:  Cervical Tammie, leiomyosarcoma, status post chemotherapy, assess treatment response * Tracking Code: BO * EXAM: CT CHEST, ABDOMEN, AND PELVIS WITH CONTRAST TECHNIQUE: Multidetector CT imaging of the chest, abdomen and pelvis was performed following the standard protocol during bolus administration of intravenous contrast. RADIATION DOSE REDUCTION: This exam was performed according to the departmental dose-optimization program which includes automated exposure control, adjustment of the mA and/or kV according to patient size and/or use of iterative reconstruction technique. CONTRAST:  185m OMNIPAQUE IOHEXOL 300 MG/ML  SOLN COMPARISON:  08/20/2022 FINDINGS: CT CHEST FINDINGS Cardiovascular: Right chest port catheter. Normal heart size. No pericardial effusion. Mediastinum/Nodes: No enlarged mediastinal, hilar, or axillary lymph nodes. Thyroid gland, trachea, and esophagus demonstrate no significant findings. Lungs/Pleura: Interval enlargement of a nodule of the anterior right upper lobe measuring 1.0 x 0.8 cm, previously no greater than 0.3 cm (series 7, image 46). Interval enlargement of a nodule of the medial right upper lobe measuring 1.5 x 1.0 cm, previously no greater than 0.5 cm (series 7, image 41). Multiple new nodules, for example a 0.8 x 0.6 cm nodule of the medial left lower lobe (series 7, image 111). No pleural effusion or pneumothorax. Musculoskeletal: No chest wall abnormality. No acute osseous findings. CT ABDOMEN PELVIS FINDINGS Hepatobiliary: No solid liver abnormality is seen. No gallstones, gallbladder wall thickening, or biliary dilatation. Pancreas: Unremarkable. No pancreatic ductal dilatation or surrounding inflammatory changes. Spleen: Normal in size without  significant abnormality. Adrenals/Urinary Tract: Adrenal glands are unremarkable. Kidneys are normal, without renal calculi, solid lesion, or hydronephrosis. Bladder is unremarkable. Stomach/Bowel: Stomach is within normal limits. Appendix appears normal. No evidence of bowel wall thickening, distention, or inflammatory changes. Vascular/Lymphatic: No significant vascular findings are present. Interval enlargement of a necrotic appearing right external iliac lymph node measuring 2.0 x 1.6 cm, previously 1.5 x 1.4 cm (series 2, image 99). Reproductive: Status post hysterectomy. Other: No abdominal wall hernia or abnormality. Trace ascites in the low pelvis (series 2, image 109). Probable prior omentectomy. Multiple new small peritoneal nodules, for example in the midline ventral abdomen measuring 1.7 x 1.2 cm (series 2, image 85) and in the ventral right lower quadrant measuring 1.0 x 0.9 cm (series 2, image 89). Musculoskeletal: No acute osseous findings. Interval vertebral cement augmentation of a pathologic fracture of L3 (series 6, image 104). IMPRESSION: 1. Multiple new and enlarged bilateral pulmonary nodules, consistent with worsened pulmonary metastatic disease. 2. Interval enlargement of a necrotic appearing right external iliac lymph node, consistent with worsened nodal metastatic disease. 3. Multiple new small peritoneal nodules, consistent with peritoneal metastatic disease. 4. Trace ascites in the low pelvis, presumed malignant. 5. Interval vertebral cement augmentation of a pathologic fracture of L3. 6. Status post hysterectomy. Electronically Signed   By: ADelanna AhmadiM.D.   On: 10/23/2022 15:55   MR Brain W Wo Contrast  Result Date: 10/21/2022 CLINICAL DATA:  Metastatic disease evaluation. History of leiomyosarcoma. Painful posterior skull lump. EXAM: MRI HEAD WITHOUT AND WITH CONTRAST TECHNIQUE: Multiplanar, multiecho pulse sequences of the brain and surrounding structures were obtained without  and with intravenous contrast. CONTRAST:  5.547mGADAVIST GADOBUTROL 1 MMOL/ML IV SOLN COMPARISON:  Skull radiographs 10/13/2022.  Head CT 12/24/2021. FINDINGS: Brain: There is no evidence of an acute  infarct, midline shift, or extra-axial fluid collection. The ventricles and sulci are normal. Scattered small T2 hyperintensities in the cerebral white matter bilaterally are nonspecific but compatible with minimal chronic small vessel ischemic disease. 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 An enhancing lesion in the left frontoparietal operculum measures 9 mm and demonstrates a small amount of susceptibility centrally which may reflect blood products or mineralization (series 16, image 91). There is mild surrounding edema without mass effect. Vascular: Major intracranial vascular flow voids are preserved. Skull and upper cervical spine: 3 cm destructive midline occipital skull lesion with associated mild dural thickening over both occipital poles and with tumor extension into the overlying scalp soft tissues. No involvement of the adjacent superior sagittal sinus. Sinuses/Orbits: Unremarkable orbits. Mild mucosal thickening in the paranasal sinuses. Clear mastoid air cells. Other: None. IMPRESSION: 1. 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 2. 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 Electronically Signed   By: Logan Bores M.D.   On: 10/21/2022 09:50

## 2022-11-18 NOTE — Assessment & Plan Note (Signed)
I recommend the patient to get dental clearance so that we can prescribe Zometa in the future

## 2022-11-18 NOTE — Assessment & Plan Note (Signed)
I have reviewed documentation from recent consult at [facility] We discussed the risk, benefits, side effects of resumption of gemcitabine with Taxotere versus switching over to trabectedin or pazopanib After a lot of discussions, she is in agreement to resume chemotherapy with gemcitabine and Taxotere Based on previous toxicities and tolerance, I recommend we duplicate similar dose reduction and treatment schedule as before She will receive chemotherapy on days 1 and day 15 for cycle of every 28 days along with G-CSF support The dose of gemcitabine will be reduced by 20% to 720 mg/m.  Similarly, Taxotere dose will be also reduce 20% to 80 mg/m She will also receive G-CSF support We will get her started on treatment next week I recommend minimum 3 cycles of therapy before repeating CT imaging We will try to get help from her surgeon at [facility] to get molecular testing/next generation sequencing to look for targetable mutations in the future

## 2022-11-19 ENCOUNTER — Telehealth: Payer: Self-pay | Admitting: Oncology

## 2022-11-19 ENCOUNTER — Other Ambulatory Visit: Payer: Self-pay | Admitting: Radiation Therapy

## 2022-11-19 DIAGNOSIS — C7931 Secondary malignant neoplasm of brain: Secondary | ICD-10-CM

## 2022-11-19 NOTE — Telephone Encounter (Signed)
[facility] regarding Caris testing and spoke to [place].  Asked if block for accession [phone] has been sent for Caris testing.  Advised that it needs to be sent as soon as possible.  Holley Raring is going to check on the status and call us back.

## 2022-11-20 ENCOUNTER — Other Ambulatory Visit: Payer: Self-pay

## 2022-11-22 ENCOUNTER — Telehealth: Payer: Self-pay | Admitting: Hematology and Oncology

## 2022-11-22 ENCOUNTER — Other Ambulatory Visit: Payer: Self-pay

## 2022-11-22 NOTE — Telephone Encounter (Signed)
Scheduled appointment per WQ. Patient is aware of the made appointments. 

## 2022-11-23 ENCOUNTER — Other Ambulatory Visit (HOSPITAL_COMMUNITY): Payer: BC Managed Care – PPO

## 2022-11-23 ENCOUNTER — Other Ambulatory Visit: Payer: BC Managed Care – PPO

## 2022-11-24 ENCOUNTER — Inpatient Hospital Stay: Payer: BC Managed Care – PPO

## 2022-11-24 ENCOUNTER — Encounter: Payer: Self-pay | Admitting: Hematology and Oncology

## 2022-11-24 ENCOUNTER — Other Ambulatory Visit: Payer: Self-pay

## 2022-11-24 VITALS — BP 134/86 | HR 94 | Temp 98.4°F | Resp 16 | Wt 128.0 lb

## 2022-11-24 DIAGNOSIS — C55 Malignant neoplasm of uterus, part unspecified: Secondary | ICD-10-CM

## 2022-11-24 DIAGNOSIS — C7800 Secondary malignant neoplasm of unspecified lung: Secondary | ICD-10-CM

## 2022-11-24 LAB — CBC WITH DIFFERENTIAL (CANCER CENTER ONLY)
Abs Immature Granulocytes: 0.01 10*3/uL (ref 0.00–0.07)
Basophils Absolute: 0 10*3/uL (ref 0.0–0.1)
Basophils Relative: 1 %
Eosinophils Absolute: 0.2 10*3/uL (ref 0.0–0.5)
Eosinophils Relative: 5 %
HCT: 38.9 % (ref 36.0–46.0)
Hemoglobin: 13.3 g/dL (ref 12.0–15.0)
Immature Granulocytes: 0 %
Lymphocytes Relative: 16 %
Lymphs Abs: 0.6 10*3/uL — ABNORMAL LOW (ref 0.7–4.0)
MCH: 30.8 pg (ref 26.0–34.0)
MCHC: 34.2 g/dL (ref 30.0–36.0)
MCV: 90 fL (ref 80.0–100.0)
Monocytes Absolute: 0.4 10*3/uL (ref 0.1–1.0)
Monocytes Relative: 10 %
Neutro Abs: 2.4 10*3/uL (ref 1.7–7.7)
Neutrophils Relative %: 68 %
Platelet Count: 227 10*3/uL (ref 150–400)
RBC: 4.32 MIL/uL (ref 3.87–5.11)
RDW: 13.2 % (ref 11.5–15.5)
WBC Count: 3.5 10*3/uL — ABNORMAL LOW (ref 4.0–10.5)
nRBC: 0 % (ref 0.0–0.2)

## 2022-11-24 LAB — CMP (CANCER CENTER ONLY)
ALT: 12 U/L (ref 0–44)
AST: 13 U/L — ABNORMAL LOW (ref 15–41)
Albumin: 4 g/dL (ref 3.5–5.0)
Alkaline Phosphatase: 66 U/L (ref 38–126)
Anion gap: 6 (ref 5–15)
BUN: 16 mg/dL (ref 6–20)
CO2: 30 mmol/L (ref 22–32)
Calcium: 9.4 mg/dL (ref 8.9–10.3)
Chloride: 107 mmol/L (ref 98–111)
Creatinine: 0.6 mg/dL (ref 0.44–1.00)
GFR, Estimated: >60 mL/min (ref >60)
Glucose, Bld: 104 mg/dL — ABNORMAL HIGH (ref 70–99)
Potassium: 3.8 mmol/L (ref 3.5–5.1)
Sodium: 143 mmol/L (ref 135–145)
Total Bilirubin: 0.4 mg/dL (ref 0.3–1.2)
Total Protein: 6.4 g/dL — ABNORMAL LOW (ref 6.5–8.1)

## 2022-11-24 MED ORDER — SODIUM CHLORIDE 0.9% FLUSH
10.0000 mL | INTRAVENOUS | Status: DC | PRN
  Administered 2022-11-24: 10 mL

## 2022-11-24 MED ORDER — SODIUM CHLORIDE 0.9 % IV SOLN
720.0000 mg/m2 | Freq: Once | INTRAVENOUS | Status: AC
  Administered 2022-11-24: 1179 mg via INTRAVENOUS
  Filled 2022-11-24: qty 30.74

## 2022-11-24 MED ORDER — SODIUM CHLORIDE 0.9% FLUSH
10.0000 mL | Freq: Once | INTRAVENOUS | Status: AC | PRN
  Administered 2022-11-24: 10 mL

## 2022-11-24 MED ORDER — HEPARIN SOD (PORK) LOCK FLUSH 100 UNIT/ML IV SOLN
500.0000 [IU] | Freq: Once | INTRAVENOUS | Status: AC | PRN
  Administered 2022-11-24: 500 [IU]

## 2022-11-24 MED ORDER — PROCHLORPERAZINE MALEATE 10 MG PO TABS
10.0000 mg | ORAL_TABLET | Freq: Once | ORAL | Status: AC
  Administered 2022-11-24: 10 mg via ORAL
  Filled 2022-11-24: qty 1

## 2022-11-24 MED ORDER — SODIUM CHLORIDE 0.9 % IV SOLN: Freq: Once | INTRAVENOUS | Status: AC

## 2022-11-24 NOTE — Patient Instructions (Signed)
Tammie Gilmore  Discharge Instructions: Thank you for choosing [facility] to provide your oncology and hematology care.   If you have a lab appointment with the [facility], please go directly to the [facility] and check in at the registration area.   Wear comfortable clothing and clothing appropriate for easy access to any Portacath or PICC line.   We strive to give you quality time with your provider. You may need to reschedule your appointment if you arrive late (15 or more minutes).  Arriving late affects you and other patients whose appointments are after yours.  Also, if you miss three or more appointments without notifying the office, you may be dismissed from the clinic at the provider's discretion.      For prescription refill requests, have your pharmacy contact our office and allow 72 hours for refills to be completed.    Today you received the following chemotherapy and/or immunotherapy agents gemzar      To help prevent nausea and vomiting after your treatment, we encourage you to take your nausea medication as directed.  BELOW ARE SYMPTOMS THAT SHOULD BE REPORTED IMMEDIATELY: *FEVER GREATER THAN 100.4 F (38 C) OR HIGHER *CHILLS OR SWEATING *NAUSEA AND VOMITING THAT IS NOT CONTROLLED WITH YOUR NAUSEA MEDICATION *UNUSUAL SHORTNESS OF BREATH *UNUSUAL BRUISING OR BLEEDING *URINARY PROBLEMS (pain or burning when urinating, or frequent urination) *BOWEL PROBLEMS (unusual diarrhea, constipation, pain near the anus) TENDERNESS IN MOUTH AND THROAT WITH OR WITHOUT PRESENCE OF ULCERS (sore throat, sores in mouth, or a toothache) UNUSUAL RASH, SWELLING OR PAIN  UNUSUAL VAGINAL DISCHARGE OR ITCHING   Items with * indicate a potential emergency and should be followed up as soon as possible or go to the Emergency Department if any problems should occur.  Please show the CHEMOTHERAPY ALERT CARD or IMMUNOTHERAPY ALERT CARD at check-in  to the Emergency Department and triage nurse.  Should you have questions after your visit or need to cancel or reschedule your appointment, please contact [facility]  Dept: [phone]  and follow the prompts.  Office hours are 8:00 a.m. to 4:30 p.m. Monday - Friday. Please note that voicemails left after 4:00 p.m. may not be returned until the following business day.  We are closed weekends and major holidays. You have access to a nurse at all times for urgent questions. Please call the main number to the clinic Dept: [phone] and follow the prompts.   For any non-urgent questions, you may also contact your provider using MyChart. We now offer e-Visits for anyone 61 and older to request care online for non-urgent symptoms. For details visit [email or web address].   Also download the MyChart app! Go to the app store, search "MyChart", open the app, select [facility], and log in with your MyChart username and password.

## 2022-11-24 NOTE — Patient Instructions (Signed)

## 2022-11-25 ENCOUNTER — Ambulatory Visit: Payer: BC Managed Care – PPO | Admitting: Hematology and Oncology

## 2022-11-25 ENCOUNTER — Other Ambulatory Visit: Payer: Self-pay

## 2022-12-03 ENCOUNTER — Encounter: Payer: Self-pay | Admitting: Hematology and Oncology

## 2022-12-06 MED FILL — Dexamethasone Sodium Phosphate Inj 100 MG/10ML: INTRAMUSCULAR | Qty: 1 | Status: AC

## 2022-12-07 ENCOUNTER — Inpatient Hospital Stay: Payer: BC Managed Care – PPO

## 2022-12-07 ENCOUNTER — Encounter: Payer: Self-pay | Admitting: Hematology and Oncology

## 2022-12-07 ENCOUNTER — Inpatient Hospital Stay (HOSPITAL_BASED_OUTPATIENT_CLINIC_OR_DEPARTMENT_OTHER): Payer: BC Managed Care – PPO | Admitting: Hematology and Oncology

## 2022-12-07 ENCOUNTER — Other Ambulatory Visit: Payer: Self-pay

## 2022-12-07 VITALS — BP 137/80 | HR 78 | Temp 98.4°F | Resp 18 | Wt 127.0 lb

## 2022-12-07 DIAGNOSIS — C55 Malignant neoplasm of uterus, part unspecified: Secondary | ICD-10-CM

## 2022-12-07 DIAGNOSIS — C7800 Secondary malignant neoplasm of unspecified lung: Secondary | ICD-10-CM

## 2022-12-07 DIAGNOSIS — C7951 Secondary malignant neoplasm of bone: Secondary | ICD-10-CM

## 2022-12-07 DIAGNOSIS — D61818 Other pancytopenia: Secondary | ICD-10-CM

## 2022-12-07 LAB — CMP (CANCER CENTER ONLY)
ALT: 18 U/L (ref 0–44)
AST: 15 U/L (ref 15–41)
Albumin: 3.8 g/dL (ref 3.5–5.0)
Alkaline Phosphatase: 65 U/L (ref 38–126)
Anion gap: 7 (ref 5–15)
BUN: 10 mg/dL (ref 6–20)
CO2: 29 mmol/L (ref 22–32)
Calcium: 8.8 mg/dL — ABNORMAL LOW (ref 8.9–10.3)
Chloride: 106 mmol/L (ref 98–111)
Creatinine: 0.6 mg/dL (ref 0.44–1.00)
GFR, Estimated: >60 mL/min (ref >60)
Glucose, Bld: 97 mg/dL (ref 70–99)
Potassium: 4 mmol/L (ref 3.5–5.1)
Sodium: 142 mmol/L (ref 135–145)
Total Bilirubin: 0.3 mg/dL (ref 0.3–1.2)
Total Protein: 6.4 g/dL — ABNORMAL LOW (ref 6.5–8.1)

## 2022-12-07 LAB — CBC WITH DIFFERENTIAL (CANCER CENTER ONLY)
Abs Immature Granulocytes: 0.01 10*3/uL (ref 0.00–0.07)
Basophils Absolute: 0 10*3/uL (ref 0.0–0.1)
Basophils Relative: 1 %
Eosinophils Absolute: 0.1 10*3/uL (ref 0.0–0.5)
Eosinophils Relative: 4 %
HCT: 36.7 % (ref 36.0–46.0)
Hemoglobin: 12.6 g/dL (ref 12.0–15.0)
Immature Granulocytes: 0 %
Lymphocytes Relative: 23 %
Lymphs Abs: 0.7 10*3/uL (ref 0.7–4.0)
MCH: 31.3 pg (ref 26.0–34.0)
MCHC: 34.3 g/dL (ref 30.0–36.0)
MCV: 91.1 fL (ref 80.0–100.0)
Monocytes Absolute: 0.3 10*3/uL (ref 0.1–1.0)
Monocytes Relative: 10 %
Neutro Abs: 1.8 10*3/uL (ref 1.7–7.7)
Neutrophils Relative %: 62 %
Platelet Count: 250 10*3/uL (ref 150–400)
RBC: 4.03 MIL/uL (ref 3.87–5.11)
RDW: 13.2 % (ref 11.5–15.5)
WBC Count: 2.9 10*3/uL — ABNORMAL LOW (ref 4.0–10.5)
nRBC: 0 % (ref 0.0–0.2)

## 2022-12-07 MED ORDER — SODIUM CHLORIDE 0.9 % IV SOLN
10.0000 mg | Freq: Once | INTRAVENOUS | Status: AC
  Administered 2022-12-07: 10 mg via INTRAVENOUS
  Filled 2022-12-07: qty 10

## 2022-12-07 MED ORDER — SODIUM CHLORIDE 0.9 % IV SOLN: Freq: Once | INTRAVENOUS | Status: DC

## 2022-12-07 MED ORDER — SODIUM CHLORIDE 0.9 % IV SOLN
80.0000 mg/m2 | Freq: Once | INTRAVENOUS | Status: AC
  Administered 2022-12-07: 133 mg via INTRAVENOUS
  Filled 2022-12-07: qty 13.3

## 2022-12-07 MED ORDER — SODIUM CHLORIDE 0.9 % IV SOLN: Freq: Once | INTRAVENOUS | Status: AC

## 2022-12-07 MED ORDER — HEPARIN SOD (PORK) LOCK FLUSH 100 UNIT/ML IV SOLN
500.0000 [IU] | Freq: Once | INTRAVENOUS | Status: AC | PRN
  Administered 2022-12-07: 500 [IU]

## 2022-12-07 MED ORDER — PROCHLORPERAZINE MALEATE 10 MG PO TABS
10.0000 mg | ORAL_TABLET | Freq: Once | ORAL | Status: AC
  Administered 2022-12-07: 10 mg via ORAL
  Filled 2022-12-07: qty 1

## 2022-12-07 MED ORDER — SODIUM CHLORIDE 0.9% FLUSH
10.0000 mL | Freq: Once | INTRAVENOUS | Status: AC
  Administered 2022-12-07: 10 mL

## 2022-12-07 MED ORDER — SODIUM CHLORIDE 0.9% FLUSH
10.0000 mL | INTRAVENOUS | Status: DC | PRN
  Administered 2022-12-07: 10 mL

## 2022-12-07 MED ORDER — SODIUM CHLORIDE 0.9 % IV SOLN
720.0000 mg/m2 | Freq: Once | INTRAVENOUS | Status: AC
  Administered 2022-12-07: 1179 mg via INTRAVENOUS
  Filled 2022-12-07: qty 31.01

## 2022-12-07 NOTE — Assessment & Plan Note (Signed)
She has slight worsening leukopenia She will receive G-CSF support due to prior history of infection/neutropenic fever We will proceed with treatment without delay

## 2022-12-07 NOTE — Patient Instructions (Signed)
[facility]  Discharge Instructions: Thank you for choosing [facility] to provide your oncology and hematology care.   If you have a lab appointment with the [facility], please go directly to the [facility] and check in at the registration area.   Wear comfortable clothing and clothing appropriate for easy access to any Portacath or PICC line.   We strive to give you quality time with your provider. You may need to reschedule your appointment if you arrive late (15 or more minutes).  Arriving late affects you and other patients whose appointments are after yours.  Also, if you miss three or more appointments without notifying the office, you may be dismissed from the clinic at the provider's discretion.      For prescription refill requests, have your pharmacy contact our office and allow 72 hours for refills to be completed.    Today you received the following chemotherapy and/or immunotherapy agents: Gemzar & Taxotere       To help prevent nausea and vomiting after your treatment, we encourage you to take your nausea medication as directed.  BELOW ARE SYMPTOMS THAT SHOULD BE REPORTED IMMEDIATELY: *FEVER GREATER THAN 100.4 F (38 C) OR HIGHER *CHILLS OR SWEATING *NAUSEA AND VOMITING THAT IS NOT CONTROLLED WITH YOUR NAUSEA MEDICATION *UNUSUAL SHORTNESS OF BREATH *UNUSUAL BRUISING OR BLEEDING *URINARY PROBLEMS (pain or burning when urinating, or frequent urination) *BOWEL PROBLEMS (unusual diarrhea, constipation, pain near the anus) TENDERNESS IN MOUTH AND THROAT WITH OR WITHOUT PRESENCE OF ULCERS (sore throat, sores in mouth, or a toothache) UNUSUAL RASH, SWELLING OR PAIN  UNUSUAL VAGINAL DISCHARGE OR ITCHING   Items with * indicate a potential emergency and should be followed up as soon as possible or go to the Emergency Department if any problems should occur.  Please show the CHEMOTHERAPY ALERT CARD or IMMUNOTHERAPY ALERT  CARD at check-in to the Emergency Department and triage nurse.  Should you have questions after your visit or need to cancel or reschedule your appointment, please contact [facility]  Dept: [phone]  and follow the prompts.  Office hours are 8:00 a.m. to 4:30 p.m. Monday - Friday. Please note that voicemails left after 4:00 p.m. may not be returned until the following business day.  We are closed weekends and major holidays. You have access to a nurse at all times for urgent questions. Please call the main number to the clinic Dept: [phone] and follow the prompts.   For any non-urgent questions, you may also contact your provider using MyChart. We now offer e-Visits for anyone 37 and older to request care online for non-urgent symptoms. For details visit [email or web address].   Also download the MyChart app! Go to the app store, search "MyChart", open the app, select [facility], and log in with your MyChart username and password.

## 2022-12-07 NOTE — Progress Notes (Signed)
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 She will proceed with chemotherapy today I recommend minimum 3 cycles of therapy before repeating imaging study   Metastasis to bone [facility]) Previously, we have discussed the risk and benefits of Zometa She has a lot of dental work pending She will continue calcium with vitamin D supplement for now  Pancytopenia, acquired ([place]) She has slight worsening leukopenia She will receive G-CSF support due to prior history of infection/neutropenic fever We will proceed with treatment without delay  No orders of the defined types were placed in this encounter.   All questions were answered. The patient knows to call the clinic with any problems, questions or concerns. The total time spent in the appointment was 20 minutes encounter with patients including review of chart and various tests results, discussions about plan of care and coordination of care plan   [name], MD 12/07/2022 2:56 PM  INTERVAL HISTORY: Please see below for problem oriented charting. she returns for treatment follow-up seen in the infusion room She denies side effects from recent treatment The lesion at the back of her skull is getting softer She has minimum pain She is concerned about risk of osteonecrosis of the jaw due to a lot of dental work pending She is not ready to start Zometa  REVIEW OF SYSTEMS:   Constitutional: Denies fevers, chills or abnormal weight loss Eyes: Denies blurriness of vision Ears, nose, mouth, throat, and face: Denies mucositis or sore throat Respiratory: Denies cough, dyspnea or wheezes Cardiovascular: Denies palpitation, chest discomfort or lower extremity swelling Gastrointestinal:  Denies nausea, heartburn or change in bowel habits Skin: Denies abnormal skin  rashes Lymphatics: Denies new lymphadenopathy or easy bruising Neurological:Denies numbness, tingling or new weaknesses Behavioral/Psych: Mood is stable, no new changes  All other systems were reviewed with the patient and are negative.  I have reviewed the past medical history, past surgical history, social history and family history with the patient and they are unchanged from previous note.  ALLERGIES:  is allergic to doxycycline.  MEDICATIONS:  Current Outpatient Medications  Medication Sig Dispense Refill   acetaminophen (TYLENOL) 500 MG tablet Take 1,000 mg by mouth every 6 (six) hours as needed for moderate pain or headache.     calcium carbonate (TUMS - DOSED IN MG ELEMENTAL CALCIUM) 500 MG chewable tablet Chew 1 tablet by mouth 2 (two) times daily.     cholecalciferol (VITAMIN D3) 25 MCG (1000 UNIT) tablet Take 2,000 Units by mouth daily.     dexamethasone (DECADRON) 4 MG tablet Take 0.5 tablets (2 mg total) by mouth daily.     estradiol (ESTRACE) 0.1 MG/GM vaginal cream PLACE FINGER TIP SIZE AMOUNT OF CREAM AND INSERT SLIGHTLY PAST THE VAGINAL ENTRANCE 3 TIMES DAILY 126 g 4   lidocaine (XYLOCAINE) 2 % solution SMARTSIG:By Mouth     lidocaine-prilocaine (EMLA) cream Apply to affected area once 30 g 3   loratadine (CLARITIN) 10 MG tablet Take 10 mg by mouth daily as needed (for bone aches).     LORazepam (ATIVAN) 0.5 MG tablet Take 1 tablet (0.5 mg total) by mouth 2 (two) times daily as needed for anxiety. 30 tablet 0   magic mouthwash (nystatin, diphenhydrAMINE, alum & mag hydroxide) suspension mixture Swish and spit 5 mLs 4 (four) times daily as needed for mouth pain. 240 mL  0   metoprolol succinate (TOPROL XL) 25 MG 24 hr tablet Take 1 tablet (25 mg total) by mouth at bedtime. 30 tablet 6   ondansetron (ZOFRAN) 8 MG tablet Take 1 tablet (8 mg total) by mouth every 8 (eight) hours as needed. 30 tablet 1   prochlorperazine (COMPAZINE) 10 MG tablet Take 1 tablet (10 mg total) by  mouth every 6 (six) hours as needed (Nausea or vomiting). 90 tablet 1   senna (SENOKOT) 8.6 MG TABS tablet Take 2 tablets by mouth at bedtime.     No current facility-administered medications for this visit.    SUMMARY OF ONCOLOGIC HISTORY: Oncology History  Uterine leiomyosarcoma ([place])  06/11/2021 Imaging   1. 9.5 x 7.6 x 9.0 cm complex, partially necrotic, mass involving the lower uterine segment/ cervix. No obvious direct extension into the parametrium.  2. 9 mm left pelvic sidewall lymph node is partially necrotic and worrisome for metastatic adenopathy.  3. No findings for abdominal omental or peritoneal surface disease or adenopathy.  4. Tiny low-attenuation lesion in the pancreatic head, likely benign cyst but attention on follow-up scans is suggested.  5. 2.9 cm fundal fibroid.    06/19/2021 Pathology Results   FINAL MICROSCOPIC DIAGNOSIS:   A. UTERINE, CERVICAL MASS, BIOPSY:  - Spindle cell malignancy.  - See comment.   COMMENT:  The biopsies consist of endocervical mucosa with stromal edema and one biopsy fragment has a microscopic focus with atypical spindle cells consistent with poorly differentiated malignancy.  The differential  includes a spindle cell malignancy such as sarcomatoid carcinoma and leiomyosarcoma.  Mullerian adenosarcoma is also a consideration but considered less likely   06/23/2021 Imaging   MR pelvis  10 cm uterine mass with central necrosis, which is centered in the cervix and lower uterine segment. Right parametrial involvement is seen as well as suspected invasion of the distal rectum. Differential diagnosis includes cervical carcinoma and uterine leiomyosarcoma.   Mild bilateral iliac lymphadenopathy, highly suspicious for metastatic disease.   2.9 cm subserosal fibroid in the posterior fundus.   Normal appearance of both ovaries.     06/29/2021 PET scan   1. Hypermetabolic necrotic cervical/uterine mass with bilateral external iliac  hypermetabolic lymph nodes. No evidence of distant metastatic disease. 2. 1.5 cm low-attenuation left thyroid nodule. Recommend thyroid ultrasound. (Ref: J Am Coll Radiol. 2015 Feb;12(2): 143-50).   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, pleomorphic, and myxoid areas and associated necrosis (~20%) - Tumor based in cervix and also involves lower uterine segment - Cervicovaginal margin involved by focal invasive leiomyosarcoma (3:00-5:00, A10) as well as tumor in lymphovascular spaces - Leiomyosarcoma involves right and left parametrial tissue and extends to parametrial margins - Extensive lymphovascular space invasion present, including in uterus and parametria - See synoptic report and comment   Other findings: - Leiomyomata with hyalinization, size up to 3.0 cm - Ovaries and fallopian tubes with no parenchymal involvement by leiomyosarcoma identified, although adnexal lymphovascular space invasion is present   B: Lymph nodes, right pelvic, lymphadenectomy - One of four lymph nodes positive for metastatic leiomyosarcoma (1/4), with extracapsular extension present   C: Lymph nodes, left pelvic, lymphadenectomy - One of four lymph nodes positive for metastatic leiomyosarcoma (1/4), with extracapsular extension present  Immunohistochemical stains are performed on block A11, and demonstrate that the tumor is positive for desmin and  CD10, with SMA staining the majority of the spindle cell component but largely negative in the epithelioid / pleomorphic component. OSCAR, pancytokeratin AE1/AE3, HMB45, and PR appear negative in the tumor. ER shows patchy weak staining and myogenin stains rare cells. Block A23 also shows positive desmin and negative OSCAR pancytokeratin. Overall, the findings are most consistent with  leiomyosarcoma, with extensive areas that are myxoid, epithelioid, and pleomorphic as well as more typical spindle cell areas within the overall high grade tumor (grade 3 / 3). The tumor is staged as pT2b (involves other pelvic tissues) given the parametrial involvement and pN1 for FIGO stage IIIC.    07/17/2021 Surgery   Date of Surgery: 07/17/21  Preoperative Diagnosis: High Grade Uterine Sarcoma  Postoperative Diagnosis: Same  Procedure(s): Bilateral - RADICAL ABDOMINAL HYSTER, W/BIL TOTAL PELVIC LYMPHADENECTOMY & PARA-AORTIC LYMPH NODE BX W/WO REM TUBE/OVAR VAGINAL HYSTERECTOMY, FOR UTERUS 250 G OR LESS; WITH REPAIR OF ENTEROCELE COLECTOMY, PARTIAL; WITH COLOPROCTOSTOMY (LOW PELVIC ANASTOMOSIS) WITH COLOSTOMY CYSTOURETHROSCOPY, WITH INSERTION OF INDWELLING URETERAL STENT (EG, GIBBONS OR DOUBLE-J TYPE) - Cystourethroscopy - Bilateral ureteral stent placement - Foley catheter placement  Performing Service: Gynecology Oncology Surgeon(s) and Role: Panel 1: * Lafonda Mosses, MD - Primary * Bernadene Bell, MD - Resident - Assisting * Devonne Doughty, MD - Resident - Assisting Panel 2: * Franchot Erichsen, MD - Primary  Drains:  - Left 6Fr open-ended ureteral access catheter (green) - Right 5Fr open-ended ureteral access catheter (white) - 16Fr foley catheter to drainage  * No implants in log *  Indications: 57 y.o. female with high grade uterine sarcoma. Urology was consulted pre-operatively for placement of bilateral ureteral stents. Risks, benefits, and alternatives of the above procedure were discussed and informed consent was signed.  OperativeFindings:  - Grossly distorted architecture of urinary bladder likely 2/2 pelvic mass with anterolaterally positioned UOs - Successful placement of bilateral open-ended ureteral catheters under direct visualization - Foley catheter placed at case conclusion  Description: The patient was correctly identified in the preop  holding area where written informed consent as well potential risk and complication reviewed. She agreed. The patient was brought to the operative suite where a preinduction timeout was performed. Once correct information was verified, general anesthesia was induced. The patient was then gently placed into dorsal lithotomy position with SCDs in place for VTE prophylaxis. They were prepped and draped in the usual sterile fashion and given appropriate preoperative antibiotics. A second timeout was then performed.   We inserted a 33F rigid cystoscope per urethra with copious lubrication and normal saline irrigation running. We performed cystourethroscopy, which revealed the above findings.  We turned our attention to the left ureteral orifice and canulated it with a sensor wire, using assistance of a 6Fr open-ended catheter. The wire was advanced into the renal pelvis without difficulty under visual guidance. We then advanced the stent over our wire into the renal pelvis under direct visualization and feel without complication. The wire was subsequently removed.   We then turned our attention to the right ureteral orifice and canulated it with a sensor wire, using assistance of a 5Fr open-ended catheter. The wire was advanced into the renal pelvis without difficulty under visual guidance. We then advanced the stent over our wire into the renal pelvis under direct visualization and feel without complication. The wire was subsequently removed.   A 16Fr straight catheter was placed, with return of urine indicating appropriate position within the bladder. The balloon was inflated with 10cc  sterile water. The stents were secured to the Foley using 0-silk ties, being careful not to occlude the stents or Foley.   The patient was awoken from general anesthesia having tolerated the procedure well and taken to the PACU for routine post-operative recovery.  Post-Op Plan:  - Foley and stents per primary team     07/17/2021 Surgery   Date of Surgery: 07/17/2021  Pre-op Diagnosis: Uterine spindle cell malignancy  Post-op Diagnosis: Same  Procedure(s): Panel 1 RADICAL ABDOMINAL HYSTER, with bilateral S&O, vagineconty upper, bilateral pelvic lyphadenectomy, bilateral ureterolysis,: [id number] (CPT) Panel 2 CYSTOURETHROSCOPY, WITH INSERTION OF INDWELLING URETERAL STENT (EG, GIBBONS OR DOUBLE-J TYPE): [id number] (CPT) Note: Revisions to procedures should be made in chart - see Procedures activity.  Performing Service: Gynecology Oncology Surgeon(s) and Role: Panel 1: * Lafonda Mosses, MD - Primary * Bernadene Bell, MD - Resident - Assisting * Devonne Doughty, MD - Resident - Assisting Panel 2: * Franchot Erichsen, MD - Primary  Findings: On bimanual exam, 10cm necrotic mass filling upper vagina, unable to discretely palpate the cervix. On rectovaginal exam, rectal involvement not identified. Intraoperatively, normal upper abdominal survey including normal liver, diaphragm, stomach, omentum and bowel. Small uterus with 10cm mass expanding the cervix. Palpably enlarged bilateral pelvic lymph nodes adherent to the external iliac veins and obturator nerves, removed. No palpable para-aortic lymphadenopathy. No rectal involvement of uterine mass.   Specimens:  ID Type Source Tests Collected by Time Destination  1 : uterus,cervix,bilateral tubes/ovaries Tissue Uterus SURGICAL PATHOLOGY EXAM Lafonda Mosses, MD 07/17/2021 0932  2 : right pelvic lymph node Tissue Lymph Node SURGICAL PATHOLOGY EXAM Lafonda Mosses, MD 07/17/2021 1125  3 : LEFT PELVIC LN Tissue Lymph Node SURGICAL PATHOLOGY EXAM Lafonda Mosses, MD 07/17/2021 1144    08/06/2021 Initial Diagnosis   Uterine leiomyosarcoma ([place])   08/06/2021 Cancer Staging   Staging form: Corpus Uteri - Leiomyosarcoma and Endometrial Stromal Sarcoma, AJCC 8th Edition - Pathologic stage from 08/06/2021: FIGO Stage IVB (pT3, pN1, cM1) - Signed by  [name], MD on 08/11/2021 Stage prefix: Initial diagnosis   08/10/2021 Imaging   CT abdomen and pelvis 1. Interval development of left lobe pulmonary nodules, measuring up to 7 mm and highly for metastatic disease. 2. Interval development of small to upper normal lymph nodes in the pelvis, concerning for metastatic disease. 3. Postoperative seroma left pelvic sidewall. 4. Tiny cluster of tree-in-bud opacity in the peripheral right lower lobe is new and compatible with sequelae of atypical infection.   08/13/2021 Procedure   Procedure: Placement of a right IJ approach single lumen PowerPort.  Tip is positioned at the superior cavoatrial junction and catheter is ready for immediate use.  Complications: No immediate   08/14/2021 Echocardiogram    1. Left ventricular ejection fraction, by estimation, is 60 to 65%. The left ventricle has normal function. The left ventricle has no regional wall motion abnormalities. Left ventricular diastolic parameters were normal. The average left ventricular global longitudinal strain is -17.4 %. The global longitudinal strain is normal.  2. Right ventricular systolic function is normal. The right ventricular size is normal.  3. The mitral valve is normal in structure. No evidence of mitral valve regurgitation. No evidence of mitral stenosis.  4. The aortic valve is tricuspid. Aortic valve regurgitation is not visualized. No aortic stenosis is present.  5. The inferior vena cava is normal in size with greater than 50% respiratory variability, suggesting right atrial pressure of 3 mmHg.  08/17/2021 Imaging   Multiple new and enlarging pulmonary nodules scattered throughout the lungs bilaterally, highly concerning for progressive metastatic disease to the lungs   08/18/2021 - 11/10/2021 Chemotherapy   Patient is on Treatment Plan : UTERINE LEIOMYOSARCOMA Doxorubicin q21d x 6 Cycles     08/18/2021 - 08/12/2022 Chemotherapy   Patient is on Treatment Plan :  UTERINE UNDIFFERENTIATED LEIOMYOSARCOMA Gemcitabine D1,8 + Docetaxel D8 (900/100) q21d     11/09/2021 Imaging   IMPRESSION: 1. Multiple small bilateral pulmonary nodules, some of which are slightly increased in size. Other nodules unchanged. 2. Interval decrease in size of left pelvic sidewall lymph nodes. 3. Unchanged size of perirectal lymph nodes or soft tissue nodules. These however demonstrate new internal hypodensity, suggesting treatment response and internal necrosis. 4. Unchanged left iliac lymph node or peritoneal nodule. 5. Findings are consistent with mixed response to treatment. No evidence of new metastatic disease in the chest, abdomen, or pelvis. 6. Wall thickening and mucosal hyperenhancement of the bladder, consistent with nonspecific infectious or inflammatory cystitis. Correlate with urinalysis. 7. Status post hysterectomy and oophorectomy. Interval resolution of a previously noted left pelvic hematoma or seroma. 8. Trace, nonspecific free fluid in the low pelvis.   11/30/2021 Echocardiogram    1. Left ventricular ejection fraction, by estimation, is 40 to 45%. Left ventricular ejection fraction by 3D volume is 41 %. The left ventricle has mildly decreased function. The left ventricle has no regional wall motion abnormalities. Left ventricular  diastolic parameters are consistent with Grade I diastolic dysfunction (impaired relaxation).  2. Right ventricular systolic function is moderately reduced. The right ventricular size is normal.  3. The mitral valve is grossly normal. No evidence of mitral valve regurgitation.  4. The aortic valve is normal in structure. Aortic valve regurgitation is not visualized. No aortic stenosis is present.     12/07/2021 - 05/31/2022 Chemotherapy   Patient is on Treatment Plan : UTERINE UNDIFFERENTIATED / LEIOMYOSARCOMA Gemcitabine D1,8 + Docetaxel D8 (900/100) q21d     02/05/2022 Imaging   Pathologic burst fracture of L3 due to a metastatic  lesion, with probable mild degree of right-sided extraosseous tumor extension in the paraspinal soft tissues, and mild involvement of the right pedicle. No epidural/spinal canal involvement.   Multilevel degenerative disc disease without any significant stenosis in the lumbar spine.   05/31/2022 Imaging   1. Signs of pelvic resolution of pelvic sidewall nodal disease/soft tissue near the LEFT vaginal apex. 2. Decreased conspicuity of RIGHT lower lobe pulmonary nodule and stable LEFT apical pulmonary nodule. 3. Unchanged appearance of pathologic fracture at L3. 4. Urinary bladder wall thickening, slightly improved posteriorly, anterior urinary bladder may show some residual diffuse thickening. Continued correlation with signs of cystitis is suggested.     08/24/2022 Imaging   1. Interval growth of a solitary right external iliac nodal metastasis. No additional sites of new or progressive metastatic disease. 2. Small bilateral upper lobe pulmonary nodules are stable to mildly decreased. 3. Chronic incompletely healed pathologic L3 vertebral fracture with underlying lytic metastasis, not appreciably changed. No new focal osseous lesions. 4. New trace dependent bilateral pleural effusions. 5.  Aortic Atherosclerosis (ICD10-I70.0).     08/27/2022 Procedure   1. Successful L3 Osteocool RFA ablation. 2. Successful L3 kyphoplasty.   10/14/2022 Imaging   Focal soft tissue swelling along the occipital region seen on lateral view may correspond to palpable area of clinical concern. Lytic calvarial lesion underlying this area measures 3.2 cm in craniocaudal dimension, suspicious for osseous  metastasis in the setting of known malignancy. Recommend contrast-enhanced MRI or CT for further evaluation.   10/21/2022 Imaging   MRI brain 1. 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 2. 3 cm midline occipital skull metastasis with extension into the overlying scalp  soft tissues.   10/26/2022 Imaging   1. Multiple new and enlarged bilateral pulmonary nodules, consistent with worsened pulmonary metastatic disease. 2. Interval enlargement of a necrotic appearing right external iliac lymph node, consistent with worsened nodal metastatic disease. 3. Multiple new small peritoneal nodules, consistent with peritoneal metastatic disease. 4. Trace ascites in the low pelvis, presumed malignant. 5. Interval vertebral cement augmentation of a pathologic fracture of L3. 6. Status post hysterectomy.   10/27/2022 Imaging   Bone scan  1. 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 2. No additional foci of abnormal increased uptake suggestive of metastatic disease.   11/05/2022 Echocardiogram   1. Left ventricular ejection fraction, by estimation, is 60 to 65%. The left ventricle has normal function. The left ventricle has no regional wall motion abnormalities. Left ventricular diastolic parameters were normal. The average left ventricular global longitudinal strain is -15.0 %. The global longitudinal strain is abnormal.  2. Right ventricular systolic function is normal. The right ventricular size is normal. Tricuspid regurgitation signal is inadequate for assessing PA pressure.  3. No evidence of mitral valve regurgitation.  4. The aortic valve is grossly normal. Aortic valve regurgitation is not visualized.  5. The inferior vena cava is normal in size with greater than 50% respiratory variability, suggesting right atrial pressure of 3 mmHg.   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 Multiple small bilateral pulmonary nodules, some of which are slightly increased in size. Other nodules unchanged. 2. Interval decrease in size of left pelvic sidewall lymph nodes. 3. Unchanged size of perirectal lymph nodes or soft tissue nodules. These however demonstrate new internal hypodensity, suggesting treatment response and internal necrosis. 4. Unchanged left iliac lymph node or peritoneal nodule. 5. Findings are consistent with mixed response to treatment. No evidence of new metastatic disease in the chest, abdomen, or pelvis. 6. Wall thickening and mucosal hyperenhancement of the bladder, consistent with nonspecific infectious or inflammatory cystitis. Correlate with urinalysis. 7. Status post hysterectomy and oophorectomy. Interval resolution of a previously noted left pelvic hematoma or seroma. 8. Trace, nonspecific free fluid in the low pelvis.   12/07/2021 - 05/31/2022 Chemotherapy   Patient is on Treatment Plan : UTERINE UNDIFFERENTIATED / LEIOMYOSARCOMA Gemcitabine D1,8 + Docetaxel D8 (900/100) q21d     11/24/2022 -  Chemotherapy   Patient is on Treatment Plan : UTERINE UNDIFFERENTIATED LEIOMYOSARCOMA Gemcitabine D1,8 + Docetaxel D8 (900/100) q21d       PHYSICAL EXAMINATION: ECOG PERFORMANCE STATUS: 1 - Symptomatic but completely ambulatory GENERAL:alert, no distress and comfortable NEURO: alert & oriented x 3 with fluent speech, no focal motor/sensory  deficits  LABORATORY DATA:  I have reviewed the data as listed    Component Value Date/Time   NA 142 12/07/2022 0801   K 4.0 12/07/2022 0801   CL 106 12/07/2022 0801   CO2 29 12/07/2022 0801   GLUCOSE 97 12/07/2022 0801   BUN 10 12/07/2022 0801   CREATININE 0.60 12/07/2022 0801   CALCIUM 8.8  (L) 12/07/2022 0801   PROT 6.4 (L) 12/07/2022 0801   ALBUMIN 3.8 12/07/2022 0801   AST 15 12/07/2022 0801   ALT 18 12/07/2022 0801   ALKPHOS 65 12/07/2022 0801   BILITOT 0.3 12/07/2022 0801   GFRNONAA >60 12/07/2022 0801    No results found for: "SPEP", "UPEP"  Lab Results  Component Value Date   WBC 2.9 (L) 12/07/2022   NEUTROABS 1.8 12/07/2022   HGB 12.6 12/07/2022   HCT 36.7 12/07/2022   MCV 91.1 12/07/2022   PLT 250 12/07/2022      Chemistry      Component Value Date/Time   NA 142 12/07/2022 0801   K 4.0 12/07/2022 0801   CL 106 12/07/2022 0801   CO2 29 12/07/2022 0801   BUN 10 12/07/2022 0801   CREATININE 0.60 12/07/2022 0801      Component Value Date/Time   CALCIUM 8.8 (L) 12/07/2022 0801   ALKPHOS 65 12/07/2022 0801   AST 15 12/07/2022 0801   ALT 18 12/07/2022 0801   BILITOT 0.3 12/07/2022 0801

## 2022-12-07 NOTE — Assessment & Plan Note (Signed)
Previously, we have discussed the risk and benefits of Zometa She has a lot of dental work pending She will continue calcium with vitamin D supplement for now

## 2022-12-07 NOTE — Assessment & Plan Note (Signed)
She tolerated recent chemotherapy well She will proceed with chemotherapy today I recommend minimum 3 cycles of therapy before repeating imaging study

## 2022-12-09 ENCOUNTER — Inpatient Hospital Stay: Payer: BC Managed Care – PPO

## 2022-12-09 ENCOUNTER — Other Ambulatory Visit: Payer: Self-pay

## 2022-12-09 VITALS — BP 140/73 | HR 74 | Temp 97.9°F | Resp 16

## 2022-12-09 DIAGNOSIS — C55 Malignant neoplasm of uterus, part unspecified: Secondary | ICD-10-CM | POA: Diagnosis not present

## 2022-12-09 DIAGNOSIS — C7800 Secondary malignant neoplasm of unspecified lung: Secondary | ICD-10-CM

## 2022-12-09 MED ORDER — PEGFILGRASTIM-CBQV 6 MG/0.6ML ~~LOC~~ SOSY
6.0000 mg | PREFILLED_SYRINGE | Freq: Once | SUBCUTANEOUS | Status: AC
  Administered 2022-12-09: 6 mg via SUBCUTANEOUS
  Filled 2022-12-09: qty 0.6

## 2022-12-10 ENCOUNTER — Other Ambulatory Visit: Payer: Self-pay | Admitting: Hematology and Oncology

## 2022-12-10 ENCOUNTER — Telehealth: Payer: Self-pay | Admitting: Oncology

## 2022-12-10 NOTE — Telephone Encounter (Signed)
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  [name] will be added to her treatment on 12/21/22 and will add another 30 minutes to her treatment time.  Also let her know that Dr. Alvy Bimler will discuss this in more detail at her appointment on 12/21/22.  She verbalized understanding and agreement.

## 2022-12-14 ENCOUNTER — Ambulatory Visit
Admission: RE | Admit: 2022-12-14 | Discharge: 2022-12-14 | Disposition: A | Discharge status: Home / Self Care | Payer: BC Managed Care – PPO | Source: Ambulatory Visit | Attending: Radiation Oncology | Admitting: Radiation Oncology

## 2022-12-14 ENCOUNTER — Telehealth: Payer: Self-pay | Admitting: *Deleted

## 2022-12-14 NOTE — Telephone Encounter (Signed)
RETURNED PATIENT'S PHONE CALL, SPOKE WITH PATIENT. ?

## 2022-12-17 ENCOUNTER — Encounter: Payer: Self-pay | Admitting: Radiation Oncology

## 2022-12-17 ENCOUNTER — Ambulatory Visit
Admission: RE | Admit: 2022-12-17 | Discharge: 2022-12-17 | Disposition: A | Discharge status: Home / Self Care | Payer: BC Managed Care – PPO | Source: Ambulatory Visit | Attending: Radiation Oncology | Admitting: Radiation Oncology

## 2022-12-17 VITALS — BP 132/86 | HR 95 | Temp 96.4°F | Resp 18 | Ht 68.0 in | Wt 126.2 lb

## 2022-12-17 DIAGNOSIS — C775 Secondary and unspecified malignant neoplasm of intrapelvic lymph nodes: Secondary | ICD-10-CM | POA: Diagnosis not present

## 2022-12-17 DIAGNOSIS — C7951 Secondary malignant neoplasm of bone: Secondary | ICD-10-CM | POA: Insufficient documentation

## 2022-12-17 DIAGNOSIS — Z923 Personal history of irradiation: Secondary | ICD-10-CM | POA: Insufficient documentation

## 2022-12-17 DIAGNOSIS — C7931 Secondary malignant neoplasm of brain: Secondary | ICD-10-CM | POA: Insufficient documentation

## 2022-12-17 DIAGNOSIS — C541 Malignant neoplasm of endometrium: Secondary | ICD-10-CM | POA: Diagnosis not present

## 2022-12-17 NOTE — Progress Notes (Signed)
Ms. Tammie Gilmore presents today for follow up after completing [place] treatment on 11-12-22.   Does the patient complain of any of the following: Headache: Denies Visual Changes: Reports occasionally noticing an indistinguishable shape in her left periphery, and difficulty focusing Hearing Changes: Denies Nausea: Reports more related to most recent chemotherapy infusion and platelet stimulate injection Vomiting: Denies Balance or coordination issues: Denies Memory issues: Denies  Is the patient currently on a Decadron regimen? : Only related to chemotherapy regimen   Additional comments: Nothing else of note

## 2022-12-17 NOTE — Progress Notes (Signed)
Radiation Oncology         [phone]) [phone] ________________________________  Name: Tammie Gilmore MRN: [id number]  Date: 12/17/2022  DOB: 03-23-1966  Follow-Up Visit Note  Outpatient  CC: [name], [name], MD  [name], MD  Diagnosis and Prior Radiotherapy:    ICD-10-CM   1. 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  Tammie Gilmore presents today for follow up after completing [place] treatment on 11-12-22.   Does the patient complain of any of the following: Headache: Denies Visual Changes: Reports occasionally noticing an indistinguishable shape in her left periphery, and difficulty focusing Hearing Changes: Denies Nausea: Reports more related to most recent chemotherapy infusion and platelet stimulate injection Vomiting: Denies Balance or coordination issues: Denies Memory issues: Denies  Is the patient currently on a Decadron regimen? : Only related to chemotherapy regimen   Additional comments: Nothing else of note                                  ALLERGIES:  is allergic to doxycycline.  Meds: Current Outpatient Medications  Medication Sig Dispense Refill   acetaminophen (TYLENOL) 500 MG tablet Take 1,000 mg by mouth every 6 (six) hours as needed for moderate pain or headache.     calcium carbonate (TUMS - DOSED IN MG ELEMENTAL CALCIUM) 500 MG chewable tablet Chew 1 tablet by mouth 2 (two) times daily.     cholecalciferol (VITAMIN D3) 25 MCG (1000 UNIT) tablet Take 2,000 Units by mouth daily.     dexamethasone (DECADRON) 4 MG tablet Take 0.5 tablets (2 mg total) by mouth daily.     estradiol (ESTRACE) 0.1 MG/GM vaginal cream PLACE FINGER TIP SIZE AMOUNT OF CREAM AND INSERT SLIGHTLY PAST THE VAGINAL ENTRANCE 3 TIMES DAILY 126 g 4   lidocaine (XYLOCAINE) 2 % solution SMARTSIG:By Mouth     lidocaine-prilocaine (EMLA) cream Apply to affected  area once 30 g 3   loratadine (CLARITIN) 10 MG tablet Take 10 mg by mouth daily as needed (for bone aches).     LORazepam (ATIVAN) 0.5 MG tablet Take 1 tablet (0.5 mg total) by mouth 2 (two) times daily as needed for anxiety. 30 tablet 0   magic mouthwash (nystatin, diphenhydrAMINE, alum & mag hydroxide) suspension mixture Swish and spit 5 mLs 4 (four) times daily as needed for mouth pain. 240 mL 0   metoprolol succinate (TOPROL XL) 25 MG 24 hr tablet Take 1 tablet (25 mg total) by mouth at bedtime. 30 tablet 6   ondansetron (ZOFRAN) 8 MG tablet Take 1 tablet (8 mg total) by mouth every 8 (eight) hours as needed. 30 tablet 1   prochlorperazine (COMPAZINE) 10 MG tablet Take 1 tablet (10 mg total) by mouth every 6 (six) hours as needed (Nausea or vomiting). 90 tablet 1   senna (SENOKOT) 8.6 MG TABS tablet Take 2 tablets by mouth at bedtime.     No current facility-administered medications for this encounter.    Physical Findings: The patient is in no acute distress. Patient is alert and oriented.  height is '5\' 8"'$  (1.727 m) and weight is 126 lb 4 oz (57.3 kg). Her temporal temperature is 96.4 F (35.8 C) (abnormal). Her blood pressure is 132/86 and her pulse is 95. Her respiration is 18 and oxygen  saturation is 100%. .    General: Alert and oriented, in no acute distress HEENT: Head is normocephalic. Extraocular movements are intact. Oropharynx is clear. Skin:posterior scalp alopecia. Musculoskeletal: symmetric strength and muscle tone throughout. Mass of skull posteriorly remains in previous radiation fields for skull met Neurologic: Cranial nerves II through XII are grossly intact. No obvious focalities. Speech is fluent. Coordination is intact. Psychiatric: Judgment and insight are intact. Affect is appropriate.    Lab Findings: Lab Results  Component Value Date   WBC 2.9 (L) 12/07/2022   HGB 12.6 12/07/2022   HCT 36.7 12/07/2022   MCV 91.1 12/07/2022   PLT 250 12/07/2022     Radiographic Findings: No results found.  Impression/Plan:  She is recovering well from [facility] to the brain.  I am doubtful that her visual symptoms are related to her brain disease or tx. She reports cataracts and is due to see eye doctor. She will make an appt w/ them and call us if new neurologic sx arise. Otherwise, plan for MRI brain in 2 mo and see [facility] at that time.  On date of service, in total, I spent 25 minutes on this encounter. Patient was seen in person.  _____________________________________   Eppie Gibson, MD

## 2022-12-21 ENCOUNTER — Inpatient Hospital Stay: Payer: BC Managed Care – PPO

## 2022-12-21 ENCOUNTER — Encounter: Payer: Self-pay | Admitting: Hematology and Oncology

## 2022-12-21 ENCOUNTER — Other Ambulatory Visit: Payer: Self-pay | Admitting: Hematology and Oncology

## 2022-12-21 ENCOUNTER — Other Ambulatory Visit: Payer: Self-pay

## 2022-12-21 ENCOUNTER — Inpatient Hospital Stay: Payer: BC Managed Care – PPO | Attending: Gynecologic Oncology | Admitting: Hematology and Oncology

## 2022-12-21 VITALS — BP 120/71 | HR 88 | Temp 97.8°F | Resp 18 | Ht 68.0 in | Wt 128.4 lb

## 2022-12-21 VITALS — BP 110/72 | HR 76 | Resp 18

## 2022-12-21 DIAGNOSIS — R5383 Other fatigue: Secondary | ICD-10-CM | POA: Diagnosis not present

## 2022-12-21 DIAGNOSIS — C7800 Secondary malignant neoplasm of unspecified lung: Secondary | ICD-10-CM

## 2022-12-21 DIAGNOSIS — C775 Secondary and unspecified malignant neoplasm of intrapelvic lymph nodes: Secondary | ICD-10-CM | POA: Diagnosis not present

## 2022-12-21 DIAGNOSIS — R609 Edema, unspecified: Secondary | ICD-10-CM | POA: Insufficient documentation

## 2022-12-21 DIAGNOSIS — D6481 Anemia due to antineoplastic chemotherapy: Secondary | ICD-10-CM | POA: Insufficient documentation

## 2022-12-21 DIAGNOSIS — Z7952 Long term (current) use of systemic steroids: Secondary | ICD-10-CM | POA: Diagnosis not present

## 2022-12-21 DIAGNOSIS — R918 Other nonspecific abnormal finding of lung field: Secondary | ICD-10-CM | POA: Diagnosis not present

## 2022-12-21 DIAGNOSIS — C7951 Secondary malignant neoplasm of bone: Secondary | ICD-10-CM | POA: Diagnosis not present

## 2022-12-21 DIAGNOSIS — C55 Malignant neoplasm of uterus, part unspecified: Secondary | ICD-10-CM

## 2022-12-21 DIAGNOSIS — I700 Atherosclerosis of aorta: Secondary | ICD-10-CM | POA: Diagnosis not present

## 2022-12-21 DIAGNOSIS — C7931 Secondary malignant neoplasm of brain: Secondary | ICD-10-CM | POA: Diagnosis not present

## 2022-12-21 DIAGNOSIS — R110 Nausea: Secondary | ICD-10-CM

## 2022-12-21 DIAGNOSIS — R0981 Nasal congestion: Secondary | ICD-10-CM | POA: Insufficient documentation

## 2022-12-21 DIAGNOSIS — T451X5A Adverse effect of antineoplastic and immunosuppressive drugs, initial encounter: Secondary | ICD-10-CM | POA: Insufficient documentation

## 2022-12-21 DIAGNOSIS — M898X9 Other specified disorders of bone, unspecified site: Secondary | ICD-10-CM | POA: Diagnosis not present

## 2022-12-21 LAB — CMP (CANCER CENTER ONLY)
ALT: 16 U/L (ref 0–44)
AST: 15 U/L (ref 15–41)
Albumin: 3.9 g/dL (ref 3.5–5.0)
Alkaline Phosphatase: 79 U/L (ref 38–126)
Anion gap: 8 (ref 5–15)
BUN: 14 mg/dL (ref 6–20)
CO2: 27 mmol/L (ref 22–32)
Calcium: 8.9 mg/dL (ref 8.9–10.3)
Chloride: 107 mmol/L (ref 98–111)
Creatinine: 0.7 mg/dL (ref 0.44–1.00)
GFR, Estimated: >60 mL/min (ref >60)
Glucose, Bld: 137 mg/dL — ABNORMAL HIGH (ref 70–99)
Potassium: 3.7 mmol/L (ref 3.5–5.1)
Sodium: 142 mmol/L (ref 135–145)
Total Bilirubin: 0.4 mg/dL (ref 0.3–1.2)
Total Protein: 6.3 g/dL — ABNORMAL LOW (ref 6.5–8.1)

## 2022-12-21 LAB — CBC WITH DIFFERENTIAL (CANCER CENTER ONLY)
Abs Immature Granulocytes: 0.05 10*3/uL (ref 0.00–0.07)
Basophils Absolute: 0 10*3/uL (ref 0.0–0.1)
Basophils Relative: 1 %
Eosinophils Absolute: 0 10*3/uL (ref 0.0–0.5)
Eosinophils Relative: 0 %
HCT: 35.1 % — ABNORMAL LOW (ref 36.0–46.0)
Hemoglobin: 11.7 g/dL — ABNORMAL LOW (ref 12.0–15.0)
Immature Granulocytes: 1 %
Lymphocytes Relative: 11 %
Lymphs Abs: 0.6 10*3/uL — ABNORMAL LOW (ref 0.7–4.0)
MCH: 31 pg (ref 26.0–34.0)
MCHC: 33.3 g/dL (ref 30.0–36.0)
MCV: 93.1 fL (ref 80.0–100.0)
Monocytes Absolute: 0.3 10*3/uL (ref 0.1–1.0)
Monocytes Relative: 5 %
Neutro Abs: 5 10*3/uL (ref 1.7–7.7)
Neutrophils Relative %: 82 %
Platelet Count: 168 10*3/uL (ref 150–400)
RBC: 3.77 MIL/uL — ABNORMAL LOW (ref 3.87–5.11)
RDW: 14.9 % (ref 11.5–15.5)
WBC Count: 6 10*3/uL (ref 4.0–10.5)
nRBC: 0 % (ref 0.0–0.2)

## 2022-12-21 LAB — T4, FREE: Free T4: 0.95 ng/dL (ref 0.61–1.12)

## 2022-12-21 LAB — TSH: TSH: 1.251 u[IU]/mL (ref 0.350–4.500)

## 2022-12-21 MED ORDER — HEPARIN SOD (PORK) LOCK FLUSH 100 UNIT/ML IV SOLN: 500.0000 [IU] | Freq: Once | INTRAVENOUS | Status: DC | PRN

## 2022-12-21 MED ORDER — SODIUM CHLORIDE 0.9 % IV SOLN
200.0000 mg | Freq: Once | INTRAVENOUS | Status: AC
  Administered 2022-12-21: 200 mg via INTRAVENOUS
  Filled 2022-12-21: qty 8

## 2022-12-21 MED ORDER — ALTEPLASE 2 MG IJ SOLR: 2.0000 mg | Freq: Once | INTRAMUSCULAR | Status: DC | PRN

## 2022-12-21 MED ORDER — HEPARIN SOD (PORK) LOCK FLUSH 100 UNIT/ML IV SOLN
500.0000 [IU] | Freq: Once | INTRAVENOUS | Status: AC | PRN
  Administered 2022-12-21: 500 [IU]

## 2022-12-21 MED ORDER — SODIUM CHLORIDE 0.9% FLUSH: 10.0000 mL | Freq: Once | INTRAVENOUS | Status: DC

## 2022-12-21 MED ORDER — SODIUM CHLORIDE 0.9% FLUSH
10.0000 mL | INTRAVENOUS | Status: DC | PRN
  Administered 2022-12-21: 10 mL

## 2022-12-21 MED ORDER — SODIUM CHLORIDE 0.9 % IV SOLN
720.0000 mg/m2 | Freq: Once | INTRAVENOUS | Status: AC
  Administered 2022-12-21: 1178 mg via INTRAVENOUS
  Filled 2022-12-21: qty 30.98

## 2022-12-21 MED ORDER — HEPARIN SOD (PORK) LOCK FLUSH 100 UNIT/ML IV SOLN: 250.0000 [IU] | Freq: Once | INTRAVENOUS | Status: DC | PRN

## 2022-12-21 MED ORDER — SODIUM CHLORIDE 0.9 % IV SOLN: Freq: Once | INTRAVENOUS | Status: DC

## 2022-12-21 MED ORDER — SODIUM CHLORIDE 0.9 % IV SOLN: Freq: Once | INTRAVENOUS | Status: AC

## 2022-12-21 MED ORDER — PROCHLORPERAZINE MALEATE 10 MG PO TABS
10.0000 mg | ORAL_TABLET | Freq: Once | ORAL | Status: AC
  Administered 2022-12-21: 10 mg via ORAL
  Filled 2022-12-21: qty 1

## 2022-12-21 MED ORDER — ONDANSETRON HCL 4 MG/2ML IJ SOLN
8.0000 mg | Freq: Once | INTRAMUSCULAR | Status: AC
  Administered 2022-12-21: 8 mg via INTRAVENOUS
  Filled 2022-12-21: qty 4

## 2022-12-21 MED ORDER — SODIUM CHLORIDE 0.9% FLUSH: 3.0000 mL | Freq: Once | INTRAVENOUS | Status: DC | PRN

## 2022-12-21 MED ORDER — SODIUM CHLORIDE 0.9% FLUSH
10.0000 mL | Freq: Once | INTRAVENOUS | Status: AC | PRN
  Administered 2022-12-21: 10 mL

## 2022-12-21 NOTE — Progress Notes (Signed)
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 showed that her tumor is PD-L1 strongly positive at 80% We discussed the rationale behind addition of immunotherapy with pembrolizumab The risk, benefits, side effects of pembrolizumab is discussed and she is willing to proceed I will add pembrolizumab to day 1 of treatment once a month and she is in agreement We will monitor her thyroid function closely I plan to repeat imaging study after 3 cycles of chemotherapy  Anemia due to antineoplastic chemotherapy She is somewhat fatigued We will proceed with treatment without delay  Orders Placed This Encounter  Procedures   T4, free    Standing Status:   Standing    Number of Occurrences:   22    Standing Expiration Date:   12/21/2023   TSH    Standing Status:   Standing    Number of Occurrences:   22    Standing Expiration Date:   12/21/2023    All questions were answered. The patient knows to call the clinic with any problems, questions or concerns. The total time spent in the appointment was 30 minutes encounter with patients including review of chart and various tests results, discussions about plan of care and coordination of care plan   [name], MD 12/21/2022 11:46 AM  INTERVAL HISTORY: Please see below for problem oriented charting. she returns for treatment follow-up with her husband We spent majority of her time discussing results from recent molecular studies She tolerated last cycle of treatment well except for bone pain with G-CSF She has minor sinus congestion but no fever or chills  REVIEW OF SYSTEMS:   Constitutional: Denies fevers, chills or abnormal weight loss Eyes: Denies blurriness of vision Ears, nose,  mouth, throat, and face: Denies mucositis or sore throat Respiratory: Denies cough, dyspnea or wheezes Cardiovascular: Denies palpitation, chest discomfort or lower extremity swelling Gastrointestinal:  Denies nausea, heartburn or change in bowel habits Skin: Denies abnormal skin rashes Lymphatics: Denies new lymphadenopathy or easy bruising Neurological:Denies numbness, tingling or new weaknesses Behavioral/Psych: Mood is stable, no new changes  All other systems were reviewed with the patient and are negative.  I have reviewed the past medical history, past surgical history, social history and family history with the patient and they are unchanged from previous note.  ALLERGIES:  is allergic to doxycycline.  MEDICATIONS:  Current Outpatient Medications  Medication Sig Dispense Refill   acetaminophen (TYLENOL) 500 MG tablet Take 1,000 mg by mouth every 6 (six) hours as needed for moderate pain or headache.     calcium carbonate (TUMS - DOSED IN MG ELEMENTAL CALCIUM) 500 MG chewable tablet Chew 1 tablet by mouth 2 (two) times daily.     cholecalciferol (VITAMIN D3) 25 MCG (1000 UNIT) tablet Take 2,000 Units by mouth daily.     dexamethasone (DECADRON) 4 MG tablet Take 0.5 tablets (2 mg total) by mouth daily.     estradiol (ESTRACE) 0.1 MG/GM vaginal cream PLACE FINGER TIP SIZE AMOUNT OF CREAM AND INSERT SLIGHTLY PAST THE VAGINAL ENTRANCE 3 TIMES DAILY 126 g 4   lidocaine (XYLOCAINE) 2 % solution SMARTSIG:By Mouth     lidocaine-prilocaine (EMLA) cream Apply to affected area once 30 g 3   loratadine (CLARITIN) 10 MG tablet Take  10 mg by mouth daily as needed (for bone aches).     LORazepam (ATIVAN) 0.5 MG tablet Take 1 tablet (0.5 mg total) by mouth 2 (two) times daily as needed for anxiety. 30 tablet 0   magic mouthwash (nystatin, diphenhydrAMINE, alum & mag hydroxide) suspension mixture Swish and spit 5 mLs 4 (four) times daily as needed for mouth pain. 240 mL 0   metoprolol succinate  (TOPROL XL) 25 MG 24 hr tablet Take 1 tablet (25 mg total) by mouth at bedtime. 30 tablet 6   ondansetron (ZOFRAN) 8 MG tablet Take 1 tablet (8 mg total) by mouth every 8 (eight) hours as needed. 30 tablet 1   prochlorperazine (COMPAZINE) 10 MG tablet Take 1 tablet (10 mg total) by mouth every 6 (six) hours as needed (Nausea or vomiting). 90 tablet 1   senna (SENOKOT) 8.6 MG TABS tablet Take 2 tablets by mouth at bedtime.     No current facility-administered medications for this visit.   Facility-Administered Medications Ordered in Other Visits  Medication Dose Route Frequency Provider Last Rate Last Admin   gemcitabine (GEMZAR) 1,178 mg in sodium chloride 0.9 % 250 mL chemo infusion  720 mg/m2 (Treatment Plan Recorded) Intravenous Once Alvy Bimler, Alif Petrak, MD       heparin lock flush 100 unit/mL  500 Units Intracatheter Once PRN Alvy Bimler, Berlyn Malina, MD       sodium chloride flush (NS) 0.9 % injection 10 mL  10 mL Intracatheter PRN Alvy Bimler, Aleister Lady, MD        SUMMARY OF ONCOLOGIC HISTORY: Oncology History  Uterine leiomyosarcoma ([place])  06/11/2021 Imaging   1. 9.5 x 7.6 x 9.0 cm complex, partially necrotic, mass involving the lower uterine segment/ cervix. No obvious direct extension into the parametrium.  2. 9 mm left pelvic sidewall lymph node is partially necrotic and worrisome for metastatic adenopathy.  3. No findings for abdominal omental or peritoneal surface disease or adenopathy.  4. Tiny low-attenuation lesion in the pancreatic head, likely benign cyst but attention on follow-up scans is suggested.  5. 2.9 cm fundal fibroid.    06/19/2021 Pathology Results   FINAL MICROSCOPIC DIAGNOSIS:   A. UTERINE, CERVICAL MASS, BIOPSY:  - Spindle cell malignancy.  - See comment.   COMMENT:  The biopsies consist of endocervical mucosa with stromal edema and one biopsy fragment has a microscopic focus with atypical spindle cells consistent with poorly differentiated malignancy.  The differential  includes a  spindle cell malignancy such as sarcomatoid carcinoma and leiomyosarcoma.  Mullerian adenosarcoma is also a consideration but considered less likely   06/23/2021 Imaging   MR pelvis  10 cm uterine mass with central necrosis, which is centered in the cervix and lower uterine segment. Right parametrial involvement is seen as well as suspected invasion of the distal rectum. Differential diagnosis includes cervical carcinoma and uterine leiomyosarcoma.   Mild bilateral iliac lymphadenopathy, highly suspicious for metastatic disease.   2.9 cm subserosal fibroid in the posterior fundus.   Normal appearance of both ovaries.     06/29/2021 PET scan   1. Hypermetabolic necrotic cervical/uterine mass with bilateral external iliac hypermetabolic lymph nodes. No evidence of distant metastatic disease. 2. 1.5 cm low-attenuation left thyroid nodule. Recommend thyroid ultrasound. (Ref: J Am Coll Radiol. 2015 Feb;12(2): 143-50).   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, pleomorphic, and myxoid areas and  associated necrosis (~20%) - Tumor based in cervix and also involves lower uterine segment - Cervicovaginal margin involved by focal invasive leiomyosarcoma (3:00-5:00, A10) as well as tumor in lymphovascular spaces - Leiomyosarcoma involves right and left parametrial tissue and extends to parametrial margins - Extensive lymphovascular space invasion present, including in uterus and parametria - See synoptic report and comment   Other findings: - Leiomyomata with hyalinization, size up to 3.0 cm - Ovaries and fallopian tubes with no parenchymal involvement by leiomyosarcoma identified, although adnexal lymphovascular space invasion is present   B: Lymph nodes, right pelvic, lymphadenectomy - One of four lymph nodes positive  for metastatic leiomyosarcoma (1/4), with extracapsular extension present   C: Lymph nodes, left pelvic, lymphadenectomy - One of four lymph nodes positive for metastatic leiomyosarcoma (1/4), with extracapsular extension present  Immunohistochemical stains are performed on block A11, and demonstrate that the tumor is positive for desmin and CD10, with SMA staining the majority of the spindle cell component but largely negative in the epithelioid / pleomorphic component. OSCAR, pancytokeratin AE1/AE3, HMB45, and PR appear negative in the tumor. ER shows patchy weak staining and myogenin stains rare cells. Block A23 also shows positive desmin and negative OSCAR pancytokeratin. Overall, the findings are most consistent with leiomyosarcoma, with extensive areas that are myxoid, epithelioid, and pleomorphic as well as more typical spindle cell areas within the overall high grade tumor (grade 3 / 3). The tumor is staged as pT2b (involves other pelvic tissues) given the parametrial involvement and pN1 for FIGO stage IIIC.    07/17/2021 Surgery   Date of Surgery: 07/17/21  Preoperative Diagnosis: High Grade Uterine Sarcoma  Postoperative Diagnosis: Same  Procedure(s): Bilateral - RADICAL ABDOMINAL HYSTER, W/BIL TOTAL PELVIC LYMPHADENECTOMY & PARA-AORTIC LYMPH NODE BX W/WO REM TUBE/OVAR VAGINAL HYSTERECTOMY, FOR UTERUS 250 G OR LESS; WITH REPAIR OF ENTEROCELE COLECTOMY, PARTIAL; WITH COLOPROCTOSTOMY (LOW PELVIC ANASTOMOSIS) WITH COLOSTOMY CYSTOURETHROSCOPY, WITH INSERTION OF INDWELLING URETERAL STENT (EG, GIBBONS OR DOUBLE-J TYPE) - Cystourethroscopy - Bilateral ureteral stent placement - Foley catheter placement  Performing Service: Gynecology Oncology Surgeon(s) and Role: Panel 1: * Lafonda Mosses, MD - Primary * Bernadene Bell, MD - Resident - Assisting * Devonne Doughty, MD - Resident - Assisting Panel 2: * Franchot Erichsen, MD - Primary  Drains:  - Left 6Fr open-ended  ureteral access catheter (green) - Right 5Fr open-ended ureteral access catheter (white) - 16Fr foley catheter to drainage  * No implants in log *  Indications: 57 y.o. female with high grade uterine sarcoma. Urology was consulted pre-operatively for placement of bilateral ureteral stents. Risks, benefits, and alternatives of the above procedure were discussed and informed consent was signed.  OperativeFindings:  - Grossly distorted architecture of urinary bladder likely 2/2 pelvic mass with anterolaterally positioned UOs - Successful placement of bilateral open-ended ureteral catheters under direct visualization - Foley catheter placed at case conclusion  Description: The patient was correctly identified in the preop holding area where written informed consent as well potential risk and complication reviewed. She agreed. The patient was brought to the operative suite where a preinduction timeout was performed. Once correct information was verified, general anesthesia was induced. The patient was then gently placed into dorsal lithotomy position with SCDs in place for VTE prophylaxis. They were prepped and draped in the usual sterile fashion and given appropriate preoperative antibiotics. A second timeout was then performed.   We inserted a 81F rigid cystoscope per urethra with copious lubrication and normal saline irrigation  running. We performed cystourethroscopy, which revealed the above findings.  We turned our attention to the left ureteral orifice and canulated it with a sensor wire, using assistance of a 6Fr open-ended catheter. The wire was advanced into the renal pelvis without difficulty under visual guidance. We then advanced the stent over our wire into the renal pelvis under direct visualization and feel without complication. The wire was subsequently removed.   We then turned our attention to the right ureteral orifice and canulated it with a sensor wire, using assistance of a 5Fr  open-ended catheter. The wire was advanced into the renal pelvis without difficulty under visual guidance. We then advanced the stent over our wire into the renal pelvis under direct visualization and feel without complication. The wire was subsequently removed.   A 16Fr straight catheter was placed, with return of urine indicating appropriate position within the bladder. The balloon was inflated with 10cc sterile water. The stents were secured to the Foley using 0-silk ties, being careful not to occlude the stents or Foley.   The patient was awoken from general anesthesia having tolerated the procedure well and taken to the PACU for routine post-operative recovery.  Post-Op Plan:  - Foley and stents per primary team    07/17/2021 Surgery   Date of Surgery: 07/17/2021  Pre-op Diagnosis: Uterine spindle cell malignancy  Post-op Diagnosis: Same  Procedure(s): Panel 1 RADICAL ABDOMINAL HYSTER, with bilateral S&O, vagineconty upper, bilateral pelvic lyphadenectomy, bilateral ureterolysis,: [id number] (CPT) Panel 2 CYSTOURETHROSCOPY, WITH INSERTION OF INDWELLING URETERAL STENT (EG, GIBBONS OR DOUBLE-J TYPE): [id number] (CPT) Note: Revisions to procedures should be made in chart - see Procedures activity.  Performing Service: Gynecology Oncology Surgeon(s) and Role: Panel 1: * Lafonda Mosses, MD - Primary * Bernadene Bell, MD - Resident - Assisting * Devonne Doughty, MD - Resident - Assisting Panel 2: * Franchot Erichsen, MD - Primary  Findings: On bimanual exam, 10cm necrotic mass filling upper vagina, unable to discretely palpate the cervix. On rectovaginal exam, rectal involvement not identified. Intraoperatively, normal upper abdominal survey including normal liver, diaphragm, stomach, omentum and bowel. Small uterus with 10cm mass expanding the cervix. Palpably enlarged bilateral pelvic lymph nodes adherent to the external iliac veins and obturator nerves, removed. No palpable  para-aortic lymphadenopathy. No rectal involvement of uterine mass.   Specimens:  ID Type Source Tests Collected by Time Destination  1 : uterus,cervix,bilateral tubes/ovaries Tissue Uterus SURGICAL PATHOLOGY EXAM Lafonda Mosses, MD 07/17/2021 0932  2 : right pelvic lymph node Tissue Lymph Node SURGICAL PATHOLOGY EXAM Lafonda Mosses, MD 07/17/2021 1125  3 : LEFT PELVIC LN Tissue Lymph Node SURGICAL PATHOLOGY EXAM Lafonda Mosses, MD 07/17/2021 1144    08/06/2021 Initial Diagnosis   Uterine leiomyosarcoma ([place])   08/06/2021 Cancer Staging   Staging form: Corpus Uteri - Leiomyosarcoma and Endometrial Stromal Sarcoma, AJCC 8th Edition - Pathologic stage from 08/06/2021: FIGO Stage IVB (pT3, pN1, cM1) - Signed by [name], MD on 08/11/2021 Stage prefix: Initial diagnosis   08/10/2021 Imaging   CT abdomen and pelvis 1. Interval development of left lobe pulmonary nodules, measuring up to 7 mm and highly for metastatic disease. 2. Interval development of small to upper normal lymph nodes in the pelvis, concerning for metastatic disease. 3. Postoperative seroma left pelvic sidewall. 4. Tiny cluster of tree-in-bud opacity in the peripheral right lower lobe is new and compatible with sequelae of atypical infection.   08/13/2021 Procedure   Procedure: Placement of a  right IJ approach single lumen PowerPort.  Tip is positioned at the superior cavoatrial junction and catheter is ready for immediate use.  Complications: No immediate   08/14/2021 Echocardiogram    1. Left ventricular ejection fraction, by estimation, is 60 to 65%. The left ventricle has normal function. The left ventricle has no regional wall motion abnormalities. Left ventricular diastolic parameters were normal. The average left ventricular global longitudinal strain is -17.4 %. The global longitudinal strain is normal.  2. Right ventricular systolic function is normal. The right ventricular size is normal.  3. The mitral  valve is normal in structure. No evidence of mitral valve regurgitation. No evidence of mitral stenosis.  4. The aortic valve is tricuspid. Aortic valve regurgitation is not visualized. No aortic stenosis is present.  5. The inferior vena cava is normal in size with greater than 50% respiratory variability, suggesting right atrial pressure of 3 mmHg.     08/17/2021 Imaging   Multiple new and enlarging pulmonary nodules scattered throughout the lungs bilaterally, highly concerning for progressive metastatic disease to the lungs   08/18/2021 - 11/10/2021 Chemotherapy   Patient is on Treatment Plan : UTERINE LEIOMYOSARCOMA Doxorubicin q21d x 6 Cycles     08/18/2021 - 08/12/2022 Chemotherapy   Patient is on Treatment Plan : UTERINE UNDIFFERENTIATED LEIOMYOSARCOMA Gemcitabine D1,8 + Docetaxel D8 (900/100) q21d     11/09/2021 Imaging   IMPRESSION: 1. Multiple small bilateral pulmonary nodules, some of which are slightly increased in size. Other nodules unchanged. 2. Interval decrease in size of left pelvic sidewall lymph nodes. 3. Unchanged size of perirectal lymph nodes or soft tissue nodules. These however demonstrate new internal hypodensity, suggesting treatment response and internal necrosis. 4. Unchanged left iliac lymph node or peritoneal nodule. 5. Findings are consistent with mixed response to treatment. No evidence of new metastatic disease in the chest, abdomen, or pelvis. 6. Wall thickening and mucosal hyperenhancement of the bladder, consistent with nonspecific infectious or inflammatory cystitis. Correlate with urinalysis. 7. Status post hysterectomy and oophorectomy. Interval resolution of a previously noted left pelvic hematoma or seroma. 8. Trace, nonspecific free fluid in the low pelvis.   11/30/2021 Echocardiogram    1. Left ventricular ejection fraction, by estimation, is 40 to 45%. Left ventricular ejection fraction by 3D volume is 41 %. The left ventricle has mildly decreased  function. The left ventricle has no regional wall motion abnormalities. Left ventricular  diastolic parameters are consistent with Grade I diastolic dysfunction (impaired relaxation).  2. Right ventricular systolic function is moderately reduced. The right ventricular size is normal.  3. The mitral valve is grossly normal. No evidence of mitral valve regurgitation.  4. The aortic valve is normal in structure. Aortic valve regurgitation is not visualized. No aortic stenosis is present.     12/07/2021 - 05/31/2022 Chemotherapy   Patient is on Treatment Plan : UTERINE UNDIFFERENTIATED / LEIOMYOSARCOMA Gemcitabine D1,8 + Docetaxel D8 (900/100) q21d     02/05/2022 Imaging   Pathologic burst fracture of L3 due to a metastatic lesion, with probable mild degree of right-sided extraosseous tumor extension in the paraspinal soft tissues, and mild involvement of the right pedicle. No epidural/spinal canal involvement.   Multilevel degenerative disc disease without any significant stenosis in the lumbar spine.   05/31/2022 Imaging   1. Signs of pelvic resolution of pelvic sidewall nodal disease/soft tissue near the LEFT vaginal apex. 2. Decreased conspicuity of RIGHT lower lobe pulmonary nodule and stable LEFT apical pulmonary nodule. 3. Unchanged appearance of  pathologic fracture at L3. 4. Urinary bladder wall thickening, slightly improved posteriorly, anterior urinary bladder may show some residual diffuse thickening. Continued correlation with signs of cystitis is suggested.     08/24/2022 Imaging   1. Interval growth of a solitary right external iliac nodal metastasis. No additional sites of new or progressive metastatic disease. 2. Small bilateral upper lobe pulmonary nodules are stable to mildly decreased. 3. Chronic incompletely healed pathologic L3 vertebral fracture with underlying lytic metastasis, not appreciably changed. No new focal osseous lesions. 4. New trace dependent bilateral pleural  effusions. 5.  Aortic Atherosclerosis (ICD10-I70.0).     08/27/2022 Procedure   1. Successful L3 Osteocool RFA ablation. 2. Successful L3 kyphoplasty.   10/14/2022 Imaging   Focal soft tissue swelling along the occipital region seen on lateral view may correspond to palpable area of clinical concern. Lytic calvarial lesion underlying this area measures 3.2 cm in craniocaudal dimension, suspicious for osseous metastasis in the setting of known malignancy. Recommend contrast-enhanced MRI or CT for further evaluation.   10/21/2022 Imaging   MRI brain 1. 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 2. 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   10/26/2022 Imaging   1. Multiple new and enlarged bilateral pulmonary nodules, consistent with worsened pulmonary metastatic disease. 2. Interval enlargement of a necrotic appearing right external iliac lymph node, consistent with worsened nodal metastatic disease. 3. Multiple new small peritoneal nodules, consistent with peritoneal metastatic disease. 4. Trace ascites in the low pelvis, presumed malignant. 5. Interval vertebral cement augmentation of a pathologic fracture of L3. 6. Status post hysterectomy.   10/27/2022 Imaging   Bone scan  1. 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 2. No additional foci of abnormal increased uptake suggestive of metastatic disease.   11/05/2022 Echocardiogram   1. Left ventricular ejection fraction, by estimation, is 60 to 65%. The left ventricle has normal function. The left ventricle has no regional wall motion abnormalities. Left ventricular diastolic parameters were normal. The average left ventricular global longitudinal strain is -15.0 %. The global longitudinal strain is abnormal.  2. Right ventricular systolic function is normal. The  right ventricular size is normal. Tricuspid regurgitation signal is inadequate for assessing PA pressure.  3. No evidence of mitral valve regurgitation.  4. The aortic valve is grossly normal. Aortic valve regurgitation is not visualized.  5. The inferior vena cava is normal in size with greater than 50% respiratory variability, suggesting right atrial pressure of 3 mmHg.   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 Multiple small bilateral pulmonary nodules, some of which are slightly increased in size. Other nodules unchanged. 2. Interval decrease in size of left pelvic sidewall lymph nodes. 3. Unchanged size of perirectal lymph nodes or soft tissue nodules. These however demonstrate new internal hypodensity, suggesting treatment response and internal necrosis.  4. Unchanged left iliac lymph node or peritoneal nodule. 5. Findings are consistent with mixed response to treatment. No evidence of new metastatic disease in the chest, abdomen, or pelvis. 6. Wall thickening and mucosal hyperenhancement of the bladder, consistent with nonspecific infectious or inflammatory cystitis. Correlate with urinalysis. 7. Status post hysterectomy and oophorectomy. Interval resolution of a previously noted left pelvic hematoma or seroma. 8. Trace, nonspecific free fluid in the low pelvis.   12/07/2021 - 05/31/2022 Chemotherapy   Patient is on Treatment Plan : UTERINE UNDIFFERENTIATED /  LEIOMYOSARCOMA Gemcitabine D1,8 + Docetaxel D8 (900/100) q21d     11/24/2022 -  Chemotherapy   Patient is on Treatment Plan : UTERINE UNDIFFERENTIATED LEIOMYOSARCOMA Gemcitabine D1,8 + Docetaxel D8 (900/100) q21d       PHYSICAL EXAMINATION: ECOG PERFORMANCE STATUS: 1 - Symptomatic but completely ambulatory  Vitals:   12/21/22 0911  BP: 120/71  Pulse: 88  Resp: 18  Temp: 97.8 F (36.6 C)  SpO2: 100%   Filed Weights   12/21/22 0911  Weight: 128 lb 6.4 oz (58.2 kg)    GENERAL:alert, no distress and comfortable  NEURO: alert & oriented x 3 with fluent speech, no focal motor/sensory deficits  LABORATORY DATA:  I have reviewed the data as listed    Component Value Date/Time   NA 142 12/21/2022 0844   K 3.7 12/21/2022 0844   CL 107 12/21/2022 0844   CO2 27 12/21/2022 0844   GLUCOSE 137 (H) 12/21/2022 0844   BUN 14 12/21/2022 0844   CREATININE 0.70 12/21/2022 0844   CALCIUM 8.9 12/21/2022 0844   PROT 6.3 (L) 12/21/2022 0844   ALBUMIN 3.9 12/21/2022 0844   AST 15 12/21/2022 0844   ALT 16 12/21/2022 0844   ALKPHOS 79 12/21/2022 0844   BILITOT 0.4 12/21/2022 0844   GFRNONAA >60 12/21/2022 0844    No results found for: "SPEP", "UPEP"  Lab Results  Component Value Date   WBC 6.0 12/21/2022   NEUTROABS 5.0 12/21/2022   HGB 11.7 (L) 12/21/2022   HCT 35.1 (L) 12/21/2022   MCV 93.1 12/21/2022   PLT 168 12/21/2022      Chemistry      Component Value Date/Time   NA 142 12/21/2022 0844   K 3.7 12/21/2022 0844   CL 107 12/21/2022 0844   CO2 27 12/21/2022 0844   BUN 14 12/21/2022 0844   CREATININE 0.70 12/21/2022 0844      Component Value Date/Time   CALCIUM 8.9 12/21/2022 0844   ALKPHOS 79 12/21/2022 0844   AST 15 12/21/2022 0844   ALT 16 12/21/2022 0844   BILITOT 0.4 12/21/2022 0844

## 2022-12-21 NOTE — Assessment & Plan Note (Signed)
She is somewhat fatigued We will proceed with treatment without delay 

## 2022-12-21 NOTE — Patient Instructions (Signed)
Tammie Gilmore  Discharge Instructions: Thank you for choosing [facility] to provide your oncology and hematology care.   If you have a lab appointment with the [facility], please go directly to the [facility] and check in at the registration area.   Wear comfortable clothing and clothing appropriate for easy access to any Portacath or PICC line.   We strive to give you quality time with your provider. You may need to reschedule your appointment if you arrive late (15 or more minutes).  Arriving late affects you and other patients whose appointments are after yours.  Also, if you miss three or more appointments without notifying the office, you may be dismissed from the clinic at the provider's discretion.      For prescription refill requests, have your pharmacy contact our office and allow 72 hours for refills to be completed.    Today you received the following chemotherapy and/or immunotherapy agents: Keytruda (pembrolizumab), Gemzar      To help prevent nausea and vomiting after your treatment, we encourage you to take your nausea medication as directed.  BELOW ARE SYMPTOMS THAT SHOULD BE REPORTED IMMEDIATELY: *FEVER GREATER THAN 100.4 F (38 C) OR HIGHER *CHILLS OR SWEATING *NAUSEA AND VOMITING THAT IS NOT CONTROLLED WITH YOUR NAUSEA MEDICATION *UNUSUAL SHORTNESS OF BREATH *UNUSUAL BRUISING OR BLEEDING *URINARY PROBLEMS (pain or burning when urinating, or frequent urination) *BOWEL PROBLEMS (unusual diarrhea, constipation, pain near the anus) TENDERNESS IN MOUTH AND THROAT WITH OR WITHOUT PRESENCE OF ULCERS (sore throat, sores in mouth, or a toothache) UNUSUAL RASH, SWELLING OR PAIN  UNUSUAL VAGINAL DISCHARGE OR ITCHING   Items with * indicate a potential emergency and should be followed up as soon as possible or go to the Emergency Department if any problems should occur.  Please show the CHEMOTHERAPY ALERT CARD or  IMMUNOTHERAPY ALERT CARD at check-in to the Emergency Department and triage nurse.  Should you have questions after your visit or need to cancel or reschedule your appointment, please contact [facility]  Dept: [phone]  and follow the prompts.  Office hours are 8:00 a.m. to 4:30 p.m. Monday - Friday. Please note that voicemails left after 4:00 p.m. may not be returned until the following business day.  We are closed weekends and major holidays. You have access to a nurse at all times for urgent questions. Please call the main number to the clinic Dept: [phone] and follow the prompts.   For any non-urgent questions, you may also contact your provider using MyChart. We now offer e-Visits for anyone 1 and older to request care online for non-urgent symptoms. For details visit [email or web address].   Also download the MyChart app! Go to the app store, search "MyChart", open the app, select [facility], and log in with your MyChart username and password.  Pembrolizumab Injection What is this medication? PEMBROLIZUMAB (PEM broe LIZ ue mab) treats some types of cancer. It works by helping your immune system slow or stop the spread of cancer cells. It is a monoclonal antibody. This medicine may be used for other purposes; ask your health care provider or pharmacist if you have questions. COMMON BRAND NAME(S): Keytruda What should I tell my care team before I take this medication? They need to know if you have any of these conditions: Allogeneic stem cell transplant (uses someone else's stem cells) Autoimmune diseases, such as Crohn disease, ulcerative colitis, lupus History of chest  radiation Nervous system problems, such as Guillain-Barre syndrome, myasthenia gravis Organ transplant An unusual or allergic reaction to pembrolizumab, other medications, foods, dyes, or preservatives Pregnant or trying to get pregnant Breast-feeding How should I use  this medication? This medication is injected into a vein. It is given by your care team in a hospital or clinic setting. A special MedGuide will be given to you before each treatment. Be sure to read this information carefully each time. Talk to your care team about the use of this medication in children. While it may be prescribed for children as young as 6 months for selected conditions, precautions do apply. Overdosage: If you think you have taken too much of this medicine contact a poison control center or emergency room at once. NOTE: This medicine is only for you. Do not share this medicine with others. What if I miss a dose? Keep appointments for follow-up doses. It is important not to miss your dose. Call your care team if you are unable to keep an appointment. What may interact with this medication? Interactions have not been studied. This list may not describe all possible interactions. Give your health care provider a list of all the medicines, herbs, non-prescription drugs, or dietary supplements you use. Also tell them if you smoke, drink alcohol, or use illegal drugs. Some items may interact with your medicine. What should I watch for while using this medication? Your condition will be monitored carefully while you are receiving this medication. You may need blood work while taking this medication. This medication may cause serious skin reactions. They can happen weeks to months after starting the medication. Contact your care team right away if you notice fevers or flu-like symptoms with a rash. The rash may be red or purple and then turn into blisters or peeling of the skin. You may also notice a red rash with swelling of the face, lips, or lymph nodes in your neck or under your arms. Tell your care team right away if you have any change in your eyesight. Talk to your care team if you may be pregnant. Serious birth defects can occur if you take this medication during pregnancy and for  4 months after the last dose. You will need a negative pregnancy test before starting this medication. Contraception is recommended while taking this medication and for 4 months after the last dose. Your care team can help you find the option that works for you. Do not breastfeed while taking this medication and for 4 months after the last dose. What side effects may I notice from receiving this medication? Side effects that you should report to your care team as soon as possible: Allergic reactions--skin rash, itching, hives, swelling of the face, lips, tongue, or throat Dry cough, shortness of breath or trouble breathing Eye pain, redness, irritation, or discharge with blurry or decreased vision Heart muscle inflammation--unusual weakness or fatigue, shortness of breath, chest pain, fast or irregular heartbeat, dizziness, swelling of the ankles, feet, or hands Hormone gland problems--headache, sensitivity to light, unusual weakness or fatigue, dizziness, fast or irregular heartbeat, increased sensitivity to cold or heat, excessive sweating, constipation, hair loss, increased thirst or amount of urine, tremors or shaking, irritability Infusion reactions--chest pain, shortness of breath or trouble breathing, feeling faint or lightheaded Kidney injury (glomerulonephritis)--decrease in the amount of urine, red or dark brown urine, foamy or bubbly urine, swelling of the ankles, hands, or feet Liver injury--right upper belly pain, loss of appetite, nausea, light-colored stool, dark  yellow or brown urine, yellowing skin or eyes, unusual weakness or fatigue Pain, tingling, or numbness in the hands or feet, muscle weakness, change in vision, confusion or trouble speaking, loss of balance or coordination, trouble walking, seizures Rash, fever, and swollen lymph nodes Redness, blistering, peeling, or loosening of the skin, including inside the mouth Sudden or severe stomach pain, bloody diarrhea, fever,  nausea, vomiting Side effects that usually do not require medical attention (report to your care team if they continue or are bothersome): Bone, joint, or muscle pain Diarrhea Fatigue Loss of appetite Nausea Skin rash This list may not describe all possible side effects. Call your doctor for medical advice about side effects. You may report side effects to FDA at 1-800-FDA-1088. Where should I keep my medication? This medication is given in a hospital or clinic. It will not be stored at home. NOTE: This sheet is a summary. It may not cover all possible information. If you have questions about this medicine, talk to your doctor, pharmacist, or health care provider.  2023 Elsevier/Gold Standard (2022-02-09 00:00:00)

## 2022-12-21 NOTE — Assessment & Plan Note (Signed)
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

## 2022-12-22 ENCOUNTER — Telehealth: Payer: Self-pay

## 2022-12-22 NOTE — Telephone Encounter (Signed)
-----   Message from Alvera Singh, RN sent at 12/21/2022  1:44 PM EDT ----- 12/21/22- first time Keytruda. Tolerated well. Dr. Alvy Bimler

## 2022-12-22 NOTE — Telephone Encounter (Signed)
Tammie Gilmore states that she is fine. She is eating, drinking, and urinating well. She knows to call the office at [phone] if she has any questions or concerns.

## 2022-12-23 ENCOUNTER — Encounter: Payer: Self-pay | Admitting: Hematology and Oncology

## 2023-01-03 MED FILL — Dexamethasone Sodium Phosphate Inj 100 MG/10ML: INTRAMUSCULAR | Qty: 1 | Status: AC

## 2023-01-04 ENCOUNTER — Inpatient Hospital Stay: Payer: BC Managed Care – PPO

## 2023-01-04 ENCOUNTER — Other Ambulatory Visit: Payer: Self-pay

## 2023-01-04 VITALS — BP 112/72 | HR 72 | Temp 98.2°F | Resp 18 | Wt 128.2 lb

## 2023-01-04 DIAGNOSIS — C55 Malignant neoplasm of uterus, part unspecified: Secondary | ICD-10-CM

## 2023-01-04 DIAGNOSIS — C7800 Secondary malignant neoplasm of unspecified lung: Secondary | ICD-10-CM

## 2023-01-04 LAB — CMP (CANCER CENTER ONLY)
ALT: 15 U/L (ref 0–44)
AST: 14 U/L — ABNORMAL LOW (ref 15–41)
Albumin: 3.9 g/dL (ref 3.5–5.0)
Alkaline Phosphatase: 73 U/L (ref 38–126)
Anion gap: 6 (ref 5–15)
BUN: 14 mg/dL (ref 6–20)
CO2: 27 mmol/L (ref 22–32)
Calcium: 9 mg/dL (ref 8.9–10.3)
Chloride: 109 mmol/L (ref 98–111)
Creatinine: 0.69 mg/dL (ref 0.44–1.00)
GFR, Estimated: >60 mL/min (ref >60)
Glucose, Bld: 120 mg/dL — ABNORMAL HIGH (ref 70–99)
Potassium: 4 mmol/L (ref 3.5–5.1)
Sodium: 142 mmol/L (ref 135–145)
Total Bilirubin: 0.3 mg/dL (ref 0.3–1.2)
Total Protein: 6.1 g/dL — ABNORMAL LOW (ref 6.5–8.1)

## 2023-01-04 LAB — CBC WITH DIFFERENTIAL (CANCER CENTER ONLY)
Abs Immature Granulocytes: 0.01 10*3/uL (ref 0.00–0.07)
Basophils Absolute: 0 10*3/uL (ref 0.0–0.1)
Basophils Relative: 1 %
Eosinophils Absolute: 0.1 10*3/uL (ref 0.0–0.5)
Eosinophils Relative: 3 %
HCT: 33.9 % — ABNORMAL LOW (ref 36.0–46.0)
Hemoglobin: 11.7 g/dL — ABNORMAL LOW (ref 12.0–15.0)
Immature Granulocytes: 0 %
Lymphocytes Relative: 15 %
Lymphs Abs: 0.6 10*3/uL — ABNORMAL LOW (ref 0.7–4.0)
MCH: 32.9 pg (ref 26.0–34.0)
MCHC: 34.5 g/dL (ref 30.0–36.0)
MCV: 95.2 fL (ref 80.0–100.0)
Monocytes Absolute: 0.4 10*3/uL (ref 0.1–1.0)
Monocytes Relative: 10 %
Neutro Abs: 2.8 10*3/uL (ref 1.7–7.7)
Neutrophils Relative %: 71 %
Platelet Count: 271 10*3/uL (ref 150–400)
RBC: 3.56 MIL/uL — ABNORMAL LOW (ref 3.87–5.11)
RDW: 17.6 % — ABNORMAL HIGH (ref 11.5–15.5)
WBC Count: 3.9 10*3/uL — ABNORMAL LOW (ref 4.0–10.5)
nRBC: 0 % (ref 0.0–0.2)

## 2023-01-04 MED ORDER — SODIUM CHLORIDE 0.9 % IV SOLN
720.0000 mg/m2 | Freq: Once | INTRAVENOUS | Status: AC
  Administered 2023-01-04: 1178 mg via INTRAVENOUS
  Filled 2023-01-04: qty 30.98

## 2023-01-04 MED ORDER — SODIUM CHLORIDE 0.9 % IV SOLN
10.0000 mg | Freq: Once | INTRAVENOUS | Status: AC
  Administered 2023-01-04: 10 mg via INTRAVENOUS
  Filled 2023-01-04: qty 10

## 2023-01-04 MED ORDER — PROCHLORPERAZINE MALEATE 10 MG PO TABS
10.0000 mg | ORAL_TABLET | Freq: Once | ORAL | Status: AC
  Administered 2023-01-04: 10 mg via ORAL
  Filled 2023-01-04: qty 1

## 2023-01-04 MED ORDER — SODIUM CHLORIDE 0.9% FLUSH: 10.0000 mL | INTRAVENOUS | Status: DC | PRN

## 2023-01-04 MED ORDER — SODIUM CHLORIDE 0.9 % IV SOLN: Freq: Once | INTRAVENOUS | Status: AC

## 2023-01-04 MED ORDER — HEPARIN SOD (PORK) LOCK FLUSH 100 UNIT/ML IV SOLN: 500.0000 [IU] | Freq: Once | INTRAVENOUS | Status: DC | PRN

## 2023-01-04 MED ORDER — SODIUM CHLORIDE 0.9 % IV SOLN
80.0000 mg/m2 | Freq: Once | INTRAVENOUS | Status: AC
  Administered 2023-01-04: 133 mg via INTRAVENOUS
  Filled 2023-01-04: qty 13.3

## 2023-01-04 MED ORDER — SODIUM CHLORIDE 0.9 % IV SOLN: Freq: Once | INTRAVENOUS | Status: DC

## 2023-01-04 MED ORDER — FAMOTIDINE IN NACL 20-0.9 MG/50ML-% IV SOLN
20.0000 mg | Freq: Once | INTRAVENOUS | Status: AC
  Administered 2023-01-04: 20 mg via INTRAVENOUS
  Filled 2023-01-04: qty 50

## 2023-01-04 MED ORDER — SODIUM CHLORIDE 0.9% FLUSH
10.0000 mL | Freq: Once | INTRAVENOUS | Status: AC
  Administered 2023-01-04: 10 mL

## 2023-01-04 NOTE — Patient Instructions (Signed)
[facility]  Discharge Instructions: Thank you for choosing [facility] to provide your oncology and hematology care.   If you have a lab appointment with the Cancer Center, please go directly to the Cancer Center and check in at the registration area.   Wear comfortable clothing and clothing appropriate for easy access to any Portacath or PICC line.   We strive to give you quality time with your provider. You may need to reschedule your appointment if you arrive late (15 or more minutes).  Arriving late affects you and other patients whose appointments are after yours.  Also, if you miss three or more appointments without notifying the office, you may be dismissed from the clinic at the provider's discretion.      For prescription refill requests, have your pharmacy contact our office and allow 72 hours for refills to be completed.    Today you received the following chemotherapy and/or immunotherapy agents Gemzar/Taxotere.      To help prevent nausea and vomiting after your treatment, we encourage you to take your nausea medication as directed.  BELOW ARE SYMPTOMS THAT SHOULD BE REPORTED IMMEDIATELY: *FEVER GREATER THAN 100.4 F (38 C) OR HIGHER *CHILLS OR SWEATING *NAUSEA AND VOMITING THAT IS NOT CONTROLLED WITH YOUR NAUSEA MEDICATION *UNUSUAL SHORTNESS OF BREATH *UNUSUAL BRUISING OR BLEEDING *URINARY PROBLEMS (pain or burning when urinating, or frequent urination) *BOWEL PROBLEMS (unusual diarrhea, constipation, pain near the anus) TENDERNESS IN MOUTH AND THROAT WITH OR WITHOUT PRESENCE OF ULCERS (sore throat, sores in mouth, or a toothache) UNUSUAL RASH, SWELLING OR PAIN  UNUSUAL VAGINAL DISCHARGE OR ITCHING   Items with * indicate a potential emergency and should be followed up as soon as possible or go to the Emergency Department if any problems should occur.  Please show the CHEMOTHERAPY ALERT CARD or IMMUNOTHERAPY ALERT CARD at  check-in to the Emergency Department and triage nurse.  Should you have questions after your visit or need to cancel or reschedule your appointment, please contact [facility]  Dept: [phone]  and follow the prompts.  Office hours are 8:00 a.m. to 4:30 p.m. Monday - Friday. Please note that voicemails left after 4:00 p.m. may not be returned until the following business day.  We are closed weekends and major holidays. You have access to a nurse at all times for urgent questions. Please call the main number to the clinic Dept: [phone] and follow the prompts.   For any non-urgent questions, you may also contact your provider using MyChart. We now offer e-Visits for anyone 18 and older to request care online for non-urgent symptoms. For details visit [email or web address].   Also download the MyChart app! Go to the app store, search "MyChart", open the app, select [facility], and log in with your MyChart username and password.   

## 2023-01-06 ENCOUNTER — Inpatient Hospital Stay: Payer: BC Managed Care – PPO

## 2023-01-06 ENCOUNTER — Other Ambulatory Visit: Payer: Self-pay

## 2023-01-06 VITALS — BP 133/74 | HR 72 | Temp 98.6°F | Resp 16

## 2023-01-06 DIAGNOSIS — C7800 Secondary malignant neoplasm of unspecified lung: Secondary | ICD-10-CM

## 2023-01-06 DIAGNOSIS — C55 Malignant neoplasm of uterus, part unspecified: Secondary | ICD-10-CM

## 2023-01-06 MED ORDER — PEGFILGRASTIM-CBQV 6 MG/0.6ML ~~LOC~~ SOSY
6.0000 mg | PREFILLED_SYRINGE | Freq: Once | SUBCUTANEOUS | Status: AC
  Administered 2023-01-06: 6 mg via SUBCUTANEOUS
  Filled 2023-01-06: qty 0.6

## 2023-01-06 NOTE — Patient Instructions (Signed)

## 2023-01-18 ENCOUNTER — Inpatient Hospital Stay: Payer: BC Managed Care – PPO | Admitting: Hematology and Oncology

## 2023-01-18 ENCOUNTER — Other Ambulatory Visit: Payer: Self-pay

## 2023-01-18 ENCOUNTER — Encounter: Payer: Self-pay | Admitting: Hematology and Oncology

## 2023-01-18 ENCOUNTER — Inpatient Hospital Stay: Payer: BC Managed Care – PPO

## 2023-01-18 ENCOUNTER — Inpatient Hospital Stay: Payer: BC Managed Care – PPO | Attending: Gynecologic Oncology

## 2023-01-18 VITALS — BP 121/70 | HR 99 | Temp 98.4°F | Resp 18 | Ht 68.0 in | Wt 133.0 lb

## 2023-01-18 DIAGNOSIS — C7800 Secondary malignant neoplasm of unspecified lung: Secondary | ICD-10-CM

## 2023-01-18 DIAGNOSIS — C775 Secondary and unspecified malignant neoplasm of intrapelvic lymph nodes: Secondary | ICD-10-CM | POA: Insufficient documentation

## 2023-01-18 DIAGNOSIS — T451X5A Adverse effect of antineoplastic and immunosuppressive drugs, initial encounter: Secondary | ICD-10-CM | POA: Diagnosis not present

## 2023-01-18 DIAGNOSIS — Z5111 Encounter for antineoplastic chemotherapy: Secondary | ICD-10-CM | POA: Insufficient documentation

## 2023-01-18 DIAGNOSIS — Z5112 Encounter for antineoplastic immunotherapy: Secondary | ICD-10-CM | POA: Insufficient documentation

## 2023-01-18 DIAGNOSIS — I700 Atherosclerosis of aorta: Secondary | ICD-10-CM | POA: Insufficient documentation

## 2023-01-18 DIAGNOSIS — D6481 Anemia due to antineoplastic chemotherapy: Secondary | ICD-10-CM

## 2023-01-18 DIAGNOSIS — C7951 Secondary malignant neoplasm of bone: Secondary | ICD-10-CM | POA: Diagnosis not present

## 2023-01-18 DIAGNOSIS — J90 Pleural effusion, not elsewhere classified: Secondary | ICD-10-CM | POA: Insufficient documentation

## 2023-01-18 DIAGNOSIS — C55 Malignant neoplasm of uterus, part unspecified: Secondary | ICD-10-CM

## 2023-01-18 DIAGNOSIS — C7931 Secondary malignant neoplasm of brain: Secondary | ICD-10-CM | POA: Diagnosis not present

## 2023-01-18 DIAGNOSIS — Z7952 Long term (current) use of systemic steroids: Secondary | ICD-10-CM | POA: Insufficient documentation

## 2023-01-18 DIAGNOSIS — R609 Edema, unspecified: Secondary | ICD-10-CM | POA: Diagnosis not present

## 2023-01-18 LAB — CMP (CANCER CENTER ONLY)
ALT: 9 U/L (ref 0–44)
AST: 12 U/L — ABNORMAL LOW (ref 15–41)
Albumin: 3.7 g/dL (ref 3.5–5.0)
Alkaline Phosphatase: 82 U/L (ref 38–126)
Anion gap: 5 (ref 5–15)
BUN: 11 mg/dL (ref 6–20)
CO2: 29 mmol/L (ref 22–32)
Calcium: 8.9 mg/dL (ref 8.9–10.3)
Chloride: 108 mmol/L (ref 98–111)
Creatinine: 0.64 mg/dL (ref 0.44–1.00)
GFR, Estimated: >60 mL/min (ref >60)
Glucose, Bld: 118 mg/dL — ABNORMAL HIGH (ref 70–99)
Potassium: 3.6 mmol/L (ref 3.5–5.1)
Sodium: 142 mmol/L (ref 135–145)
Total Bilirubin: 0.3 mg/dL (ref 0.3–1.2)
Total Protein: 6 g/dL — ABNORMAL LOW (ref 6.5–8.1)

## 2023-01-18 LAB — CBC WITH DIFFERENTIAL (CANCER CENTER ONLY)
Abs Immature Granulocytes: 0.08 10*3/uL — ABNORMAL HIGH (ref 0.00–0.07)
Basophils Absolute: 0.1 10*3/uL (ref 0.0–0.1)
Basophils Relative: 1 %
Eosinophils Absolute: 0.1 10*3/uL (ref 0.0–0.5)
Eosinophils Relative: 1 %
HCT: 32.6 % — ABNORMAL LOW (ref 36.0–46.0)
Hemoglobin: 11.1 g/dL — ABNORMAL LOW (ref 12.0–15.0)
Immature Granulocytes: 1 %
Lymphocytes Relative: 9 %
Lymphs Abs: 0.7 10*3/uL (ref 0.7–4.0)
MCH: 33.1 pg (ref 26.0–34.0)
MCHC: 34 g/dL (ref 30.0–36.0)
MCV: 97.3 fL (ref 80.0–100.0)
Monocytes Absolute: 0.4 10*3/uL (ref 0.1–1.0)
Monocytes Relative: 6 %
Neutro Abs: 6.2 10*3/uL (ref 1.7–7.7)
Neutrophils Relative %: 82 %
Platelet Count: 188 10*3/uL (ref 150–400)
RBC: 3.35 MIL/uL — ABNORMAL LOW (ref 3.87–5.11)
RDW: 16.9 % — ABNORMAL HIGH (ref 11.5–15.5)
WBC Count: 7.5 10*3/uL (ref 4.0–10.5)
nRBC: 0 % (ref 0.0–0.2)

## 2023-01-18 MED ORDER — SODIUM CHLORIDE 0.9% FLUSH
10.0000 mL | INTRAVENOUS | Status: DC | PRN
  Administered 2023-01-18: 10 mL

## 2023-01-18 MED ORDER — SODIUM CHLORIDE 0.9 % IV SOLN
200.0000 mg | Freq: Once | INTRAVENOUS | Status: AC
  Administered 2023-01-18: 200 mg via INTRAVENOUS
  Filled 2023-01-18: qty 200

## 2023-01-18 MED ORDER — HEPARIN SOD (PORK) LOCK FLUSH 100 UNIT/ML IV SOLN
500.0000 [IU] | Freq: Once | INTRAVENOUS | Status: AC | PRN
  Administered 2023-01-18: 500 [IU]

## 2023-01-18 MED ORDER — SODIUM CHLORIDE 0.9 % IV SOLN
720.0000 mg/m2 | Freq: Once | INTRAVENOUS | Status: AC
  Administered 2023-01-18: 1178 mg via INTRAVENOUS
  Filled 2023-01-18: qty 30.98

## 2023-01-18 MED ORDER — PROCHLORPERAZINE MALEATE 10 MG PO TABS
10.0000 mg | ORAL_TABLET | Freq: Once | ORAL | Status: AC
  Administered 2023-01-18: 10 mg via ORAL
  Filled 2023-01-18: qty 1

## 2023-01-18 MED ORDER — SODIUM CHLORIDE 0.9 % IV SOLN: Freq: Once | INTRAVENOUS | Status: AC

## 2023-01-18 NOTE — Assessment & Plan Note (Signed)
She is not symptomatic We will proceed with treatment without delay

## 2023-01-18 NOTE — Assessment & Plan Note (Signed)
She tolerated the addition of pembrolizumab well She has gained some weight We will monitor her thyroid function closely I plan to repeat imaging study after 3 cycles of chemotherapy next month

## 2023-01-18 NOTE — Progress Notes (Signed)
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    Answer:   [facility]    Order Specific Question:   Radiology Contrast Protocol - do NOT remove file path    Answer:   [email or web address]    Order Specific Question:   Is patient pregnant?    Answer:   No    All questions were answered. The patient knows to call the clinic with any problems, questions or concerns. The total time spent in the appointment was 25 minutes encounter with patients including review of chart and various tests results, discussions about plan of care and coordination of care plan   Artis Delay, MD 01/18/2023 11:53 AM  INTERVAL HISTORY: Please see below for problem oriented charting. she returns for treatment follow-up with her husband She is doing well She is able to do housework without pushing too much Denies peripheral neuropathy She has gained some weight  REVIEW OF SYSTEMS:   Constitutional: Denies fevers, chills or abnormal weight loss Eyes: Denies blurriness of vision Ears, nose, mouth, throat, and face: Denies mucositis or sore throat Respiratory: Denies cough, dyspnea or wheezes Cardiovascular: Denies palpitation, chest discomfort or lower extremity swelling Gastrointestinal:  Denies nausea, heartburn or change in  bowel habits Skin: Denies abnormal skin rashes Lymphatics: Denies new lymphadenopathy or easy bruising Neurological:Denies numbness, tingling or new weaknesses Behavioral/Psych: Mood is stable, no new changes  All other systems were reviewed with the patient and are negative.  I have reviewed the past medical history, past surgical history, social history and family history with the patient and they are unchanged from previous note.  ALLERGIES:  is allergic to doxycycline.  MEDICATIONS:  Current Outpatient Medications  Medication Sig Dispense Refill   acetaminophen (TYLENOL) 500 MG tablet Take 1,000 mg by mouth every 6 (six) hours as needed for moderate pain or headache.     calcium carbonate (TUMS - DOSED IN MG ELEMENTAL CALCIUM) 500 MG chewable tablet Chew 1 tablet by mouth 2 (two) times daily.     cholecalciferol (VITAMIN D3) 25 MCG (1000 UNIT) tablet Take 2,000 Units by mouth daily.     dexamethasone (DECADRON) 4 MG tablet Take 0.5 tablets (2 mg total) by mouth daily.     estradiol (ESTRACE) 0.1 MG/GM vaginal cream PLACE FINGER TIP SIZE AMOUNT OF CREAM AND INSERT SLIGHTLY PAST THE VAGINAL ENTRANCE 3 TIMES DAILY 126 g 4   lidocaine (XYLOCAINE) 2 % solution SMARTSIG:By Mouth     lidocaine-prilocaine (EMLA) cream Apply to affected area once 30 g 3   loratadine (CLARITIN) 10 MG tablet Take 10 mg by mouth daily as needed (for bone aches).     LORazepam (ATIVAN) 0.5 MG tablet Take 1 tablet (0.5 mg total) by mouth 2 (two) times daily as needed for anxiety. 30 tablet 0   magic mouthwash (nystatin, diphenhydrAMINE, alum &  mag hydroxide) suspension mixture Swish and spit 5 mLs 4 (four) times daily as needed for mouth pain. 240 mL 0   metoprolol succinate (TOPROL XL) 25 MG 24 hr tablet Take 1 tablet (25 mg total) by mouth at bedtime. 30 tablet 6   ondansetron (ZOFRAN) 8 MG tablet Take 1 tablet (8 mg total) by mouth every 8 (eight) hours as needed. 30 tablet 1   prochlorperazine (COMPAZINE) 10 MG  tablet Take 1 tablet (10 mg total) by mouth every 6 (six) hours as needed (Nausea or vomiting). 90 tablet 1   senna (SENOKOT) 8.6 MG TABS tablet Take 2 tablets by mouth at bedtime.     No current facility-administered medications for this visit.   Facility-Administered Medications Ordered in Other Visits  Medication Dose Route Frequency Provider Last Rate Last Admin   gemcitabine (GEMZAR) 1,178 mg in sodium chloride 0.9 % 250 mL chemo infusion  720 mg/m2 (Treatment Plan Recorded) Intravenous Once Bertis Ruddy, Kiren Mcisaac, MD       heparin lock flush 100 unit/mL  500 Units Intracatheter Once PRN Bertis Ruddy, Quinnie Barcelo, MD       pembrolizumab (KEYTRUDA) 200 mg in sodium chloride 0.9 % 50 mL chemo infusion  200 mg Intravenous Once Bertis Ruddy, Jasa Dundon, MD 116 mL/hr at 01/18/23 1146 200 mg at 01/18/23 1146   sodium chloride flush (NS) 0.9 % injection 10 mL  10 mL Intracatheter PRN Artis Delay, MD        SUMMARY OF ONCOLOGIC HISTORY: Oncology History  Uterine leiomyosarcoma  06/11/2021 Imaging   1. 9.5 x 7.6 x 9.0 cm complex, partially necrotic, mass involving the lower uterine segment/ cervix. No obvious direct extension into the parametrium.  2. 9 mm left pelvic sidewall lymph node is partially necrotic and worrisome for metastatic adenopathy.  3. No findings for abdominal omental or peritoneal surface disease or adenopathy.  4. Tiny low-attenuation lesion in the pancreatic head, likely benign cyst but attention on follow-up scans is suggested.  5. 2.9 cm fundal fibroid.    06/19/2021 Pathology Results   FINAL MICROSCOPIC DIAGNOSIS:   A. UTERINE, CERVICAL MASS, BIOPSY:  - Spindle cell malignancy.  - See comment.   COMMENT:  The biopsies consist of endocervical mucosa with stromal edema and one biopsy fragment has a microscopic focus with atypical spindle cells consistent with poorly differentiated malignancy.  The differential  includes a spindle cell malignancy such as sarcomatoid carcinoma and leiomyosarcoma.  Mullerian  adenosarcoma is also a consideration but considered less likely   06/23/2021 Imaging   MR pelvis  10 cm uterine mass with central necrosis, which is centered in the cervix and lower uterine segment. Right parametrial involvement is seen as well as suspected invasion of the distal rectum. Differential diagnosis includes cervical carcinoma and uterine leiomyosarcoma.   Mild bilateral iliac lymphadenopathy, highly suspicious for metastatic disease.   2.9 cm subserosal fibroid in the posterior fundus.   Normal appearance of both ovaries.     06/29/2021 PET scan   1. Hypermetabolic necrotic cervical/uterine mass with bilateral external iliac hypermetabolic lymph nodes. No evidence of distant metastatic disease. 2. 1.5 cm low-attenuation left thyroid nodule. Recommend thyroid ultrasound. (Ref: J Am Coll Radiol. 2015 Feb;12(2): 143-50).   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, pleomorphic, and myxoid areas and associated necrosis (~20%) - Tumor based in cervix and also involves lower uterine segment -  Cervicovaginal margin involved by focal invasive leiomyosarcoma (3:00-5:00, A10) as well as tumor in lymphovascular spaces - Leiomyosarcoma involves right and left parametrial tissue and extends to parametrial margins - Extensive lymphovascular space invasion present, including in uterus and parametria - See synoptic report and comment   Other findings: - Leiomyomata with hyalinization, size up to 3.0 cm - Ovaries and fallopian tubes with no parenchymal involvement by leiomyosarcoma identified, although adnexal lymphovascular space invasion is present   B: Lymph nodes, right pelvic, lymphadenectomy - One of four lymph nodes positive for metastatic leiomyosarcoma (1/4), with extracapsular extension present   C:  Lymph nodes, left pelvic, lymphadenectomy - One of four lymph nodes positive for metastatic leiomyosarcoma (1/4), with extracapsular extension present  Immunohistochemical stains are performed on block A11, and demonstrate that the tumor is positive for desmin and CD10, with SMA staining the majority of the spindle cell component but largely negative in the epithelioid / pleomorphic component. OSCAR, pancytokeratin AE1/AE3, HMB45, and PR appear negative in the tumor. ER shows patchy weak staining and myogenin stains rare cells. Block A23 also shows positive desmin and negative OSCAR pancytokeratin. Overall, the findings are most consistent with leiomyosarcoma, with extensive areas that are myxoid, epithelioid, and pleomorphic as well as more typical spindle cell areas within the overall high grade tumor (grade 3 / 3). The tumor is staged as pT2b (involves other pelvic tissues) given the parametrial involvement and pN1 for FIGO stage IIIC.    07/17/2021 Surgery   Date of Surgery: 07/17/21  Preoperative Diagnosis: High Grade Uterine Sarcoma  Postoperative Diagnosis: Same  Procedure(s): Bilateral - RADICAL ABDOMINAL HYSTER, W/BIL TOTAL PELVIC LYMPHADENECTOMY & PARA-AORTIC LYMPH NODE BX W/WO REM TUBE/OVAR VAGINAL HYSTERECTOMY, FOR UTERUS 250 G OR LESS; WITH REPAIR OF ENTEROCELE COLECTOMY, PARTIAL; WITH COLOPROCTOSTOMY (LOW PELVIC ANASTOMOSIS) WITH COLOSTOMY CYSTOURETHROSCOPY, WITH INSERTION OF INDWELLING URETERAL STENT (EG, GIBBONS OR DOUBLE-J TYPE) - Cystourethroscopy - Bilateral ureteral stent placement - Foley catheter placement  Performing Service: Gynecology Oncology Surgeon(s) and Role: Panel 1: * Carver Fila, MD - Primary * Clide Cliff, MD - Resident - Assisting * Irene Pap, MD - Resident - Assisting Panel 2: * Annye English, MD - Primary  Drains:  - Left 6Fr open-ended ureteral access catheter (green) - Right 5Fr open-ended ureteral access catheter  (white) - 16Fr foley catheter to drainage  * No implants in log *  Indications: 57 y.o. female with high grade uterine sarcoma. Urology was consulted pre-operatively for placement of bilateral ureteral stents. Risks, benefits, and alternatives of the above procedure were discussed and informed consent was signed.  OperativeFindings:  - Grossly distorted architecture of urinary bladder likely 2/2 pelvic mass with anterolaterally positioned UOs - Successful placement of bilateral open-ended ureteral catheters under direct visualization - Foley catheter placed at case conclusion  Description: The patient was correctly identified in the preop holding area where written informed consent as well potential risk and complication reviewed. She agreed. The patient was brought to the operative suite where a preinduction timeout was performed. Once correct information was verified, general anesthesia was induced. The patient was then gently placed into dorsal lithotomy position with SCDs in place for VTE prophylaxis. They were prepped and draped in the usual sterile fashion and given appropriate preoperative antibiotics. A second timeout was then performed.   We inserted a 81F rigid cystoscope per urethra with copious lubrication and normal saline irrigation running. We performed cystourethroscopy, which revealed the above findings.  We turned our attention to  the left ureteral orifice and canulated it with a sensor wire, using assistance of a 6Fr open-ended catheter. The wire was advanced into the renal pelvis without difficulty under visual guidance. We then advanced the stent over our wire into the renal pelvis under direct visualization and feel without complication. The wire was subsequently removed.   We then turned our attention to the right ureteral orifice and canulated it with a sensor wire, using assistance of a 5Fr open-ended catheter. The wire was advanced into the renal pelvis without difficulty  under visual guidance. We then advanced the stent over our wire into the renal pelvis under direct visualization and feel without complication. The wire was subsequently removed.   A 16Fr straight catheter was placed, with return of urine indicating appropriate position within the bladder. The balloon was inflated with 10cc sterile water. The stents were secured to the Foley using 0-silk ties, being careful not to occlude the stents or Foley.   The patient was awoken from general anesthesia having tolerated the procedure well and taken to the PACU for routine post-operative recovery.  Post-Op Plan:  - Foley and stents per primary team    07/17/2021 Surgery   Date of Surgery: 07/17/2021  Pre-op Diagnosis: Uterine spindle cell malignancy  Post-op Diagnosis: Same  Procedure(s): Panel 1 RADICAL ABDOMINAL HYSTER, with bilateral S&O, vagineconty upper, bilateral pelvic lyphadenectomy, bilateral ureterolysis,: 58210 (CPT) Panel 2 CYSTOURETHROSCOPY, WITH INSERTION OF INDWELLING URETERAL STENT (EG, GIBBONS OR DOUBLE-J TYPE): 52332 (CPT) Note: Revisions to procedures should be made in chart - see Procedures activity.  Performing Service: Gynecology Oncology Surgeon(s) and Role: Panel 1: * Carver Fila, MD - Primary * Clide Cliff, MD - Resident - Assisting * Irene Pap, MD - Resident - Assisting Panel 2: * Annye English, MD - Primary  Findings: On bimanual exam, 10cm necrotic mass filling upper vagina, unable to discretely palpate the cervix. On rectovaginal exam, rectal involvement not identified. Intraoperatively, normal upper abdominal survey including normal liver, diaphragm, stomach, omentum and bowel. Small uterus with 10cm mass expanding the cervix. Palpably enlarged bilateral pelvic lymph nodes adherent to the external iliac veins and obturator nerves, removed. No palpable para-aortic lymphadenopathy. No rectal involvement of uterine mass.   Specimens:  ID  Type Source Tests Collected by Time Destination  1 : uterus,cervix,bilateral tubes/ovaries Tissue Uterus SURGICAL PATHOLOGY EXAM Carver Fila, MD 07/17/2021 0932  2 : right pelvic lymph node Tissue Lymph Node SURGICAL PATHOLOGY EXAM Carver Fila, MD 07/17/2021 1125  3 : LEFT PELVIC LN Tissue Lymph Node SURGICAL PATHOLOGY EXAM Carver Fila, MD 07/17/2021 1144    08/06/2021 Initial Diagnosis   Uterine leiomyosarcoma (HCC)   08/06/2021 Cancer Staging   Staging form: Corpus Uteri - Leiomyosarcoma and Endometrial Stromal Sarcoma, AJCC 8th Edition - Pathologic stage from 08/06/2021: FIGO Stage IVB (pT3, pN1, cM1) - Signed by Artis Delay, MD on 08/11/2021 Stage prefix: Initial diagnosis   08/10/2021 Imaging   CT abdomen and pelvis 1. Interval development of left lobe pulmonary nodules, measuring up to 7 mm and highly for metastatic disease. 2. Interval development of small to upper normal lymph nodes in the pelvis, concerning for metastatic disease. 3. Postoperative seroma left pelvic sidewall. 4. Tiny cluster of tree-in-bud opacity in the peripheral right lower lobe is new and compatible with sequelae of atypical infection.   08/13/2021 Procedure   Procedure: Placement of a right IJ approach single lumen PowerPort.  Tip is positioned at the superior cavoatrial junction  and catheter is ready for immediate use.  Complications: No immediate   08/14/2021 Echocardiogram    1. Left ventricular ejection fraction, by estimation, is 60 to 65%. The left ventricle has normal function. The left ventricle has no regional wall motion abnormalities. Left ventricular diastolic parameters were normal. The average left ventricular global longitudinal strain is -17.4 %. The global longitudinal strain is normal.  2. Right ventricular systolic function is normal. The right ventricular size is normal.  3. The mitral valve is normal in structure. No evidence of mitral valve regurgitation. No evidence of  mitral stenosis.  4. The aortic valve is tricuspid. Aortic valve regurgitation is not visualized. No aortic stenosis is present.  5. The inferior vena cava is normal in size with greater than 50% respiratory variability, suggesting right atrial pressure of 3 mmHg.     08/17/2021 Imaging   Multiple new and enlarging pulmonary nodules scattered throughout the lungs bilaterally, highly concerning for progressive metastatic disease to the lungs   08/18/2021 - 11/10/2021 Chemotherapy   Patient is on Treatment Plan : UTERINE LEIOMYOSARCOMA Doxorubicin q21d x 6 Cycles     08/18/2021 - 08/12/2022 Chemotherapy   Patient is on Treatment Plan : UTERINE UNDIFFERENTIATED LEIOMYOSARCOMA Gemcitabine D1,8 + Docetaxel D8 (900/100) q21d     11/09/2021 Imaging   IMPRESSION: 1. Multiple small bilateral pulmonary nodules, some of which are slightly increased in size. Other nodules unchanged. 2. Interval decrease in size of left pelvic sidewall lymph nodes. 3. Unchanged size of perirectal lymph nodes or soft tissue nodules. These however demonstrate new internal hypodensity, suggesting treatment response and internal necrosis. 4. Unchanged left iliac lymph node or peritoneal nodule. 5. Findings are consistent with mixed response to treatment. No evidence of new metastatic disease in the chest, abdomen, or pelvis. 6. Wall thickening and mucosal hyperenhancement of the bladder, consistent with nonspecific infectious or inflammatory cystitis. Correlate with urinalysis. 7. Status post hysterectomy and oophorectomy. Interval resolution of a previously noted left pelvic hematoma or seroma. 8. Trace, nonspecific free fluid in the low pelvis.   11/30/2021 Echocardiogram    1. Left ventricular ejection fraction, by estimation, is 40 to 45%. Left ventricular ejection fraction by 3D volume is 41 %. The left ventricle has mildly decreased function. The left ventricle has no regional wall motion abnormalities. Left ventricular   diastolic parameters are consistent with Grade I diastolic dysfunction (impaired relaxation).  2. Right ventricular systolic function is moderately reduced. The right ventricular size is normal.  3. The mitral valve is grossly normal. No evidence of mitral valve regurgitation.  4. The aortic valve is normal in structure. Aortic valve regurgitation is not visualized. No aortic stenosis is present.     12/07/2021 - 05/31/2022 Chemotherapy   Patient is on Treatment Plan : UTERINE UNDIFFERENTIATED / LEIOMYOSARCOMA Gemcitabine D1,8 + Docetaxel D8 (900/100) q21d     02/05/2022 Imaging   Pathologic burst fracture of L3 due to a metastatic lesion, with probable mild degree of right-sided extraosseous tumor extension in the paraspinal soft tissues, and mild involvement of the right pedicle. No epidural/spinal canal involvement.   Multilevel degenerative disc disease without any significant stenosis in the lumbar spine.   05/31/2022 Imaging   1. Signs of pelvic resolution of pelvic sidewall nodal disease/soft tissue near the LEFT vaginal apex. 2. Decreased conspicuity of RIGHT lower lobe pulmonary nodule and stable LEFT apical pulmonary nodule. 3. Unchanged appearance of pathologic fracture at L3. 4. Urinary bladder wall thickening, slightly improved posteriorly, anterior urinary bladder  may show some residual diffuse thickening. Continued correlation with signs of cystitis is suggested.     08/24/2022 Imaging   1. Interval growth of a solitary right external iliac nodal metastasis. No additional sites of new or progressive metastatic disease. 2. Small bilateral upper lobe pulmonary nodules are stable to mildly decreased. 3. Chronic incompletely healed pathologic L3 vertebral fracture with underlying lytic metastasis, not appreciably changed. No new focal osseous lesions. 4. New trace dependent bilateral pleural effusions. 5.  Aortic Atherosclerosis (ICD10-I70.0).     08/27/2022 Procedure   1.  Successful L3 Osteocool RFA ablation. 2. Successful L3 kyphoplasty.   10/14/2022 Imaging   Focal soft tissue swelling along the occipital region seen on lateral view may correspond to palpable area of clinical concern. Lytic calvarial lesion underlying this area measures 3.2 cm in craniocaudal dimension, suspicious for osseous metastasis in the setting of known malignancy. Recommend contrast-enhanced MRI or CT for further evaluation.   10/21/2022 Imaging   MRI brain 1. 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 2. 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   10/26/2022 Imaging   1. Multiple new and enlarged bilateral pulmonary nodules, consistent with worsened pulmonary metastatic disease. 2. Interval enlargement of a necrotic appearing right external iliac lymph node, consistent with worsened nodal metastatic disease. 3. Multiple new small peritoneal nodules, consistent with peritoneal metastatic disease. 4. Trace ascites in the low pelvis, presumed malignant. 5. Interval vertebral cement augmentation of a pathologic fracture of L3. 6. Status post hysterectomy.   10/27/2022 Imaging   Bone scan  1. 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 2. No additional foci of abnormal increased uptake suggestive of metastatic disease.   11/05/2022 Echocardiogram   1. Left ventricular ejection fraction, by estimation, is 60 to 65%. The left ventricle has normal function. The left ventricle has no regional wall motion abnormalities. Left ventricular diastolic parameters were normal. The average left ventricular global longitudinal strain is -15.0 %. The global longitudinal strain is abnormal.  2. Right ventricular systolic function is normal. The right ventricular size is normal. Tricuspid regurgitation signal is inadequate for  assessing PA pressure.  3. No evidence of mitral valve regurgitation.  4. The aortic valve is grossly normal. Aortic valve regurgitation is not visualized.  5. The inferior vena cava is normal in size with greater than 50% respiratory variability, suggesting right atrial pressure of 3 mmHg.   11/24/2022 -  Chemotherapy   Patient is on Treatment Plan : UTERINE UNDIFFERENTIATED LEIOMYOSARCOMA Gemcitabine D1,8 + Docetaxel D8 (900/100) q21d     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08/11/2021 Initial Diagnosis   Pulmonary metastases (HCC)   08/18/2021 - 11/10/2021 Chemotherapy   Patient is on Treatment Plan : UTERINE LEIOMYOSARCOMA Doxorubicin q21d x 6 Cycles     08/18/2021 - 08/12/2022 Chemotherapy   Patient is on Treatment Plan : UTERINE UNDIFFERENTIATED LEIOMYOSARCOMA Gemcitabine D1,8 + Docetaxel D8 (900/100) q21d     11/09/2021 Imaging   IMPRESSION: 1. Multiple small bilateral pulmonary nodules, some of which are slightly increased in size. Other nodules unchanged. 2. Interval decrease in size of left pelvic sidewall lymph nodes. 3. Unchanged size of perirectal lymph nodes or soft tissue nodules. These however demonstrate new internal hypodensity, suggesting treatment response and internal necrosis. 4. Unchanged left iliac lymph node or peritoneal nodule. 5. Findings are consistent with mixed response  to treatment. No evidence of new metastatic disease in the chest, abdomen, or pelvis. 6. Wall thickening and mucosal hyperenhancement of the bladder, consistent with nonspecific infectious or inflammatory cystitis. Correlate with urinalysis. 7. Status post hysterectomy and oophorectomy. Interval resolution of a previously noted left pelvic hematoma or seroma. 8. Trace, nonspecific free fluid in the low pelvis.   12/07/2021 - 05/31/2022 Chemotherapy   Patient is on Treatment Plan : UTERINE UNDIFFERENTIATED / LEIOMYOSARCOMA Gemcitabine D1,8 + Docetaxel D8 (900/100) q21d     11/24/2022 -   Chemotherapy   Patient is on Treatment Plan : UTERINE UNDIFFERENTIATED LEIOMYOSARCOMA Gemcitabine D1,8 + Docetaxel D8 (900/100) q21d       PHYSICAL EXAMINATION: ECOG PERFORMANCE STATUS: 1 - Symptomatic but completely ambulatory  Vitals:   01/18/23 1028  BP: 121/70  Pulse: 99  Resp: 18  Temp: 98.4 F (36.9 C)  SpO2: 100%   Filed Weights   01/18/23 1028  Weight: 133 lb (60.3 kg)    GENERAL:alert, no distress and comfortable  NEURO: alert & oriented x 3 with fluent speech, no focal motor/sensory deficits  LABORATORY DATA:  I have reviewed the data as listed    Component Value Date/Time   NA 142 01/18/2023 0959   K 3.6 01/18/2023 0959   CL 108 01/18/2023 0959   CO2 29 01/18/2023 0959   GLUCOSE 118 (H) 01/18/2023 0959   BUN 11 01/18/2023 0959   CREATININE 0.64 01/18/2023 0959   CALCIUM 8.9 01/18/2023 0959   PROT 6.0 (L) 01/18/2023 0959   ALBUMIN 3.7 01/18/2023 0959   AST 12 (L) 01/18/2023 0959   ALT 9 01/18/2023 0959   ALKPHOS 82 01/18/2023 0959   BILITOT 0.3 01/18/2023 0959   GFRNONAA >60 01/18/2023 0959    No results found for: "SPEP", "UPEP"  Lab Results  Component Value Date   WBC 7.5 01/18/2023   NEUTROABS 6.2 01/18/2023   HGB 11.1 (L) 01/18/2023   HCT 32.6 (L) 01/18/2023   MCV 97.3 01/18/2023   PLT 188 01/18/2023      Chemistry      Component Value Date/Time   NA 142 01/18/2023 0959   K 3.6 01/18/2023 0959   CL 108 01/18/2023 0959   CO2 29 01/18/2023 0959   BUN 11 01/18/2023 0959   CREATININE 0.64 01/18/2023 0959      Component Value Date/Time   CALCIUM 8.9 01/18/2023 0959   ALKPHOS 82 01/18/2023 0959   AST 12 (L) 01/18/2023 0959   ALT 9 01/18/2023 0959   BILITOT 0.3 01/18/2023 0959

## 2023-01-31 MED FILL — Dexamethasone Sodium Phosphate Inj 100 MG/10ML: INTRAMUSCULAR | Qty: 1 | Status: AC

## 2023-02-01 ENCOUNTER — Inpatient Hospital Stay: Payer: BC Managed Care – PPO

## 2023-02-01 ENCOUNTER — Other Ambulatory Visit: Payer: Self-pay

## 2023-02-01 VITALS — BP 136/85 | HR 88 | Temp 97.7°F | Resp 17

## 2023-02-01 DIAGNOSIS — C7800 Secondary malignant neoplasm of unspecified lung: Secondary | ICD-10-CM

## 2023-02-01 DIAGNOSIS — C55 Malignant neoplasm of uterus, part unspecified: Secondary | ICD-10-CM

## 2023-02-01 LAB — CBC WITH DIFFERENTIAL (CANCER CENTER ONLY)
Abs Immature Granulocytes: 0.02 10*3/uL (ref 0.00–0.07)
Basophils Absolute: 0 10*3/uL (ref 0.0–0.1)
Basophils Relative: 1 %
Eosinophils Absolute: 0.1 10*3/uL (ref 0.0–0.5)
Eosinophils Relative: 1 %
HCT: 35.7 % — ABNORMAL LOW (ref 36.0–46.0)
Hemoglobin: 12.3 g/dL (ref 12.0–15.0)
Immature Granulocytes: 0 %
Lymphocytes Relative: 11 %
Lymphs Abs: 0.6 10*3/uL — ABNORMAL LOW (ref 0.7–4.0)
MCH: 33.5 pg (ref 26.0–34.0)
MCHC: 34.5 g/dL (ref 30.0–36.0)
MCV: 97.3 fL (ref 80.0–100.0)
Monocytes Absolute: 0.5 10*3/uL (ref 0.1–1.0)
Monocytes Relative: 9 %
Neutro Abs: 4.5 10*3/uL (ref 1.7–7.7)
Neutrophils Relative %: 78 %
Platelet Count: 287 10*3/uL (ref 150–400)
RBC: 3.67 MIL/uL — ABNORMAL LOW (ref 3.87–5.11)
RDW: 15.9 % — ABNORMAL HIGH (ref 11.5–15.5)
WBC Count: 5.7 10*3/uL (ref 4.0–10.5)
nRBC: 0 % (ref 0.0–0.2)

## 2023-02-01 LAB — CMP (CANCER CENTER ONLY)
ALT: 8 U/L (ref 0–44)
AST: 13 U/L — ABNORMAL LOW (ref 15–41)
Albumin: 4.1 g/dL (ref 3.5–5.0)
Alkaline Phosphatase: 84 U/L (ref 38–126)
Anion gap: 9 (ref 5–15)
BUN: 13 mg/dL (ref 6–20)
CO2: 26 mmol/L (ref 22–32)
Calcium: 9.4 mg/dL (ref 8.9–10.3)
Chloride: 106 mmol/L (ref 98–111)
Creatinine: 0.68 mg/dL (ref 0.44–1.00)
GFR, Estimated: >60 mL/min (ref >60)
Glucose, Bld: 101 mg/dL — ABNORMAL HIGH (ref 70–99)
Potassium: 3.9 mmol/L (ref 3.5–5.1)
Sodium: 141 mmol/L (ref 135–145)
Total Bilirubin: 0.4 mg/dL (ref 0.3–1.2)
Total Protein: 6.8 g/dL (ref 6.5–8.1)

## 2023-02-01 MED ORDER — SODIUM CHLORIDE 0.9 % IV SOLN
10.0000 mg | Freq: Once | INTRAVENOUS | Status: AC
  Administered 2023-02-01: 10 mg via INTRAVENOUS
  Filled 2023-02-01: qty 10

## 2023-02-01 MED ORDER — FAMOTIDINE IN NACL 20-0.9 MG/50ML-% IV SOLN
20.0000 mg | Freq: Once | INTRAVENOUS | Status: AC
  Administered 2023-02-01: 20 mg via INTRAVENOUS
  Filled 2023-02-01: qty 50

## 2023-02-01 MED ORDER — PROCHLORPERAZINE MALEATE 10 MG PO TABS
10.0000 mg | ORAL_TABLET | Freq: Once | ORAL | Status: AC
  Administered 2023-02-01: 10 mg via ORAL
  Filled 2023-02-01: qty 1

## 2023-02-01 MED ORDER — SODIUM CHLORIDE 0.9 % IV SOLN
80.0000 mg/m2 | Freq: Once | INTRAVENOUS | Status: AC
  Administered 2023-02-01: 133 mg via INTRAVENOUS
  Filled 2023-02-01: qty 13.3

## 2023-02-01 MED ORDER — SODIUM CHLORIDE 0.9 % IV SOLN: Freq: Once | INTRAVENOUS | Status: AC

## 2023-02-01 MED ORDER — SODIUM CHLORIDE 0.9% FLUSH: 10.0000 mL | INTRAVENOUS | Status: DC | PRN

## 2023-02-01 MED ORDER — HEPARIN SOD (PORK) LOCK FLUSH 100 UNIT/ML IV SOLN
500.0000 [IU] | Freq: Once | INTRAVENOUS | Status: AC | PRN
  Administered 2023-02-01: 500 [IU]

## 2023-02-01 MED ORDER — SODIUM CHLORIDE 0.9 % IV SOLN
720.0000 mg/m2 | Freq: Once | INTRAVENOUS | Status: AC
  Administered 2023-02-01: 1178 mg via INTRAVENOUS
  Filled 2023-02-01: qty 30.98

## 2023-02-03 ENCOUNTER — Other Ambulatory Visit: Payer: Self-pay

## 2023-02-03 ENCOUNTER — Telehealth: Payer: Self-pay

## 2023-02-03 ENCOUNTER — Inpatient Hospital Stay: Payer: BC Managed Care – PPO

## 2023-02-03 VITALS — BP 134/83 | HR 82 | Temp 97.8°F | Resp 18

## 2023-02-03 DIAGNOSIS — C55 Malignant neoplasm of uterus, part unspecified: Secondary | ICD-10-CM

## 2023-02-03 DIAGNOSIS — C7800 Secondary malignant neoplasm of unspecified lung: Secondary | ICD-10-CM

## 2023-02-03 MED ORDER — PEGFILGRASTIM-CBQV 6 MG/0.6ML ~~LOC~~ SOSY
6.0000 mg | PREFILLED_SYRINGE | Freq: Once | SUBCUTANEOUS | Status: AC
  Administered 2023-02-03: 6 mg via SUBCUTANEOUS
  Filled 2023-02-03: qty 0.6

## 2023-02-03 NOTE — Telephone Encounter (Signed)
Returned her call. She had constipation with treatment on 4/9. She had the issue for about 1 week after the 4/9 treatment and took a colace prn. She was uncomfortable and had abodminal bloating for about 1 week.  With treatment on 4/23 she had constipation on Wednesday after treatment. Today she had a normal bm and feels better with the constipation after taking 2 senokot last night. She is also complaining of abdominal bloating. She is complaining of abdominal discomfort and gas pains. She has been taking Tums prn. She is not sure if it is acid reflux and will continue Tums.  She will start Miralax today for constipation and call the office back for questions/ concerns.

## 2023-02-08 NOTE — Progress Notes (Signed)
Tammie Gilmore presents today for follow up after completing radiation therapy to her brain. She completed treatment on 11-15-22. Pt will receive MRI results from 02-17-23 with this visit.   Recent neurologic symptoms, if any:  Seizures: {:18581} Headaches: {:18581} Nausea: {:18581} Dizziness/ataxia: {:18581} Difficulty with hand coordination: {:18581} Focal numbness/weakness: {:18581} Visual deficits/changes: {:18581} Confusion/Memory deficits: {:18581}  Other issues of note:   MRI results from 02-17-23 IMPRESSION: 1. 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 Additional punctate areas of enhancement in the right frontal lobe are no longer confidently seen. No new lesions identified. 2. Similar subtle skull metastasis.

## 2023-02-09 ENCOUNTER — Telehealth: Payer: Self-pay

## 2023-02-09 NOTE — Telephone Encounter (Signed)
Pt called and states last week she lifted her leg to flush a public toilet with her foot, and she pulled a muscle in her groin. Pt states it hurts to lift her leg and bear weight, but the only area injured is her left groin area. She has been taking tylenol which helps. She states when she had IV decadron and PO decadron following tx, the pain had alleviated, but now that shew is no longer on steroids her pain has returned. She is asking for a steroid to help with inflammation. Advised pt I would speak with Dr Bertis Ruddy and we would let her know recommendation. At this time I recommended epsom salt bath/ heating pad and taking tylenol. She verbalized thanks and understanding.

## 2023-02-10 ENCOUNTER — Encounter: Payer: Self-pay | Admitting: Hematology and Oncology

## 2023-02-10 NOTE — Telephone Encounter (Signed)
Hi Brenda,  Please call her Chronic use of steroid is not recommended I recommend 600 mg ibuprofen TID/PRn for next few days and see if that is helpful until I see her

## 2023-02-10 NOTE — Telephone Encounter (Signed)
Called and her given below message. She verbalized understanding and appreciated the call. She will call the office back for questions/ concerns.

## 2023-02-11 ENCOUNTER — Ambulatory Visit (HOSPITAL_COMMUNITY)
Admission: RE | Admit: 2023-02-11 | Discharge: 2023-02-11 | Disposition: A | Discharge status: Home / Self Care | Payer: BC Managed Care – PPO | Source: Ambulatory Visit | Attending: Hematology and Oncology | Admitting: Hematology and Oncology

## 2023-02-11 ENCOUNTER — Encounter (HOSPITAL_COMMUNITY): Payer: Self-pay

## 2023-02-11 DIAGNOSIS — C7800 Secondary malignant neoplasm of unspecified lung: Secondary | ICD-10-CM | POA: Insufficient documentation

## 2023-02-11 DIAGNOSIS — C55 Malignant neoplasm of uterus, part unspecified: Secondary | ICD-10-CM | POA: Insufficient documentation

## 2023-02-11 MED ORDER — IOHEXOL 300 MG/ML  SOLN
100.0000 mL | Freq: Once | INTRAMUSCULAR | Status: AC | PRN
  Administered 2023-02-11: 100 mL via INTRAVENOUS

## 2023-02-11 MED ORDER — SODIUM CHLORIDE (PF) 0.9 % IJ SOLN
INTRAMUSCULAR | Status: AC
  Filled 2023-02-11: qty 50

## 2023-02-15 ENCOUNTER — Inpatient Hospital Stay: Payer: BC Managed Care – PPO | Attending: Gynecologic Oncology | Admitting: Hematology and Oncology

## 2023-02-15 ENCOUNTER — Inpatient Hospital Stay: Payer: BC Managed Care – PPO

## 2023-02-15 ENCOUNTER — Telehealth: Payer: Self-pay | Admitting: Oncology

## 2023-02-15 ENCOUNTER — Encounter: Payer: Self-pay | Admitting: Hematology and Oncology

## 2023-02-15 ENCOUNTER — Other Ambulatory Visit: Payer: Self-pay

## 2023-02-15 VITALS — BP 138/87 | HR 95 | Temp 98.4°F | Resp 18 | Ht 68.0 in | Wt 130.0 lb

## 2023-02-15 DIAGNOSIS — R180 Malignant ascites: Secondary | ICD-10-CM | POA: Insufficient documentation

## 2023-02-15 DIAGNOSIS — C7800 Secondary malignant neoplasm of unspecified lung: Secondary | ICD-10-CM | POA: Diagnosis not present

## 2023-02-15 DIAGNOSIS — C786 Secondary malignant neoplasm of retroperitoneum and peritoneum: Secondary | ICD-10-CM | POA: Diagnosis not present

## 2023-02-15 DIAGNOSIS — C7931 Secondary malignant neoplasm of brain: Secondary | ICD-10-CM | POA: Diagnosis not present

## 2023-02-15 DIAGNOSIS — K5909 Other constipation: Secondary | ICD-10-CM | POA: Diagnosis not present

## 2023-02-15 DIAGNOSIS — J90 Pleural effusion, not elsewhere classified: Secondary | ICD-10-CM | POA: Diagnosis not present

## 2023-02-15 DIAGNOSIS — C7801 Secondary malignant neoplasm of right lung: Secondary | ICD-10-CM | POA: Diagnosis not present

## 2023-02-15 DIAGNOSIS — Z7952 Long term (current) use of systemic steroids: Secondary | ICD-10-CM | POA: Diagnosis not present

## 2023-02-15 DIAGNOSIS — C7802 Secondary malignant neoplasm of left lung: Secondary | ICD-10-CM | POA: Insufficient documentation

## 2023-02-15 DIAGNOSIS — I700 Atherosclerosis of aorta: Secondary | ICD-10-CM | POA: Insufficient documentation

## 2023-02-15 DIAGNOSIS — C55 Malignant neoplasm of uterus, part unspecified: Secondary | ICD-10-CM

## 2023-02-15 DIAGNOSIS — C7951 Secondary malignant neoplasm of bone: Secondary | ICD-10-CM | POA: Insufficient documentation

## 2023-02-15 NOTE — Assessment & Plan Note (Signed)
She has mild constipation, could be due to recent treatment or disease process She will continue laxatives

## 2023-02-15 NOTE — Progress Notes (Signed)
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, CT imaging shows significant disease progression She is borderline symptomatic with some abdominal discomfort, recent changes in bowel habits with mild constipation and indigestion We discussed recommendation from [facility] She has appointment to return next week and we will forward test results to her other oncologist We discussed risk and benefits of enrollment in clinical trial versus trabectedin versus pazopanib I will look forward to hear back recommendation after her appointment at [facility]  Other constipation She has mild constipation, could be due to recent treatment or disease process She will continue laxatives  No orders of the defined types were placed in this encounter.   All questions were answered. The patient knows to call the clinic with any problems, questions or concerns. The total time spent in the appointment was 40 minutes encounter with patients including review of chart and various tests results, discussions about plan of care and coordination of care plan   Artis Delay, MD 02/15/2023 2:05 PM  INTERVAL HISTORY: Please see below for problem oriented charting. she returns for review of test results with her husband We spent majority of our time reviewing imaging study results She is not symptomatic from pulmonary metastasis; she denies chest pain, cough or shortness of breath She does have some GI symptoms with abdominal discomfort, indigestion and constipation  REVIEW OF SYSTEMS:   Constitutional: Denies fevers, chills or abnormal weight loss Eyes: Denies blurriness of vision Ears, nose, mouth, throat, and face: Denies mucositis or sore throat Respiratory: Denies cough, dyspnea or wheezes Cardiovascular: Denies palpitation, chest discomfort or lower extremity swelling Skin: Denies  abnormal skin rashes Lymphatics: Denies new lymphadenopathy or easy bruising Neurological:Denies numbness, tingling or new weaknesses Behavioral/Psych: Mood is stable, no new changes  All other systems were reviewed with the patient and are negative.  I have reviewed the past medical history, past surgical history, social history and family history with the patient and they are unchanged from previous note.  ALLERGIES:  is allergic to doxycycline.  MEDICATIONS:  Current Outpatient Medications  Medication Sig Dispense Refill   acetaminophen (TYLENOL) 500 MG tablet Take 1,000 mg by mouth every 6 (six) hours as needed for moderate pain or headache.     calcium carbonate (TUMS - DOSED IN MG ELEMENTAL CALCIUM) 500 MG chewable tablet Chew 1 tablet by mouth 2 (two) times daily.     cholecalciferol (VITAMIN D3) 25 MCG (1000 UNIT) tablet Take 2,000 Units by mouth daily.     dexamethasone (DECADRON) 4 MG tablet Take 0.5 tablets (2 mg total) by mouth daily.     estradiol (ESTRACE) 0.1 MG/GM vaginal cream PLACE FINGER TIP SIZE AMOUNT OF CREAM AND INSERT SLIGHTLY PAST THE VAGINAL ENTRANCE 3 TIMES DAILY 126 g 4   lidocaine (XYLOCAINE) 2 % solution SMARTSIG:By Mouth     lidocaine-prilocaine (EMLA) cream Apply to affected area once 30 g 3   loratadine (CLARITIN) 10 MG tablet Take 10 mg by mouth daily as needed (for bone aches).     LORazepam (ATIVAN) 0.5 MG tablet Take 1 tablet (0.5 mg total) by mouth 2 (two) times daily as needed for anxiety. 30 tablet 0   magic mouthwash (nystatin, diphenhydrAMINE, alum & mag hydroxide) suspension mixture Swish and spit 5 mLs 4 (four) times daily as needed for mouth pain. 240 mL 0   metoprolol succinate (TOPROL XL) 25 MG  24 hr tablet Take 1 tablet (25 mg total) by mouth at bedtime. 30 tablet 6   ondansetron (ZOFRAN) 8 MG tablet Take 1 tablet (8 mg total) by mouth every 8 (eight) hours as needed. 30 tablet 1   prochlorperazine (COMPAZINE) 10 MG tablet Take 1 tablet (10 mg  total) by mouth every 6 (six) hours as needed (Nausea or vomiting). 90 tablet 1   senna (SENOKOT) 8.6 MG TABS tablet Take 2 tablets by mouth at bedtime.     No current facility-administered medications for this visit.    SUMMARY OF ONCOLOGIC HISTORY: Oncology History  Uterine leiomyosarcoma (HCC)  06/11/2021 Imaging   1. 9.5 x 7.6 x 9.0 cm complex, partially necrotic, mass involving the lower uterine segment/ cervix. No obvious direct extension into the parametrium.  2. 9 mm left pelvic sidewall lymph node is partially necrotic and worrisome for metastatic adenopathy.  3. No findings for abdominal omental or peritoneal surface disease or adenopathy.  4. Tiny low-attenuation lesion in the pancreatic head, likely benign cyst but attention on follow-up scans is suggested.  5. 2.9 cm fundal fibroid.    06/19/2021 Pathology Results   FINAL MICROSCOPIC DIAGNOSIS:   A. UTERINE, CERVICAL MASS, BIOPSY:  - Spindle cell malignancy.  - See comment.   COMMENT:  The biopsies consist of endocervical mucosa with stromal edema and one biopsy fragment has a microscopic focus with atypical spindle cells consistent with poorly differentiated malignancy.  The differential  includes a spindle cell malignancy such as sarcomatoid carcinoma and leiomyosarcoma.  Mullerian adenosarcoma is also a consideration but considered less likely   06/23/2021 Imaging   MR pelvis  10 cm uterine mass with central necrosis, which is centered in the cervix and lower uterine segment. Right parametrial involvement is seen as well as suspected invasion of the distal rectum. Differential diagnosis includes cervical carcinoma and uterine leiomyosarcoma.   Mild bilateral iliac lymphadenopathy, highly suspicious for metastatic disease.   2.9 cm subserosal fibroid in the posterior fundus.   Normal appearance of both ovaries.     06/29/2021 PET scan   1. Hypermetabolic necrotic cervical/uterine mass with bilateral external iliac  hypermetabolic lymph nodes. No evidence of distant metastatic disease. 2. 1.5 cm low-attenuation left thyroid nodule. Recommend thyroid ultrasound. (Ref: J Am Coll Radiol. 2015 Feb;12(2): 143-50).   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, pleomorphic, and myxoid areas and associated necrosis (~20%) - Tumor based in cervix and also involves lower uterine segment - Cervicovaginal margin involved by focal invasive leiomyosarcoma (3:00-5:00, A10) as well as tumor in lymphovascular spaces - Leiomyosarcoma involves right and left parametrial tissue and extends to parametrial margins - Extensive lymphovascular space invasion present, including in uterus and parametria - See synoptic report and comment   Other findings: - Leiomyomata with hyalinization, size up to 3.0 cm - Ovaries and fallopian tubes with no parenchymal involvement by leiomyosarcoma identified, although adnexal lymphovascular space invasion is present   B: Lymph nodes, right pelvic, lymphadenectomy - One of four lymph nodes positive for metastatic leiomyosarcoma (1/4), with extracapsular extension present   C: Lymph nodes, left pelvic, lymphadenectomy - One of four lymph nodes positive for metastatic leiomyosarcoma (1/4), with extracapsular extension present  Immunohistochemical stains are performed on block A11, and demonstrate that the tumor is positive for desmin and CD10, with SMA staining the majority of the spindle  cell component but largely negative in the epithelioid / pleomorphic component. OSCAR, pancytokeratin AE1/AE3, HMB45, and PR appear negative in the tumor. ER shows patchy weak staining and myogenin stains rare cells. Block A23 also shows positive desmin and negative OSCAR pancytokeratin. Overall, the findings are most consistent with  leiomyosarcoma, with extensive areas that are myxoid, epithelioid, and pleomorphic as well as more typical spindle cell areas within the overall high grade tumor (grade 3 / 3). The tumor is staged as pT2b (involves other pelvic tissues) given the parametrial involvement and pN1 for FIGO stage IIIC.    07/17/2021 Surgery   Date of Surgery: 07/17/21  Preoperative Diagnosis: High Grade Uterine Sarcoma  Postoperative Diagnosis: Same  Procedure(s): Bilateral - RADICAL ABDOMINAL HYSTER, W/BIL TOTAL PELVIC LYMPHADENECTOMY & PARA-AORTIC LYMPH NODE BX W/WO REM TUBE/OVAR VAGINAL HYSTERECTOMY, FOR UTERUS 250 G OR LESS; WITH REPAIR OF ENTEROCELE COLECTOMY, PARTIAL; WITH COLOPROCTOSTOMY (LOW PELVIC ANASTOMOSIS) WITH COLOSTOMY CYSTOURETHROSCOPY, WITH INSERTION OF INDWELLING URETERAL STENT (EG, GIBBONS OR DOUBLE-J TYPE) - Cystourethroscopy - Bilateral ureteral stent placement - Foley catheter placement  Performing Service: Gynecology Oncology Surgeon(s) and Role: Panel 1: * Carver Fila, MD - Primary * Clide Cliff, MD - Resident - Assisting * Irene Pap, MD - Resident - Assisting Panel 2: * Annye English, MD - Primary  Drains:  - Left 6Fr open-ended ureteral access catheter (green) - Right 5Fr open-ended ureteral access catheter (white) - 16Fr foley catheter to drainage  * No implants in log *  Indications: 57 y.o. female with high grade uterine sarcoma. Urology was consulted pre-operatively for placement of bilateral ureteral stents. Risks, benefits, and alternatives of the above procedure were discussed and informed consent was signed.  OperativeFindings:  - Grossly distorted architecture of urinary bladder likely 2/2 pelvic mass with anterolaterally positioned UOs - Successful placement of bilateral open-ended ureteral catheters under direct visualization - Foley catheter placed at case conclusion  Description: The patient was correctly identified in the preop  holding area where written informed consent as well potential risk and complication reviewed. She agreed. The patient was brought to the operative suite where a preinduction timeout was performed. Once correct information was verified, general anesthesia was induced. The patient was then gently placed into dorsal lithotomy position with SCDs in place for VTE prophylaxis. They were prepped and draped in the usual sterile fashion and given appropriate preoperative antibiotics. A second timeout was then performed.   We inserted a 41F rigid cystoscope per urethra with copious lubrication and normal saline irrigation running. We performed cystourethroscopy, which revealed the above findings.  We turned our attention to the left ureteral orifice and canulated it with a sensor wire, using assistance of a 6Fr open-ended catheter. The wire was advanced into the renal pelvis without difficulty under visual guidance. We then advanced the stent over our wire into the renal pelvis under direct visualization and feel without complication. The wire was subsequently removed.   We then turned our attention to the right ureteral orifice and canulated it with a sensor wire, using assistance of a 5Fr open-ended catheter. The wire was advanced into the renal pelvis without difficulty under visual guidance. We then advanced the stent over our wire into the renal pelvis under direct visualization and feel without complication. The wire was subsequently removed.   A 16Fr straight catheter was placed, with return of urine indicating appropriate position within the bladder. The balloon was inflated with 10cc sterile water. The stents were secured to the Foley  using 0-silk ties, being careful not to occlude the stents or Foley.   The patient was awoken from general anesthesia having tolerated the procedure well and taken to the PACU for routine post-operative recovery.  Post-Op Plan:  - Foley and stents per primary team     07/17/2021 Surgery   Date of Surgery: 07/17/2021  Pre-op Diagnosis: Uterine spindle cell malignancy  Post-op Diagnosis: Same  Procedure(s): Panel 1 RADICAL ABDOMINAL HYSTER, with bilateral S&O, vagineconty upper, bilateral pelvic lyphadenectomy, bilateral ureterolysis,: 58210 (CPT) Panel 2 CYSTOURETHROSCOPY, WITH INSERTION OF INDWELLING URETERAL STENT (EG, GIBBONS OR DOUBLE-J TYPE): 52332 (CPT) Note: Revisions to procedures should be made in chart - see Procedures activity.  Performing Service: Gynecology Oncology Surgeon(s) and Role: Panel 1: * Carver Fila, MD - Primary * Clide Cliff, MD - Resident - Assisting * Irene Pap, MD - Resident - Assisting Panel 2: * Annye English, MD - Primary  Findings: On bimanual exam, 10cm necrotic mass filling upper vagina, unable to discretely palpate the cervix. On rectovaginal exam, rectal involvement not identified. Intraoperatively, normal upper abdominal survey including normal liver, diaphragm, stomach, omentum and bowel. Small uterus with 10cm mass expanding the cervix. Palpably enlarged bilateral pelvic lymph nodes adherent to the external iliac veins and obturator nerves, removed. No palpable para-aortic lymphadenopathy. No rectal involvement of uterine mass.   Specimens:  ID Type Source Tests Collected by Time Destination  1 : uterus,cervix,bilateral tubes/ovaries Tissue Uterus SURGICAL PATHOLOGY EXAM Carver Fila, MD 07/17/2021 0932  2 : right pelvic lymph node Tissue Lymph Node SURGICAL PATHOLOGY EXAM Carver Fila, MD 07/17/2021 1125  3 : LEFT PELVIC LN Tissue Lymph Node SURGICAL PATHOLOGY EXAM Carver Fila, MD 07/17/2021 1144    08/06/2021 Initial Diagnosis   Uterine leiomyosarcoma (HCC)   08/06/2021 Cancer Staging   Staging form: Corpus Uteri - Leiomyosarcoma and Endometrial Stromal Sarcoma, AJCC 8th Edition - Pathologic stage from 08/06/2021: FIGO Stage IVB (pT3, pN1, cM1) - Signed by  Artis Delay, MD on 08/11/2021 Stage prefix: Initial diagnosis   08/10/2021 Imaging   CT abdomen and pelvis 1. Interval development of left lobe pulmonary nodules, measuring up to 7 mm and highly for metastatic disease. 2. Interval development of small to upper normal lymph nodes in the pelvis, concerning for metastatic disease. 3. Postoperative seroma left pelvic sidewall. 4. Tiny cluster of tree-in-bud opacity in the peripheral right lower lobe is new and compatible with sequelae of atypical infection.   08/13/2021 Procedure   Procedure: Placement of a right IJ approach single lumen PowerPort.  Tip is positioned at the superior cavoatrial junction and catheter is ready for immediate use.  Complications: No immediate   08/14/2021 Echocardiogram    1. Left ventricular ejection fraction, by estimation, is 60 to 65%. The left ventricle has normal function. The left ventricle has no regional wall motion abnormalities. Left ventricular diastolic parameters were normal. The average left ventricular global longitudinal strain is -17.4 %. The global longitudinal strain is normal.  2. Right ventricular systolic function is normal. The right ventricular size is normal.  3. The mitral valve is normal in structure. No evidence of mitral valve regurgitation. No evidence of mitral stenosis.  4. The aortic valve is tricuspid. Aortic valve regurgitation is not visualized. No aortic stenosis is present.  5. The inferior vena cava is normal in size with greater than 50% respiratory variability, suggesting right atrial pressure of 3 mmHg.     08/17/2021 Imaging   Multiple new  and enlarging pulmonary nodules scattered throughout the lungs bilaterally, highly concerning for progressive metastatic disease to the lungs   08/18/2021 - 11/10/2021 Chemotherapy   Patient is on Treatment Plan : UTERINE LEIOMYOSARCOMA Doxorubicin q21d x 6 Cycles     08/18/2021 - 08/12/2022 Chemotherapy   Patient is on Treatment Plan :  UTERINE UNDIFFERENTIATED LEIOMYOSARCOMA Gemcitabine D1,8 + Docetaxel D8 (900/100) q21d     11/09/2021 Imaging   IMPRESSION: 1. Multiple small bilateral pulmonary nodules, some of which are slightly increased in size. Other nodules unchanged. 2. Interval decrease in size of left pelvic sidewall lymph nodes. 3. Unchanged size of perirectal lymph nodes or soft tissue nodules. These however demonstrate new internal hypodensity, suggesting treatment response and internal necrosis. 4. Unchanged left iliac lymph node or peritoneal nodule. 5. Findings are consistent with mixed response to treatment. No evidence of new metastatic disease in the chest, abdomen, or pelvis. 6. Wall thickening and mucosal hyperenhancement of the bladder, consistent with nonspecific infectious or inflammatory cystitis. Correlate with urinalysis. 7. Status post hysterectomy and oophorectomy. Interval resolution of a previously noted left pelvic hematoma or seroma. 8. Trace, nonspecific free fluid in the low pelvis.   11/30/2021 Echocardiogram    1. Left ventricular ejection fraction, by estimation, is 40 to 45%. Left ventricular ejection fraction by 3D volume is 41 %. The left ventricle has mildly decreased function. The left ventricle has no regional wall motion abnormalities. Left ventricular  diastolic parameters are consistent with Grade I diastolic dysfunction (impaired relaxation).  2. Right ventricular systolic function is moderately reduced. The right ventricular size is normal.  3. The mitral valve is grossly normal. No evidence of mitral valve regurgitation.  4. The aortic valve is normal in structure. Aortic valve regurgitation is not visualized. No aortic stenosis is present.     12/07/2021 - 05/31/2022 Chemotherapy   Patient is on Treatment Plan : UTERINE UNDIFFERENTIATED / LEIOMYOSARCOMA Gemcitabine D1,8 + Docetaxel D8 (900/100) q21d     02/05/2022 Imaging   Pathologic burst fracture of L3 due to a metastatic  lesion, with probable mild degree of right-sided extraosseous tumor extension in the paraspinal soft tissues, and mild involvement of the right pedicle. No epidural/spinal canal involvement.   Multilevel degenerative disc disease without any significant stenosis in the lumbar spine.   05/31/2022 Imaging   1. Signs of pelvic resolution of pelvic sidewall nodal disease/soft tissue near the LEFT vaginal apex. 2. Decreased conspicuity of RIGHT lower lobe pulmonary nodule and stable LEFT apical pulmonary nodule. 3. Unchanged appearance of pathologic fracture at L3. 4. Urinary bladder wall thickening, slightly improved posteriorly, anterior urinary bladder may show some residual diffuse thickening. Continued correlation with signs of cystitis is suggested.     08/24/2022 Imaging   1. Interval growth of a solitary right external iliac nodal metastasis. No additional sites of new or progressive metastatic disease. 2. Small bilateral upper lobe pulmonary nodules are stable to mildly decreased. 3. Chronic incompletely healed pathologic L3 vertebral fracture with underlying lytic metastasis, not appreciably changed. No new focal osseous lesions. 4. New trace dependent bilateral pleural effusions. 5.  Aortic Atherosclerosis (ICD10-I70.0).     08/27/2022 Procedure   1. Successful L3 Osteocool RFA ablation. 2. Successful L3 kyphoplasty.   10/14/2022 Imaging   Focal soft tissue swelling along the occipital region seen on lateral view may correspond to palpable area of clinical concern. Lytic calvarial lesion underlying this area measures 3.2 cm in craniocaudal dimension, suspicious for osseous metastasis in the setting of known  malignancy. Recommend contrast-enhanced MRI or CT for further evaluation.   10/21/2022 Imaging   MRI brain 1. 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 2. 3 cm midline occipital skull metastasis with extension into the overlying scalp  soft tissues.   10/26/2022 Imaging   1. Multiple new and enlarged bilateral pulmonary nodules, consistent with worsened pulmonary metastatic disease. 2. Interval enlargement of a necrotic appearing right external iliac lymph node, consistent with worsened nodal metastatic disease. 3. Multiple new small peritoneal nodules, consistent with peritoneal metastatic disease. 4. Trace ascites in the low pelvis, presumed malignant. 5. Interval vertebral cement augmentation of a pathologic fracture of L3. 6. Status post hysterectomy.   10/27/2022 Imaging   Bone scan  1. 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 2. No additional foci of abnormal increased uptake suggestive of metastatic disease.   11/05/2022 Echocardiogram   1. Left ventricular ejection fraction, by estimation, is 60 to 65%. The left ventricle has normal function. The left ventricle has no regional wall motion abnormalities. Left ventricular diastolic parameters were normal. The average left ventricular global longitudinal strain is -15.0 %. The global longitudinal strain is abnormal.  2. Right ventricular systolic function is normal. The right ventricular size is normal. Tricuspid regurgitation signal is inadequate for assessing PA pressure.  3. No evidence of mitral valve regurgitation.  4. The aortic valve is grossly normal. Aortic valve regurgitation is not visualized.  5. The inferior vena cava is normal in size with greater than 50% respiratory variability, suggesting right atrial pressure of 3 mmHg.   11/24/2022 - 02/03/2023 Chemotherapy   Patient is on Treatment Plan : UTERINE UNDIFFERENTIATED LEIOMYOSARCOMA Gemcitabine D1,8 + Docetaxel D8 (900/100) q21d     02/15/2023 Imaging   CT CHEST ABDOMEN PELVIS W CONTRAST  Result Date: 02/14/2023 CLINICAL DATA:  Cervical cancer and uterine leiomyosarcoma on chemotherapy. Restaging. * Tracking Code: BO * EXAM: CT  CHEST, ABDOMEN, AND PELVIS WITH CONTRAST TECHNIQUE: Multidetector CT imaging of the chest, abdomen and pelvis was performed following the standard protocol during bolus administration of intravenous contrast. RADIATION DOSE REDUCTION: This exam was performed according to the departmental dose-optimization program which includes automated exposure control, adjustment of the mA and/or kV according to patient size and/or use of iterative reconstruction technique. CONTRAST:  OMNIPAQUE IOHEXOL 300 MG/ML  SOLN COMPARISON:  10/22/2022 CT chest, abdomen and pelvis. FINDINGS: CT CHEST FINDINGS Cardiovascular: Normal heart size. No significant pericardial effusion/thickening. Right internal jugular Port-A-Cath terminates at the cavoatrial junction. Great vessels are normal in course and caliber. No central pulmonary emboli. Mediastinum/Nodes: Stable small bilateral thyroid nodules, largest 1.0 cm in the inferior left thyroid lobe. Not clinically significant; no follow-up imaging recommended (ref: J Am Coll Radiol. 2015 Feb;12(2): 143-50). Unremarkable esophagus. No axillary adenopathy. No pathologically enlarged mediastinal or hilar nodes. Lungs/Pleura: No pneumothorax. Small posterior right pleural effusion is new. No left pleural effusion. Numerous (at least 10) solid pulmonary nodules scattered throughout both lungs, all substantially increased. Representative 2.7 x 2.3 cm medial right upper lobe nodule (series 6/image 40), previously 1.3 x 1.0 cm. Representative 1.1 x 0.8 cm superior segment left lower lobe nodule (series 6/image 58), new. Representative 1.3 cm left lower lobe nodule (series 6/image 86), previously 0.2 cm. Mild passive atelectasis at the right lung base. Musculoskeletal:  No aggressive appearing focal osseous lesions. CT ABDOMEN PELVIS FINDINGS Hepatobiliary: Small hypodense 0.8 cm lesion along the  posterior right liver capsule (series 2/image 58), new. Otherwise normal liver. Normal gallbladder  with no radiopaque cholelithiasis. No biliary ductal dilatation. Pancreas: Normal, with no mass or duct dilation. Spleen: Normal size. No mass. Adrenals/Urinary Tract: Normal adrenals. Normal kidneys with no hydronephrosis and no renal mass. Normal bladder. Stomach/Bowel: Normal non-distended stomach. Normal caliber small bowel with no small bowel wall thickening. Appendix not discretely visualized. Normal large bowel with no diverticulosis, large bowel wall thickening or pericolonic fat stranding. Vascular/Lymphatic: Atherosclerotic nonaneurysmal abdominal aorta. Patent portal, splenic, hepatic and renal veins. Infiltrative 2.3 cm short axis diameter right external iliac lymph node (series 2/image 97), increased from 1.6 cm. No additional pathologically enlarged lymph nodes in the abdomen or pelvis. Reproductive: Hysterectomy. Other: No pneumoperitoneum. Small volume ascites is increased. Innumerable bulky peritoneal masses and nodules scattered throughout the peritoneal cavity, substantially increased. Representative 7.3 x 5.1 cm irregular anterior midline peritoneal mass (series 2/image 82), probably correlating to the previous 1.7 x 1.2 cm nodule. Right omental 4.7 x 2.9 cm peritoneal mass (series 2/image 83), previously 1.0 x 0.9 cm. High left upper quadrant anterior peritoneal 1.9 x 1.6 cm mass (series 2/image 56), new. Left deep pelvic 2.8 x 2.4 cm peritoneal mass (series 2/image 110), previously 0.7 x 0.6 cm. Musculoskeletal: Chronic mild L3 vertebral pathologic fracture status post vertebroplasty. No new focal osseous lesions. Mild lumbar spondylosis. IMPRESSION: 1. Substantial interval progression of widespread bilateral pulmonary metastases. 2. Substantial interval progression of widespread bulky peritoneal carcinomatosis. Small volume malignant ascites is increased. 3. Infiltrative 2.3 cm short axis diameter right external iliac nodal metastasis, increased. 4. Small posterior right pleural effusion, new.  5.  Aortic Atherosclerosis (ICD10-I70.0). Electronically Signed   By: Delbert Phenix M.D.   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 Multiple small bilateral pulmonary nodules, some of which are slightly increased in size. Other nodules unchanged. 2. Interval decrease in size of left pelvic sidewall lymph nodes. 3. Unchanged size of perirectal lymph nodes or soft tissue nodules. These however demonstrate new internal hypodensity, suggesting treatment response and internal necrosis. 4. Unchanged left iliac lymph node or peritoneal nodule. 5. Findings are consistent with mixed response to treatment. No evidence of new metastatic disease in the chest, abdomen, or pelvis. 6. Wall thickening and mucosal hyperenhancement of the bladder, consistent with nonspecific infectious or inflammatory cystitis. Correlate with urinalysis. 7. Status post hysterectomy and oophorectomy. Interval resolution of a previously noted left pelvic hematoma or seroma. 8. Trace, nonspecific free fluid in the low pelvis.   12/07/2021 - 05/31/2022 Chemotherapy   Patient is on Treatment Plan : UTERINE UNDIFFERENTIATED / LEIOMYOSARCOMA Gemcitabine D1,8 + Docetaxel D8 (900/100) q21d     11/24/2022 - 02/03/2023 Chemotherapy   Patient is on Treatment Plan : UTERINE UNDIFFERENTIATED LEIOMYOSARCOMA Gemcitabine D1,8 + Docetaxel D8 (900/100) q21d       PHYSICAL EXAMINATION: ECOG PERFORMANCE STATUS: 1 - Symptomatic but completely ambulatory  Vitals:   02/15/23 0828  BP: 138/87  Pulse: 95  Resp: 18  Temp: 98.4 F (36.9 C)   SpO2: 100%   Filed Weights  02/15/23 0828  Weight: 130 lb (59 kg)    GENERAL:alert, no distress and comfortable  NEURO: alert & oriented x 3 with fluent speech, no focal motor/sensory deficits  LABORATORY DATA:  I have reviewed the data as listed    Component Value Date/Time   NA 141 02/01/2023 0923   K 3.9 02/01/2023 0923   CL 106 02/01/2023 0923   CO2 26 02/01/2023 0923   GLUCOSE 101 (H) 02/01/2023 0923   BUN 13 02/01/2023 0923   CREATININE 0.68 02/01/2023 0923   CALCIUM 9.4 02/01/2023 0923   PROT 6.8 02/01/2023 0923   ALBUMIN 4.1 02/01/2023 0923   AST 13 (L) 02/01/2023 0923   ALT 8 02/01/2023 0923   ALKPHOS 84 02/01/2023 0923   BILITOT 0.4 02/01/2023 0923   GFRNONAA >60 02/01/2023 0923    No results found for: "SPEP", "UPEP"  Lab Results  Component Value Date   WBC 5.7 02/01/2023   NEUTROABS 4.5 02/01/2023   HGB 12.3 02/01/2023   HCT 35.7 (L) 02/01/2023   MCV 97.3 02/01/2023   PLT 287 02/01/2023      Chemistry      Component Value Date/Time   NA 141 02/01/2023 0923   K 3.9 02/01/2023 0923   CL 106 02/01/2023 0923   CO2 26 02/01/2023 0923   BUN 13 02/01/2023 0923   CREATININE 0.68 02/01/2023 0923      Component Value Date/Time   CALCIUM 9.4 02/01/2023 0923   ALKPHOS 84 02/01/2023 0923   AST 13 (L) 02/01/2023 0923   ALT 8 02/01/2023 0923   BILITOT 0.4 02/01/2023 0923       RADIOGRAPHIC STUDIES: I have personally reviewed the radiological images as listed and agreed with the findings in the report. 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 Restaging. * Tracking Code: BO * EXAM: CT CHEST, ABDOMEN, AND PELVIS WITH CONTRAST TECHNIQUE: Multidetector CT imaging of the chest, abdomen and pelvis was performed following the standard protocol during bolus administration of intravenous contrast. RADIATION DOSE REDUCTION: This exam was performed according to the  departmental dose-optimization program which includes automated exposure control, adjustment of the mA and/or kV according to patient size and/or use of iterative reconstruction technique. CONTRAST:  OMNIPAQUE IOHEXOL 300 MG/ML  SOLN COMPARISON:  10/22/2022 CT chest, abdomen and pelvis. FINDINGS: CT CHEST FINDINGS Cardiovascular: Normal heart size. No significant pericardial effusion/thickening. Right internal jugular Port-A-Cath terminates at the cavoatrial junction. Great vessels are normal in course and caliber. No central pulmonary emboli. Mediastinum/Nodes: Stable small bilateral thyroid nodules, largest 1.0 cm in the inferior left thyroid lobe. Not clinically significant; no follow-up imaging recommended (ref: J Am Coll Radiol. 2015 Feb;12(2): 143-50). Unremarkable esophagus. No axillary adenopathy. No pathologically enlarged mediastinal or hilar nodes. Lungs/Pleura: No pneumothorax. Small posterior right pleural effusion is new. No left pleural effusion. Numerous (at least 10) solid pulmonary nodules scattered throughout both lungs, all substantially increased. Representative 2.7 x 2.3 cm medial right upper lobe nodule (series 6/image 40), previously 1.3 x 1.0 cm. Representative 1.1 x 0.8 cm superior segment left lower lobe nodule (series 6/image 58), new. Representative 1.3 cm left lower lobe nodule (series 6/image 86), previously 0.2 cm. Mild passive atelectasis at the right lung base. Musculoskeletal:  No aggressive appearing focal osseous lesions. CT ABDOMEN PELVIS FINDINGS Hepatobiliary: Small hypodense 0.8 cm lesion along the posterior right liver capsule (series 2/image 58), new. Otherwise normal liver. Normal gallbladder with no  radiopaque cholelithiasis. No biliary ductal dilatation. Pancreas: Normal, with no mass or duct dilation. Spleen: Normal size. No mass. Adrenals/Urinary Tract: Normal adrenals. Normal kidneys with no hydronephrosis and no renal mass. Normal bladder. Stomach/Bowel: Normal  non-distended stomach. Normal caliber small bowel with no small bowel wall thickening. Appendix not discretely visualized. Normal large bowel with no diverticulosis, large bowel wall thickening or pericolonic fat stranding. Vascular/Lymphatic: Atherosclerotic nonaneurysmal abdominal aorta. Patent portal, splenic, hepatic and renal veins. Infiltrative 2.3 cm short axis diameter right external iliac lymph node (series 2/image 97), increased from 1.6 cm. No additional pathologically enlarged lymph nodes in the abdomen or pelvis. Reproductive: Hysterectomy. Other: No pneumoperitoneum. Small volume ascites is increased. Innumerable bulky peritoneal masses and nodules scattered throughout the peritoneal cavity, substantially increased. Representative 7.3 x 5.1 cm irregular anterior midline peritoneal mass (series 2/image 82), probably correlating to the previous 1.7 x 1.2 cm nodule. Right omental 4.7 x 2.9 cm peritoneal mass (series 2/image 83), previously 1.0 x 0.9 cm. High left upper quadrant anterior peritoneal 1.9 x 1.6 cm mass (series 2/image 56), new. Left deep pelvic 2.8 x 2.4 cm peritoneal mass (series 2/image 110), previously 0.7 x 0.6 cm. Musculoskeletal: Chronic mild L3 vertebral pathologic fracture status post vertebroplasty. No new focal osseous lesions. Mild lumbar spondylosis. IMPRESSION: 1. Substantial interval progression of widespread bilateral pulmonary metastases. 2. Substantial interval progression of widespread bulky peritoneal carcinomatosis. Small volume malignant ascites is increased. 3. Infiltrative 2.3 cm short axis diameter right external iliac nodal metastasis, increased. 4. Small posterior right pleural effusion, new. 5.  Aortic Atherosclerosis (ICD10-I70.0). Electronically Signed   By: Delbert Phenix M.D.   On: 02/14/2023 10:12

## 2023-02-15 NOTE — Assessment & Plan Note (Signed)
Unfortunately, CT imaging shows significant disease progression She is borderline symptomatic with some abdominal discomfort, recent changes in bowel habits with mild constipation and indigestion We discussed recommendation from [facility] She has appointment to return next week and we will forward test results to her other oncologist We discussed risk and benefits of enrollment in clinical trial versus trabectedin versus pazopanib I will look forward to hear back recommendation after her appointment at [facility]

## 2023-02-15 NOTE — Telephone Encounter (Signed)
Called Dr. Brynda Rim office at [facility] to try to move up Rease's appointment to 02/18/23.  Spoke with Shanda Bumps and Dr. Meredith Mody does not have any openings this Friday.  Also advised her that Shannae has a CT on 02/11/23 that showed progression of her cancer and that we will powershare the images.  Called Myrikal and let her know to keep her appointment with Dr. Meredith Mody on 02/22/23.

## 2023-02-17 ENCOUNTER — Ambulatory Visit
Admission: RE | Admit: 2023-02-17 | Discharge: 2023-02-17 | Disposition: A | Discharge status: Home / Self Care | Payer: BC Managed Care – PPO | Source: Ambulatory Visit | Attending: Radiation Oncology | Admitting: Radiation Oncology

## 2023-02-17 DIAGNOSIS — C7931 Secondary malignant neoplasm of brain: Secondary | ICD-10-CM

## 2023-02-17 MED ORDER — GADOPICLENOL 0.5 MMOL/ML IV SOLN
6.0000 mL | Freq: Once | INTRAVENOUS | Status: AC | PRN
  Administered 2023-02-17: 6 mL via INTRAVENOUS

## 2023-02-21 ENCOUNTER — Inpatient Hospital Stay: Payer: BC Managed Care – PPO

## 2023-02-22 ENCOUNTER — Other Ambulatory Visit: Payer: Self-pay

## 2023-02-22 ENCOUNTER — Encounter: Payer: Self-pay | Admitting: Radiation Oncology

## 2023-02-22 ENCOUNTER — Ambulatory Visit
Admission: RE | Admit: 2023-02-22 | Discharge: 2023-02-22 | Disposition: A | Discharge status: Home / Self Care | Payer: BC Managed Care – PPO | Source: Ambulatory Visit | Attending: Radiation Oncology | Admitting: Radiation Oncology

## 2023-02-22 VITALS — BP 143/85 | HR 100 | Temp 98.4°F | Resp 18 | Wt 127.4 lb

## 2023-02-22 DIAGNOSIS — C7801 Secondary malignant neoplasm of right lung: Secondary | ICD-10-CM | POA: Diagnosis not present

## 2023-02-22 DIAGNOSIS — I700 Atherosclerosis of aorta: Secondary | ICD-10-CM | POA: Insufficient documentation

## 2023-02-22 DIAGNOSIS — Z7952 Long term (current) use of systemic steroids: Secondary | ICD-10-CM | POA: Diagnosis not present

## 2023-02-22 DIAGNOSIS — C541 Malignant neoplasm of endometrium: Secondary | ICD-10-CM | POA: Diagnosis not present

## 2023-02-22 DIAGNOSIS — C7802 Secondary malignant neoplasm of left lung: Secondary | ICD-10-CM | POA: Diagnosis not present

## 2023-02-22 DIAGNOSIS — C7931 Secondary malignant neoplasm of brain: Secondary | ICD-10-CM | POA: Diagnosis present

## 2023-02-22 DIAGNOSIS — J90 Pleural effusion, not elsewhere classified: Secondary | ICD-10-CM | POA: Diagnosis not present

## 2023-02-22 DIAGNOSIS — C7951 Secondary malignant neoplasm of bone: Secondary | ICD-10-CM | POA: Insufficient documentation

## 2023-02-22 DIAGNOSIS — C775 Secondary and unspecified malignant neoplasm of intrapelvic lymph nodes: Secondary | ICD-10-CM | POA: Diagnosis not present

## 2023-02-22 DIAGNOSIS — Z923 Personal history of irradiation: Secondary | ICD-10-CM | POA: Insufficient documentation

## 2023-02-22 DIAGNOSIS — E042 Nontoxic multinodular goiter: Secondary | ICD-10-CM | POA: Insufficient documentation

## 2023-02-22 NOTE — Progress Notes (Signed)
Radiation Oncology         [phone] ________________________________  Name: Tammie Gilmore MRN: [id number]  Date: 02/22/2023  DOB: 28-Jul-1966  Follow-Up Visit Note  Outpatient  CC: [name], Ananias Pilgrim, MD  Artis Delay, MD  Diagnosis and Prior Radiotherapy:    ICD-10-CM   1. Metastasis to brain [facility])  C79.31      CHIEF COMPLAINT: Here for follow-up and surveillance of brain cancer  Narrative:  The patient returns today for routine follow-up and to review recent MRI.  Tammie Gilmore presents today for follow up after completing SRS treatment to 4 brain metastases on 11-12-22. Her most recent MRI on 02/17/23 showed excellent response with no new disease progression.  I personally reviewed her images.  ==========DELIVERED PLANS==========  First Treatment Date: 2022-11-01 - Last Treatment Date: 2022-11-15   Plan Name: Brain_Cranium Site: Cranium Technique: 3D Mode: Photon Dose Per Fraction: 3 Gy Prescribed Dose (Delivered / Prescribed): 30 Gy / 30 Gy Prescribed Fxs (Delivered / Prescribed): 10 / 10   Plan Name: Brain_SRS Site: Brain Technique: SBRT/SRT-IMRT Mode: Photon Dose Per Fraction: 20 Gy Prescribed Dose (Delivered / Prescribed): 20 Gy / 20 Gy Prescribed Fxs (Delivered / Prescribed): 1 / 1       Recent neurologic symptoms, if any:  Seizures: None Headaches: Denies Nausea: Reports mild nausea and decrease in appetite    Wt Readings from Last 3 Encounters:  02/22/23 127 lb 7 oz (57.8 kg)  02/15/23 130 lb (59 kg)  01/18/23 133 lb (60.3 kg)    Dizziness/ataxia: Denies Difficulty with hand coordination: Denies Focal numbness/weakness: Denies Visual deficits/changes: Denies any new issues or concerns. Continues to see a parentheses shape occasionally in the left perpherry after looking at phone, as well as small flashes of light Confusion/Memory deficits: Denies             ALLERGIES:  is allergic to doxycycline.  Meds: Current Outpatient Medications  Medication  Sig Dispense Refill   acetaminophen (TYLENOL) 500 MG tablet Take 1,000 mg by mouth every 6 (six) hours as needed for moderate pain or headache.     calcium carbonate (TUMS - DOSED IN MG ELEMENTAL CALCIUM) 500 MG chewable tablet Chew 1 tablet by mouth 2 (two) times daily.     cholecalciferol (VITAMIN D3) 25 MCG (1000 UNIT) tablet Take 2,000 Units by mouth daily.     dexamethasone (DECADRON) 4 MG tablet Take 0.5 tablets (2 mg total) by mouth daily.     estradiol (ESTRACE) 0.1 MG/GM vaginal cream PLACE FINGER TIP SIZE AMOUNT OF CREAM AND INSERT SLIGHTLY PAST THE VAGINAL ENTRANCE 3 TIMES DAILY 126 g 4   lidocaine (XYLOCAINE) 2 % solution SMARTSIG:By Mouth     lidocaine-prilocaine (EMLA) cream Apply to affected area once 30 g 3   loratadine (CLARITIN) 10 MG tablet Take 10 mg by mouth daily as needed (for bone aches).     LORazepam (ATIVAN) 0.5 MG tablet Take 1 tablet (0.5 mg total) by mouth 2 (two) times daily as needed for anxiety. 30 tablet 0   magic mouthwash (nystatin, diphenhydrAMINE, alum & mag hydroxide) suspension mixture Swish and spit 5 mLs 4 (four) times daily as needed for mouth pain. 240 mL 0   metoprolol succinate (TOPROL XL) 25 MG 24 hr tablet Take 1 tablet (25 mg total) by mouth at bedtime. 30 tablet 6   ondansetron (ZOFRAN) 8 MG tablet Take 1 tablet (8 mg total) by mouth every 8 (eight) hours as needed. 30 tablet 1  prochlorperazine (COMPAZINE) 10 MG tablet Take 1 tablet (10 mg total) by mouth every 6 (six) hours as needed (Nausea or vomiting). 90 tablet 1   senna (SENOKOT) 8.6 MG TABS tablet Take 2 tablets by mouth at bedtime.     No current facility-administered medications for this encounter.    Physical Findings:  The patient is in no acute distress. Patient is alert and oriented.  weight is 127 lb 7 oz (57.8 kg). Her oral temperature is 98.4 F (36.9 C). Her blood pressure is 143/85 (abnormal) and her pulse is 100. Her respiration is 18 and oxygen saturation is 98%. .     General: Alert and oriented, in no acute distress HEENT: Head is normocephalic. Extraocular movements are intact. Oropharynx is clear. Skin:posterior scalp alopecia. No concerning bony prominences.  Musculoskeletal: symmetric strength and muscle tone throughout.  Neurologic: Cranial nerves II through XII are grossly intact. No obvious focalities. Speech is fluent. Coordination is intact.  Psychiatric: Judgment and insight are intact. Affect is appropriate.    Lab Findings: Lab Results  Component Value Date   WBC 5.7 02/01/2023   HGB 12.3 02/01/2023   HCT 35.7 (L) 02/01/2023   MCV 97.3 02/01/2023   PLT 287 02/01/2023    Radiographic Findings: MR Brain W Wo Contrast  Result Date: 02/21/2023 CLINICAL DATA:  Brain metastases, assess treatment response 3T SRS Protocol EXAM: MRI HEAD WITHOUT AND WITH CONTRAST TECHNIQUE: Multiplanar, multiecho pulse sequences of the brain and surrounding structures were obtained without and with intravenous contrast. CONTRAST:  1.5 mL Vueway IV COMPARISON:  MRI head 11/03/2022. FINDINGS: Brain: Similar versus slightly decreased size of a 1.0 cm peripherally enhancing lesion in the left frontoparietal region. Additional punctate areas of enhancement in the right frontal lobe are no longer confidently seen. No new lesions identified. 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 No evidence of acute infarct, acute hemorrhage, midline shift or hydrocephalus. Vascular: Major arterial flow voids are maintained. Skull and upper cervical spine: Similar except it'll skull metastasis with unchanged associated dural thickening over both occipital poles. Sinuses/Orbits: Largely clear sinuses.  No acute orbital findings. Other: No mastoid effusions. IMPRESSION: 1. 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 Additional punctate areas of enhancement in the right frontal lobe are no longer confidently  seen. No new lesions identified. 2. Similar subtle skull metastasis. Electronically Signed   By: Feliberto Harts M.D.   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 Restaging. * Tracking Code: BO * EXAM: CT CHEST, ABDOMEN, AND PELVIS WITH CONTRAST TECHNIQUE: Multidetector CT imaging of the chest, abdomen and pelvis was performed following the standard protocol during bolus administration of intravenous contrast. RADIATION DOSE REDUCTION: This exam was performed according to the departmental dose-optimization program which includes automated exposure control, adjustment of the mA and/or kV according to patient size and/or use of iterative reconstruction technique. CONTRAST:  OMNIPAQUE IOHEXOL 300 MG/ML  SOLN COMPARISON:  10/22/2022 CT chest, abdomen and pelvis. FINDINGS: CT CHEST FINDINGS Cardiovascular: Normal heart size. No significant pericardial effusion/thickening. Right internal jugular Port-A-Cath terminates at the cavoatrial junction. Great vessels are normal in course and caliber. No central pulmonary emboli. Mediastinum/Nodes: Stable small bilateral thyroid nodules, largest 1.0 cm in the inferior left thyroid lobe. Not clinically significant; no follow-up imaging recommended (ref: J Am Coll Radiol. 2015 Feb;12(2): 143-50). Unremarkable esophagus.  No axillary adenopathy. No pathologically enlarged mediastinal or hilar nodes. Lungs/Pleura: No pneumothorax. Small posterior right pleural effusion is new. No left pleural effusion. Numerous (at least 10) solid pulmonary nodules scattered throughout both lungs, all substantially increased. Representative 2.7 x 2.3 cm medial right upper lobe nodule (series 6/image 40), previously 1.3 x 1.0 cm. Representative 1.1 x 0.8 cm superior segment left lower lobe nodule (series 6/image 58), new. Representative 1.3 cm left lower lobe nodule (series  6/image 86), previously 0.2 cm. Mild passive atelectasis at the right lung base. Musculoskeletal:  No aggressive appearing focal osseous lesions. CT ABDOMEN PELVIS FINDINGS Hepatobiliary: Small hypodense 0.8 cm lesion along the posterior right liver capsule (series 2/image 58), new. Otherwise normal liver. Normal gallbladder with no radiopaque cholelithiasis. No biliary ductal dilatation. Pancreas: Normal, with no mass or duct dilation. Spleen: Normal size. No mass. Adrenals/Urinary Tract: Normal adrenals. Normal kidneys with no hydronephrosis and no renal mass. Normal bladder. Stomach/Bowel: Normal non-distended stomach. Normal caliber small bowel with no small bowel wall thickening. Appendix not discretely visualized. Normal large bowel with no diverticulosis, large bowel wall thickening or pericolonic fat stranding. Vascular/Lymphatic: Atherosclerotic nonaneurysmal abdominal aorta. Patent portal, splenic, hepatic and renal veins. Infiltrative 2.3 cm short axis diameter right external iliac lymph node (series 2/image 97), increased from 1.6 cm. No additional pathologically enlarged lymph nodes in the abdomen or pelvis. Reproductive: Hysterectomy. Other: No pneumoperitoneum. Small volume ascites is increased. Innumerable bulky peritoneal masses and nodules scattered throughout the peritoneal cavity, substantially increased. Representative 7.3 x 5.1 cm irregular anterior midline peritoneal mass (series 2/image 82), probably correlating to the previous 1.7 x 1.2 cm nodule. Right omental 4.7 x 2.9 cm peritoneal mass (series 2/image 83), previously 1.0 x 0.9 cm. High left upper quadrant anterior peritoneal 1.9 x 1.6 cm mass (series 2/image 56), new. Left deep pelvic 2.8 x 2.4 cm peritoneal mass (series 2/image 110), previously 0.7 x 0.6 cm. Musculoskeletal: Chronic mild L3 vertebral pathologic fracture status post vertebroplasty. No new focal osseous lesions. Mild lumbar spondylosis. IMPRESSION: 1. Substantial interval  progression of widespread bilateral pulmonary metastases. 2. Substantial interval progression of widespread bulky peritoneal carcinomatosis. Small volume malignant ascites is increased. 3. Infiltrative 2.3 cm short axis diameter right external iliac nodal metastasis, increased. 4. Small posterior right pleural effusion, new. 5.  Aortic Atherosclerosis (ICD10-I70.0). Electronically Signed   By: Delbert Phenix M.D.   On: 02/14/2023 10:12    Impression/Plan:  She is recovering well from [facility] to the brain.  Most recent MRI shows good response to [facility] therapy with no evidence of disease progression. Dr. Basilio Cairo personally reviewed these images with the patient. Patient and her husband were very pleased to hear this.   Unfortunately, her most recent CT of the pelvis did show disease progression. She will be starting new systemic treatment at [facility].   We will get a MRI of the brain in 3 months with a follow up appointment with Dr. Wynetta Emery to review the results. We will see patient in 6 months to review her next MRI. She knows to call back with any questions or concerns in the meantime.   On date of service, in total, I spent 25 minutes on this encounter. Patient was seen in person.  _____________________________________   Joyice Faster, PA-C    Lonie Peak, MD

## 2023-02-25 ENCOUNTER — Other Ambulatory Visit: Payer: Self-pay | Admitting: Radiation Therapy

## 2023-02-25 DIAGNOSIS — C7931 Secondary malignant neoplasm of brain: Secondary | ICD-10-CM

## 2023-03-19 IMAGING — CT NM PET TUM IMG INITIAL (PI) SKULL BASE T - THIGH
9 series · 25 of 25 positions shown · non-contrast
Comparison: MR pelvis 06/22/2021 and CT abdomen pelvis 06/11/2021.

CLINICAL DATA: Initial treatment strategy for uterine/cervical
cancer.

EXAM:
NUCLEAR MEDICINE PET SKULL BASE TO THIGH
TECHNIQUE: 7.8 mCi F-18 FDG was injected intravenously. Full-ring PET imaging
was performed from the skull base to thigh after the radiotracer. CT
data was obtained and used for attenuation correction and anatomic
localization.
Fasting blood glucose: 80 mg/dl

[Series 3: ct wb 5.0 b30f · axial · 5.0mm · 0.98mm/px · z∈[-235,+632]mm · 3 of 290 slices shown]
[im 1/290]
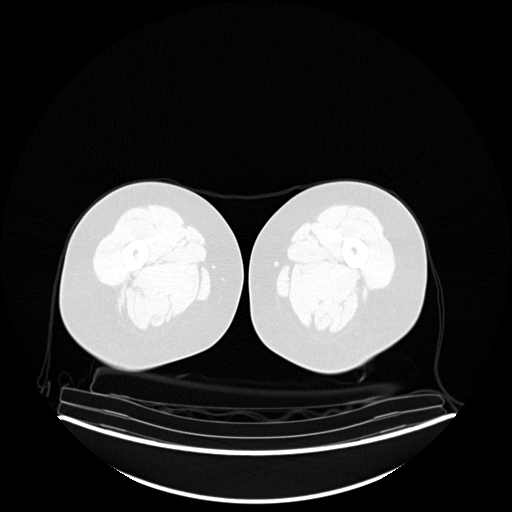
[im 145/290]
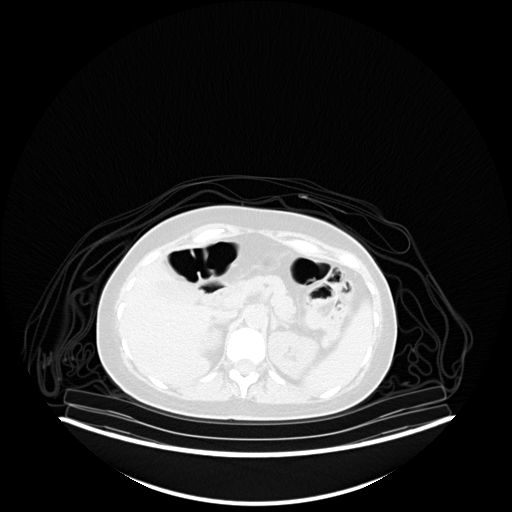
[im 290/290  brain]
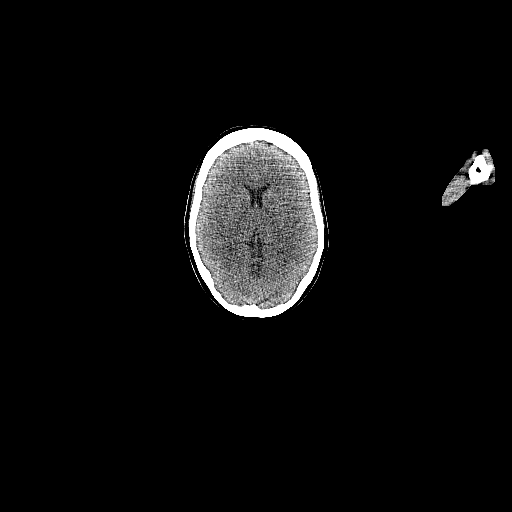

[Series 5: pet wb uncorrected (nac) · axial · 5.0mm · 4.07mm/px · z∈[-235,+632]mm · 4 of 290 slices shown]
[im 1/290]
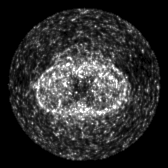
[im 97/290]
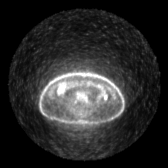
[im 193/290]
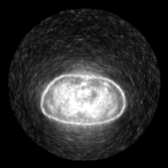
[im 290/290]
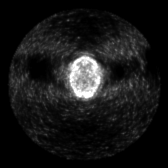

[Series 6: pet wb (ac) · axial · 5.0mm · 3.13mm/px · z∈[-235,+632]mm · 4 of 290 slices shown]
[im 1/290]
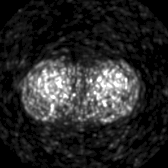
[im 97/290]
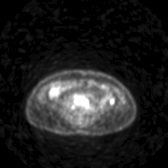
[im 193/290]
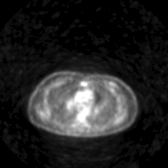
[im 290/290]
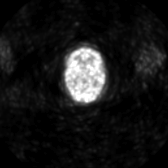

[Series 603: pet_ct axial fused · 4 of 285 slices shown]
[im 1/285]
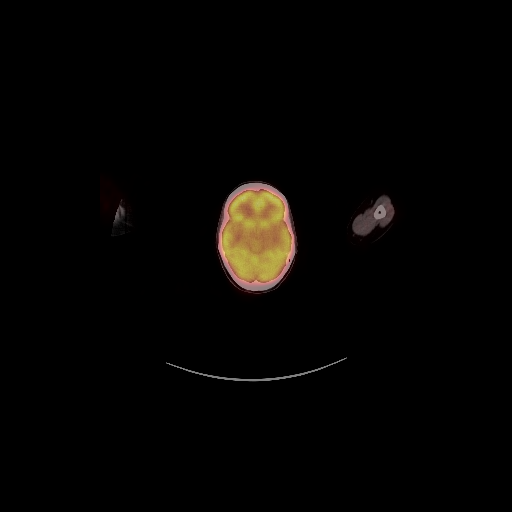
[im 95/285]
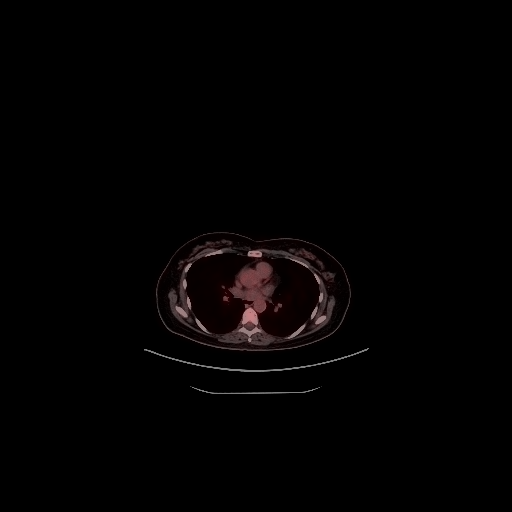
[im 190/285]
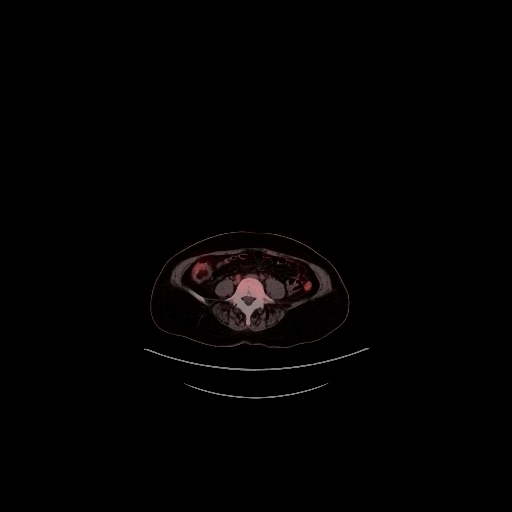
[im 285/285]
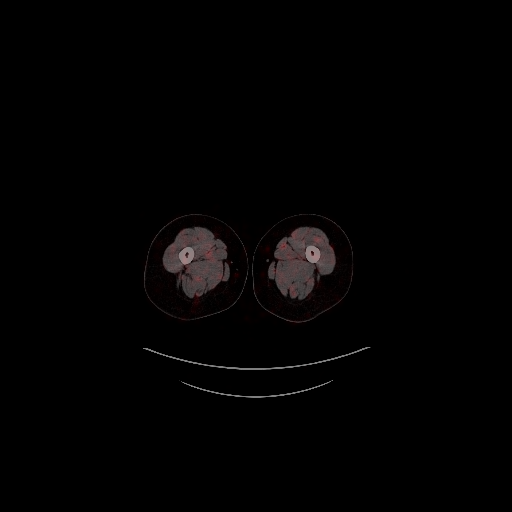

[Series 604: pet_ct coronal fused · 1 of 98 slices shown]
[im 1/98]
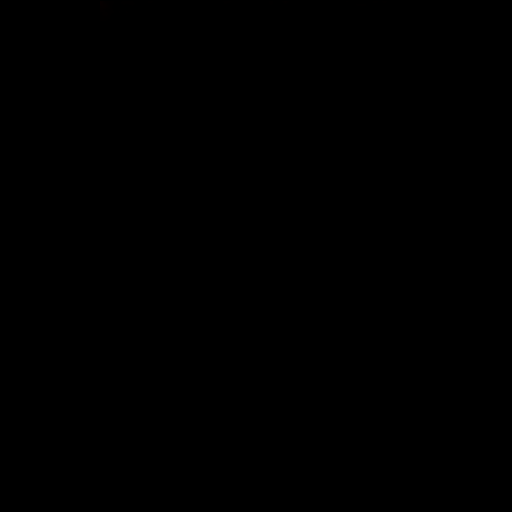

[Series 605: pet_ct sagittal fused · 2 of 175 slices shown]
[im 1/175]
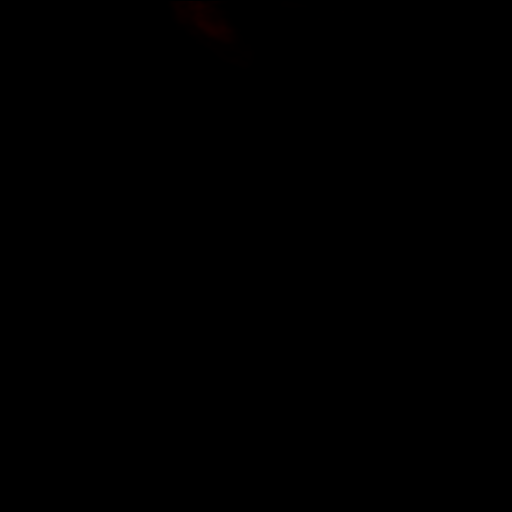
[im 175/175]
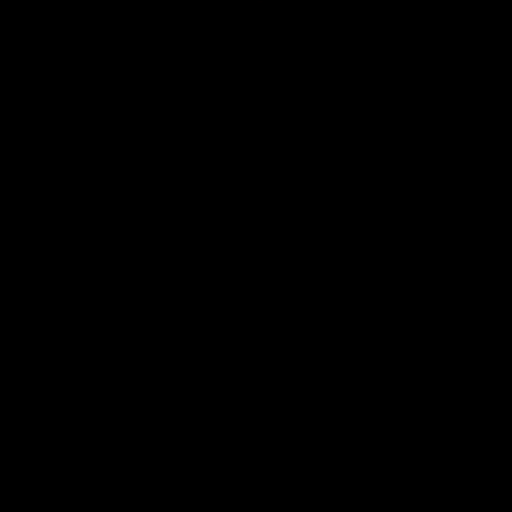

[Series 606: pet axial · 4 of 285 slices shown]
[im 1/285]
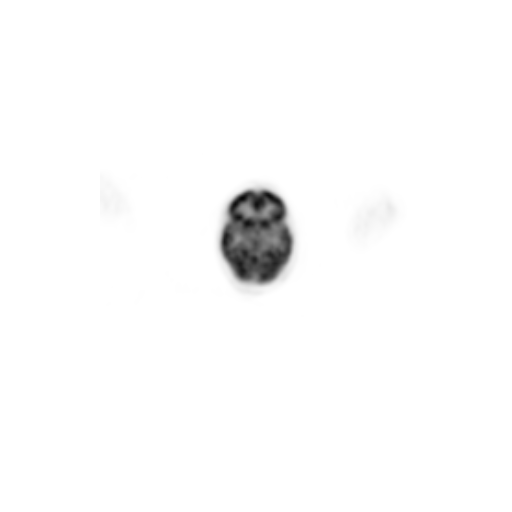
[im 95/285]
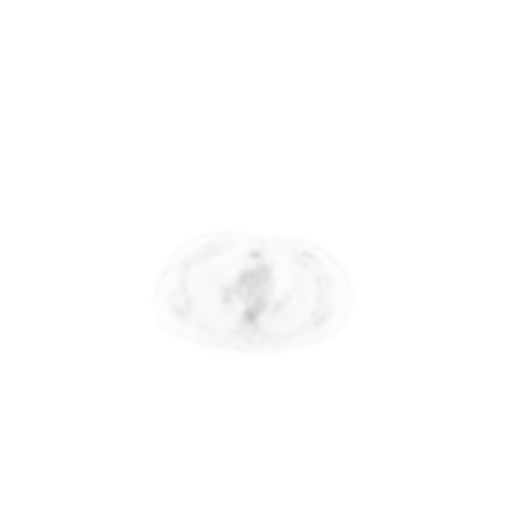
[im 190/285]
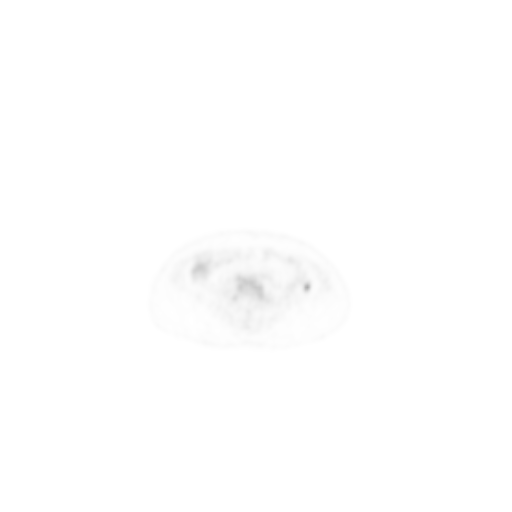
[im 285/285]
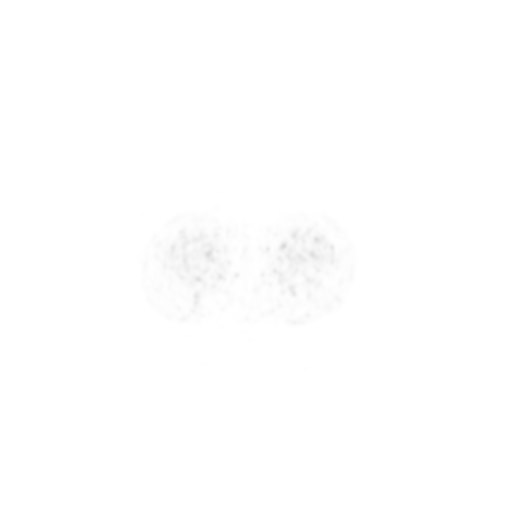

[Series 607: pet coronal · 1 of 109 slices shown]
[im 1/109]
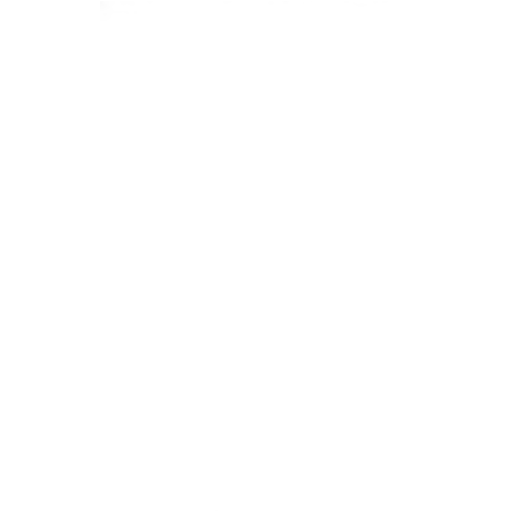

[Series 608: pet sagittal · 2 of 174 slices shown]
[im 1/174]
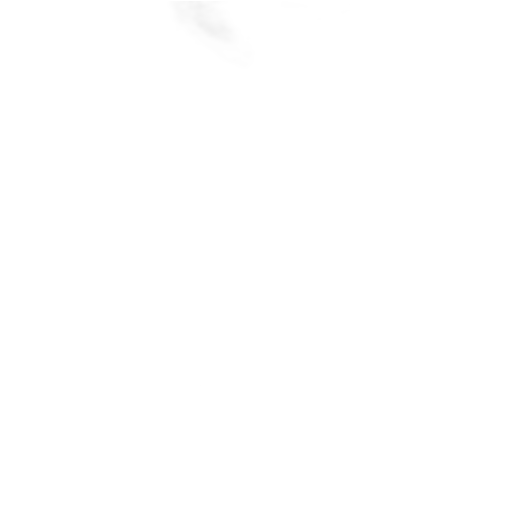
[im 174/174]
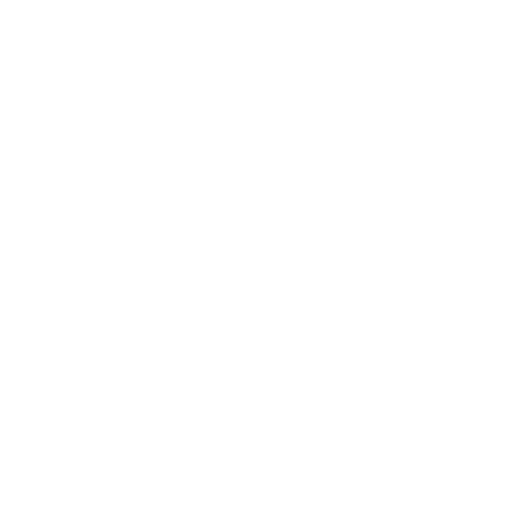

[25 of 25 positions shown; findings below may reference images not displayed]

FINDINGS: Mediastinal blood pool activity: SUV max

Liver activity: SUV max NA

NECK:

No abnormal hypermetabolism.

Incidental CT findings:

None.

CHEST:

No hypermetabolic mediastinal, hilar or axillary lymph nodes. No
hypermetabolic pulmonary nodules.

Incidental CT findings:

1.5 cm low-attenuation left thyroid nodule. Heart is at the upper
limits of normal in size. No pericardial or pleural effusion. A few
scattered millimetric pulmonary nodules are too small for PET
resolution.

ABDOMEN/PELVIS:

No abnormal hypermetabolism in the liver, adrenal glands, spleen or
pancreas. Centrally necrotic and peripherally hypermetabolic
uterine/cervical mass has an SUV max 14.6. Left external iliac lymph
node measures 1.3 cm (3/222) with an SUV max 7.5. Right external
iliac lymph node measures 10 mm (3/217) with an SUV max 5.8. No
additional hypermetabolic lymph nodes.

Incidental CT findings:

Liver, gallbladder, adrenal glands, kidneys, spleen, pancreas,
stomach and bowel are grossly unremarkable.

SKELETON:

No abnormal hypermetabolism.

Incidental CT findings:

Mild degenerative changes in the spine.
IMPRESSION: 1. Hypermetabolic necrotic cervical/uterine mass with bilateral
external iliac hypermetabolic lymph nodes. No evidence of distant
metastatic disease.
2. 1.5 cm low-attenuation left thyroid nodule. Recommend thyroid
ultrasound. (Ref: [HOSPITAL]. [DATE]): 143-50).

## 2023-03-27 ENCOUNTER — Encounter (HOSPITAL_COMMUNITY): Payer: Self-pay

## 2023-03-28 ENCOUNTER — Other Ambulatory Visit: Payer: Self-pay

## 2023-03-28 ENCOUNTER — Inpatient Hospital Stay (HOSPITAL_COMMUNITY): Payer: BC Managed Care – PPO

## 2023-03-28 ENCOUNTER — Inpatient Hospital Stay (HOSPITAL_COMMUNITY)
Admit: 2023-03-28 | Discharge: 2023-04-04 | DRG: 175 | Disposition: A | Discharge status: Home Health | Payer: BC Managed Care – PPO | Source: Other Acute Inpatient Hospital | Attending: Internal Medicine | Admitting: Internal Medicine

## 2023-03-28 ENCOUNTER — Encounter (HOSPITAL_COMMUNITY): Payer: Self-pay | Admitting: Internal Medicine

## 2023-03-28 DIAGNOSIS — C7800 Secondary malignant neoplasm of unspecified lung: Secondary | ICD-10-CM | POA: Diagnosis present

## 2023-03-28 DIAGNOSIS — J910 Malignant pleural effusion: Secondary | ICD-10-CM | POA: Diagnosis present

## 2023-03-28 DIAGNOSIS — D630 Anemia in neoplastic disease: Secondary | ICD-10-CM | POA: Diagnosis present

## 2023-03-28 DIAGNOSIS — I2699 Other pulmonary embolism without acute cor pulmonale: Secondary | ICD-10-CM | POA: Diagnosis not present

## 2023-03-28 DIAGNOSIS — E440 Moderate protein-calorie malnutrition: Secondary | ICD-10-CM | POA: Insufficient documentation

## 2023-03-28 DIAGNOSIS — E860 Dehydration: Secondary | ICD-10-CM | POA: Diagnosis present

## 2023-03-28 DIAGNOSIS — Z681 Body mass index (BMI) 19 or less, adult: Secondary | ICD-10-CM

## 2023-03-28 DIAGNOSIS — C7951 Secondary malignant neoplasm of bone: Secondary | ICD-10-CM | POA: Diagnosis present

## 2023-03-28 DIAGNOSIS — R4589 Other symptoms and signs involving emotional state: Secondary | ICD-10-CM

## 2023-03-28 DIAGNOSIS — Z7189 Other specified counseling: Secondary | ICD-10-CM | POA: Diagnosis not present

## 2023-03-28 DIAGNOSIS — N858 Other specified noninflammatory disorders of uterus: Principal | ICD-10-CM

## 2023-03-28 DIAGNOSIS — D649 Anemia, unspecified: Secondary | ICD-10-CM | POA: Diagnosis not present

## 2023-03-28 DIAGNOSIS — I2694 Multiple subsegmental pulmonary emboli without acute cor pulmonale: Secondary | ICD-10-CM

## 2023-03-28 DIAGNOSIS — T451X5A Adverse effect of antineoplastic and immunosuppressive drugs, initial encounter: Secondary | ICD-10-CM | POA: Diagnosis present

## 2023-03-28 DIAGNOSIS — J9601 Acute respiratory failure with hypoxia: Secondary | ICD-10-CM | POA: Diagnosis present

## 2023-03-28 DIAGNOSIS — F419 Anxiety disorder, unspecified: Secondary | ICD-10-CM | POA: Diagnosis present

## 2023-03-28 DIAGNOSIS — R0602 Shortness of breath: Secondary | ICD-10-CM | POA: Diagnosis not present

## 2023-03-28 DIAGNOSIS — D6481 Anemia due to antineoplastic chemotherapy: Secondary | ICD-10-CM | POA: Diagnosis present

## 2023-03-28 DIAGNOSIS — R64 Cachexia: Secondary | ICD-10-CM | POA: Diagnosis present

## 2023-03-28 DIAGNOSIS — C55 Malignant neoplasm of uterus, part unspecified: Secondary | ICD-10-CM | POA: Diagnosis present

## 2023-03-28 DIAGNOSIS — E876 Hypokalemia: Secondary | ICD-10-CM | POA: Diagnosis present

## 2023-03-28 DIAGNOSIS — Y929 Unspecified place or not applicable: Secondary | ICD-10-CM | POA: Diagnosis not present

## 2023-03-28 DIAGNOSIS — N3090 Cystitis, unspecified without hematuria: Secondary | ICD-10-CM | POA: Diagnosis present

## 2023-03-28 DIAGNOSIS — E43 Unspecified severe protein-calorie malnutrition: Secondary | ICD-10-CM | POA: Diagnosis present

## 2023-03-28 DIAGNOSIS — M8580 Other specified disorders of bone density and structure, unspecified site: Secondary | ICD-10-CM | POA: Diagnosis present

## 2023-03-28 DIAGNOSIS — C7931 Secondary malignant neoplasm of brain: Secondary | ICD-10-CM | POA: Diagnosis present

## 2023-03-28 DIAGNOSIS — Z881 Allergy status to other antibiotic agents status: Secondary | ICD-10-CM | POA: Diagnosis not present

## 2023-03-28 DIAGNOSIS — Z923 Personal history of irradiation: Secondary | ICD-10-CM | POA: Diagnosis not present

## 2023-03-28 DIAGNOSIS — Z515 Encounter for palliative care: Secondary | ICD-10-CM

## 2023-03-28 DIAGNOSIS — I2609 Other pulmonary embolism with acute cor pulmonale: Secondary | ICD-10-CM | POA: Diagnosis not present

## 2023-03-28 DIAGNOSIS — Z79899 Other long term (current) drug therapy: Secondary | ICD-10-CM

## 2023-03-28 DIAGNOSIS — R000 Tachycardia, unspecified: Secondary | ICD-10-CM | POA: Diagnosis present

## 2023-03-28 DIAGNOSIS — J9811 Atelectasis: Secondary | ICD-10-CM | POA: Diagnosis present

## 2023-03-28 DIAGNOSIS — C799 Secondary malignant neoplasm of unspecified site: Secondary | ICD-10-CM | POA: Diagnosis not present

## 2023-03-28 DIAGNOSIS — Z8249 Family history of ischemic heart disease and other diseases of the circulatory system: Secondary | ICD-10-CM | POA: Diagnosis not present

## 2023-03-28 DIAGNOSIS — J90 Pleural effusion, not elsewhere classified: Secondary | ICD-10-CM | POA: Diagnosis not present

## 2023-03-28 LAB — COMPREHENSIVE METABOLIC PANEL WITH GFR
ALT: 11 U/L (ref 0–44)
AST: 15 U/L (ref 15–41)
Albumin: 2.5 g/dL — ABNORMAL LOW (ref 3.5–5.0)
Alkaline Phosphatase: 62 U/L (ref 38–126)
Anion gap: 12 (ref 5–15)
BUN: 7 mg/dL (ref 6–20)
CO2: 22 mmol/L (ref 22–32)
Calcium: 8 mg/dL — ABNORMAL LOW (ref 8.9–10.3)
Chloride: 103 mmol/L (ref 98–111)
Creatinine, Ser: 0.67 mg/dL (ref 0.44–1.00)
GFR, Estimated: >60 mL/min
Glucose, Bld: 98 mg/dL (ref 70–99)
Potassium: 3.5 mmol/L (ref 3.5–5.1)
Sodium: 137 mmol/L (ref 135–145)
Total Bilirubin: 0.8 mg/dL (ref 0.3–1.2)
Total Protein: 5.4 g/dL — ABNORMAL LOW (ref 6.5–8.1)

## 2023-03-28 LAB — CBC
HCT: 30.7 % — ABNORMAL LOW (ref 36.0–46.0)
Hemoglobin: 9.5 g/dL — ABNORMAL LOW (ref 12.0–15.0)
MCH: 30.2 pg (ref 26.0–34.0)
MCHC: 30.9 g/dL (ref 30.0–36.0)
MCV: 97.5 fL (ref 80.0–100.0)
Platelets: 218 10*3/uL (ref 150–400)
RBC: 3.15 MIL/uL — ABNORMAL LOW (ref 3.87–5.11)
RDW: 13.1 % (ref 11.5–15.5)
WBC: 8.4 10*3/uL (ref 4.0–10.5)
nRBC: 0 % (ref 0.0–0.2)

## 2023-03-28 LAB — BODY FLUID CELL COUNT WITH DIFFERENTIAL
Lymphs, Fluid: 16 %
Monocyte-Macrophage-Serous Fluid: 78 % (ref 50–90)
Neutrophil Count, Fluid: 6 % (ref 0–25)
Total Nucleated Cell Count, Fluid: 250 cu mm (ref 0–1000)

## 2023-03-28 LAB — GLUCOSE, PLEURAL OR PERITONEAL FLUID: Glucose, Fluid: 103 mg/dL

## 2023-03-28 LAB — MAGNESIUM: Magnesium: 1.7 mg/dL (ref 1.7–2.4)

## 2023-03-28 LAB — PHOSPHORUS: Phosphorus: 3.7 mg/dL (ref 2.5–4.6)

## 2023-03-28 LAB — HEPARIN LEVEL (UNFRACTIONATED): Heparin Unfractionated: 0.6 IU/mL (ref 0.30–0.70)

## 2023-03-28 MED ORDER — ACETAMINOPHEN 325 MG PO TABS
650.0000 mg | ORAL_TABLET | Freq: Four times a day (QID) | ORAL | Status: DC | PRN
  Filled 2023-03-28 (×2): qty 2

## 2023-03-28 MED ORDER — FUROSEMIDE 10 MG/ML IJ SOLN
40.0000 mg | Freq: Once | INTRAMUSCULAR | Status: AC
  Administered 2023-03-28: 40 mg via INTRAVENOUS
  Filled 2023-03-28: qty 4

## 2023-03-28 MED ORDER — GUAIFENESIN-DM 100-10 MG/5ML PO SYRP
10.0000 mL | ORAL_SOLUTION | ORAL | Status: DC | PRN
  Administered 2023-03-28: 10 mL via ORAL
  Filled 2023-03-28: qty 10

## 2023-03-28 MED ORDER — ENSURE ENLIVE PO LIQD: 237.0000 mL | Freq: Two times a day (BID) | ORAL | Status: DC

## 2023-03-28 MED ORDER — MAGNESIUM SULFATE IN D5W 1-5 GM/100ML-% IV SOLN
1.0000 g | Freq: Once | INTRAVENOUS | Status: AC
  Administered 2023-03-28: 1 g via INTRAVENOUS
  Filled 2023-03-28: qty 100

## 2023-03-28 MED ORDER — SODIUM CHLORIDE 0.9 % IV SOLN: INTRAVENOUS | Status: DC | PRN

## 2023-03-28 MED ORDER — CHLORHEXIDINE GLUCONATE CLOTH 2 % EX PADS
6.0000 | MEDICATED_PAD | Freq: Every day | CUTANEOUS | Status: DC
  Administered 2023-03-28 – 2023-04-02 (×4): 6 via TOPICAL

## 2023-03-28 MED ORDER — METOPROLOL TARTRATE 5 MG/5ML IV SOLN
5.0000 mg | Freq: Once | INTRAVENOUS | Status: AC
  Administered 2023-03-28: 5 mg via INTRAVENOUS
  Filled 2023-03-28: qty 5

## 2023-03-28 MED ORDER — PANTOPRAZOLE SODIUM 40 MG PO TBEC
40.0000 mg | DELAYED_RELEASE_TABLET | Freq: Every day | ORAL | Status: DC
  Administered 2023-03-28 – 2023-04-04 (×8): 40 mg via ORAL
  Filled 2023-03-28 (×8): qty 1

## 2023-03-28 MED ORDER — ONDANSETRON HCL 4 MG PO TABS
4.0000 mg | ORAL_TABLET | Freq: Four times a day (QID) | ORAL | Status: DC | PRN
  Administered 2023-03-28: 4 mg via ORAL
  Filled 2023-03-28: qty 1

## 2023-03-28 MED ORDER — METOPROLOL SUCCINATE ER 25 MG PO TB24
25.0000 mg | ORAL_TABLET | Freq: Every day | ORAL | Status: DC
  Administered 2023-03-28 – 2023-04-03 (×6): 25 mg via ORAL
  Filled 2023-03-28 (×7): qty 1

## 2023-03-28 MED ORDER — SENNA 8.6 MG PO TABS: 2.0000 | ORAL_TABLET | ORAL | Status: DC | PRN

## 2023-03-28 MED ORDER — ONDANSETRON HCL 4 MG/2ML IJ SOLN
4.0000 mg | Freq: Four times a day (QID) | INTRAMUSCULAR | Status: DC | PRN
  Administered 2023-04-02 – 2023-04-04 (×4): 4 mg via INTRAVENOUS
  Filled 2023-03-28 (×4): qty 2

## 2023-03-28 MED ORDER — ACETAMINOPHEN 650 MG RE SUPP: 650.0000 mg | Freq: Four times a day (QID) | RECTAL | Status: DC | PRN

## 2023-03-28 MED ORDER — ORAL CARE MOUTH RINSE: 15.0000 mL | OROMUCOSAL | Status: DC | PRN

## 2023-03-28 MED ORDER — LIDOCAINE HCL 1 % IJ SOLN
INTRAMUSCULAR | Status: AC
  Filled 2023-03-28: qty 20

## 2023-03-28 MED ORDER — POTASSIUM CHLORIDE CRYS ER 20 MEQ PO TBCR
20.0000 meq | EXTENDED_RELEASE_TABLET | Freq: Once | ORAL | Status: AC
  Administered 2023-03-28: 20 meq via ORAL
  Filled 2023-03-28: qty 1

## 2023-03-28 MED ORDER — HEPARIN (PORCINE) 25000 UT/250ML-% IV SOLN
1150.0000 [IU]/h | INTRAVENOUS | Status: DC
  Administered 2023-03-28: 1050 [IU]/h via INTRAVENOUS
  Administered 2023-03-29: 1150 [IU]/h via INTRAVENOUS
  Filled 2023-03-28 (×2): qty 250

## 2023-03-28 MED ORDER — ALBUMIN HUMAN 25 % IV SOLN
25.0000 g | Freq: Once | INTRAVENOUS | Status: AC
  Administered 2023-03-28: 25 g via INTRAVENOUS
  Filled 2023-03-28: qty 100

## 2023-03-28 MED ORDER — SODIUM CHLORIDE 0.9 % IV BOLUS
1000.0000 mL | Freq: Once | INTRAVENOUS | Status: AC
  Administered 2023-03-28: 1000 mL via INTRAVENOUS

## 2023-03-28 MED ORDER — SODIUM CHLORIDE 0.9 % IV SOLN
1.0000 g | INTRAVENOUS | Status: AC
  Administered 2023-03-28 – 2023-03-30 (×3): 1 g via INTRAVENOUS
  Filled 2023-03-28 (×3): qty 10

## 2023-03-28 NOTE — Progress Notes (Signed)
  Echocardiogram 2D Echocardiogram attempted at 1505. Pt is being transported for a procedure at 1515. We will attempt again on 06/18.  Milda Smart 03/28/2023, 3:07 PM

## 2023-03-28 NOTE — Progress Notes (Signed)
ANTICOAGULATION CONSULT NOTE - Initial Consult  Pharmacy Consult for heparin Indication: pulmonary embolus  Allergies  Allergen Reactions   Doxycycline Nausea Only and Other (See Comments)    Dizziness    Patient Measurements: Height: 5\' 8"  (172.7 cm) Weight: 56.9 kg (125 lb 7.1 oz) IBW/kg (Calculated) : 63.9 Heparin Dosing Weight: 56.9 kg  Vital Signs: Temp: 98.5 F (36.9 C) (06/17 1145) Temp Source: Oral (06/17 1145) BP: 159/96 (06/17 1300) Pulse Rate: 118 (06/17 1300)  Labs: Recent Labs    03/28/23 1245 03/28/23 1315  HGB  --  9.5*  HCT  --  30.7*  PLT  --  218  CREATININE 0.67  --     Estimated Creatinine Clearance: 70.5 mL/min (by C-G formula based on SCr of 0.67 mg/dL).   Medical History: Past Medical History:  Diagnosis Date   History of radiation therapy    Lumbar Spine- 03/10/22-03/23/22- Dr. Antony Blackbird   History of radiation therapy    SBRT to iliac nodes; 09/14/22-09/24/22 Dr. Antony Blackbird   Hypoglycemia    occasional episodes of hypoglycemia   IBS (irritable bowel syndrome)    Pathologic fracture of lumbar vertebra 02/04/2022     Assessment: 57 year old female presented to [facility] with shortness of breath. 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 Since then she reports SOB and reports that she was told that was a side effect of the new treatment. Imaging at outside hospital positive for pulmonary artery embolus in the left upper and lower lobes with no evidence of right heart strain. She was started on heparin - per report from transport, she is on heparin infusion at 10.6 ml/hr. Pharmacy consulted to manage heparin infusion.  Per med history, no anticoagulation noted PTA. Her hemoglobin here is down from previous collection at [facility].  Goal of Therapy:  Heparin level 0.3-0.7 units/ml Monitor platelets by anticoagulation protocol: Yes   Plan:   -Continue heparin - change rate to 1050 units/hr to accommodate pump here -Check heparin level at 2000 (6 hrs after change in rate here) -Daily CBC while on heparin infusion - watch Hgb -Monitor closely for new or worsening bleeding   Pricilla Riffle, PharmD, BCPS Clinical Pharmacist 03/28/2023 2:13 PM

## 2023-03-28 NOTE — Progress Notes (Signed)
Bilateral lower extremity venous duplex has been completed. Preliminary results can be found in CV Proc through chart review.   03/28/23 2:16 PM Olen Cordial RVT

## 2023-03-28 NOTE — H&P (Signed)
History and Physical    Patient: Tammie Gilmore ZOX:[id number] DOB: 09/16/1966 DOA: 03/28/2023 DOS: the patient was seen and examined on 03/28/2023 PCP: Juliette Alcide, MD  Patient coming from: Home  Chief Complaint: Shortness of breath.  HPI: Tammie Gilmore is a 57 y.o. 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 Bertis Ruddy, pathological fracture, hemorrhoids, acquired pancytopenia, peripheral neuropathy from chemotherapy, osteopenia, anxiety who was transferred from [facility] after presenting there with progressively worse dyspnea for the past 3 weeks.  Yesterday, she checked her pulse ox, it was in the 80s and her heart rate was in the 140s so she went to the emergency department.  She was diagnosed with a large right-sided pleural effusion and left-sided pulmonary embolism.  She was started on a heparin infusion.  No significant lower extremity edema.  She was transferred to this facility due to lack of IR service at the originating facility.  No prolonged immobilization or travel history.  She denied fever, chills, rhinorrhea, sore throat, wheezing or hemoptysis.  No chest pain, palpitations, diaphoresis, PND, orthopnea or pitting edema of the lower extremities.  No abdominal pain, nausea, emesis, diarrhea, constipation, melena or hematochezia.  No flank pain, dysuria,  or hematuria but has been having frequency.  No polyuria, polydipsia, polyphagia or blurred vision.   ED course: Her initial vital signs were temperature 98.6 F, pulse 140, respirations 16, BP 145/110 mmHg and O2 sat 94%.  Besides heparin infusion, she was also given Zofran 4 mg IVP, 1000 mL of LR bolus, ceftriaxone due to abnormal urinalysis and complaints of frequency. Please see [facility] transfer package for further details.                 Review of Systems: As mentioned in the history of present illness. All other systems reviewed and are  negative. Past Medical History:  Diagnosis Date   History of radiation therapy    Lumbar Spine- 03/10/22-03/23/22- Dr. Antony Blackbird   History of radiation therapy    SBRT to iliac nodes; 09/14/22-09/24/22 Dr. Antony Blackbird   Hypoglycemia    occasional episodes of hypoglycemia   IBS (irritable bowel syndrome)    Past Surgical History:  Procedure Laterality Date   HERNIA REPAIR  1994   left inguinal, with mesh   IR BONE TUMOR(S)RF ABLATION  08/26/2022   IR IMAGING GUIDED PORT INSERTION  08/13/2021   IR KYPHO LUMBAR INC FX REDUCE BONE BX UNI/BIL CANNULATION INC/IMAGING  08/26/2022   IR RADIOLOGIST EVAL & MGMT  08/03/2022   IR RADIOLOGIST EVAL & MGMT  09/09/2022   Social History:  reports that she has never smoked. She has never used smokeless tobacco. She reports that she does not drink alcohol and does not use drugs.  Allergies  Allergen Reactions   Doxycycline Nausea Only and Other (See Comments)    Dizziness    Family History  Problem Relation Age of Onset   Heart attack Mother    Heart disease Mother    Endometriosis Mother    Heart disease Father    Heart attack Father    Cancer - Colon Neg Hx    Breast cancer Neg Hx    Cancer Neg Hx    Ovarian cancer Neg Hx    Uterine cancer Neg Hx    Pancreatic cancer Neg Hx    Pancreatic disease Neg Hx    Prostate cancer Neg Hx     Prior to Admission medications  Medication Sig Start Date End Date Taking? Authorizing Provider  acetaminophen (TYLENOL) 500 MG tablet Take 1,000 mg by mouth every 6 (six) hours as needed for moderate pain or headache.   Yes [provider]  albuterol (VENTOLIN HFA) 108 (90 Base) MCG/ACT inhaler Inhale 2 puffs into the lungs every 4 (four) hours as needed for wheezing or shortness of breath.   Yes [provider]  calcium carbonate (TUMS - DOSED IN MG ELEMENTAL CALCIUM) 500 MG chewable tablet Chew 1,000 mg by mouth as needed for heartburn or indigestion.   Yes [provider]   lidocaine-prilocaine (EMLA) cream Apply to affected area once Patient taking differently: Apply 1 Application topically as needed (port access). 05/31/22  Yes Gorsuch, Ni, MD  loratadine (CLARITIN) 10 MG tablet Take 10 mg by mouth daily as needed for allergies.   Yes [provider]  metoprolol succinate (TOPROL XL) 25 MG 24 hr tablet Take 1 tablet (25 mg total) by mouth at bedtime. 09/28/22  Yes Gorsuch, Ni, MD  ondansetron (ZOFRAN) 8 MG tablet Take 1 tablet (8 mg total) by mouth every 8 (eight) hours as needed. Patient taking differently: Take 8 mg by mouth every 8 (eight) hours as needed for nausea or vomiting. 08/17/21  Yes Artis Delay, MD  prochlorperazine (COMPAZINE) 10 MG tablet Take 1 tablet (10 mg total) by mouth every 6 (six) hours as needed (Nausea or vomiting). 09/29/21  Yes Artis Delay, MD  senna (SENOKOT) 8.6 MG TABS tablet Take 2 tablets by mouth as needed for mild constipation or moderate constipation.   Yes [provider]  dexamethasone (DECADRON) 4 MG tablet Take 0.5 tablets (2 mg total) by mouth daily. Patient not taking: Reported on 03/28/2023 10/28/22   Artis Delay, MD  estradiol (ESTRACE) 0.1 MG/GM vaginal cream PLACE FINGER TIP SIZE AMOUNT OF CREAM AND INSERT SLIGHTLY PAST THE VAGINAL ENTRANCE 3 TIMES DAILY Patient not taking: Reported on 03/28/2023 04/09/22   Artis Delay, MD  magic mouthwash (nystatin, diphenhydrAMINE, alum & mag hydroxide) suspension mixture Swish and spit 5 mLs 4 (four) times daily as needed for mouth pain. Patient not taking: Reported on 03/28/2023 08/10/22   Artis Delay, MD  OLANZapine (ZYPREXA) 5 MG tablet Take by mouth. Patient not taking: Reported on 03/28/2023 03/09/23 03/08/24  [provider]  pantoprazole (PROTONIX) 20 MG tablet Take by mouth. Patient not taking: Reported on 03/28/2023 03/10/23 03/30/23  [provider]    Physical Exam: Vitals:   03/28/23 1145 03/28/23 1200  BP: (!) 152/87 (!) 143/94  Pulse: (!)  123 (!) 122  Resp: (!) 33 (!) 27  Temp: 98.5 F (36.9 C)   TempSrc: Oral   SpO2: 96% 96%  Weight: 56.9 kg   Height: 5\' 8"  (1.727 m)    Physical Exam Vitals and nursing note reviewed.  Constitutional:      General: She is awake. She is not in acute distress.    Appearance: Normal appearance. She is ill-appearing.  HENT:     Head: Normocephalic.     Nose: No rhinorrhea.     Mouth/Throat:     Mouth: Mucous membranes are dry.  Eyes:     General: No scleral icterus.    Pupils: Pupils are equal, round, and reactive to light.  Neck:     Vascular: No JVD.  Cardiovascular:     Rate and Rhythm: Normal rate and regular rhythm.     Heart sounds: S1 normal and S2 normal.  Pulmonary:  Effort: Pulmonary effort is normal. Tachypnea present. No accessory muscle usage.     Breath sounds: Examination of the right-upper field reveals decreased breath sounds. Examination of the right-middle field reveals decreased breath sounds. Examination of the right-lower field reveals decreased breath sounds. Decreased breath sounds present. No wheezing, rhonchi or rales.  Abdominal:     General: Bowel sounds are normal. There is no distension.     Palpations: Abdomen is soft.     Tenderness: There is no abdominal tenderness. There is no right CVA tenderness, left CVA tenderness or guarding.  Musculoskeletal:     Cervical back: Neck supple.     Right lower leg: No edema.     Left lower leg: No edema.  Skin:    General: Skin is warm and dry.  Neurological:     General: No focal deficit present.     Mental Status: She is alert and oriented to person, place, and time.  Psychiatric:        Mood and Affect: Mood normal.        Behavior: Behavior normal. Behavior is cooperative.     Data Reviewed:  Results are pending, will review when available.  11/05/2022 transthoracic echocardiogram IMPRESSIONS:   1. Left ventricular ejection fraction, by estimation, is 60 to 65%. The  left ventricle has  normal function. The left ventricle has no regional  wall motion abnormalities. Left ventricular diastolic parameters were  normal. The average left ventricular  global longitudinal strain is -15.0 %. The global longitudinal strain is  abnormal.   2. Right ventricular systolic function is normal. The right ventricular  size is normal. Tricuspid regurgitation signal is inadequate for assessing  PA pressure.   3. No evidence of mitral valve regurgitation.   4. The aortic valve is grossly normal. Aortic valve regurgitation is not  visualized.   5. The inferior vena cava is normal in size with greater than 50%  respiratory variability, suggesting right atrial pressure of 3 mmHg.   Assessment and Plan: Principal Problem:   Pulmonary embolism (HCC) Observation/SDU. Supplemental oxygen as needed. Continue heparin infusion. Briefly discussed 6 months of DOAC. Follow-up CBC and chemistry in AM. Obtain echocardiogram.  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 It yielded about 1.6 L of fluid. Follow-up with oncology as scheduled.    Cystitis Continue ceftriaxone 1 g IVPB daily. Follow new UC&S from [facility] on care everywhere tab.    Normocytic anemia Monitor hematocrit and hemoglobin. Transfuse as needed.    Protein-calorie malnutrition, severe (HCC) Protein supplementation. Consider nutritional services evaluation.     Advance Care Planning:   Code Status: Full Code   Consults: IR for US paracentesis.  Family Communication: 3 of her family members are present in the room.  Severity of Illness: The appropriate patient status for this patient is INPATIENT. Inpatient status is judged to be reasonable and necessary in order to provide the required intensity of service to ensure the patient's safety. The patient's presenting  symptoms, physical exam findings, and initial radiographic and laboratory data in the context of their chronic comorbidities is felt to place them at high risk for further clinical deterioration. Furthermore, it is not anticipated that the patient will be medically stable for discharge from the hospital within 2 midnights of admission.   * I certify that at the point of admission it is my  clinical judgment that the patient will require inpatient hospital care spanning beyond 2 midnights from the point of admission due to high intensity of service, high risk for further deterioration and high frequency of surveillance required.*  Author: Bobette Mo, MD 03/28/2023 12:22 PM  For on call review [email or web address].   This document was prepared using Dragon voice recognition software and may contain some unintended transcription errors.

## 2023-03-28 NOTE — Progress Notes (Signed)
ANTICOAGULATION CONSULT NOTE  Pharmacy Consult for heparin Indication: pulmonary embolus  Allergies  Allergen Reactions   Doxycycline Nausea Only and Other (See Comments)    Dizziness    Patient Measurements: Height: 5\' 8"  (172.7 cm) Weight: 56.9 kg (125 lb 7.1 oz) IBW/kg (Calculated) : 63.9 Heparin Dosing Weight: 56.9 kg  Vital Signs: Temp: 98.3 F (36.8 C) (06/17 2000) Temp Source: Oral (06/17 2000) BP: 176/89 (06/17 2100) Pulse Rate: 122 (06/17 2100)  Labs: Recent Labs    03/28/23 1245 03/28/23 1315 03/28/23 2055  HGB  --  9.5*  --   HCT  --  30.7*  --   PLT  --  218  --   HEPARINUNFRC  --   --  0.60  CREATININE 0.67  --   --      Estimated Creatinine Clearance: 70.5 mL/min (by C-G formula based on SCr of 0.67 mg/dL).   Medical History: Past Medical History:  Diagnosis Date   History of radiation therapy    Lumbar Spine- 03/10/22-03/23/22- Dr. Antony Blackbird   History of radiation therapy    SBRT to iliac nodes; 09/14/22-09/24/22 Dr. Antony Blackbird   Hypoglycemia    occasional episodes of hypoglycemia   IBS (irritable bowel syndrome)    Pathologic fracture of lumbar vertebra 02/04/2022     Assessment: 57 year old female presented to [facility] with shortness of breath. 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 Since then she reports SOB and reports that she was told that was a side effect of the new treatment. Imaging at outside hospital positive for pulmonary artery embolus in the left upper and lower lobes with no evidence of right heart strain. She was started on heparin - per report from transport, she is on heparin infusion at 10.6 ml/hr. Pharmacy consulted to manage heparin infusion.  Per med history, no anticoagulation noted PTA. Her hemoglobin here is down from previous collection at [facility].  03/28/2023: Initial heparin level 0.6- therapeutic on IV heparin  1050 units/hr CBC: Hg 9.5, pltc 218 No bleeding or infusion related concerns reported by RN  Goal of Therapy:  Heparin level 0.3-0.7 units/ml Monitor platelets by anticoagulation protocol: Yes   Plan:  -Continue heparin infusion at 1050 units/hr  -Daily heparin level & CBC while on heparin infusion - watch Hgb -Monitor closely for new or worsening bleeding  Junita Push, PharmD, BCPS Clinical Pharmacist 03/28/2023 10:38 PM

## 2023-03-28 NOTE — Progress Notes (Addendum)
       Overnight   NAME: Tammie Gilmore MRN: [id number] DOB : [birth year]    Date of Service   03/28/2023   HPI/Events of Note    Notified by RN for concern over increased oxygen requirement.  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 Bertis Ruddy.  Patient was admitted from [facility] transfer and received 1 L bolus there.  Patient had ultrasound-guided thoracentesis with aspiration of 1.6 L of fluid.  Previous imaging IMPRESSION: 1. Interval reduction in persistent moderate sized partially loculated right-sided pleural effusion post thoracentesis. No pneumothorax. 2. Improved aeration of the right lung with residual right mid and lower lung atelectasis/collapse. 3. Redemonstrated known bilateral pulmonary metastases.     Electronically Signed   By: Simonne Come M.D.   On: 03/28/2023 16:59 ========================================== Follow-up imaging 2123 Hrs.  IMPRESSION: 1. Bilateral pulmonary nodules corresponding to history of metastatic disease. 2. At least moderate sized right pleural effusion which may be redistributed or slightly increased since exam earlier today. Diffuse ground-glass opacity in the right thorax appears worsened and may be due to asymmetric edema.     Electronically Signed   By: Jasmine Pang M.D.   On: 03/28/2023 21:37   Interventions/ Plan   Lasix 40 mg IV now BiPAP as needed Reassessment ongoing      Update: 2310 hrs Some improvement with Lasix and O2 adjustment by RT Patient states improvement after Lasix  RR rate from 42 +/- to 22 now.  Consider Neb treatments. Consider BiPAP if increased Work of breathing.  Chinita Greenland BSN MSNA MSN ACNPC-AG Acute Care Nurse Practitioner [facility]

## 2023-03-28 NOTE — Procedures (Signed)
Ultrasound-guided diagnostic and therapeutic right thoracentesis performed yielding 1.6 liters of clear, yellow fluid. No immediate complications. Follow-up chest x-ray pending. The fluid was sent to the lab for preordered studies . Due to this being pt's initial  thoracentesis and secondary to cough/chest discomfort only the above amount of fluid was removed today. EBL < 2 cc.

## 2023-03-28 NOTE — Progress Notes (Signed)
RT note: Pt. remains on [place] HFNC now currently at 8 lpm with Oxygen saturations of 92% and RR ~26-28 in no apparent distress, RT to monitor.

## 2023-03-29 ENCOUNTER — Encounter (HOSPITAL_COMMUNITY): Payer: Self-pay | Admitting: *Deleted

## 2023-03-29 ENCOUNTER — Inpatient Hospital Stay (HOSPITAL_COMMUNITY): Payer: BC Managed Care – PPO

## 2023-03-29 ENCOUNTER — Encounter: Payer: Self-pay | Admitting: Hematology and Oncology

## 2023-03-29 ENCOUNTER — Other Ambulatory Visit (HOSPITAL_COMMUNITY): Payer: Self-pay

## 2023-03-29 ENCOUNTER — Encounter (HOSPITAL_COMMUNITY): Payer: Self-pay

## 2023-03-29 DIAGNOSIS — C7800 Secondary malignant neoplasm of unspecified lung: Secondary | ICD-10-CM

## 2023-03-29 DIAGNOSIS — N3090 Cystitis, unspecified without hematuria: Secondary | ICD-10-CM

## 2023-03-29 DIAGNOSIS — E43 Unspecified severe protein-calorie malnutrition: Secondary | ICD-10-CM

## 2023-03-29 DIAGNOSIS — R0602 Shortness of breath: Secondary | ICD-10-CM

## 2023-03-29 DIAGNOSIS — I2694 Multiple subsegmental pulmonary emboli without acute cor pulmonale: Secondary | ICD-10-CM | POA: Diagnosis not present

## 2023-03-29 DIAGNOSIS — E440 Moderate protein-calorie malnutrition: Secondary | ICD-10-CM

## 2023-03-29 DIAGNOSIS — C55 Malignant neoplasm of uterus, part unspecified: Secondary | ICD-10-CM

## 2023-03-29 DIAGNOSIS — J910 Malignant pleural effusion: Secondary | ICD-10-CM

## 2023-03-29 DIAGNOSIS — D649 Anemia, unspecified: Secondary | ICD-10-CM

## 2023-03-29 DIAGNOSIS — I2699 Other pulmonary embolism without acute cor pulmonale: Secondary | ICD-10-CM | POA: Diagnosis not present

## 2023-03-29 LAB — COMPREHENSIVE METABOLIC PANEL
ALT: 9 U/L (ref 0–44)
AST: 13 U/L — ABNORMAL LOW (ref 15–41)
Albumin: 2.9 g/dL — ABNORMAL LOW (ref 3.5–5.0)
Alkaline Phosphatase: 55 U/L (ref 38–126)
Anion gap: 11 (ref 5–15)
BUN: 6 mg/dL (ref 6–20)
CO2: 25 mmol/L (ref 22–32)
Calcium: 8.1 mg/dL — ABNORMAL LOW (ref 8.9–10.3)
Chloride: 101 mmol/L (ref 98–111)
Creatinine, Ser: 0.66 mg/dL (ref 0.44–1.00)
GFR, Estimated: >60 mL/min (ref >60)
Glucose, Bld: 115 mg/dL — ABNORMAL HIGH (ref 70–99)
Potassium: 3.2 mmol/L — ABNORMAL LOW (ref 3.5–5.1)
Sodium: 137 mmol/L (ref 135–145)
Total Bilirubin: 1.1 mg/dL (ref 0.3–1.2)
Total Protein: 5.5 g/dL — ABNORMAL LOW (ref 6.5–8.1)

## 2023-03-29 LAB — ECHOCARDIOGRAM COMPLETE
AR max vel: 1.91 cm2
AV Area VTI: 1.86 cm2
AV Area mean vel: 1.85 cm2
AV Mean grad: 4.5 mmHg
AV Peak grad: 8.8 mmHg
Ao pk vel: 1.49 m/s
Area-P 1/2: 2.56 cm2
Calc EF: 70.1 %
Height: 68 in
MV VTI: 2.14 cm2
S' Lateral: 2.7 cm
Single Plane A2C EF: 69.2 %
Single Plane A4C EF: 70.3 %
Weight: 2007.07 oz

## 2023-03-29 LAB — CBC
HCT: 34.5 % — ABNORMAL LOW (ref 36.0–46.0)
Hemoglobin: 11 g/dL — ABNORMAL LOW (ref 12.0–15.0)
MCH: 30.5 pg (ref 26.0–34.0)
MCHC: 31.9 g/dL (ref 30.0–36.0)
MCV: 95.6 fL (ref 80.0–100.0)
Platelets: 238 10*3/uL (ref 150–400)
RBC: 3.61 MIL/uL — ABNORMAL LOW (ref 3.87–5.11)
RDW: 12.9 % (ref 11.5–15.5)
WBC: 10.5 10*3/uL (ref 4.0–10.5)
nRBC: 0 % (ref 0.0–0.2)

## 2023-03-29 LAB — HIV ANTIBODY (ROUTINE TESTING W REFLEX): HIV Screen 4th Generation wRfx: NONREACTIVE

## 2023-03-29 LAB — HEPARIN LEVEL (UNFRACTIONATED)
Heparin Unfractionated: 0.21 IU/mL — ABNORMAL LOW (ref 0.30–0.70)
Heparin Unfractionated: 0.2 IU/mL — ABNORMAL LOW (ref 0.30–0.70)

## 2023-03-29 MED ORDER — KATE FARMS STANDARD 1.4 PO LIQD: 325.0000 mL | Freq: Two times a day (BID) | ORAL | Status: DC

## 2023-03-29 MED ORDER — HEPARIN (PORCINE) 25000 UT/250ML-% IV SOLN
1250.0000 [IU]/h | INTRAVENOUS | Status: AC
  Administered 2023-03-30 – 2023-03-31 (×2): 1250 [IU]/h via INTRAVENOUS
  Filled 2023-03-29 (×2): qty 250

## 2023-03-29 MED ORDER — KATE FARMS STANDARD 1.4 PO LIQD
325.0000 mL | Freq: Two times a day (BID) | ORAL | Status: DC
  Administered 2023-03-30 – 2023-04-01 (×5): 325 mL via ORAL
  Filled 2023-03-29 (×6): qty 325

## 2023-03-29 MED ORDER — HEPARIN BOLUS VIA INFUSION
1000.0000 [IU] | Freq: Once | INTRAVENOUS | Status: AC
  Administered 2023-03-29: 1000 [IU] via INTRAVENOUS
  Filled 2023-03-29: qty 1000

## 2023-03-29 NOTE — TOC Benefit Eligibility Note (Signed)
Pharmacy Patient Advocate Encounter  Insurance verification completed.    The patient is insured through [employer]/ [employer]    Ran test claim for Marcelline Deist and the current 30 day co-pay is $0.00.  Ran test claim for Eliquis and the current 30 day co-pay is $0.00.  This test claim was processed through [facility]- copay amounts may vary at other pharmacies due to pharmacy/plan contracts, or as the patient moves through the different stages of their insurance plan.

## 2023-03-29 NOTE — Progress Notes (Signed)
ANTICOAGULATION CONSULT NOTE  Pharmacy Consult for heparin Indication: pulmonary embolus  Allergies  Allergen Reactions   Doxycycline Nausea Only and Other (See Comments)    Dizziness    Patient Measurements: Height: 5\' 8"  (172.7 cm) Weight: 56.9 kg (125 lb 7.1 oz) IBW/kg (Calculated) : 63.9 Heparin Dosing Weight: 56.9 kg  Vital Signs: Temp: 98.3 F (36.8 C) (06/18 1201) Temp Source: Oral (06/18 1201) BP: 143/76 (06/18 1500) Pulse Rate: 115 (06/18 1500)  Labs: Recent Labs    03/28/23 1245 03/28/23 1315 03/28/23 2055 03/29/23 0454 03/29/23 1408  HGB  --  9.5*  --  11.0*  --   HCT  --  30.7*  --  34.5*  --   PLT  --  218  --  238  --   HEPARINUNFRC  --   --  0.60 0.21* 0.20*  CREATININE 0.67  --   --  0.66  --      Estimated Creatinine Clearance: 70.5 mL/min (by C-G formula based on SCr of 0.66 mg/dL).   Medical History: Past Medical History:  Diagnosis Date   History of radiation therapy    Lumbar Spine- 03/10/22-03/23/22- Dr. Antony Blackbird   History of radiation therapy    SBRT to iliac nodes; 09/14/22-09/24/22 Dr. Antony Blackbird   Hypoglycemia    occasional episodes of hypoglycemia   IBS (irritable bowel syndrome)    Pathologic fracture of lumbar vertebra 02/04/2022     Assessment: 57 year old female presented to [facility] with shortness of breath. 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 Since then she reports SOB and reports that she was told that was a side effect of the new treatment. Imaging at outside hospital positive for pulmonary artery embolus in the left upper and lower lobes with no evidence of right heart strain. She was started on heparin - per report from transport, she is on heparin infusion at 10.6 ml/hr. Pharmacy consulted to manage heparin infusion.  Per med history, no anticoagulation noted PTA.  03/29/2023: 14:08 Heparin level 0.20 - sub-therapeutic  on IV heparin 1150 units/hr.  RN drew from port and heparin infusing through peripheral line.  CBC: Hg 11- improved, pltc 238 No bleeding or infusion related concerns reported by RN  Goal of Therapy:  Heparin level 0.3-0.7 units/ml Monitor platelets by anticoagulation protocol: Yes   Plan:  -Administer IV heparin 1000 unit bolus via infusion and increase heparin infusion to  1250 units/hr  -Repeat heparin level in 6h -Monitor daily heparin level, CBC, signs/symptoms of bleeding    Thank you for allowing pharmacy to be a part of this patient's care.  Selinda Eon, PharmD, BCPS Clinical Pharmacist [place] Please utilize Amion for appropriate phone number to reach the unit pharmacist [facility]) 03/29/2023 3:42 PM

## 2023-03-29 NOTE — Progress Notes (Signed)
Pt not on BIPAP at this time.  

## 2023-03-29 NOTE — Progress Notes (Signed)
Initial Nutrition Assessment  DOCUMENTATION CODES:   Non-severe (moderate) malnutrition in context of chronic illness  INTERVENTION:  - Regular diet.  [employer] 1.4 PO BID starting tomorrow, each supplement provides 455 kcal and 20 grams protein. - Encourage intake at all meals and of supplements.  - Monitor weight trends.    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  GOAL:   Patient will meet greater than or equal to 90% of their needs  MONITOR:   PO intake, Supplement acceptance, Weight trends  REASON FOR ASSESSMENT:   Malnutrition Screening Tool    ASSESSMENT:   57 y.o. 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 Admitted for pulmonary embolism.   Patient endorses a UBW of 130# and weight loss over the past ~3 weeks due to not eating well. Per EMR, weight without significant changes over the past year. Weight this admit very close to weight taken 1 month ago.   She reports that since starting her second round of chemo 3 weeks ago her appetite has been very poor and she has had a lot of nausea and vomiting. Only having small snacks when able and drinking Sport and exercise psychologist twice daily.  She notes that prior to the past 3 weeks she was eating well. Usually had 3 meals a day with normal appetite.  Current appetite is a little better than it has been and she reports eating some of her eggs and cereal for breakfast this morning.   Was ordered Ensure through [facility] protocol but patient notes she is afraid to drink it. States that she has also been having diarrhea and is worried the nutrition supplements (or an ingredient in them) might be making it worse. Wants to hold off on any  today (Ensure or [employer]). Discussed trying Molli Posey tomorrow for plant based option and patient hesitantly agreeable.  Stressed importance of ordering and trying to eat something at all 3 meals daily. Patient endorsed understanding.    Medications reviewed and include: -  Labs reviewed:  K+ 3.2   NUTRITION - FOCUSED PHYSICAL EXAM:  Flowsheet Row Most Recent Value  Orbital Region Moderate depletion  Upper Arm Region Mild depletion  Thoracic and Lumbar Region Mild depletion  Buccal Region Mild depletion  Temple Region Severe depletion  Clavicle Bone Region Severe depletion  Clavicle and Acromion Bone Region Moderate depletion  Scapular Bone Region Unable to assess  Dorsal Hand Mild depletion  Patellar Region Moderate depletion  Anterior Thigh Region Moderate depletion  Posterior Calf Region Mild depletion  Edema (RD Assessment) None  Hair Reviewed  Eyes Reviewed  Mouth Reviewed  Skin Reviewed  Nails Reviewed       Diet Order:   Diet Order             Diet regular Room service appropriate? Yes; Fluid consistency: Thin  Diet effective now                   EDUCATION NEEDS:  Education needs have been addressed  Skin:  Skin Assessment: Reviewed RN Assessment  Last BM:  PTA  Height:  Ht Readings from Last 1 Encounters:  03/28/23 5\' 8"  (1.727 m)   Weight:  Wt Readings from Last 1 Encounters:  03/28/23 56.9  kg    BMI:  Body mass index is 19.07 kg/m.  Estimated Nutritional Needs:  Kcal:  1800-2000 kcals Protein:  70-80 grams Fluid:  >/= 1.9L    Shelle Iron RD, LDN For contact information, refer to [facility].

## 2023-03-29 NOTE — Progress Notes (Signed)
ANTICOAGULATION CONSULT NOTE  Pharmacy Consult for heparin Indication: pulmonary embolus  Allergies  Allergen Reactions   Doxycycline Nausea Only and Other (See Comments)    Dizziness    Patient Measurements: Height: 5\' 8"  (172.7 cm) Weight: 56.9 kg (125 lb 7.1 oz) IBW/kg (Calculated) : 63.9 Heparin Dosing Weight: 56.9 kg  Vital Signs: Temp: 98.1 F (36.7 C) (06/18 0000) Temp Source: Oral (06/18 0000) BP: 126/68 (06/18 0600) Pulse Rate: 89 (06/18 0600)  Labs: Recent Labs    03/28/23 1245 03/28/23 1315 03/28/23 2055 03/29/23 0454  HGB  --  9.5*  --  11.0*  HCT  --  30.7*  --  34.5*  PLT  --  218  --  238  HEPARINUNFRC  --   --  0.60 0.21*  CREATININE 0.67  --   --  0.66     Estimated Creatinine Clearance: 70.5 mL/min (by C-G formula based on SCr of 0.66 mg/dL).   Medical History: Past Medical History:  Diagnosis Date   History of radiation therapy    Lumbar Spine- 03/10/22-03/23/22- Dr. Antony Blackbird   History of radiation therapy    SBRT to iliac nodes; 09/14/22-09/24/22 Dr. Antony Blackbird   Hypoglycemia    occasional episodes of hypoglycemia   IBS (irritable bowel syndrome)    Pathologic fracture of lumbar vertebra 02/04/2022     Assessment: 57 year old female presented to [facility] with shortness of breath. 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 Since then she reports SOB and reports that she was told that was a side effect of the new treatment. Imaging at outside hospital positive for pulmonary artery embolus in the left upper and lower lobes with no evidence of right heart strain. She was started on heparin - per report from transport, she is on heparin infusion at 10.6 ml/hr. Pharmacy consulted to manage heparin infusion.  Per med history, no anticoagulation noted PTA. Her hemoglobin here is down from previous collection at [facility].  03/29/2023: Confirmatory  heparin level 0.21- now sub-therapeutic on IV heparin 1050 units/hr.  RN drew from port and heparin infusing through peripheral line.  CBC: Hg 11- improved, pltc 238 No bleeding or infusion related concerns reported by RN  Goal of Therapy:  Heparin level 0.3-0.7 units/ml Monitor platelets by anticoagulation protocol: Yes   Plan:  -Increase heparin infusion to 1150 units/hr  -Repeat heparin level in 6h -Daily heparin level & CBC while on heparin infusion   Junita Push, PharmD, BCPS Clinical Pharmacist 03/29/2023 6:08 AM

## 2023-03-29 NOTE — Progress Notes (Addendum)
PROGRESS NOTE    Tammie Gilmore  BJY:[id number] DOB: 10/03/1966 DOA: 03/28/2023 PCP: Juliette Alcide, MD   Brief Narrative:  Tammie Gilmore is a 57 y.o. 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 Bertis Ruddy, pathological fracture, hemorrhoids, acquired pancytopenia, peripheral neuropathy from chemotherapy, osteopenia, anxiety who was transferred from [facility] after presenting there with progressively worse dyspnea for the past 3 weeks.   Transferred from outside facility with large right sided pleural effusion; small left sided PE for evaluation with interventional radiology as they do not currently have access to the specialty at the campus.  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)   Cystitis   Normocytic anemia   Protein-calorie malnutrition, severe (HCC)   Malignant pleural effusion   Acute hypoxic respiratory failure, multifactorial Concurrent acute symptomatic pulmonary embolism (HCC) Concurrent pleural effusion, questionably malignant -Ultrasound-guided right thoracentesis with 1.6 L of fluid drained with minimal improvement in patient's symptoms -Overnight worsening of symptoms concerning for pneumothorax but repeat imaging negative -transiently placed on BiPAP with IV Lasix, symptoms appear to be improving now -Discussed with IR signs of PE given questionable need for thrombolytics but notably small per their read. 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) -Ultrasound-guided thoracentesis with 1.6 L fluid removed overnight -appreciate IR assistance -Oncology to follow along,  appreciate insight and recommendations   Questionable cystitis Continue ceftriaxone x3 days -follow sensitivities   Normocytic anemia, likely of chronic disease Stable, likely secondary to above malignancy No notable blood loss, bleeding, easy bruising   Protein-calorie malnutrition, severe (HCC) Appreciate nutrition recommendations to ensure appropriate caloric intake *Of note patient states intolerance to OTC protein drinks(diarrhea)  DVT prophylaxis: Heparin drip Code Status: Full Family Communication: Husband at bedside  Status is: Inpatient  Dispo: The patient is from: Home              Anticipated d/c is to: Home              Anticipated d/c date is: 48 to 72 hours              Patient currently not medically stable for discharge given ongoing respiratory distress need for ongoing IV anticoagulation imaging and further workup with oncology  Consultants:  Oncology, interventional radiology  Procedures:  Right thoracentesis 03/28/2023  Antimicrobials:  Ceftriaxone x 3 days  Subjective: Acute worsening of patient's respiratory distress and hypoxia overnight requiring transiently being placed on BiPAP for respiratory support while given furosemide in the setting of presumed volume overload.  Symptoms improving now back on nasal cannula otherwise denies nausea vomiting diarrhea constipation headache fevers chills.  Objective: Vitals:   03/29/23 0300 03/29/23 0400 03/29/23 0500 03/29/23 0600  BP: (!) 142/68 (!) 170/99 133/75 126/68  Pulse: 96 (!) 120 (!) 103 89  Resp: 20 (!) 29 (!) 23 (!) 21  Temp:  97.9 F (36.6 C)    TempSrc:  Oral    SpO2: 100% 99% 98% 99%  Weight:      Height:        Intake/Output Summary (Last 24 hours) at 03/29/2023 0654 Last data filed at 03/29/2023 0614 Gross per 24 hour  Intake 861.78 ml  Output  375 ml  Net 486.78 ml   Filed Weights   03/28/23 1145  Weight: 56.9 kg    Examination:  General:  Pleasantly resting in bed, No acute  distress. HEENT:  Normocephalic atraumatic.  Sclerae nonicteric, noninjected.  Extraocular movements intact bilaterally. Neck:  Without mass or deformity.  Trachea is midline. Lungs: Without overt rhonchi, wheeze, or rales. Heart: Tachycardic but regular without notable murmurs rubs or gallops Abdomen: Without rebound or guarding Extremities: Without cyanosis or clubbing. Skin:  Warm and dry, no erythema  Data Reviewed: I have personally reviewed following labs and imaging studies  CBC: Recent Labs  Lab 03/28/23 1315 03/29/23 0454  WBC 8.4 10.5  HGB 9.5* 11.0*  HCT 30.7* 34.5*  MCV 97.5 95.6  PLT 218 238   Basic Metabolic Panel: Recent Labs  Lab 03/28/23 1245 03/29/23 0454  NA 137 137  K 3.5 3.2*  CL 103 101  CO2 22 25  GLUCOSE 98 115*  BUN 7 6  CREATININE 0.67 0.66  CALCIUM 8.0* 8.1*  MG 1.7  --   PHOS 3.7  --    GFR: Estimated Creatinine Clearance: 70.5 mL/min (by C-G formula based on SCr of 0.66 mg/dL). Liver Function Tests: Recent Labs  Lab 03/28/23 1245 03/29/23 0454  AST 15 13*  ALT 11 9  ALKPHOS 62 55  BILITOT 0.8 1.1  PROT 5.4* 5.5*  ALBUMIN 2.5* 2.9*   No results found for this or any previous visit (from the past 240 hour(s)).   Radiology Studies: DG CHEST PORT 1 VIEW  Result Date: 03/28/2023 CLINICAL DATA:  Increased shortness of breath EXAM: PORTABLE CHEST 1 VIEW COMPARISON:  03/28/2023, CT 02/11/2023 FINDINGS: Right-sided central venous port tip at the cavoatrial region. Bilateral pulmonary nodules corresponding to history of metastatic disease. At least moderate sized right pleural effusion which may be redistributed. Diffuse ground-glass opacity in the right thorax potentially due to edema. No convincing pneumothorax. Persistent consolidation at the right base. IMPRESSION: 1. Bilateral pulmonary nodules corresponding to history of metastatic disease. 2. At least moderate sized right pleural effusion which may be redistributed or slightly  increased since exam earlier today. Diffuse ground-glass opacity in the right thorax appears worsened and may be due to asymmetric edema. Electronically Signed   By: Jasmine Pang M.D.   On: 03/28/2023 21:37   VAS [place] LOWER EXTREMITY VENOUS (DVT)  Result Date: 03/28/2023  Lower Venous DVT Study Patient Name:  SHONELLE TILLMON  Date of Exam:   03/28/2023 Medical Rec #: [id number]          Accession #:    [id number] Date of Birth: [birth year]         Patient Gender: F Patient Age:   77 years Exam Location:  [facility] Procedure:      VAS [place] LOWER EXTREMITY VENOUS (DVT) Referring Phys: DAVID ORTIZ --------------------------------------------------------------------------------  Indications: Pulmonary embolism.  Risk Factors: Confirmed PE Cancer. Anticoagulation: Heparin. Comparison Study: No prior studies. Performing Technologist: Chanda Busing RVT  Examination Guidelines: A complete evaluation includes B-mode imaging, spectral Doppler, color Doppler, and power Doppler as needed of all accessible portions of each vessel. Bilateral testing is considered an integral part of a complete examination. Limited examinations for reoccurring indications may be performed as noted. The reflux portion of the exam is performed with the patient in reverse Trendelenburg.  +---------+---------------+---------+-----------+----------+--------------+ RIGHT    CompressibilityPhasicitySpontaneityPropertiesThrombus Aging +---------+---------------+---------+-----------+----------+--------------+ CFV      Full           Yes  Yes                                 +---------+---------------+---------+-----------+----------+--------------+ SFJ      Full                                                        +---------+---------------+---------+-----------+----------+--------------+ FV Prox  Full                                                         +---------+---------------+---------+-----------+----------+--------------+ FV Mid   Full                                                        +---------+---------------+---------+-----------+----------+--------------+ FV DistalFull                                                        +---------+---------------+---------+-----------+----------+--------------+ PFV      Full                                                        +---------+---------------+---------+-----------+----------+--------------+ POP      Full           Yes      Yes                                 +---------+---------------+---------+-----------+----------+--------------+ PTV      Full                                                        +---------+---------------+---------+-----------+----------+--------------+ PERO     Full                                                        +---------+---------------+---------+-----------+----------+--------------+   +---------+---------------+---------+-----------+----------+--------------+ LEFT     CompressibilityPhasicitySpontaneityPropertiesThrombus Aging +---------+---------------+---------+-----------+----------+--------------+ CFV      Full           Yes      Yes                                 +---------+---------------+---------+-----------+----------+--------------+ SFJ      Full                                                        +---------+---------------+---------+-----------+----------+--------------+  FV Prox  Full                                                        +---------+---------------+---------+-----------+----------+--------------+ FV Mid   Full                                                        +---------+---------------+---------+-----------+----------+--------------+ FV DistalFull                                                         +---------+---------------+---------+-----------+----------+--------------+ PFV      Full                                                        +---------+---------------+---------+-----------+----------+--------------+ POP      Full           Yes      Yes                                 +---------+---------------+---------+-----------+----------+--------------+ PTV      Full                                                        +---------+---------------+---------+-----------+----------+--------------+ PERO     Full                                                        +---------+---------------+---------+-----------+----------+--------------+     Summary: RIGHT: - There is no evidence of deep vein thrombosis in the lower extremity.  - No cystic structure found in the popliteal fossa.  LEFT: - There is no evidence of deep vein thrombosis in the lower extremity.  - No cystic structure found in the popliteal fossa.  *See table(s) above for measurements and observations. Electronically signed by Sherald Hess MD on 03/28/2023 at 5:36:09 PM.    Final    US THORACENTESIS ASP PLEURAL SPACE W/IMG GUIDE  Result Date: 03/28/2023 INDICATION: Patient with history of dyspnea, pulmonary embolus, bilateral pleural effusions right greater than left. Request received for diagnostic and therapeutic right thoracentesis. EXAM: ULTRASOUND GUIDED DIAGNOSTIC AND THERAPEUTIC RIGHT THORACENTESIS MEDICATIONS: 8 mL of 1% lidocaine COMPLICATIONS: None immediate. PROCEDURE: An ultrasound guided thoracentesis was thoroughly discussed with the patient and questions answered. The benefits, risks, alternatives and complications were also discussed. The patient understands and wishes to proceed with the procedure. Written consent was obtained. Ultrasound was performed to localize and mark  an adequate pocket of fluid in the right chest. The area was then prepped and draped in the normal sterile fashion. 1%  Lidocaine was used for local anesthesia. Under ultrasound guidance a 6 Fr Safe-T-Centesis catheter was introduced. Thoracentesis was performed. The catheter was removed and a dressing applied. FINDINGS: A total of approximately 1.6 liters of clear, yellow fluid was removed. Samples were sent to the laboratory as requested by the clinical team. IMPRESSION: Successful ultrasound guided diagnostic and therapeutic right thoracentesis yielding 1.6 liters of pleural fluid. Performed by: Artemio Aly Electronically Signed   By: Simonne Come M.D.   On: 03/28/2023 17:05   DG Chest Port 1 View  Result Date: 03/28/2023 CLINICAL DATA:  Post right-sided thoracentesis. EXAM: PORTABLE CHEST 1 VIEW COMPARISON:  03/27/2023; chest CT-02/11/2023 FINDINGS: Interval reduction in persistent moderate sized partially loculated right-sided pleural effusion post thoracentesis. Improved aeration of the right lung with residual right mid and lower lung atelectasis/collapse. Improved visualization of known a proximally 1.8 cm right upper lung pulmonary nodule. No pneumothorax. Similar appearance of the left lung including proximally 1.8 cm left lower lung pulmonary nodule. No evidence of edema. Grossly unchanged cardiac silhouette and mediastinal contours. Stable positioning of support apparatus. No acute osseous abnormalities. IMPRESSION: 1. Interval reduction in persistent moderate sized partially loculated right-sided pleural effusion post thoracentesis. No pneumothorax. 2. Improved aeration of the right lung with residual right mid and lower lung atelectasis/collapse. 3. Redemonstrated known bilateral pulmonary metastases. Electronically Signed   By: Simonne Come M.D.   On: 03/28/2023 16:59   DG Outside Films Chest  Result Date: 03/28/2023 This examination belongs to an outside facility and is stored here for comparison purposes only.  Contact the originating outside institution for any associated report or interpretation.  CT  OUTSIDE FILMS CHEST  Result Date: 03/28/2023 This examination belongs to an outside facility and is stored here for comparison purposes only.  Contact the originating outside institution for any associated report or interpretation.   Scheduled Meds:  Chlorhexidine Gluconate Cloth  6 each Topical Daily   feeding supplement  237 mL Oral BID BM   metoprolol succinate  25 mg Oral QHS   pantoprazole  40 mg Oral Daily   Continuous Infusions:  sodium chloride 10 mL/hr at 03/29/23 0614   cefTRIAXone (ROCEPHIN)  IV Stopped (03/28/23 1827)   heparin 1,150 Units/hr (03/29/23 7829)     LOS: 1 day   Time spent:  Azucena Fallen, DO [facility]  If 7PM-7AM, please contact night-coverage [email or web address]  03/29/2023, 6:54 AM

## 2023-03-29 NOTE — Progress Notes (Signed)
RT note: Pt. has PRN BiPAP order, currently on [place] HFNC@ 8lpm tolerating well and achieving desired saturations.

## 2023-03-30 ENCOUNTER — Inpatient Hospital Stay (HOSPITAL_COMMUNITY): Payer: BC Managed Care – PPO

## 2023-03-30 DIAGNOSIS — C55 Malignant neoplasm of uterus, part unspecified: Secondary | ICD-10-CM | POA: Diagnosis not present

## 2023-03-30 DIAGNOSIS — I2694 Multiple subsegmental pulmonary emboli without acute cor pulmonale: Secondary | ICD-10-CM | POA: Diagnosis not present

## 2023-03-30 DIAGNOSIS — C7800 Secondary malignant neoplasm of unspecified lung: Secondary | ICD-10-CM | POA: Diagnosis not present

## 2023-03-30 DIAGNOSIS — J910 Malignant pleural effusion: Secondary | ICD-10-CM | POA: Diagnosis not present

## 2023-03-30 DIAGNOSIS — I2609 Other pulmonary embolism with acute cor pulmonale: Secondary | ICD-10-CM

## 2023-03-30 DIAGNOSIS — E43 Unspecified severe protein-calorie malnutrition: Secondary | ICD-10-CM | POA: Diagnosis not present

## 2023-03-30 LAB — BASIC METABOLIC PANEL
Anion gap: 10 (ref 5–15)
BUN: 7 mg/dL (ref 6–20)
CO2: 26 mmol/L (ref 22–32)
Calcium: 8.1 mg/dL — ABNORMAL LOW (ref 8.9–10.3)
Chloride: 103 mmol/L (ref 98–111)
Creatinine, Ser: 0.55 mg/dL (ref 0.44–1.00)
GFR, Estimated: >60 mL/min (ref >60)
Glucose, Bld: 106 mg/dL — ABNORMAL HIGH (ref 70–99)
Potassium: 3.1 mmol/L — ABNORMAL LOW (ref 3.5–5.1)
Sodium: 139 mmol/L (ref 135–145)

## 2023-03-30 LAB — HEPARIN LEVEL (UNFRACTIONATED)
Heparin Unfractionated: 0.42 IU/mL (ref 0.30–0.70)
Heparin Unfractionated: 0.46 IU/mL (ref 0.30–0.70)

## 2023-03-30 LAB — CBC
HCT: 33.5 % — ABNORMAL LOW (ref 36.0–46.0)
Hemoglobin: 10.6 g/dL — ABNORMAL LOW (ref 12.0–15.0)
MCH: 30.3 pg (ref 26.0–34.0)
MCHC: 31.6 g/dL (ref 30.0–36.0)
MCV: 95.7 fL (ref 80.0–100.0)
Platelets: 258 10*3/uL (ref 150–400)
RBC: 3.5 MIL/uL — ABNORMAL LOW (ref 3.87–5.11)
RDW: 12.8 % (ref 11.5–15.5)
WBC: 8.1 10*3/uL (ref 4.0–10.5)
nRBC: 0 % (ref 0.0–0.2)

## 2023-03-30 LAB — MRSA NEXT GEN BY PCR, NASAL: MRSA by PCR Next Gen: NOT DETECTED

## 2023-03-30 LAB — PATHOLOGIST SMEAR REVIEW

## 2023-03-30 LAB — BRAIN NATRIURETIC PEPTIDE: B Natriuretic Peptide: 56.6 pg/mL (ref 0.0–100.0)

## 2023-03-30 LAB — PROCALCITONIN: Procalcitonin: 0.32 ng/mL

## 2023-03-30 LAB — PHOSPHORUS: Phosphorus: 3.5 mg/dL (ref 2.5–4.6)

## 2023-03-30 MED ORDER — POLYVINYL ALCOHOL 1.4 % OP SOLN
1.0000 [drp] | OPHTHALMIC | Status: DC | PRN
  Filled 2023-03-30 (×2): qty 15

## 2023-03-30 MED ORDER — LIP MEDEX EX OINT: 1.0000 | TOPICAL_OINTMENT | CUTANEOUS | Status: DC | PRN

## 2023-03-30 MED ORDER — MUSCLE RUB 10-15 % EX CREA
1.0000 | TOPICAL_CREAM | Freq: Three times a day (TID) | CUTANEOUS | Status: DC | PRN
  Filled 2023-03-30: qty 85

## 2023-03-30 MED ORDER — LORATADINE 10 MG PO TABS: 10.0000 mg | ORAL_TABLET | Freq: Every day | ORAL | Status: DC | PRN

## 2023-03-30 MED ORDER — SALINE SPRAY 0.65 % NA SOLN
1.0000 | NASAL | Status: DC | PRN
  Filled 2023-03-30: qty 44

## 2023-03-30 MED ORDER — FUROSEMIDE 10 MG/ML IJ SOLN: 40.0000 mg | Freq: Once | INTRAMUSCULAR | Status: DC

## 2023-03-30 MED ORDER — ALUM & MAG HYDROXIDE-SIMETH 200-200-20 MG/5ML PO SUSP: 30.0000 mL | ORAL | Status: DC | PRN

## 2023-03-30 MED ORDER — POTASSIUM CHLORIDE CRYS ER 20 MEQ PO TBCR
40.0000 meq | EXTENDED_RELEASE_TABLET | Freq: Once | ORAL | Status: DC
  Filled 2023-03-30: qty 2

## 2023-03-30 MED ORDER — IPRATROPIUM-ALBUTEROL 0.5-2.5 (3) MG/3ML IN SOLN: 3.0000 mL | RESPIRATORY_TRACT | Status: DC | PRN

## 2023-03-30 MED ORDER — TRAZODONE HCL 50 MG PO TABS
50.0000 mg | ORAL_TABLET | Freq: Every evening | ORAL | Status: DC | PRN
  Administered 2023-04-04: 50 mg via ORAL
  Filled 2023-03-30 (×2): qty 1

## 2023-03-30 MED ORDER — HYDROCORTISONE 1 % EX CREA
1.0000 | TOPICAL_CREAM | Freq: Three times a day (TID) | CUTANEOUS | Status: DC | PRN
  Filled 2023-03-30: qty 28

## 2023-03-30 MED ORDER — POTASSIUM CHLORIDE 20 MEQ PO PACK
40.0000 meq | PACK | Freq: Once | ORAL | Status: AC
  Administered 2023-03-30: 40 meq via ORAL
  Filled 2023-03-30: qty 2

## 2023-03-30 MED ORDER — METOPROLOL TARTRATE 5 MG/5ML IV SOLN
5.0000 mg | INTRAVENOUS | Status: DC | PRN
  Administered 2023-03-30 – 2023-04-03 (×5): 5 mg via INTRAVENOUS
  Filled 2023-03-30 (×5): qty 5

## 2023-03-30 MED ORDER — HYDRALAZINE HCL 20 MG/ML IJ SOLN: 10.0000 mg | INTRAMUSCULAR | Status: DC | PRN

## 2023-03-30 MED ORDER — FUROSEMIDE 10 MG/ML IJ SOLN: 40.0000 mg | Freq: Every day | INTRAMUSCULAR | Status: DC

## 2023-03-30 MED ORDER — PHENOL 1.4 % MT LIQD: 1.0000 | OROMUCOSAL | Status: DC | PRN

## 2023-03-30 MED ORDER — HYDROCORTISONE (PERIANAL) 2.5 % EX CREA
1.0000 | TOPICAL_CREAM | Freq: Four times a day (QID) | CUTANEOUS | Status: DC | PRN
  Filled 2023-03-30: qty 28.35

## 2023-03-30 NOTE — Progress Notes (Signed)
Rt gave pt flutter valve per MD order. Pt knows and understands how to use. 

## 2023-03-30 NOTE — Progress Notes (Signed)
ANTICOAGULATION CONSULT NOTE  Pharmacy Consult for heparin Indication: pulmonary embolus  Allergies  Allergen Reactions   Doxycycline Nausea Only and Other (See Comments)    Dizziness    Patient Measurements: Height: 5\' 8"  (172.7 cm) Weight: 56.9 kg (125 lb 7.1 oz) IBW/kg (Calculated) : 63.9 Heparin Dosing Weight: 56.9 kg  Vital Signs: Temp: 98.3 F (36.8 C) (06/18 2000) Temp Source: Axillary (06/18 2000) BP: 138/59 (06/19 0100) Pulse Rate: 84 (06/19 0100)  Labs: Recent Labs    03/28/23 1245 03/28/23 1315 03/28/23 2055 03/29/23 0454 03/29/23 1408 03/30/23 0124  HGB  --  9.5*  --  11.0*  --   --   HCT  --  30.7*  --  34.5*  --   --   PLT  --  218  --  238  --   --   HEPARINUNFRC  --   --    < > 0.21* 0.20* 0.42  CREATININE 0.67  --   --  0.66  --   --    < > = values in this interval not displayed.     Estimated Creatinine Clearance: 70.5 mL/min (by C-G formula based on SCr of 0.66 mg/dL).   Medical History: Past Medical History:  Diagnosis Date   History of radiation therapy    Lumbar Spine- 03/10/22-03/23/22- Dr. Antony Blackbird   History of radiation therapy    SBRT to iliac nodes; 09/14/22-09/24/22 Dr. Antony Blackbird   Hypoglycemia    occasional episodes of hypoglycemia   IBS (irritable bowel syndrome)    Pathologic fracture of lumbar vertebra 02/04/2022     Assessment: 57 year old female presented to [facility] with shortness of breath. 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 Since then she reports SOB and reports that she was told that was a side effect of the new treatment. Imaging at outside hospital positive for pulmonary artery embolus in the left upper and lower lobes with no evidence of right heart strain. She was started on heparin - per report from transport, she is on heparin infusion at 10.6 ml/hr. Pharmacy consulted to manage heparin infusion.  Per med  history, no anticoagulation noted PTA.  03/30/2023: Heparin level 0.42 - now therapeutic on IV heparin 1250 units/hr.  RN drew from port and heparin infusing through peripheral line.  CBC: Hg 11, pltc 238 (on 6/18) No bleeding or infusion related concerns reported by RN  Goal of Therapy:  Heparin level 0.3-0.7 units/ml Monitor platelets by anticoagulation protocol: Yes   Plan:  -Continue IV heparin infusion at 1250 units/hr  -Repeat heparin level at 0800 -Monitor daily heparin level, CBC, signs/symptoms of bleeding -Follow-up long-term anticoagulation plans  Thank you for allowing pharmacy to be a part of this patient's care.  Junita Push, PharmD, BCPS 03/30/2023 1:45 AM

## 2023-03-30 NOTE — Progress Notes (Signed)
ANTICOAGULATION CONSULT NOTE  Pharmacy Consult for heparin Indication: pulmonary embolus  Allergies  Allergen Reactions   Doxycycline Nausea Only and Other (See Comments)    Dizziness    Patient Measurements: Height: 5\' 8"  (172.7 cm) Weight: 56.9 kg (125 lb 7.1 oz) IBW/kg (Calculated) : 63.9 Heparin Dosing Weight: 56.9 kg  Vital Signs: Temp: 98.2 F (36.8 C) (06/19 0800) Temp Source: Oral (06/19 0800) BP: 130/80 (06/19 0600) Pulse Rate: 106 (06/19 0600)  Labs: Recent Labs     0000 03/28/23 1245 03/28/23 1315 03/28/23 2055 03/29/23 0454 03/29/23 1408 03/30/23 0124 03/30/23 0208 03/30/23 0744 03/30/23 0745  HGB   < >  --  9.5*  --  11.0*  --   --  10.6*  --   --   HCT  --   --  30.7*  --  34.5*  --   --  33.5*  --   --   PLT  --   --  218  --  238  --   --  258  --   --   HEPARINUNFRC  --   --   --    < > 0.21* 0.20* 0.42  --   --  0.46  CREATININE  --  0.67  --   --  0.66  --   --   --  0.55  --    < > = values in this interval not displayed.     Estimated Creatinine Clearance: 70.5 mL/min (by C-G formula based on SCr of 0.55 mg/dL).   Medical History: Past Medical History:  Diagnosis Date   History of radiation therapy    Lumbar Spine- 03/10/22-03/23/22- Dr. Antony Blackbird   History of radiation therapy    SBRT to iliac nodes; 09/14/22-09/24/22 Dr. Antony Blackbird   Hypoglycemia    occasional episodes of hypoglycemia   IBS (irritable bowel syndrome)    Pathologic fracture of lumbar vertebra 02/04/2022     Assessment: 57 year old female presented to [facility] with shortness of breath. 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 Since then she reports SOB and reports that she was told that was a side effect of the new treatment. Imaging at outside hospital positive for pulmonary artery embolus in the left upper and lower lobes with no evidence of right heart strain. She was  started on heparin - per report from transport, she is on heparin infusion at 10.6 ml/hr. Pharmacy consulted to manage heparin infusion.  Per med history, no anticoagulation noted PTA.  03/30/2023: Heparin level 0.46 -   therapeutic on IV heparin 1250 units/hr.    CBC: Hgb 10.6, pltc 258   No bleeding or infusion related concerns reported by RN  Goal of Therapy:  Heparin level 0.3-0.7 units/ml Monitor platelets by anticoagulation protocol: Yes   Plan:  -Continue IV heparin infusion at 1250 units/hr  -Monitor daily heparin level, CBC, signs/symptoms of bleeding -Follow-up long-term anticoagulation plans   Adalberto Cole, PharmD, BCPS 03/30/2023 9:53 AM

## 2023-03-30 NOTE — Progress Notes (Signed)
Tammie Gilmore   DOB:01-28-1966   ZO#:[id number]    ASSESSMENT & PLAN:  Metastatic leiomyosarcoma She is receiving treatment at [facility], due cycle 2 on 6/17, delayed due to admission Patient is interested to receive more treatment Continue supportive care  Right pleural effusion Likely malignant I recommend intermittent thoracentesis for now rather than Pleurx since patient has just started on new chemo 3 weeks ago and could respond favorably to treatment However, if she changes her mind, then Pleurx would be appropriate if she is transitioned to palliative care/hospice  Recent changes in cardiac function, questionable fluid overload Her ECHO at [facility] suggest reduce EF just 10 days ago but repeat ECHo yesterday showed normal EF She felt better after recent thoracentesis and diuresis Recommend continue medical management and weaning off oxygen as tolerated  Pulmonary embolism Due to her active cancer. Recommend transition to NOAC if pulmonary team agree not to place Pleurx  Protein calorie malnutrition Possibly contributing to 3rd spacing  Goals of care Overall she wants to "try" to receive more chemo "if she is strong enough" She will continue treatment as directed by her oncologist at [facility]  Discharge planning Unknown, likely next 2-3 days  All questions were answered. The patient knows to call the clinic with any problems, questions or concerns.   The total time spent in the appointment was 55 minutes encounter with patients including review of chart and various tests results, discussions about plan of care and coordination of care plan  Artis Delay, MD 03/30/2023 4:02 PM  Subjective:  I was informed of her admission. Patient transferred care to [facility] after progression on multiple lines of treatment. She was due for 2nd cycle of chemo on 6/17, delayed due to admission for SOB/pleural effusion and incidental finding of PE. All outside records were reviewed. Recent ECHO at [facility] suggested  mild cardiomyopathy but repeat ECHo yesterday showed normal EF. She is transferred to [facility] from [facility]. Had thoracentesis, diuresis and started on IV heparin. Felt better today.On high flow oxygen. Mild cough but much improved  Objective:  Vitals:   03/30/23 1400 03/30/23 1500  BP: (!) 165/89 (!) 147/83  Pulse: (!) 117 (!) 114  Resp: (!) 22 (!) 32  Temp:    SpO2: 99% 99%     Intake/Output Summary (Last 24 hours) at 03/30/2023 1602 Last data filed at 03/30/2023 1553 Gross per 24 hour  Intake 569.48 ml  Output --  Net 569.48 ml    GENERAL:alert, no distress and comfortable. On high flow oxygen NEURO: alert & oriented x 3 with fluent speech, no focal motor/sensory deficits   Labs:  Recent Labs    02/01/23 0923 03/28/23 1245 03/29/23 0454 03/30/23 0744  NA 141 137 137 139  K 3.9 3.5 3.2* 3.1*  CL 106 103 101 103  CO2 26 22 25 26   GLUCOSE 101* 98 115* 106*  BUN 13 7 6 7   CREATININE 0.68 0.67 0.66 0.55  CALCIUM 9.4 8.0* 8.1* 8.1*  GFRNONAA >60 >60 >60 >60  PROT 6.8 5.4* 5.5*  --   ALBUMIN 4.1 2.5* 2.9*  --   AST 13* 15 13*  --   ALT 8 11 9   --   ALKPHOS 84 62 55  --   BILITOT 0.4 0.8 1.1  --     Studies:  ECHOCARDIOGRAM COMPLETE  Result Date: 03/29/2023    ECHOCARDIOGRAM REPORT   Patient Name:   Tammie Gilmore Date of Exam: 03/29/2023 Medical Rec #:  [id number]  Height:       68.0 in Accession #:    [id number]        Weight:       125.4 lb Date of Birth:  May 31, 1966        BSA:          1.676 m Patient Age:    56 years          BP:           162/90 mmHg Patient Gender: F                 HR:           106 bpm. Exam Location:  Inpatient Procedure: 2D Echo, Cardiac Doppler, Color Doppler and Strain Analysis Indications:    Pulmonary embolus  History:        Patient has prior history of Echocardiogram examinations, most                 recent 11/05/2022. Cancer, Signs/Symptoms:Dyspnea; Risk                 Factors:pulmonary embolism.  Sonographer:    Wallie Char  Referring Phys: [id number] DAVID MANUEL ORTIZ  Sonographer Comments: Image acquisition challenging due to respiratory motion. Global longitudinal strain was attempted. IMPRESSIONS  1. Left ventricular ejection fraction, by estimation, is 65 to 70%. The left ventricle has normal function. The left ventricle has no regional wall motion abnormalities. Left ventricular diastolic parameters were normal.  2. Right ventricular systolic function is normal. The right ventricular size is normal. There is normal pulmonary artery systolic pressure. The estimated right ventricular systolic pressure is 27.4 mmHg.  3. The mitral valve is myxomatous. No evidence of mitral valve regurgitation. No evidence of mitral stenosis. 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  4. The aortic valve is tricuspid. Aortic valve regurgitation is mild. No aortic stenosis is present.  5. The inferior vena cava is normal in size with greater than 50% respiratory variability, suggesting right atrial pressure of 3 mmHg. Comparison(s): Prior images reviewed side by side. The left ventricular function has improved. The right ventricular systolic function has improved. FINDINGS  Left Ventricle: Left ventricular ejection fraction, by estimation, is 65 to 70%. The left ventricle has normal function. The left ventricle has no regional wall motion abnormalities. Global longitudinal strain performed but not reported based on interpreter judgement due to suboptimal tracking. The left ventricular internal cavity size was normal in size. There is no left ventricular hypertrophy. Left ventricular diastolic parameters were normal. Right Ventricle: The right ventricular size is normal. No increase in right ventricular wall thickness. Right ventricular systolic function is normal. There is normal pulmonary artery systolic pressure. The tricuspid regurgitant velocity is 2.47 m/s, and  with an assumed right  atrial pressure of 3 mmHg, the estimated right ventricular systolic pressure is 27.4 mmHg. Left Atrium: Left atrial size was normal in size. Right Atrium: Right atrial size was normal in size. Pericardium: There is no evidence of pericardial effusion. 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 No evidence of mitral valve regurgitation. No evidence of mitral valve stenosis. MV peak gradient, 4.7 mmHg. The mean mitral valve gradient is 2.0 mmHg. Tricuspid Valve: The tricuspid valve is normal in structure. Tricuspid valve regurgitation is mild . No evidence of tricuspid stenosis. Aortic Valve: The  aortic valve is tricuspid. Aortic valve regurgitation is mild. No aortic stenosis is present. Aortic valve mean gradient measures 4.5 mmHg. Aortic valve peak gradient measures 8.8 mmHg. Aortic valve area, by VTI measures 1.86 cm. Pulmonic Valve: The pulmonic valve was normal in structure. Pulmonic valve regurgitation is not visualized. No evidence of pulmonic stenosis. Aorta: The aortic root is normal in size and structure. Venous: The inferior vena cava is normal in size with greater than 50% respiratory variability, suggesting right atrial pressure of 3 mmHg. IAS/Shunts: No atrial level shunt detected by color flow Doppler.  LEFT VENTRICLE PLAX 2D LVIDd:         3.60 cm     Diastology LVIDs:         2.70 cm     LV e' medial:    8.59 cm/s LV PW:         1.10 cm     LV E/e' medial:  10.0 LV IVS:        0.80 cm     LV e' lateral:   10.40 cm/s LVOT diam:     1.80 cm     LV E/e' lateral: 8.3 LV SV:         45 LV SV Index:   27 LVOT Area:     2.54 cm  LV Volumes (MOD) LV vol d, MOD A2C: 56.9 ml LV vol d, MOD A4C: 70.0 ml LV vol s, MOD A2C: 17.5 ml LV vol s, MOD A4C: 20.8 ml LV SV MOD A2C:     39.4 ml LV SV MOD A4C:     70.0 ml LV SV MOD BP:      45.1 ml RIGHT VENTRICLE             IVC RV Basal diam:  4.00 cm     IVC diam:  1.10 cm RV S prime:     19.30 cm/s TAPSE (M-mode): 2.2 cm LEFT ATRIUM             Index        RIGHT ATRIUM           Index LA diam:        2.30 cm 1.37 cm/m   RA Area:     11.70 cm LA Vol (A2C):   22.6 ml 13.48 ml/m  RA Volume:   30.70 ml  18.32 ml/m LA Vol (A4C):   26.3 ml 15.69 ml/m LA Biplane Vol: 24.6 ml 14.68 ml/m  AORTIC VALVE AV Area (Vmax):    1.91 cm AV Area (Vmean):   1.85 cm AV Area (VTI):     1.86 cm AV Vmax:           148.50 cm/s AV Vmean:          98.200 cm/s AV VTI:            0.242 m AV Peak Grad:      8.8 mmHg AV Mean Grad:      4.5 mmHg LVOT Vmax:         111.45 cm/s LVOT Vmean:        71.350 cm/s LVOT VTI:          0.177 m LVOT/AV VTI ratio: 0.73  AORTA Ao Root diam: 3.10 cm Ao Asc diam:  3.40 cm MITRAL VALVE               TRICUSPID VALVE MV Area (PHT): 2.56 cm    TR Peak grad:   24.4 mmHg  MV Area VTI:   2.14 cm    TR Vmax:        247.00 cm/s MV Peak grad:  4.7 mmHg MV Mean grad:  2.0 mmHg    SHUNTS MV Vmax:       1.08 m/s    Systemic VTI:  0.18 m MV Vmean:      65.9 cm/s   Systemic Diam: 1.80 cm MV Decel Time: 296 msec MV E velocity: 86.30 cm/s MV A velocity: 92.50 cm/s MV E/A ratio:  0.93 Mihai Croitoru MD Electronically signed by Thurmon Fair MD Signature Date/Time: 03/29/2023/9:56:43 AM    Final    DG CHEST PORT 1 VIEW  Result Date: 03/28/2023 CLINICAL DATA:  Increased shortness of breath EXAM: PORTABLE CHEST 1 VIEW COMPARISON:  03/28/2023, CT 02/11/2023 FINDINGS: Right-sided central venous port tip at the cavoatrial region. Bilateral pulmonary nodules corresponding to history of metastatic disease. At least moderate sized right pleural effusion which may be redistributed. Diffuse ground-glass opacity in the right thorax potentially due to edema. No convincing pneumothorax. Persistent consolidation at the right base. IMPRESSION: 1. Bilateral pulmonary nodules corresponding to history of metastatic disease. 2. At least moderate sized right pleural effusion which may be  redistributed or slightly increased since exam earlier today. Diffuse ground-glass opacity in the right thorax appears worsened and may be due to asymmetric edema. Electronically Signed   By: Jasmine Pang M.D.   On: 03/28/2023 21:37   VAS [place] LOWER EXTREMITY VENOUS (DVT)  Result Date: 03/28/2023  Lower Venous DVT Study Patient Name:  Tammie Gilmore  Date of Exam:   03/28/2023 Medical Rec #: [id number]          Accession #:    [id number] Date of Birth: 09/15/1966         Patient Gender: F Patient Age:   52 years Exam Location:  [facility] Procedure:      VAS [place] LOWER EXTREMITY VENOUS (DVT) Referring Phys: DAVID ORTIZ --------------------------------------------------------------------------------  Indications: Pulmonary embolism.  Risk Factors: Confirmed PE Cancer. Anticoagulation: Heparin. Comparison Study: No prior studies. Performing Technologist: Chanda Busing RVT  Examination Guidelines: A complete evaluation includes B-mode imaging, spectral Doppler, color Doppler, and power Doppler as needed of all accessible portions of each vessel. Bilateral testing is considered an integral part of a complete examination. Limited examinations for reoccurring indications may be performed as noted. The reflux portion of the exam is performed with the patient in reverse Trendelenburg.  +---------+---------------+---------+-----------+----------+--------------+ RIGHT    CompressibilityPhasicitySpontaneityPropertiesThrombus Aging +---------+---------------+---------+-----------+----------+--------------+ CFV      Full           Yes      Yes                                 +---------+---------------+---------+-----------+----------+--------------+ SFJ      Full                                                        +---------+---------------+---------+-----------+----------+--------------+ FV Prox  Full                                                         +---------+---------------+---------+-----------+----------+--------------+  FV Mid   Full                                                        +---------+---------------+---------+-----------+----------+--------------+ FV DistalFull                                                        +---------+---------------+---------+-----------+----------+--------------+ PFV      Full                                                        +---------+---------------+---------+-----------+----------+--------------+ POP      Full           Yes      Yes                                 +---------+---------------+---------+-----------+----------+--------------+ PTV      Full                                                        +---------+---------------+---------+-----------+----------+--------------+ PERO     Full                                                        +---------+---------------+---------+-----------+----------+--------------+   +---------+---------------+---------+-----------+----------+--------------+ LEFT     CompressibilityPhasicitySpontaneityPropertiesThrombus Aging +---------+---------------+---------+-----------+----------+--------------+ CFV      Full           Yes      Yes                                 +---------+---------------+---------+-----------+----------+--------------+ SFJ      Full                                                        +---------+---------------+---------+-----------+----------+--------------+ FV Prox  Full                                                        +---------+---------------+---------+-----------+----------+--------------+ FV Mid   Full                                                        +---------+---------------+---------+-----------+----------+--------------+  FV DistalFull                                                         +---------+---------------+---------+-----------+----------+--------------+ PFV      Full                                                        +---------+---------------+---------+-----------+----------+--------------+ POP      Full           Yes      Yes                                 +---------+---------------+---------+-----------+----------+--------------+ PTV      Full                                                        +---------+---------------+---------+-----------+----------+--------------+ PERO     Full                                                        +---------+---------------+---------+-----------+----------+--------------+     Summary: RIGHT: - There is no evidence of deep vein thrombosis in the lower extremity.  - No cystic structure found in the popliteal fossa.  LEFT: - There is no evidence of deep vein thrombosis in the lower extremity.  - No cystic structure found in the popliteal fossa.  *See table(s) above for measurements and observations. Electronically signed by Sherald Hess MD on 03/28/2023 at 5:36:09 PM.    Final    US THORACENTESIS ASP PLEURAL SPACE W/IMG GUIDE  Result Date: 03/28/2023 INDICATION: Patient with history of dyspnea, pulmonary embolus, bilateral pleural effusions right greater than left. Request received for diagnostic and therapeutic right thoracentesis. EXAM: ULTRASOUND GUIDED DIAGNOSTIC AND THERAPEUTIC RIGHT THORACENTESIS MEDICATIONS: 8 mL of 1% lidocaine COMPLICATIONS: None immediate. PROCEDURE: An ultrasound guided thoracentesis was thoroughly discussed with the patient and questions answered. The benefits, risks, alternatives and complications were also discussed. The patient understands and wishes to proceed with the procedure. Written consent was obtained. Ultrasound was performed to localize and mark an adequate pocket of fluid in the right chest. The area was then prepped and draped in the normal sterile fashion. 1%  Lidocaine was used for local anesthesia. Under ultrasound guidance a 6 Fr Safe-T-Centesis catheter was introduced. Thoracentesis was performed. The catheter was removed and a dressing applied. FINDINGS: A total of approximately 1.6 liters of clear, yellow fluid was removed. Samples were sent to the laboratory as requested by the clinical team. IMPRESSION: Successful ultrasound guided diagnostic and therapeutic right thoracentesis yielding 1.6 liters of pleural fluid. Performed by: Artemio Aly Electronically Signed   By: Simonne Come M.D.   On: 03/28/2023 17:05   DG Chest Port 1 View  Result Date: 03/28/2023 CLINICAL DATA:  Post right-sided thoracentesis. EXAM: PORTABLE CHEST 1 VIEW COMPARISON:  03/27/2023; chest CT-02/11/2023 FINDINGS: Interval reduction in persistent moderate sized partially loculated right-sided pleural effusion post thoracentesis. Improved aeration of the right lung with residual right mid and lower lung atelectasis/collapse. Improved visualization of known a proximally 1.8 cm right upper lung pulmonary nodule. No pneumothorax. Similar appearance of the left lung including proximally 1.8 cm left lower lung pulmonary nodule. No evidence of edema. Grossly unchanged cardiac silhouette and mediastinal contours. Stable positioning of support apparatus. No acute osseous abnormalities. IMPRESSION: 1. Interval reduction in persistent moderate sized partially loculated right-sided pleural effusion post thoracentesis. No pneumothorax. 2. Improved aeration of the right lung with residual right mid and lower lung atelectasis/collapse. 3. Redemonstrated known bilateral pulmonary metastases. Electronically Signed   By: Simonne Come M.D.   On: 03/28/2023 16:59   DG Outside Films Chest  Result Date: 03/28/2023 This examination belongs to an outside facility and is stored here for comparison purposes only.  Contact the originating outside institution for any associated report or interpretation.  CT  OUTSIDE FILMS CHEST  Result Date: 03/28/2023 This examination belongs to an outside facility and is stored here for comparison purposes only.  Contact the originating outside institution for any associated report or interpretation.

## 2023-03-30 NOTE — Progress Notes (Signed)
Brief Nutrition Note  Visited with patient at bedside this AM. She reports eating went fairly well yesterday. Had a little of her lunch and dinner. Admits her appetite remains poor and she is mostly eating because she knows she needs to. Still hesitantly agreeable to try [facility]. Afraid the "animal protein" may upset her stomach. Reminded patient Molli Posey is plant based. Patient to try supplement today if feeling up to it.   Will continue to monitor.   Shelle Iron RD, LDN For contact information, refer to [facility].

## 2023-03-30 NOTE — Discharge Instructions (Signed)
Private pay resource:  http://medina.org/  ++++++++++++++++++++++++++++++++++++++++++++++++++++++++++++++++++++++++  Information on my medicine - ELIQUIS (apixaban)   Why was Eliquis prescribed for you? Eliquis was prescribed to treat blood clots that may have been found in the veins of your legs (deep vein thrombosis) or in your lungs (pulmonary embolism) and to reduce the risk of them occurring again.  What do You need to know about Eliquis ? The starting dose is 10 mg (two 5 mg tablets) taken TWICE daily for the FIRST SEVEN (7) DAYS, then on 04/07/2003  the dose is reduced to ONE 5 mg tablet taken TWICE daily.  Eliquis may be taken with or without food.   Try to take the dose about the same time in the morning and in the evening. If you have difficulty swallowing the tablet whole please discuss with your pharmacist how to take the medication safely.  Take Eliquis exactly as prescribed and DO NOT stop taking Eliquis without talking to the doctor who prescribed the medication.  Stopping may increase your risk of developing a new blood clot.  Refill your prescription before you run out.  After discharge, you should have regular check-up appointments with your healthcare provider that is prescribing your Eliquis.    What do you do if you miss a dose? If a dose of ELIQUIS is not taken at the scheduled time, take it as soon as possible on the same day and twice-daily administration should be resumed. The dose should not be doubled to make up for a missed dose.  Important Safety Information A possible side effect of Eliquis is bleeding. You should call your healthcare provider right away if you experience any of the following: Bleeding from an injury or your nose that does not stop. Unusual colored urine (red or dark brown) or unusual colored stools (red or black). Unusual bruising for unknown reasons. A serious fall or if you hit your head (even if there is no  bleeding).  Some medicines may interact with Eliquis and might increase your risk of bleeding or clotting while on Eliquis. To help avoid this, consult your healthcare provider or pharmacist prior to using any new prescription or non-prescription medications, including herbals, vitamins, non-steroidal anti-inflammatory drugs (NSAIDs) and supplements.  This website has more information on Eliquis (apixaban): http://www.eliquis.com/eliquis/home   +++++++++++++++++++++++++++++++++++++++++++++++++++++++++++++++++++++++++++++

## 2023-03-30 NOTE — Progress Notes (Signed)
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 Bertis Ruddy, peripheral neuropathy from chemotherapy, osteopenia, hemorrhoids, anxiety transferred from [facility] for progressive weakness over the past 3 weeks.  Patient was found to have a small left-sided large right-sided pleural effusion.  Transferred here for further management.   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 Transiently required BiPAP and IV Lasix Previous provider discussed with IR regarding pulmonary embolism but it is notably small Lower extremity Dopplers-negative for DVT Currently on heparin drip with eventual plans to transition to DOAC Echocardiogram-EF 65% without any evidence of strain. As needed nebs.  I-S/flutter valve.  Out of bed to chair  Consulted Pulm and Onc for their input.    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 Bertis Ruddy and at [facility]. On Chemo  Hypokalemia - As needed repletion   Questionable cystitis Continue ceftriaxone x3 days -follow sensitivities.  Last day 6/19   Normocytic anemia, likely of chronic disease Stable, likely  secondary to above malignancy No notable blood loss, bleeding, easy bruising   Protein-calorie malnutrition, severe (HCC) Appreciate nutrition recommendations to ensure appropriate caloric intake *Of note patient states intolerance to OTC protein drinks(diarrhea)  Will consult Palliative for long term GOC given her adv disease.   PT/OT   DVT prophylaxis: Heparin drip Code Status: Full Family Communication: Husband at bedside Status is: Inpatient On going eval for recurrent effusion       Diet Orders (From admission, onward)     Start     Ordered   03/29/23 0842  Diet regular Room service appropriate? Yes; Fluid consistency: Thin  Diet effective now       Question Answer Comment  Room service appropriate? Yes   Fluid consistency: Thin      03/29/23 0842            Subjective: Still sob with exertion,fatigue.     Examination:  General exam: Appears calm and comfortable ; weak frail Respiratory system: b/l rhonchi Cardiovascular system: sinus tach, S1 & S2 heard, RRR. No JVD, murmurs, rubs, gallops or clicks. No pedal edema. Gastrointestinal system: Abdomen is nondistended, soft and nontender. No organomegaly or masses felt. Normal bowel sounds heard. Central nervous system: Alert and oriented. No focal neurological deficits. Extremities: Symmetric 5 x 5 power. Skin: No rashes, lesions or ulcers Psychiatry: Judgement and insight appear normal. Mood & affect appropriate.  Objective: Vitals:   03/30/23 0300 03/30/23 0400 03/30/23 0500 03/30/23 0600  BP: 115/68 119/71 132/80 130/80  Pulse: 90 84 (!) 105 (!) 106  Resp: 17 20 (!) 26 (!) 28  Temp:  98.6 F (37 C)    TempSrc:  Oral    SpO2: 100% 98% 96% 97%  Weight:      Height:  Intake/Output Summary (Last 24 hours) at 03/30/2023 0727 Last data filed at 03/30/2023 0600 Gross per 24 hour  Intake 994.51 ml  Output 200 ml  Net 794.51 ml   Filed Weights   03/28/23 1145  Weight: 56.9 kg     Scheduled Meds:  Chlorhexidine Gluconate Cloth  6 each Topical Daily   feeding supplement (KATE FARMS STANDARD 1.4)  325 mL Oral BID BM   metoprolol succinate  25 mg Oral QHS   pantoprazole  40 mg Oral Daily   Continuous Infusions:  sodium chloride Stopped (03/29/23 1806)   cefTRIAXone (ROCEPHIN)  IV Stopped (03/29/23 1836)   heparin 1,250 Units/hr (03/30/23 0704)    Nutritional status Signs/Symptoms: mild fat depletion, severe muscle depletion Interventions: Refer to RD note for recommendations Body mass index is 19.07 kg/m.  Data Reviewed:   CBC: Recent Labs  Lab 03/28/23 1315 03/29/23 0454 03/30/23 0208  WBC 8.4 10.5 8.1  HGB 9.5* 11.0* 10.6*  HCT 30.7* 34.5* 33.5*  MCV 97.5 95.6 95.7  PLT 218 238 258   Basic Metabolic Panel: Recent Labs  Lab 03/28/23 1245 03/29/23 0454  NA 137 137  K 3.5 3.2*  CL 103 101  CO2 22 25  GLUCOSE 98 115*  BUN 7 6  CREATININE 0.67 0.66  CALCIUM 8.0* 8.1*  MG 1.7  --   PHOS 3.7  --    GFR: Estimated Creatinine Clearance: 70.5 mL/min (by C-G formula based on SCr of 0.66 mg/dL). Liver Function Tests: Recent Labs  Lab 03/28/23 1245 03/29/23 0454  AST 15 13*  ALT 11 9  ALKPHOS 62 55  BILITOT 0.8 1.1  PROT 5.4* 5.5*  ALBUMIN 2.5* 2.9*   No results for input(s): "LIPASE", "AMYLASE" in the last 168 hours. No results for input(s): "AMMONIA" in the last 168 hours. Coagulation Profile: No results for input(s): "INR", "PROTIME" in the last 168 hours. Cardiac Enzymes: No results for input(s): "CKTOTAL", "CKMB", "CKMBINDEX", "TROPONINI" in the last 168 hours. BNP (last 3 results) No results for input(s): "PROBNP" in the last 8760 hours. HbA1C: No results for input(s): "HGBA1C" in the last 72 hours. CBG: No results for input(s): "GLUCAP" in the last 168 hours. Lipid Profile: No results for input(s): "CHOL", "HDL", "LDLCALC", "TRIG", "CHOLHDL", "LDLDIRECT" in the last 72 hours. Thyroid Function Tests: No results for  input(s): "TSH", "T4TOTAL", "FREET4", "T3FREE", "THYROIDAB" in the last 72 hours. Anemia Panel: No results for input(s): "VITAMINB12", "FOLATE", "FERRITIN", "TIBC", "IRON", "RETICCTPCT" in the last 72 hours. Sepsis Labs: No results for input(s): "PROCALCITON", "LATICACIDVEN" in the last 168 hours.  No results found for this or any previous visit (from the past 240 hour(s)).       Radiology Studies: ECHOCARDIOGRAM COMPLETE  Result Date: 03/29/2023    ECHOCARDIOGRAM REPORT   Patient Name:   LABERTA EGELAND Date of Exam: 03/29/2023 Medical Rec #:  [id number]         Height:       68.0 in Accession #:    [id number]        Weight:       125.4 lb Date of Birth:  12-Oct-1965        BSA:          1.676 m Patient Age:    56 years          BP:           162/90 mmHg Patient Gender: F  HR:           106 bpm. Exam Location:  Inpatient Procedure: 2D Echo, Cardiac Doppler, Color Doppler and Strain Analysis Indications:    Pulmonary embolus  History:        Patient has prior history of Echocardiogram examinations, most                 recent 11/05/2022. Cancer, Signs/Symptoms:Dyspnea; Risk                 Factors:pulmonary embolism.  Sonographer:    Wallie Char Referring Phys: [id number] DAVID MANUEL ORTIZ  Sonographer Comments: Image acquisition challenging due to respiratory motion. Global longitudinal strain was attempted. IMPRESSIONS  1. Left ventricular ejection fraction, by estimation, is 65 to 70%. The left ventricle has normal function. The left ventricle has no regional wall motion abnormalities. Left ventricular diastolic parameters were normal.  2. Right ventricular systolic function is normal. The right ventricular size is normal. There is normal pulmonary artery systolic pressure. The estimated right ventricular systolic pressure is 27.4 mmHg.  3. The mitral valve is myxomatous. No evidence of mitral valve regurgitation. No evidence of mitral stenosis. 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  4. The aortic valve is tricuspid. Aortic valve regurgitation is mild. No aortic stenosis is present.  5. The inferior vena cava is normal in size with greater than 50% respiratory variability, suggesting right atrial pressure of 3 mmHg. Comparison(s): Prior images reviewed side by side. The left ventricular function has improved. The right ventricular systolic function has improved. FINDINGS  Left Ventricle: Left ventricular ejection fraction, by estimation, is 65 to 70%. The left ventricle has normal function. The left ventricle has no regional wall motion abnormalities. Global longitudinal strain performed but not reported based on interpreter judgement due to suboptimal tracking. The left ventricular internal cavity size was normal in size. There is no left ventricular hypertrophy. Left ventricular diastolic parameters were normal. Right Ventricle: The right ventricular size is normal. No increase in right ventricular wall thickness. Right ventricular systolic function is normal. There is normal pulmonary artery systolic pressure. The tricuspid regurgitant velocity is 2.47 m/s, and  with an assumed right atrial pressure of 3 mmHg, the estimated right ventricular systolic pressure is 27.4 mmHg. Left Atrium: Left atrial size was normal in size. Right Atrium: Right atrial size was normal in size. Pericardium: There is no evidence of pericardial effusion. 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 No evidence of mitral valve regurgitation. No evidence of mitral valve stenosis. MV peak gradient, 4.7 mmHg. The mean mitral valve gradient is 2.0 mmHg. Tricuspid Valve: The tricuspid valve is normal in structure. Tricuspid valve regurgitation is mild . No evidence of tricuspid stenosis. Aortic Valve: The aortic valve is tricuspid. Aortic valve  regurgitation is mild. No aortic stenosis is present. Aortic valve mean gradient measures 4.5 mmHg. Aortic valve peak gradient measures 8.8 mmHg. Aortic valve area, by VTI measures 1.86 cm. Pulmonic Valve: The pulmonic valve was normal in structure. Pulmonic valve regurgitation is not visualized. No evidence of pulmonic stenosis. Aorta: The aortic root is normal in size and structure. Venous: The inferior vena cava is normal in size with greater than 50% respiratory variability, suggesting right atrial pressure of 3 mmHg. IAS/Shunts: No atrial level shunt detected by color flow Doppler.  LEFT VENTRICLE PLAX 2D  LVIDd:         3.60 cm     Diastology LVIDs:         2.70 cm     LV e' medial:    8.59 cm/s LV PW:         1.10 cm     LV E/e' medial:  10.0 LV IVS:        0.80 cm     LV e' lateral:   10.40 cm/s LVOT diam:     1.80 cm     LV E/e' lateral: 8.3 LV SV:         45 LV SV Index:   27 LVOT Area:     2.54 cm  LV Volumes (MOD) LV vol d, MOD A2C: 56.9 ml LV vol d, MOD A4C: 70.0 ml LV vol s, MOD A2C: 17.5 ml LV vol s, MOD A4C: 20.8 ml LV SV MOD A2C:     39.4 ml LV SV MOD A4C:     70.0 ml LV SV MOD BP:      45.1 ml RIGHT VENTRICLE             IVC RV Basal diam:  4.00 cm     IVC diam: 1.10 cm RV S prime:     19.30 cm/s TAPSE (M-mode): 2.2 cm LEFT ATRIUM             Index        RIGHT ATRIUM           Index LA diam:        2.30 cm 1.37 cm/m   RA Area:     11.70 cm LA Vol (A2C):   22.6 ml 13.48 ml/m  RA Volume:   30.70 ml  18.32 ml/m LA Vol (A4C):   26.3 ml 15.69 ml/m LA Biplane Vol: 24.6 ml 14.68 ml/m  AORTIC VALVE AV Area (Vmax):    1.91 cm AV Area (Vmean):   1.85 cm AV Area (VTI):     1.86 cm AV Vmax:           148.50 cm/s AV Vmean:          98.200 cm/s AV VTI:            0.242 m AV Peak Grad:      8.8 mmHg AV Mean Grad:      4.5 mmHg LVOT Vmax:         111.45 cm/s LVOT Vmean:        71.350 cm/s LVOT VTI:          0.177 m LVOT/AV VTI ratio: 0.73  AORTA Ao Root diam: 3.10 cm Ao Asc diam:  3.40 cm MITRAL VALVE                TRICUSPID VALVE MV Area (PHT): 2.56 cm    TR Peak grad:   24.4 mmHg MV Area VTI:   2.14 cm    TR Vmax:        247.00 cm/s MV Peak grad:  4.7 mmHg MV Mean grad:  2.0 mmHg    SHUNTS MV Vmax:       1.08 m/s    Systemic VTI:  0.18 m MV Vmean:      65.9 cm/s   Systemic Diam: 1.80 cm MV Decel Time: 296 msec MV E velocity: 86.30 cm/s MV A velocity: 92.50 cm/s MV E/A ratio:  0.93 Mihai Croitoru MD Electronically signed by Thurmon Fair MD Signature Date/Time: 03/29/2023/9:56:43 AM  Final    DG CHEST PORT 1 VIEW  Result Date: 03/28/2023 CLINICAL DATA:  Increased shortness of breath EXAM: PORTABLE CHEST 1 VIEW COMPARISON:  03/28/2023, CT 02/11/2023 FINDINGS: Right-sided central venous port tip at the cavoatrial region. Bilateral pulmonary nodules corresponding to history of metastatic disease. At least moderate sized right pleural effusion which may be redistributed. Diffuse ground-glass opacity in the right thorax potentially due to edema. No convincing pneumothorax. Persistent consolidation at the right base. IMPRESSION: 1. Bilateral pulmonary nodules corresponding to history of metastatic disease. 2. At least moderate sized right pleural effusion which may be redistributed or slightly increased since exam earlier today. Diffuse ground-glass opacity in the right thorax appears worsened and may be due to asymmetric edema. Electronically Signed   By: Jasmine Pang M.D.   On: 03/28/2023 21:37   VAS [place] LOWER EXTREMITY VENOUS (DVT)  Result Date: 03/28/2023  Lower Venous DVT Study Patient Name:  MAELYS HULEN  Date of Exam:   03/28/2023 Medical Rec #: [id number]          Accession #:    [id number] Date of Birth: 1966/07/29         Patient Gender: F Patient Age:   70 years Exam Location:  [facility] Procedure:      VAS [place] LOWER EXTREMITY VENOUS (DVT) Referring Phys: DAVID ORTIZ --------------------------------------------------------------------------------  Indications: Pulmonary embolism.   Risk Factors: Confirmed PE Cancer. Anticoagulation: Heparin. Comparison Study: No prior studies. Performing Technologist: Chanda Busing RVT  Examination Guidelines: A complete evaluation includes B-mode imaging, spectral Doppler, color Doppler, and power Doppler as needed of all accessible portions of each vessel. Bilateral testing is considered an integral part of a complete examination. Limited examinations for reoccurring indications may be performed as noted. The reflux portion of the exam is performed with the patient in reverse Trendelenburg.  +---------+---------------+---------+-----------+----------+--------------+ RIGHT    CompressibilityPhasicitySpontaneityPropertiesThrombus Aging +---------+---------------+---------+-----------+----------+--------------+ CFV      Full           Yes      Yes                                 +---------+---------------+---------+-----------+----------+--------------+ SFJ      Full                                                        +---------+---------------+---------+-----------+----------+--------------+ FV Prox  Full                                                        +---------+---------------+---------+-----------+----------+--------------+ FV Mid   Full                                                        +---------+---------------+---------+-----------+----------+--------------+ FV DistalFull                                                        +---------+---------------+---------+-----------+----------+--------------+  PFV      Full                                                        +---------+---------------+---------+-----------+----------+--------------+ POP      Full           Yes      Yes                                 +---------+---------------+---------+-----------+----------+--------------+ PTV      Full                                                         +---------+---------------+---------+-----------+----------+--------------+ PERO     Full                                                        +---------+---------------+---------+-----------+----------+--------------+   +---------+---------------+---------+-----------+----------+--------------+ LEFT     CompressibilityPhasicitySpontaneityPropertiesThrombus Aging +---------+---------------+---------+-----------+----------+--------------+ CFV      Full           Yes      Yes                                 +---------+---------------+---------+-----------+----------+--------------+ SFJ      Full                                                        +---------+---------------+---------+-----------+----------+--------------+ FV Prox  Full                                                        +---------+---------------+---------+-----------+----------+--------------+ FV Mid   Full                                                        +---------+---------------+---------+-----------+----------+--------------+ FV DistalFull                                                        +---------+---------------+---------+-----------+----------+--------------+ PFV      Full                                                        +---------+---------------+---------+-----------+----------+--------------+  POP      Full           Yes      Yes                                 +---------+---------------+---------+-----------+----------+--------------+ PTV      Full                                                        +---------+---------------+---------+-----------+----------+--------------+ PERO     Full                                                        +---------+---------------+---------+-----------+----------+--------------+     Summary: RIGHT: - There is no evidence of deep vein thrombosis in the lower extremity.  - No cystic structure found in  the popliteal fossa.  LEFT: - There is no evidence of deep vein thrombosis in the lower extremity.  - No cystic structure found in the popliteal fossa.  *See table(s) above for measurements and observations. Electronically signed by Sherald Hess MD on 03/28/2023 at 5:36:09 PM.    Final    US THORACENTESIS ASP PLEURAL SPACE W/IMG GUIDE  Result Date: 03/28/2023 INDICATION: Patient with history of dyspnea, pulmonary embolus, bilateral pleural effusions right greater than left. Request received for diagnostic and therapeutic right thoracentesis. EXAM: ULTRASOUND GUIDED DIAGNOSTIC AND THERAPEUTIC RIGHT THORACENTESIS MEDICATIONS: 8 mL of 1% lidocaine COMPLICATIONS: None immediate. PROCEDURE: An ultrasound guided thoracentesis was thoroughly discussed with the patient and questions answered. The benefits, risks, alternatives and complications were also discussed. The patient understands and wishes to proceed with the procedure. Written consent was obtained. Ultrasound was performed to localize and mark an adequate pocket of fluid in the right chest. The area was then prepped and draped in the normal sterile fashion. 1% Lidocaine was used for local anesthesia. Under ultrasound guidance a 6 Fr Safe-T-Centesis catheter was introduced. Thoracentesis was performed. The catheter was removed and a dressing applied. FINDINGS: A total of approximately 1.6 liters of clear, yellow fluid was removed. Samples were sent to the laboratory as requested by the clinical team. IMPRESSION: Successful ultrasound guided diagnostic and therapeutic right thoracentesis yielding 1.6 liters of pleural fluid. Performed by: Artemio Aly Electronically Signed   By: Simonne Come M.D.   On: 03/28/2023 17:05   DG Chest Port 1 View  Result Date: 03/28/2023 CLINICAL DATA:  Post right-sided thoracentesis. EXAM: PORTABLE CHEST 1 VIEW COMPARISON:  03/27/2023; chest CT-02/11/2023 FINDINGS: Interval reduction in persistent moderate sized  partially loculated right-sided pleural effusion post thoracentesis. Improved aeration of the right lung with residual right mid and lower lung atelectasis/collapse. Improved visualization of known a proximally 1.8 cm right upper lung pulmonary nodule. No pneumothorax. Similar appearance of the left lung including proximally 1.8 cm left lower lung pulmonary nodule. No evidence of edema. Grossly unchanged cardiac silhouette and mediastinal contours. Stable positioning of support apparatus. No acute osseous abnormalities. IMPRESSION: 1. Interval reduction in persistent moderate sized partially loculated right-sided pleural effusion post thoracentesis. No pneumothorax. 2. Improved aeration of the right lung with residual right mid and  lower lung atelectasis/collapse. 3. Redemonstrated known bilateral pulmonary metastases. Electronically Signed   By: Simonne Come M.D.   On: 03/28/2023 16:59           LOS: 2 days   Time spent= 35 mins    Avnoor Koury Joline Maxcy, MD [facility]  If 7PM-7AM, please contact night-coverage  03/30/2023, 7:27 AM

## 2023-03-30 NOTE — Consult Note (Signed)
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  Went to [facility] ED with progressive dyspnea.  Noted to be hypoxemic in the 80s pulse in the 140s.  CTA PE protocol personally reviewed reveals enlarging and now very large right and new left small pleural effusions.  Measurements of previous seen nodules on CT scan on my review 02/2023 have increased in size.  Demonstrates left-sided PE.  She was started on anticoagulation.  Transferred here for further evaluation.  She underwent thoracentesis 03/28/2023.  Over 1.5 L removed.  Stopped due to coughing per patient report.  She feels like her breathing is a bit improved since then.  No cytology sent, no lights criteria sent.  Cell differential on my review appears normal, notably typically lymphocytic with malignant effusions.  Pertinent  Medical History  Metastatic uterine leiomyosarcoma  Significant Hospital Events: Including procedures, antibiotic start and stop dates in addition to other pertinent events   03/26/2023 admitted from outside hospital ED with shortness of breath, small PE, enlarging right greater than left pleural effusion  Interim History / Subjective:    Objective   Blood pressure (!) 147/83, pulse (!) 114, temperature 98.3 F (36.8 C), temperature source Oral, resp. rate (!) 32, height 5\' 8"  (1.727 m), weight 56.9 kg, SpO2 99 %.        Intake/Output Summary (Last 24 hours) at 03/30/2023 1559 Last data filed at 03/30/2023 1553 Gross per 24 hour  Intake 569.48 ml  Output --  Net 569.48 ml   Filed  Weights   03/28/23 1145  Weight: 56.9 kg    Examination: General: Chronically ill-appearing, lying in bed Eyes: EOMI, no icterus Neck: Range of motion intact, no JVP appreciated sitting upright Lungs: Normal work of breathing, on nasal cannula Cardiovascular: Tachycardic, regular rhythm Abdomen: Nondistended MSK: No synovitis, no joint effusions Neuro: Seen moving all extremities, sensation appears intact Psych: Normal mood, full affect  Resolved Hospital Problem list     Assessment & Plan:  Pulmonary embolus: Small, likely some contribution to symptoms. -- Continue heparin GTT while sorting out procedures in the coming days -- Eliquis versus consideration of Lovenox as an outpatient, with her effusion and likely need for intervention on this again in the future, anticoagulation complicates the timing of any such intervention  Pleural effusion: Large on the right and present in the past.  Enlarging.  Suspect malignant.  No lights criteria, curiously and unexpectedly for malignant effusion as presumed, cell differential is fairly normal from recent thoracentesis. -- IV Lasix challenge over the next couple of days then reassess -- Intermittent chest x-ray, primarily driven by symptoms is preferred -- Suspect she would benefit from additional thoracentesis and was shared with patient, she wishes to avoid for now but realistically expect she will need additional intervention this hospitalization  Best Practice (right click and "Reselect all SmartList Selections" daily)   Per primary  Labs   CBC: Recent Labs  Lab 03/28/23 1315 03/29/23 0454 03/30/23 0208  WBC 8.4 10.5 8.1  HGB 9.5* 11.0* 10.6*  HCT 30.7* 34.5* 33.5*  MCV 97.5 95.6 95.7  PLT  218 238 258    Basic Metabolic Panel: Recent Labs  Lab 03/28/23 1245 03/29/23 0454 03/30/23 0744  NA 137 137 139  K 3.5 3.2* 3.1*  CL 103 101 103  CO2 22 25 26   GLUCOSE 98 115* 106*  BUN 7 6 7   CREATININE 0.67 0.66 0.55   CALCIUM 8.0* 8.1* 8.1*  MG 1.7  --   --   PHOS 3.7  --  3.5   GFR: Estimated Creatinine Clearance: 70.5 mL/min (by C-G formula based on SCr of 0.55 mg/dL). Recent Labs  Lab 03/28/23 1315 03/29/23 0454 03/30/23 0208 03/30/23 0744  PROCALCITON  --   --   --  0.32  WBC 8.4 10.5 8.1  --     Liver Function Tests: Recent Labs  Lab 03/28/23 1245 03/29/23 0454  AST 15 13*  ALT 11 9  ALKPHOS 62 55  BILITOT 0.8 1.1  PROT 5.4* 5.5*  ALBUMIN 2.5* 2.9*   No results for input(s): "LIPASE", "AMYLASE" in the last 168 hours. No results for input(s): "AMMONIA" in the last 168 hours.  ABG No results found for: "PHART", "PCO2ART", "PO2ART", "HCO3", "TCO2", "ACIDBASEDEF", "O2SAT"   Coagulation Profile: No results for input(s): "INR", "PROTIME" in the last 168 hours.  Cardiac Enzymes: No results for input(s): "CKTOTAL", "CKMB", "CKMBINDEX", "TROPONINI" in the last 168 hours.  HbA1C: No results found for: "HGBA1C"  CBG: No results for input(s): "GLUCAP" in the last 168 hours.  Review of Systems:   She denies chest pain.  She denies orthopnea or PND.  Comprehensive review of systems otherwise negative.  Past Medical History:  She,  has a past medical history of History of radiation therapy, History of radiation therapy, Hypoglycemia, IBS (irritable bowel syndrome), and Pathologic fracture of lumbar vertebra (02/04/2022).   Surgical History:   Past Surgical History:  Procedure Laterality Date   HERNIA REPAIR  1994   left inguinal, with mesh   IR BONE TUMOR(S)RF ABLATION  08/26/2022   IR IMAGING GUIDED PORT INSERTION  08/13/2021   IR KYPHO LUMBAR INC FX REDUCE BONE BX UNI/BIL CANNULATION INC/IMAGING  08/26/2022   IR RADIOLOGIST EVAL & MGMT  08/03/2022   IR RADIOLOGIST EVAL & MGMT  09/09/2022     Social History:   reports that she has never smoked. She has never used smokeless tobacco. She reports that she does not drink alcohol and does not use drugs.   Family History:   Her family history includes Endometriosis in her mother; Heart attack in her father and mother; Heart disease in her father and mother. There is no history of Cancer - Colon, Breast cancer, Cancer, Ovarian cancer, Uterine cancer, Pancreatic cancer, Pancreatic disease, or Prostate cancer.   Allergies Allergies  Allergen Reactions   Doxycycline Nausea Only and Other (See Comments)    Dizziness     Home Medications  Prior to Admission medications   Medication Sig Start Date End Date Taking? Authorizing Provider  acetaminophen (TYLENOL) 500 MG tablet Take 1,000 mg by mouth every 6 (six) hours as needed for moderate pain or headache.   Yes [provider]  albuterol (VENTOLIN HFA) 108 (90 Base) MCG/ACT inhaler Inhale 2 puffs into the lungs every 4 (four) hours as needed for wheezing or shortness of breath.   Yes [provider]  calcium carbonate (TUMS - DOSED IN MG ELEMENTAL CALCIUM) 500 MG chewable tablet Chew 1,000 mg by mouth as needed for heartburn or indigestion.   Yes [provider]  lidocaine-prilocaine (EMLA)  cream Apply to affected area once Patient taking differently: Apply 1 Application topically as needed (port access). 05/31/22  Yes Gorsuch, Ni, MD  loratadine (CLARITIN) 10 MG tablet Take 10 mg by mouth daily as needed for allergies.   Yes [provider]  metoprolol succinate (TOPROL XL) 25 MG 24 hr tablet Take 1 tablet (25 mg total) by mouth at bedtime. 09/28/22  Yes Gorsuch, Ni, MD  ondansetron (ZOFRAN) 8 MG tablet Take 1 tablet (8 mg total) by mouth every 8 (eight) hours as needed. Patient taking differently: Take 8 mg by mouth every 8 (eight) hours as needed for nausea or vomiting. 08/17/21  Yes Artis Delay, MD  prochlorperazine (COMPAZINE) 10 MG tablet Take 1 tablet (10 mg total) by mouth every 6 (six) hours as needed (Nausea or vomiting). 09/29/21  Yes Artis Delay, MD  senna (SENOKOT) 8.6 MG TABS tablet Take 2 tablets by mouth as needed for  mild constipation or moderate constipation.   Yes [provider]  dexamethasone (DECADRON) 4 MG tablet Take 0.5 tablets (2 mg total) by mouth daily. Patient not taking: Reported on 03/28/2023 10/28/22   Artis Delay, MD  estradiol (ESTRACE) 0.1 MG/GM vaginal cream PLACE FINGER TIP SIZE AMOUNT OF CREAM AND INSERT SLIGHTLY PAST THE VAGINAL ENTRANCE 3 TIMES DAILY Patient not taking: Reported on 03/28/2023 04/09/22   Artis Delay, MD  magic mouthwash (nystatin, diphenhydrAMINE, alum & mag hydroxide) suspension mixture Swish and spit 5 mLs 4 (four) times daily as needed for mouth pain. Patient not taking: Reported on 03/28/2023 08/10/22   Artis Delay, MD  OLANZapine (ZYPREXA) 5 MG tablet Take by mouth. Patient not taking: Reported on 03/28/2023 03/09/23 03/08/24  [provider]  pantoprazole (PROTONIX) 20 MG tablet Take by mouth. Patient not taking: Reported on 03/28/2023 03/10/23 03/30/23  [provider]     Critical care time: n/a    Karren Burly, MD See Loretha Stapler

## 2023-03-30 NOTE — TOC Initial Note (Signed)
Transition of Care [facility]) - Initial/Assessment Note    Patient Details  Name: Tammie Gilmore MRN: [id number] Date of Birth: 02-16-1966  [facility] CM/SW Contact:    Lavenia Atlas, RN Phone Number: 03/30/2023, 6:21 PM  Clinical Narrative:  Per chart review patient currently in [facility] SDU for pulmonary embolism. Spoke with patient and husband at bedside, patient reports no current DME or HH services. Patient's husband Billey Co inquiring about potential assistance with MD appointments and assistance for patient once he returns to work. This RN CM recommend private duty nursing which is an out of pocket expense. This RN CM explained HH services usually provides HHPT/OT, HHRN possibly 1-2 times per week. This RNCM will follow up and provide resources on AVS.  TOC will continue to follow for dc needs.   Expected Discharge Plan: Home/Self Care Barriers to Discharge: Continued Medical Work up   Patient Goals and CMS Choice Patient states their goals for this hospitalization and ongoing recovery are:: feel better CMS Medicare.gov Compare Post Acute Care list provided to:: Patient Choice offered to / list presented to : Patient [place] ownership interest in [facility].provided to:: Patient    Expected Discharge Plan and Services In-house Referral: NA Discharge Planning Services: CM Consult Post Acute Care Choice: NA Living arrangements for the past 2 months: Single Family Home                 DME Arranged: N/A DME Agency: NA       HH Arranged: NA HH Agency: NA        Prior Living Arrangements/Services Living arrangements for the past 2 months: Single Family Home Lives with:: Spouse Patient language and need for interpreter reviewed:: Yes Do you feel safe going back to the place where you live?: Yes      Need for Family Participation in Patient Care: Yes (Comment) Care giver support system in place?: Yes (comment) Current home services: Other (comment)  (none) Criminal Activity/Legal Involvement Pertinent to Current Situation/Hospitalization: No - Comment as needed  Activities of Daily Living Home Assistive Devices/Equipment: None ADL Screening (condition at time of admission) Patient's cognitive ability adequate to safely complete daily activities?: Yes Is the patient deaf or have difficulty hearing?: No Does the patient have difficulty seeing, even when wearing glasses/contacts?: No Does the patient have difficulty concentrating, remembering, or making decisions?: No Patient able to express need for assistance with ADLs?: Yes Does the patient have difficulty dressing or bathing?: Yes Independently performs ADLs?: No Communication: Independent Dressing (OT): Needs assistance Is this a change from baseline?: Pre-admission baseline Grooming: Needs assistance Is this a change from baseline?: Pre-admission baseline Feeding: Independent Bathing: Needs assistance Is this a change from baseline?: Pre-admission baseline Toileting: Needs assistance Is this a change from baseline?: Pre-admission baseline In/Out Bed: Needs assistance Is this a change from baseline?: Pre-admission baseline Walks in Home: Needs assistance Is this a change from baseline?: Pre-admission baseline Does the patient have difficulty walking or climbing stairs?: Yes Weakness of Legs: Both Weakness of Arms/Hands: None  Permission Sought/Granted Permission sought to share information with : Case Manager Permission granted to share information with : Yes, Verbal Permission Granted  Share Information with NAME: Case Manager           Emotional Assessment Appearance:: Appears stated age Attitude/Demeanor/Rapport: Gracious Affect (typically observed): Accepting Orientation: : Oriented to Self, Oriented to Place, Oriented to  Time Alcohol / Substance Use: Not Applicable Psych Involvement: No (comment)  Admission  diagnosis:  Pulmonary embolism (HCC)  [I26.99] Patient Active Problem List   Diagnosis Date Noted   Malnutrition of moderate degree 03/29/2023   Pulmonary embolism (HCC) 03/28/2023   Normocytic anemia 03/28/2023   Protein-calorie malnutrition, severe (HCC) 03/28/2023   Malignant pleural effusion 03/28/2023   Heart failure with reduced ejection fraction (HCC) 11/12/2022   Metastasis to brain (HCC) 10/26/2022   Skull lesion 09/28/2022   Goals of care, counseling/discussion 08/24/2022   Osteopenia 08/04/2022   Hypocalcemia 06/15/2022   Anemia due to antineoplastic chemotherapy 06/15/2022   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/19/2022   Other constipation 02/15/2022   Metastasis to bone (HCC) 02/08/2022   Pathologic fracture of lumbar vertebra 02/04/2022   Physical debility 02/04/2022   Elevated liver enzymes 12/14/2021   Pancytopenia, acquired (HCC) 11/11/2021   Internal hemorrhoid 10/20/2021   Leukopenia due to antineoplastic chemotherapy (HCC) 09/29/2021   Mucositis due to antineoplastic therapy 09/09/2021   Cystitis 09/09/2021   Anxiety, generalized 08/17/2021   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(HCC) 08/11/2021   Uterine leiomyosarcoma (HCC) 08/06/2021   IBS (irritable bowel syndrome) 06/19/2021   Family history of coronary artery disease 01/24/2020   Intermittent palpitations 01/24/2020   Encounter for lipid screening for cardiovascular disease 01/24/2020   PCP:  Juliette Alcide, MD Pharmacy:   [facility] [phone] - [place], [place] - [address] AT [facility] [place] [address] [place] [address] Phone: [phone] Fax: [phone]  [facility] [address] [place] [address] Phone: [phone] Fax: [phone]     Social Determinants of Health (SDOH) Social History: SDOH Screenings   Food Insecurity: No Food Insecurity (03/28/2023)  Housing: Low Risk  (03/28/2023)  Transportation Needs: No Transportation Needs  (03/28/2023)  Utilities: Not At Risk (03/28/2023)  Depression (PHQ2-9): Low Risk  (11/08/2022)  Tobacco Use: Low Risk  (03/29/2023)   SDOH Interventions:     Readmission Risk Interventions    03/30/2023    6:08 PM  Readmission Risk Prevention Plan  Transportation Screening Complete  PCP or Specialist Appt within 5-7 Days Complete  Home Care Screening Complete  Medication Review (RN CM) Complete

## 2023-03-31 ENCOUNTER — Inpatient Hospital Stay (HOSPITAL_COMMUNITY): Payer: BC Managed Care – PPO

## 2023-03-31 DIAGNOSIS — Z515 Encounter for palliative care: Secondary | ICD-10-CM

## 2023-03-31 DIAGNOSIS — R4589 Other symptoms and signs involving emotional state: Secondary | ICD-10-CM

## 2023-03-31 DIAGNOSIS — Z7189 Other specified counseling: Secondary | ICD-10-CM

## 2023-03-31 DIAGNOSIS — C799 Secondary malignant neoplasm of unspecified site: Secondary | ICD-10-CM

## 2023-03-31 DIAGNOSIS — I2609 Other pulmonary embolism with acute cor pulmonale: Secondary | ICD-10-CM | POA: Diagnosis not present

## 2023-03-31 LAB — MAGNESIUM: Magnesium: 1.8 mg/dL (ref 1.7–2.4)

## 2023-03-31 LAB — BODY FLUID CELL COUNT WITH DIFFERENTIAL
Eos, Fluid: 0 %
Lymphs, Fluid: 29 %
Monocyte-Macrophage-Serous Fluid: 61 % (ref 50–90)
Neutrophil Count, Fluid: 10 % (ref 0–25)
Total Nucleated Cell Count, Fluid: 508 cu mm (ref 0–1000)

## 2023-03-31 LAB — BASIC METABOLIC PANEL
Anion gap: 5 (ref 5–15)
BUN: 6 mg/dL (ref 6–20)
CO2: 27 mmol/L (ref 22–32)
Calcium: 8.2 mg/dL — ABNORMAL LOW (ref 8.9–10.3)
Chloride: 104 mmol/L (ref 98–111)
Creatinine, Ser: 0.57 mg/dL (ref 0.44–1.00)
GFR, Estimated: >60 mL/min (ref >60)
Glucose, Bld: 109 mg/dL — ABNORMAL HIGH (ref 70–99)
Potassium: 4 mmol/L (ref 3.5–5.1)
Sodium: 136 mmol/L (ref 135–145)

## 2023-03-31 LAB — CBC
HCT: 36.7 % (ref 36.0–46.0)
Hemoglobin: 11.6 g/dL — ABNORMAL LOW (ref 12.0–15.0)
MCH: 30.2 pg (ref 26.0–34.0)
MCHC: 31.6 g/dL (ref 30.0–36.0)
MCV: 95.6 fL (ref 80.0–100.0)
Platelets: 263 10*3/uL (ref 150–400)
RBC: 3.84 MIL/uL — ABNORMAL LOW (ref 3.87–5.11)
RDW: 12.9 % (ref 11.5–15.5)
WBC: 8.5 10*3/uL (ref 4.0–10.5)
nRBC: 0 % (ref 0.0–0.2)

## 2023-03-31 LAB — HEPARIN LEVEL (UNFRACTIONATED): Heparin Unfractionated: 0.53 IU/mL (ref 0.30–0.70)

## 2023-03-31 LAB — LACTATE DEHYDROGENASE, PLEURAL OR PERITONEAL FLUID: LD, Fluid: 157 U/L — ABNORMAL HIGH (ref 3–23)

## 2023-03-31 LAB — PROTEIN, PLEURAL OR PERITONEAL FLUID: Total protein, fluid: 3.4 g/dL

## 2023-03-31 MED ORDER — APIXABAN 5 MG PO TABS: 5.0000 mg | ORAL_TABLET | Freq: Two times a day (BID) | ORAL | Status: DC

## 2023-03-31 MED ORDER — LIDOCAINE HCL 1 % IJ SOLN
INTRAMUSCULAR | Status: AC
  Filled 2023-03-31: qty 20

## 2023-03-31 MED ORDER — APIXABAN 5 MG PO TABS
10.0000 mg | ORAL_TABLET | Freq: Two times a day (BID) | ORAL | Status: DC
  Administered 2023-03-31 – 2023-04-04 (×8): 10 mg via ORAL
  Filled 2023-03-31 (×8): qty 2

## 2023-03-31 MED ORDER — SODIUM CHLORIDE 0.9% FLUSH
10.0000 mL | Freq: Two times a day (BID) | INTRAVENOUS | Status: DC
  Administered 2023-03-31 – 2023-04-04 (×7): 10 mL

## 2023-03-31 MED ORDER — SODIUM CHLORIDE 0.9% FLUSH: 10.0000 mL | INTRAVENOUS | Status: DC | PRN

## 2023-03-31 MED ORDER — FUROSEMIDE 10 MG/ML IJ SOLN
40.0000 mg | Freq: Two times a day (BID) | INTRAMUSCULAR | Status: DC
  Administered 2023-03-31 – 2023-04-04 (×9): 40 mg via INTRAVENOUS
  Filled 2023-03-31 (×9): qty 4

## 2023-03-31 MED ORDER — ALTEPLASE 2 MG IJ SOLR: 2.0000 mg | Freq: Once | INTRAMUSCULAR | Status: DC

## 2023-03-31 NOTE — Progress Notes (Signed)
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 Bertis Ruddy, peripheral neuropathy from chemotherapy, osteopenia, hemorrhoids, anxiety transferred from [facility] for progressive weakness over the past 3 weeks.  Patient was found to have a small left-sided large right-sided pleural effusion.  Transferred here for further management.  During the hospitalization patient had thoracentesis which improved her symptoms but still persist as this is likely malignant effusion.  Pulmonary and oncology team consulted.  Planning for repeat thoracentesis.   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  Off BiPAP, getting Lasix.  Will plan to repeat thoracentesis today Previous provider discussed with IR regarding pulmonary embolism but it is notably small Lower extremity Dopplers-negative for DVT Echocardiogram-EF 65% without any evidence of strain. As needed nebs.  I-S/flutter valve.  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  Will plan to repeat thoracentesis today.  Continue IV Lasix in the meantime as long as she  tolerates this.  I suspect this fluid may remain a recurrent issue. - Appreciate input from pulmonary and oncology.  Hypokalemia - As needed repletion   Questionable cystitis Finished 3 days of Rocephin   Normocytic anemia, likely of chronic disease Stable, due to chemotherapy   Protein-calorie malnutrition, severe (HCC) Appreciate nutrition recommendations to ensure appropriate caloric intake *Of note patient states intolerance to OTC protein drinks(diarrhea)  Will consult Palliative for long term GOC given her adv disease.   PT/OT   DVT prophylaxis: Heparin drip> Eliquis Code Status: Full Family Communication: Friend at bedside Status is: Inpatient Ongoing management for recurrent effusion on IV diuretics     Diet Orders (From admission, onward)     Start     Ordered   03/29/23 0842  Diet regular Room service appropriate? Yes; Fluid consistency: Thin  Diet effective now       Question Answer Comment  Room service appropriate? Yes   Fluid consistency: Thin      03/29/23 0842            Subjective: Doing ok, still sob at rest.   Examination: Constitutional: Not in acute distress; frail. 2L [place] Respiratory: b/l rhonchi Cardiovascular: Normal sinus rhythm, no rubs Abdomen: Nontender nondistended good bowel sounds Musculoskeletal: No edema noted Skin: No rashes seen Neurologic: CN 2-12 grossly intact.  And nonfocal Psychiatric: Normal judgment and insight. Alert and oriented x 3. Normal mood.    Objective: Vitals:   03/31/23 0412 03/31/23 0500 03/31/23 0600 03/31/23 0700  BP:  100/70 119/87 134/82  Pulse: (!) 107 85 (!) 105 (!) 101  Resp: (!) 26 17 (!) 27 (!) 28  Temp: 98.2 F (36.8 C)     TempSrc: Oral     SpO2: 96% 99% 100% 99%  Weight:  Height:        Intake/Output Summary (Last 24 hours) at 03/31/2023 0720 Last data filed at 03/31/2023 0000 Gross per 24 hour  Intake 224.91 ml  Output --  Net 224.91 ml   Filed Weights   03/28/23 1145   Weight: 56.9 kg    Scheduled Meds:  Chlorhexidine Gluconate Cloth  6 each Topical Daily   feeding supplement (KATE FARMS STANDARD 1.4)  325 mL Oral BID BM   furosemide  40 mg Intravenous Daily   metoprolol succinate  25 mg Oral QHS   pantoprazole  40 mg Oral Daily   Continuous Infusions:  sodium chloride Stopped (03/29/23 1806)   heparin 1,250 Units/hr (03/31/23 0406)    Nutritional status Signs/Symptoms: mild fat depletion, severe muscle depletion Interventions: Refer to RD note for recommendations Body mass index is 19.07 kg/m.  Data Reviewed:   CBC: Recent Labs  Lab 03/28/23 1315 03/29/23 0454 03/30/23 0208 03/31/23 0417  WBC 8.4 10.5 8.1 8.5  HGB 9.5* 11.0* 10.6* 11.6*  HCT 30.7* 34.5* 33.5* 36.7  MCV 97.5 95.6 95.7 95.6  PLT 218 238 258 263   Basic Metabolic Panel: Recent Labs  Lab 03/28/23 1245 03/29/23 0454 03/30/23 0744 03/31/23 0417  NA 137 137 139 136  K 3.5 3.2* 3.1* 4.0  CL 103 101 103 104  CO2 22 25 26 27   GLUCOSE 98 115* 106* 109*  BUN 7 6 7 6   CREATININE 0.67 0.66 0.55 0.57  CALCIUM 8.0* 8.1* 8.1* 8.2*  MG 1.7  --   --  1.8  PHOS 3.7  --  3.5  --    GFR: Estimated Creatinine Clearance: 70.5 mL/min (by C-G formula based on SCr of 0.57 mg/dL). Liver Function Tests: Recent Labs  Lab 03/28/23 1245 03/29/23 0454  AST 15 13*  ALT 11 9  ALKPHOS 62 55  BILITOT 0.8 1.1  PROT 5.4* 5.5*  ALBUMIN 2.5* 2.9*   No results for input(s): "LIPASE", "AMYLASE" in the last 168 hours. No results for input(s): "AMMONIA" in the last 168 hours. Coagulation Profile: No results for input(s): "INR", "PROTIME" in the last 168 hours. Cardiac Enzymes: No results for input(s): "CKTOTAL", "CKMB", "CKMBINDEX", "TROPONINI" in the last 168 hours. BNP (last 3 results) No results for input(s): "PROBNP" in the last 8760 hours. HbA1C: No results for input(s): "HGBA1C" in the last 72 hours. CBG: No results for input(s): "GLUCAP" in the last 168 hours. Lipid  Profile: No results for input(s): "CHOL", "HDL", "LDLCALC", "TRIG", "CHOLHDL", "LDLDIRECT" in the last 72 hours. Thyroid Function Tests: No results for input(s): "TSH", "T4TOTAL", "FREET4", "T3FREE", "THYROIDAB" in the last 72 hours. Anemia Panel: No results for input(s): "VITAMINB12", "FOLATE", "FERRITIN", "TIBC", "IRON", "RETICCTPCT" in the last 72 hours. Sepsis Labs: Recent Labs  Lab 03/30/23 0744  PROCALCITON 0.32    Recent Results (from the past 240 hour(s))  MRSA Next Gen by PCR, Nasal     Status: None   Collection Time: 03/30/23  4:08 PM   Specimen: Nasal Mucosa; Nasal Swab  Result Value Ref Range Status   MRSA by PCR Next Gen NOT DETECTED NOT DETECTED Final    Comment: (NOTE) The GeneXpert MRSA Assay (FDA approved for NASAL specimens only), is one component of a comprehensive MRSA colonization surveillance program. It is not intended to diagnose MRSA infection nor to guide or monitor treatment for MRSA infections. Test performance is not FDA approved in patients less than 71 years old. Performed at [facility], [address] [place], [place] [address]          Radiology Studies: [facility] Chest Port 1 View  Result Date: 03/30/2023 CLINICAL DATA:  Shortness of breath. EXAM: PORTABLE CHEST 1 VIEW COMPARISON:  03/28/2023 FINDINGS: Stable right IJ power port. Persistent large right pleural effusion and significant overlying atelectasis. Stable metastatic pulmonary nodules. IMPRESSION: Persistent large right pleural effusion and significant overlying atelectasis. Stable pulmonary metastatic disease. Electronically Signed   By: Rudie Meyer M.D.   On: 03/30/2023 16:18   ECHOCARDIOGRAM COMPLETE  Result Date: 03/29/2023    ECHOCARDIOGRAM REPORT   Patient Name:   Tammie Gilmore Date of Exam: 03/29/2023 Medical Rec #:  [id number]         Height:       68.0 in Accession #:    [id number]        Weight:       125.4 lb Date of Birth:  06-25-1966        BSA:           1.676 m Patient Age:    56 years          BP:           162/90 mmHg Patient Gender: F                 HR:           106 bpm. Exam Location:  Inpatient Procedure: 2D Echo, Cardiac Doppler, Color Doppler and Strain Analysis Indications:    Pulmonary embolus  History:        Patient has prior history of Echocardiogram examinations, most                 recent 11/05/2022. Cancer, Signs/Symptoms:Dyspnea; Risk                 Factors:pulmonary embolism.  Sonographer:    Wallie Char Referring Phys: [id number] DAVID MANUEL ORTIZ  Sonographer Comments: Image acquisition challenging due to respiratory motion. Global longitudinal strain was attempted. IMPRESSIONS  1. Left ventricular ejection fraction, by estimation, is 65 to 70%. The left ventricle has normal function. The left ventricle has no regional wall motion abnormalities. Left ventricular diastolic parameters were normal.  2. Right ventricular systolic function is normal. The right ventricular size is normal. There is normal pulmonary artery systolic pressure. The estimated right ventricular systolic pressure is 27.4 mmHg.  3. The mitral valve is myxomatous. No evidence of mitral valve regurgitation. No evidence of mitral stenosis. 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  4. The aortic valve is tricuspid. Aortic valve regurgitation is mild. No aortic stenosis is present.  5. The inferior vena cava is normal in size with greater than 50% respiratory variability, suggesting right atrial pressure of 3 mmHg. Comparison(s): Prior images reviewed side by side. The left ventricular function has improved. The right ventricular systolic function has improved. FINDINGS  Left Ventricle: Left ventricular ejection fraction, by estimation, is 65 to 70%. The left ventricle has normal function. The left ventricle has no regional wall motion abnormalities. Global longitudinal strain performed but not reported based on  interpreter judgement due to suboptimal tracking. The left ventricular internal cavity size was normal in size. There is no left ventricular hypertrophy. Left ventricular diastolic parameters were normal. Right Ventricle: The right ventricular size is normal. No increase in right ventricular wall thickness. Right ventricular systolic function is normal. There is normal pulmonary artery systolic pressure. The  tricuspid regurgitant velocity is 2.47 m/s, and  with an assumed right atrial pressure of 3 mmHg, the estimated right ventricular systolic pressure is 27.4 mmHg. Left Atrium: Left atrial size was normal in size. Right Atrium: Right atrial size was normal in size. Pericardium: There is no evidence of pericardial effusion. 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 No evidence of mitral valve regurgitation. No evidence of mitral valve stenosis. MV peak gradient, 4.7 mmHg. The mean mitral valve gradient is 2.0 mmHg. Tricuspid Valve: The tricuspid valve is normal in structure. Tricuspid valve regurgitation is mild . No evidence of tricuspid stenosis. Aortic Valve: The aortic valve is tricuspid. Aortic valve regurgitation is mild. No aortic stenosis is present. Aortic valve mean gradient measures 4.5 mmHg. Aortic valve peak gradient measures 8.8 mmHg. Aortic valve area, by VTI measures 1.86 cm. Pulmonic Valve: The pulmonic valve was normal in structure. Pulmonic valve regurgitation is not visualized. No evidence of pulmonic stenosis. Aorta: The aortic root is normal in size and structure. Venous: The inferior vena cava is normal in size with greater than 50% respiratory variability, suggesting right atrial pressure of 3 mmHg. IAS/Shunts: No atrial level shunt detected by color flow Doppler.  LEFT VENTRICLE PLAX 2D LVIDd:         3.60 cm     Diastology LVIDs:         2.70 cm     LV e' medial:    8.59 cm/s LV PW:          1.10 cm     LV E/e' medial:  10.0 LV IVS:        0.80 cm     LV e' lateral:   10.40 cm/s LVOT diam:     1.80 cm     LV E/e' lateral: 8.3 LV SV:         45 LV SV Index:   27 LVOT Area:     2.54 cm  LV Volumes (MOD) LV vol d, MOD A2C: 56.9 ml LV vol d, MOD A4C: 70.0 ml LV vol s, MOD A2C: 17.5 ml LV vol s, MOD A4C: 20.8 ml LV SV MOD A2C:     39.4 ml LV SV MOD A4C:     70.0 ml LV SV MOD BP:      45.1 ml RIGHT VENTRICLE             IVC RV Basal diam:  4.00 cm     IVC diam: 1.10 cm RV S prime:     19.30 cm/s TAPSE (M-mode): 2.2 cm LEFT ATRIUM             Index        RIGHT ATRIUM           Index LA diam:        2.30 cm 1.37 cm/m   RA Area:     11.70 cm LA Vol (A2C):   22.6 ml 13.48 ml/m  RA Volume:   30.70 ml  18.32 ml/m LA Vol (A4C):   26.3 ml 15.69 ml/m LA Biplane Vol: 24.6 ml 14.68 ml/m  AORTIC VALVE AV Area (Vmax):    1.91 cm AV Area (Vmean):   1.85 cm AV Area (VTI):     1.86 cm AV Vmax:           148.50 cm/s AV Vmean:  98.200 cm/s AV VTI:            0.242 m AV Peak Grad:      8.8 mmHg AV Mean Grad:      4.5 mmHg LVOT Vmax:         111.45 cm/s LVOT Vmean:        71.350 cm/s LVOT VTI:          0.177 m LVOT/AV VTI ratio: 0.73  AORTA Ao Root diam: 3.10 cm Ao Asc diam:  3.40 cm MITRAL VALVE               TRICUSPID VALVE MV Area (PHT): 2.56 cm    TR Peak grad:   24.4 mmHg MV Area VTI:   2.14 cm    TR Vmax:        247.00 cm/s MV Peak grad:  4.7 mmHg MV Mean grad:  2.0 mmHg    SHUNTS MV Vmax:       1.08 m/s    Systemic VTI:  0.18 m MV Vmean:      65.9 cm/s   Systemic Diam: 1.80 cm MV Decel Time: 296 msec MV E velocity: 86.30 cm/s MV A velocity: 92.50 cm/s MV E/A ratio:  0.93 Mihai Croitoru MD Electronically signed by Thurmon Fair MD Signature Date/Time: 03/29/2023/9:56:43 AM    Final            LOS: 3 days   Time spent= 35 mins    Rontae Inglett Joline Maxcy, MD [facility]  If 7PM-7AM, please contact night-coverage  03/31/2023, 7:20 AM

## 2023-03-31 NOTE — Progress Notes (Signed)
PT Cancellation Note  Patient Details Name: Catherine Mccreedy Zalewski MRN: [id number] DOB: [birth year]   Cancelled Treatment:    Reason Eval/Treat Not Completed: Other (comment)unable to see in AM for Thoracentesis.  Will check back tomorrow. Blanchard Kelch PT Acute Rehabilitation Services Office [phone] Weekend pager-[phone]    Rada Hay 03/31/2023, 3:53 PM

## 2023-03-31 NOTE — Consult Note (Signed)
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  Since admission, imaging has shown small PE and enlarging chronic right malignant pleural effusion.  Underwent thoracentesis on 6/17 for management.  Patient followed by [facility] oncology as well as local oncologist, Dr. Bertis Ruddy.  Palliative medicine team consulted to assist with complex medical decision making.  Extensive review of EMR prior to presenting to bedside.  Discussed care with IDT.  Presented to bedside to meet with patient.  Patient's friend present at bedside.  Patient gave permission to continue conversation with friend present.  Introduced myself and role of the palliative medicine team in patient's care.  Spent time learning about patient's medical journey up into this point.  Patient notes that she already met with her oncologist this morning and is planning to undergo another thoracentesis for dyspnea management.  Patient notes plan is for her to follow-up with her [facility] oncologist, Dr. Meredith Mody, next week to continue with chemotherapy as long as she is strong enough.  Patient able to describe that she has been getting out of bed and using bedside commode.  Encourage continued participation with physical activities in order to maintain strength for chemotherapy if that is her goal.  Spent time discussing symptom management.  Did educate that opioids can assist with dyspnea management particularly in the setting of  malignant pleural effusion.  With permission, expressed worry that with this effusion being malignant, will likely reoccur. Noted importance of continued discussions with care team members moving forward as her cancer journey continues to progress.  Provided emotional support via active listening.  Thanked patient for allowing me to visit with her today.  Discussed care with hospitalist after visit.  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 Antony Blackbird   History of radiation therapy    SBRT to iliac nodes; 09/14/22-09/24/22 Dr. Antony Blackbird   Hypoglycemia    occasional episodes of hypoglycemia   IBS (irritable bowel syndrome)    Pathologic fracture of lumbar vertebra 02/04/2022   Social History   Socioeconomic History   Marital status: Married    Spouse name: Not on file   Number of children: 3   Years of education: Not on file   Highest education level: Not on file  Occupational History   Occupation: accountant  Tobacco Use   Smoking status: Never   Smokeless tobacco: Never  Vaping Use   Vaping Use: Never used  Substance and Sexual Activity   Alcohol use: Never   Drug use: Never   Sexual activity: Not Currently  Other Topics Concern   Not on file  Social History Narrative   Not on  file   Social Determinants of Health   Financial Resource Strain: Not on file  Food Insecurity: No Food Insecurity (03/28/2023)   Hunger Vital Sign    Worried About Running Out of Food in the Last Year: Never true    Ran Out of Food in the Last Year: Never true  Transportation Needs: No  Transportation Needs (03/28/2023)   PRAPARE - Administrator, Civil Service (Medical): No    Lack of Transportation (Non-Medical): No  Physical Activity: Not on file  Stress: Not on file  Social Connections: Not on file   Family History  Problem Relation Age of Onset   Heart attack Mother    Heart disease Mother    Endometriosis Mother    Heart disease Father    Heart attack Father    Cancer - Colon Neg Hx    Breast cancer Neg Hx    Cancer Neg Hx    Ovarian cancer Neg Hx    Uterine cancer Neg Hx    Pancreatic cancer Neg Hx    Pancreatic disease Neg Hx    Prostate cancer Neg Hx    Scheduled Meds:  apixaban  10 mg Oral BID   Followed by   Melene Muller ON 04/07/2023] apixaban  5 mg Oral BID   Chlorhexidine Gluconate Cloth  6 each Topical Daily   feeding supplement (KATE FARMS STANDARD 1.4)  325 mL Oral BID BM   furosemide  40 mg Intravenous BID   metoprolol succinate  25 mg Oral QHS   pantoprazole  40 mg Oral Daily   Continuous Infusions:  sodium chloride Stopped (03/29/23 1806)   heparin 1,250 Units/hr (03/31/23 0406)   PRN Meds:.sodium chloride, acetaminophen **OR** acetaminophen, alum & mag hydroxide-simeth, guaiFENesin-dextromethorphan, hydrALAZINE, hydrocortisone, hydrocortisone cream, ipratropium-albuterol, lip balm, loratadine, metoprolol tartrate, Muscle Rub, ondansetron **OR** ondansetron (ZOFRAN) IV, mouth rinse, phenol, polyvinyl alcohol, senna, sodium chloride, traZODone Allergies  Allergen Reactions   Doxycycline Nausea Only and Other (See Comments)    Dizziness   CBC:    Component Value Date/Time   WBC 8.5 03/31/2023 0417   HGB 11.6 (L) 03/31/2023 0417   HGB 12.3 02/01/2023 0923   HCT 36.7 03/31/2023 0417   PLT 263 03/31/2023 0417   PLT 287 02/01/2023 0923   MCV 95.6 03/31/2023 0417   NEUTROABS 4.5 02/01/2023 0923   LYMPHSABS 0.6 (L) 02/01/2023 0923   MONOABS 0.5 02/01/2023 0923   EOSABS 0.1 02/01/2023 0923   BASOSABS 0.0 02/01/2023 0923    Comprehensive Metabolic Panel:    Component Value Date/Time   NA 136 03/31/2023 0417   K 4.0 03/31/2023 0417   CL 104 03/31/2023 0417   CO2 27 03/31/2023 0417   BUN 6 03/31/2023 0417   CREATININE 0.57 03/31/2023 0417   CREATININE 0.68 02/01/2023 0923   GLUCOSE 109 (H) 03/31/2023 0417   CALCIUM 8.2 (L) 03/31/2023 0417   AST 13 (L) 03/29/2023 0454   AST 13 (L) 02/01/2023 0923   ALT 9 03/29/2023 0454   ALT 8 02/01/2023 0923   ALKPHOS 55 03/29/2023 0454   BILITOT 1.1 03/29/2023 0454   BILITOT 0.4 02/01/2023 0923   PROT 5.5 (L) 03/29/2023 0454   ALBUMIN 2.9 (L) 03/29/2023 0454    Physical Exam: Vital Signs: BP 134/82   Pulse (!) 112   Temp 98.4 F (36.9 C) (Oral)   Resp (!) 30   Ht 5\' 8"  (1.727 m)   Wt 56.9 kg   SpO2 98%   BMI 19.07  kg/m  SpO2: SpO2: 98 % O2 Device: O2 Device: Nasal Cannula O2 Flow Rate: O2 Flow Rate (L/min): (S) 1 L/min Intake/output summary:  Intake/Output Summary (Last 24 hours) at 03/31/2023 0957 Last data filed at 03/31/2023 0000 Gross per 24 hour  Intake 224.91 ml  Output --  Net 224.91 ml   LBM: Last BM Date : 03/30/23 Baseline Weight: Weight: 56.9 kg Most recent weight: Weight: 56.9 kg  General: NAD, alert, laying in bed, cachetic, frail  Eyes: no drainage noted HENT: moist mucous membranes Cardiovascular: tachycardia noted Respiratory: slightly  increased work of breathing noted Abdomen: not distended Extremities: muscle wasting preset in all extremities  Neuro: A&Ox4, following commands easily Psych: appropriately answers all questions          Palliative Performance Scale: 50%              Additional Data Reviewed: Recent Labs    03/30/23 0208 03/30/23 0744 03/31/23 0417  WBC 8.1  --  8.5  HGB 10.6*  --  11.6*  PLT 258  --  263  NA  --  139 136  BUN  --  7 6  CREATININE  --  0.55 0.57    Imaging: DG Chest Port 1 View CLINICAL DATA:  Shortness of breath.  EXAM: PORTABLE CHEST 1 VIEW  COMPARISON:   03/28/2023  FINDINGS: Stable right IJ power port. Persistent large right pleural effusion and significant overlying atelectasis. Stable metastatic pulmonary nodules.  IMPRESSION: Persistent large right pleural effusion and significant overlying atelectasis.  Stable pulmonary metastatic disease.  Electronically Signed   By: Rudie Meyer M.D.   On: 03/30/2023 16:18    I personally reviewed recent imaging.   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  Since admission, imaging has shown small PE and enlarging chronic right malignant pleural effusion.  Underwent thoracentesis on 6/17 for management.  Patient followed by [facility] oncology as well as local oncologist, Dr. Bertis Ruddy.  Palliative medicine team consulted to assist with complex medical decision making.  # Complex medical decision making/goals of care  -Patient continues to discuss her cancer directed therapies with her local oncologist, Dr. Bertis Ruddy, and her [facility] oncologist Dr. Meredith Mody.  Patient hopeful to continue chemotherapy with Dr. Meredith Mody that is scheduled for next week.  Encouraged continued conversations with care team providers as cancer journey and continues to progress.  -Spent time providing education regarding involvement of palliative medicine team.   -  Code Status: Full Code   # Symptom management  -As per primary hosptialist. Did educate on use of opioids if needed for dyspnea management.   # Psycho-social/Spiritual Support:  - Support System: husband, friend  - Desire for further Chaplain support: educated patient about chaplain support being available if needed  # Discharge Planning:   Home   Thank you for allowing the palliative care team to participate in the care Tammie Gilmore.  Alvester Morin, DO Palliative Care Provider PMT # [phone]  If patient remains symptomatic despite maximum doses, please call PMT at [phone] between 0700 and 1900. Outside of these hours, please call attending, as PMT does not have night coverage.  This provider spent a total of 80 minutes providing  patient's care.  Includes review of EMR, discussing care with other staff members involved in patient's medical care, obtaining relevant history and information from patient and/or patient's family, and personal review of imaging and lab work. Greater than 50% of the time was spent counseling and coordinating care related to the above assessment and plan.    *Please note that this is a verbal dictation therefore any spelling or grammatical errors are due to the "[employer] One" system interpretation.

## 2023-03-31 NOTE — Procedures (Signed)
PROCEDURE SUMMARY:  Successful image-guided right thoracentesis. Yielded 1.3 L of hazy yellow fluid. Pt tolerated procedure well. No immediate complications. EBL = trace   Specimen was  sent for labs. CXR ordered.  Please see imaging section of Epic for full dictation.  Desiray Orchard H Torien Ramroop PA-C 03/31/2023 1:30 PM

## 2023-03-31 NOTE — Progress Notes (Signed)
Tammie Gilmore   DOB:03/14/1966   WU#:[id number]    ASSESSMENT & PLAN:  Metastatic leiomyosarcoma She is receiving treatment at [facility], due cycle 2 on 6/17, delayed due to admission Patient is interested to receive more treatment Continue supportive care   Right pleural effusion Likely malignant I recommend intermittent thoracentesis for now rather than Pleurx since patient has just started on new chemo 3 weeks ago and could respond favorably to treatment However, if she changes her mind, then Pleurx would be appropriate if she is transitioned to palliative care/hospice Her x-ray from yesterday was reviewed Her examination showed poor air entry on the right lung I recommend repeat thoracentesis again today and she ultimately agrees I do not believe furosemide is going to help   Recent changes in cardiac function, questionable fluid overload Her ECHO at [facility] suggest reduce EF just 10 days ago but repeat ECHo yesterday showed normal EF She felt better after recent thoracentesis and diuresis Recommend continue medical management and weaning off oxygen as tolerated   Pulmonary embolism Due to her active cancer. Recommend transition to NOAC if pulmonary team agree not to place Pleurx   Protein calorie malnutrition Possibly contributing to 3rd spacing She has very poor oral intake I recommend high-protein supplemental drinks as tolerated   Goals of care Overall she wants to "try" to receive more chemo "if she is strong enough" Overall, I think the patient has very poor understanding of her disease process and overall prognosis.  She was optimistic because of multiple treatments that were offered by her oncologist at [facility].  I will continue to address goals of care every day with her.  I felt that the patient has unrealistic expectation   Discharge planning It is unlikely she will be discharged this weekend given her overall decline   All questions were answered. The patient knows to call  the clinic with any problems, questions or concerns.   The total time spent in the appointment was 40 minutes encounter with patients including review of chart and various tests results, discussions about plan of care and coordination of care plan  Artis Delay, MD 03/31/2023 8:08 AM  Subjective:  She is seen in the room.  Her best friend is by the bedside.  The patient is lying on her right side.  She does not appear to be too tachypneic but vital signs suggestive of moderate tachycardia. I reviewed chest x-ray findings.  Her examination revealed poor breathing sounds on the right. We discussed the risk and benefits of thoracentesis.  I advised the patient on changes on oral intake.  Objective:  Vitals:   03/31/23 0748 03/31/23 0756  BP:    Pulse:  (!) 112  Resp:  (!) 30  Temp:    SpO2: 97% 98%     Intake/Output Summary (Last 24 hours) at 03/31/2023 2956 Last data filed at 03/31/2023 0000 Gross per 24 hour  Intake 224.91 ml  Output --  Net 224.91 ml    GENERAL:alert, no distress and comfortable.  She has oxygen delivery via nasal cannula SKIN: skin color, texture, turgor are normal, no rashes or significant lesions EYES: normal, Conjunctiva are pink and non-injected, sclera clear OROPHARYNX:no exudate, no erythema and lips, buccal mucosa, and tongue normal  NECK: supple, thyroid normal size, non-tender, without nodularity LYMPH:  no palpable lymphadenopathy in the cervical, axillary or inguinal LUNGS: Poor breath sounds on the right HEART: Tachycardia, no lower extremity edema ABDOMEN:abdomen soft, non-tender and normal bowel sounds Musculoskeletal:no cyanosis of  digits and no clubbing  NEURO: alert & oriented x 3 with fluent speech, no focal motor/sensory deficits   Labs:  Recent Labs    02/01/23 0923 03/28/23 1245 03/29/23 0454 03/30/23 0744 03/31/23 0417  NA 141 137 137 139 136  K 3.9 3.5 3.2* 3.1* 4.0  CL 106 103 101 103 104  CO2 26 22 25 26 27   GLUCOSE 101* 98  115* 106* 109*  BUN 13 7 6 7 6   CREATININE 0.68 0.67 0.66 0.55 0.57  CALCIUM 9.4 8.0* 8.1* 8.1* 8.2*  GFRNONAA >60 >60 >60 >60 >60  PROT 6.8 5.4* 5.5*  --   --   ALBUMIN 4.1 2.5* 2.9*  --   --   AST 13* 15 13*  --   --   ALT 8 11 9   --   --   ALKPHOS 84 62 55  --   --   BILITOT 0.4 0.8 1.1  --   --     Studies: I have reviewed her chest x-ray DG Chest Port 1 View  Result Date: 03/30/2023 CLINICAL DATA:  Shortness of breath. EXAM: PORTABLE CHEST 1 VIEW COMPARISON:  03/28/2023 FINDINGS: Stable right IJ power port. Persistent large right pleural effusion and significant overlying atelectasis. Stable metastatic pulmonary nodules. IMPRESSION: Persistent large right pleural effusion and significant overlying atelectasis. Stable pulmonary metastatic disease. Electronically Signed   By: Rudie Meyer M.D.   On: 03/30/2023 16:18   ECHOCARDIOGRAM COMPLETE  Result Date: 03/29/2023    ECHOCARDIOGRAM REPORT   Patient Name:   Tammie Gilmore Date of Exam: 03/29/2023 Medical Rec #:  [id number]         Height:       68.0 in Accession #:    [id number]        Weight:       125.4 lb Date of Birth:  1965-11-23        BSA:          1.676 m Patient Age:    56 years          BP:           162/90 mmHg Patient Gender: F                 HR:           106 bpm. Exam Location:  Inpatient Procedure: 2D Echo, Cardiac Doppler, Color Doppler and Strain Analysis Indications:    Pulmonary embolus  History:        Patient has prior history of Echocardiogram examinations, most                 recent 11/05/2022. Cancer, Signs/Symptoms:Dyspnea; Risk                 Factors:pulmonary embolism.  Sonographer:    Wallie Char Referring Phys: [id number] Tammie Gilmore  Sonographer Comments: Image acquisition challenging due to respiratory motion. Global longitudinal strain was attempted. IMPRESSIONS  1. Left ventricular ejection fraction, by estimation, is 65 to 70%. The left ventricle has normal function. The left ventricle has no  regional wall motion abnormalities. Left ventricular diastolic parameters were normal.  2. Right ventricular systolic function is normal. The right ventricular size is normal. There is normal pulmonary artery systolic pressure. The estimated right ventricular systolic pressure is 27.4 mmHg.  3. The mitral valve is myxomatous. No evidence of mitral valve regurgitation. No evidence of mitral stenosis. There is mild late systolic prolapse of multiple segments of the  anterior leaflet of the mitral valve.  4. The aortic valve is tricuspid. Aortic valve regurgitation is mild. No aortic stenosis is present.  5. The inferior vena cava is normal in size with greater than 50% respiratory variability, suggesting right atrial pressure of 3 mmHg. Comparison(s): Prior images reviewed side by side. The left ventricular function has improved. The right ventricular systolic function has improved. FINDINGS  Left Ventricle: Left ventricular ejection fraction, by estimation, is 65 to 70%. The left ventricle has normal function. The left ventricle has no regional wall motion abnormalities. Global longitudinal strain performed but not reported based on interpreter judgement due to suboptimal tracking. The left ventricular internal cavity size was normal in size. There is no left ventricular hypertrophy. Left ventricular diastolic parameters were normal. Right Ventricle: The right ventricular size is normal. No increase in right ventricular wall thickness. Right ventricular systolic function is normal. There is normal pulmonary artery systolic pressure. The tricuspid regurgitant velocity is 2.47 m/s, and  with an assumed right atrial pressure of 3 mmHg, the estimated right ventricular systolic pressure is 27.4 mmHg. Left Atrium: Left atrial size was normal in size. Right Atrium: Right atrial size was normal in size. Pericardium: There is no evidence of pericardial effusion. Mitral Valve: The mitral valve is myxomatous. There is mild late  systolic prolapse of multiple segments of the anterior leaflet of the mitral valve. No evidence of mitral valve regurgitation. No evidence of mitral valve stenosis. MV peak gradient, 4.7 mmHg. The mean mitral valve gradient is 2.0 mmHg. Tricuspid Valve: The tricuspid valve is normal in structure. Tricuspid valve regurgitation is mild . No evidence of tricuspid stenosis. Aortic Valve: The aortic valve is tricuspid. Aortic valve regurgitation is mild. No aortic stenosis is present. Aortic valve mean gradient measures 4.5 mmHg. Aortic valve peak gradient measures 8.8 mmHg. Aortic valve area, by VTI measures 1.86 cm. Pulmonic Valve: The pulmonic valve was normal in structure. Pulmonic valve regurgitation is not visualized. No evidence of pulmonic stenosis. Aorta: The aortic root is normal in size and structure. Venous: The inferior vena cava is normal in size with greater than 50% respiratory variability, suggesting right atrial pressure of 3 mmHg. IAS/Shunts: No atrial level shunt detected by color flow Doppler.  LEFT VENTRICLE PLAX 2D LVIDd:         3.60 cm     Diastology LVIDs:         2.70 cm     LV e' medial:    8.59 cm/s LV PW:         1.10 cm     LV E/e' medial:  10.0 LV IVS:        0.80 cm     LV e' lateral:   10.40 cm/s LVOT diam:     1.80 cm     LV E/e' lateral: 8.3 LV SV:         45 LV SV Index:   27 LVOT Area:     2.54 cm  LV Volumes (MOD) LV vol d, MOD A2C: 56.9 ml LV vol d, MOD A4C: 70.0 ml LV vol s, MOD A2C: 17.5 ml LV vol s, MOD A4C: 20.8 ml LV SV MOD A2C:     39.4 ml LV SV MOD A4C:     70.0 ml LV SV MOD BP:      45.1 ml RIGHT VENTRICLE             IVC RV Basal diam:  4.00 cm  IVC diam: 1.10 cm RV S prime:     19.30 cm/s TAPSE (M-mode): 2.2 cm LEFT ATRIUM             Index        RIGHT ATRIUM           Index LA diam:        2.30 cm 1.37 cm/m   RA Area:     11.70 cm LA Vol (A2C):   22.6 ml 13.48 ml/m  RA Volume:   30.70 ml  18.32 ml/m LA Vol (A4C):   26.3 ml 15.69 ml/m LA Biplane Vol: 24.6 ml  14.68 ml/m  AORTIC VALVE AV Area (Vmax):    1.91 cm AV Area (Vmean):   1.85 cm AV Area (VTI):     1.86 cm AV Vmax:           148.50 cm/s AV Vmean:          98.200 cm/s AV VTI:            0.242 m AV Peak Grad:      8.8 mmHg AV Mean Grad:      4.5 mmHg LVOT Vmax:         111.45 cm/s LVOT Vmean:        71.350 cm/s LVOT VTI:          0.177 m LVOT/AV VTI ratio: 0.73  AORTA Ao Root diam: 3.10 cm Ao Asc diam:  3.40 cm MITRAL VALVE               TRICUSPID VALVE MV Area (PHT): 2.56 cm    TR Peak grad:   24.4 mmHg MV Area VTI:   2.14 cm    TR Vmax:        247.00 cm/s MV Peak grad:  4.7 mmHg MV Mean grad:  2.0 mmHg    SHUNTS MV Vmax:       1.08 m/s    Systemic VTI:  0.18 m MV Vmean:      65.9 cm/s   Systemic Diam: 1.80 cm MV Decel Time: 296 msec MV E velocity: 86.30 cm/s MV A velocity: 92.50 cm/s MV E/A ratio:  0.93 Mihai Croitoru MD Electronically signed by Thurmon Fair MD Signature Date/Time: 03/29/2023/9:56:43 AM    Final    DG CHEST PORT 1 VIEW  Result Date: 03/28/2023 CLINICAL DATA:  Increased shortness of breath EXAM: PORTABLE CHEST 1 VIEW COMPARISON:  03/28/2023, CT 02/11/2023 FINDINGS: Right-sided central venous port tip at the cavoatrial region. Bilateral pulmonary nodules corresponding to history of metastatic disease. At least moderate sized right pleural effusion which may be redistributed. Diffuse ground-glass opacity in the right thorax potentially due to edema. No convincing pneumothorax. Persistent consolidation at the right base. IMPRESSION: 1. Bilateral pulmonary nodules corresponding to history of metastatic disease. 2. At least moderate sized right pleural effusion which may be redistributed or slightly increased since exam earlier today. Diffuse ground-glass opacity in the right thorax appears worsened and may be due to asymmetric edema. Electronically Signed   By: Jasmine Pang M.D.   On: 03/28/2023 21:37   VAS [place] LOWER EXTREMITY VENOUS (DVT)  Result Date: 03/28/2023  Lower Venous DVT Study  Patient Name:  AUDERY MARX  Date of Exam:   03/28/2023 Medical Rec #: [id number]          Accession #:    [id number] Date of Birth: 1966-06-01         Patient Gender: F Patient Age:  56 years Exam Location:  [facility] Procedure:      VAS [place] LOWER EXTREMITY VENOUS (DVT) Referring Phys: Tammie Gilmore --------------------------------------------------------------------------------  Indications: Pulmonary embolism.  Risk Factors: Confirmed PE Cancer. Anticoagulation: Heparin. Comparison Study: No prior studies. Performing Technologist: Chanda Busing RVT  Examination Guidelines: A complete evaluation includes B-mode imaging, spectral Doppler, color Doppler, and power Doppler as needed of all accessible portions of each vessel. Bilateral testing is considered an integral part of a complete examination. Limited examinations for reoccurring indications may be performed as noted. The reflux portion of the exam is performed with the patient in reverse Trendelenburg.  +---------+---------------+---------+-----------+----------+--------------+ RIGHT    CompressibilityPhasicitySpontaneityPropertiesThrombus Aging +---------+---------------+---------+-----------+----------+--------------+ CFV      Full           Yes      Yes                                 +---------+---------------+---------+-----------+----------+--------------+ SFJ      Full                                                        +---------+---------------+---------+-----------+----------+--------------+ FV Prox  Full                                                        +---------+---------------+---------+-----------+----------+--------------+ FV Mid   Full                                                        +---------+---------------+---------+-----------+----------+--------------+ FV DistalFull                                                         +---------+---------------+---------+-----------+----------+--------------+ PFV      Full                                                        +---------+---------------+---------+-----------+----------+--------------+ POP      Full           Yes      Yes                                 +---------+---------------+---------+-----------+----------+--------------+ PTV      Full                                                        +---------+---------------+---------+-----------+----------+--------------+ PERO  Full                                                        +---------+---------------+---------+-----------+----------+--------------+   +---------+---------------+---------+-----------+----------+--------------+ LEFT     CompressibilityPhasicitySpontaneityPropertiesThrombus Aging +---------+---------------+---------+-----------+----------+--------------+ CFV      Full           Yes      Yes                                 +---------+---------------+---------+-----------+----------+--------------+ SFJ      Full                                                        +---------+---------------+---------+-----------+----------+--------------+ FV Prox  Full                                                        +---------+---------------+---------+-----------+----------+--------------+ FV Mid   Full                                                        +---------+---------------+---------+-----------+----------+--------------+ FV DistalFull                                                        +---------+---------------+---------+-----------+----------+--------------+ PFV      Full                                                        +---------+---------------+---------+-----------+----------+--------------+ POP      Full           Yes      Yes                                  +---------+---------------+---------+-----------+----------+--------------+ PTV      Full                                                        +---------+---------------+---------+-----------+----------+--------------+ PERO     Full                                                        +---------+---------------+---------+-----------+----------+--------------+  Summary: RIGHT: - There is no evidence of deep vein thrombosis in the lower extremity.  - No cystic structure found in the popliteal fossa.  LEFT: - There is no evidence of deep vein thrombosis in the lower extremity.  - No cystic structure found in the popliteal fossa.  *See table(s) above for measurements and observations. Electronically signed by Sherald Hess MD on 03/28/2023 at 5:36:09 PM.    Final    US THORACENTESIS ASP PLEURAL SPACE W/IMG GUIDE  Result Date: 03/28/2023 INDICATION: Patient with history of dyspnea, pulmonary embolus, bilateral pleural effusions right greater than left. Request received for diagnostic and therapeutic right thoracentesis. EXAM: ULTRASOUND GUIDED DIAGNOSTIC AND THERAPEUTIC RIGHT THORACENTESIS MEDICATIONS: 8 mL of 1% lidocaine COMPLICATIONS: None immediate. PROCEDURE: An ultrasound guided thoracentesis was thoroughly discussed with the patient and questions answered. The benefits, risks, alternatives and complications were also discussed. The patient understands and wishes to proceed with the procedure. Written consent was obtained. Ultrasound was performed to localize and mark an adequate pocket of fluid in the right chest. The area was then prepped and draped in the normal sterile fashion. 1% Lidocaine was used for local anesthesia. Under ultrasound guidance a 6 Fr Safe-T-Centesis catheter was introduced. Thoracentesis was performed. The catheter was removed and a dressing applied. FINDINGS: A total of approximately 1.6 liters of clear, yellow fluid was removed. Samples were sent to the  laboratory as requested by the clinical team. IMPRESSION: Successful ultrasound guided diagnostic and therapeutic right thoracentesis yielding 1.6 liters of pleural fluid. Performed by: Artemio Aly Electronically Signed   By: Simonne Come M.D.   On: 03/28/2023 17:05   DG Chest Port 1 View  Result Date: 03/28/2023 CLINICAL DATA:  Post right-sided thoracentesis. EXAM: PORTABLE CHEST 1 VIEW COMPARISON:  03/27/2023; chest CT-02/11/2023 FINDINGS: Interval reduction in persistent moderate sized partially loculated right-sided pleural effusion post thoracentesis. Improved aeration of the right lung with residual right mid and lower lung atelectasis/collapse. Improved visualization of known a proximally 1.8 cm right upper lung pulmonary nodule. No pneumothorax. Similar appearance of the left lung including proximally 1.8 cm left lower lung pulmonary nodule. No evidence of edema. Grossly unchanged cardiac silhouette and mediastinal contours. Stable positioning of support apparatus. No acute osseous abnormalities. IMPRESSION: 1. Interval reduction in persistent moderate sized partially loculated right-sided pleural effusion post thoracentesis. No pneumothorax. 2. Improved aeration of the right lung with residual right mid and lower lung atelectasis/collapse. 3. Redemonstrated known bilateral pulmonary metastases. Electronically Signed   By: Simonne Come M.D.   On: 03/28/2023 16:59   DG Outside Films Chest  Result Date: 03/28/2023 This examination belongs to an outside facility and is stored here for comparison purposes only.  Contact the originating outside institution for any associated report or interpretation.  CT OUTSIDE FILMS CHEST  Result Date: 03/28/2023 This examination belongs to an outside facility and is stored here for comparison purposes only.  Contact the originating outside institution for any associated report or interpretation.

## 2023-03-31 NOTE — Progress Notes (Signed)
Patient wearing oxygen set at 4lpm with sp02=99%. Patient has no c/o shortness of breath. Bipap in the room on standby.

## 2023-03-31 NOTE — Progress Notes (Signed)
ANTICOAGULATION CONSULT NOTE  Pharmacy Consult for Apixaban (transitioning from IV heparin) Indication: pulmonary embolus  Allergies  Allergen Reactions   Doxycycline Nausea Only and Other (See Comments)    Dizziness    Patient Measurements: Height: 5\' 8"  (172.7 cm) Weight: 56.9 kg (125 lb 7.1 oz) IBW/kg (Calculated) : 63.9 Heparin Dosing Weight: 56.9 kg  Vital Signs: Temp: 98.2 F (36.8 C) (06/20 0412) Temp Source: Oral (06/20 0412) BP: 134/82 (06/20 0700) Pulse Rate: 101 (06/20 0700)  Labs: Recent Labs    03/29/23 0454 03/29/23 1408 03/30/23 0124 03/30/23 0208 03/30/23 0744 03/30/23 0745 03/31/23 0417  HGB 11.0*  --   --  10.6*  --   --  11.6*  HCT 34.5*  --   --  33.5*  --   --  36.7  PLT 238  --   --  258  --   --  263  HEPARINUNFRC 0.21*   < > 0.42  --   --  0.46 0.53  CREATININE 0.66  --   --   --  0.55  --  0.57   < > = values in this interval not displayed.     Estimated Creatinine Clearance: 70.5 mL/min (by C-G formula based on SCr of 0.57 mg/dL).   Medical History: Past Medical History:  Diagnosis Date   History of radiation therapy    Lumbar Spine- 03/10/22-03/23/22- Dr. Antony Blackbird   History of radiation therapy    SBRT to iliac nodes; 09/14/22-09/24/22 Dr. 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 Since then she reports SOB and reports that she was told that was a side effect of the new treatment. Imaging at outside hospital positive for pulmonary artery embolus in the left upper and lower lobes with no evidence of right heart strain. She was initially started on heparin - per report from transport, on heparin infusion at  10.6 ml/hr. On 6/17, Pharmacy consulted to manage heparin infusion. Today, 6/20, Pharmacy consulted to transition to apixaban.  Per med history, no anticoagulation noted PTA.  03/31/2023: Heparin level 0.53 -   therapeutic on IV heparin 1250 units/hr.    CBC: Hgb up 11.6, pltc WNL   No bleeding or infusion related concerns reported by RN  Goal of Therapy:  Treat PE Heparin level 0.3-0.7 units/ml Monitor platelets by anticoagulation protocol: Yes   Plan:  Discontinue IV heparin at time when apixaban started Apixaban 10 mg twice daily for 7 days followed by 5 mg twice daily With no dose adjustments anticipated will sign off consult and continue to monitor CBC as ordered by provider and signs/symptoms of bleeding   Thank you for allowing pharmacy to be a part of this patient's care.  Selinda Eon, PharmD, BCPS Clinical Pharmacist [place] Please utilize Amion for appropriate phone number to reach the unit pharmacist [facility]) 03/31/2023 7:53 AM

## 2023-04-01 ENCOUNTER — Inpatient Hospital Stay (HOSPITAL_COMMUNITY): Payer: BC Managed Care – PPO

## 2023-04-01 DIAGNOSIS — C7800 Secondary malignant neoplasm of unspecified lung: Secondary | ICD-10-CM | POA: Diagnosis not present

## 2023-04-01 DIAGNOSIS — C55 Malignant neoplasm of uterus, part unspecified: Secondary | ICD-10-CM | POA: Diagnosis not present

## 2023-04-01 DIAGNOSIS — I2609 Other pulmonary embolism with acute cor pulmonale: Secondary | ICD-10-CM | POA: Diagnosis not present

## 2023-04-01 DIAGNOSIS — J910 Malignant pleural effusion: Secondary | ICD-10-CM | POA: Diagnosis not present

## 2023-04-01 DIAGNOSIS — Z7189 Other specified counseling: Secondary | ICD-10-CM

## 2023-04-01 LAB — BASIC METABOLIC PANEL
Anion gap: 11 (ref 5–15)
BUN: 11 mg/dL (ref 6–20)
CO2: 30 mmol/L (ref 22–32)
Calcium: 8.3 mg/dL — ABNORMAL LOW (ref 8.9–10.3)
Chloride: 95 mmol/L — ABNORMAL LOW (ref 98–111)
Creatinine, Ser: 0.45 mg/dL (ref 0.44–1.00)
GFR, Estimated: >60 mL/min (ref >60)
Glucose, Bld: 129 mg/dL — ABNORMAL HIGH (ref 70–99)
Potassium: 3.1 mmol/L — ABNORMAL LOW (ref 3.5–5.1)
Sodium: 136 mmol/L (ref 135–145)

## 2023-04-01 LAB — CBC
HCT: 40.7 % (ref 36.0–46.0)
Hemoglobin: 12.8 g/dL (ref 12.0–15.0)
MCH: 29.6 pg (ref 26.0–34.0)
MCHC: 31.4 g/dL (ref 30.0–36.0)
MCV: 94.2 fL (ref 80.0–100.0)
Platelets: 310 10*3/uL (ref 150–400)
RBC: 4.32 MIL/uL (ref 3.87–5.11)
RDW: 12.8 % (ref 11.5–15.5)
WBC: 11.5 10*3/uL — ABNORMAL HIGH (ref 4.0–10.5)
nRBC: 0 % (ref 0.0–0.2)

## 2023-04-01 LAB — MAGNESIUM: Magnesium: 1.8 mg/dL (ref 1.7–2.4)

## 2023-04-01 MED ORDER — POTASSIUM CHLORIDE CRYS ER 20 MEQ PO TBCR
40.0000 meq | EXTENDED_RELEASE_TABLET | ORAL | Status: DC
  Filled 2023-04-01: qty 2

## 2023-04-01 MED ORDER — KATE FARMS STANDARD 1.4 PO LIQD
325.0000 mL | Freq: Two times a day (BID) | ORAL | Status: DC
  Filled 2023-04-01: qty 325

## 2023-04-01 MED ORDER — POTASSIUM CHLORIDE 20 MEQ PO PACK
40.0000 meq | PACK | ORAL | Status: AC
  Administered 2023-04-01 (×2): 40 meq via ORAL
  Filled 2023-04-01 (×2): qty 2

## 2023-04-01 NOTE — Progress Notes (Signed)
Daily Progress Note   Patient Name: Tammie Gilmore       Date: 04/01/2023 DOB: 04-16-1966  Age: 57 y.o. MRN#: [id number] Attending Physician: Dimple Nanas, MD Primary Care Physician: Juliette Alcide, MD Admit Date: 03/28/2023 Length of Stay: 4 days  Reason for Consultation/Follow-up: Establishing goals of care  Subjective:   CC: Patient feels breathing improved today after thoracentesis yesterday. Following up regarding complex medical decision making.   Subjective:  Discussed care with bedside RN for updates. EMR review note thoracentesis yielded 1.3L of hazy yellow fluid drained with out complications.   When presenting to bedside, patient sitting up in chair. Patient's husband present and patient provided permission to continue conversation. Again introduced myself as a member of the palliative medicine team.  Patient noted feeling breathing improved after procedure yesterday. Currently no other symptom concerns. Patient's primary concern is following up with oncologist, Dr. Meredith Mody, at [facility] next week for chemotherapy. Patient notes she has lab work scheduled for Monday, a virtual appointment with Dr. Meredith Mody on Tuesday, and plan to receive chemotherapy on Wednesday. Noted would follow up with hospitalist regarding these appointments as patient's goals for medical care remain focused on continuing chemotherapy with Dr. Meredith Mody. All questions answered at that time. Noted palliative medicine team would continue to follow along as needed.   Review of Systems Breathing improved Objective:   Vital Signs:  BP 95/73   Pulse 98   Temp 98.7 F (37.1 C) (Oral)   Resp (!) 21   Ht 5\' 8"  (1.727 m)   Wt 56.9 kg   SpO2 100%   BMI 19.07 kg/m   Physical Exam: General: NAD, alert, sitting in chair at bedside, cachetic, frail  Eyes: no drainage noted HENT: moist mucous membranes Cardiovascular: tachycardia noted Respiratory: slightly  increased work of breathing noted Abdomen: not  distended Extremities: muscle wasting preset in all extremities  Neuro: A&Ox4, following commands easily Psych: appropriately answers all questions  Imaging:  I personally reviewed recent imaging.   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  Since admission, imaging has shown small PE and enlarging chronic right malignant pleural effusion.  Underwent thoracentesis on 6/17 for management.  Patient followed by [facility] oncology as well as local oncologist, Dr. Bertis Ruddy.  Palliative medicine team consulted to assist with complex medical decision making.  Recommendations/Plan:   # Complex medical decision making/goals of care                -Patient has discussed her cancer directed therapies with her local oncologist, Dr. Bertis Ruddy, and her [facility] oncologist Dr. Meredith Mody.  Patient hopeful to continue follow up appointments with Dr. Meredith Mody for chemotherapy which are already scheduled for next week.                 -  Code Status: Full Code    # Symptom management                -Already provided education on use of opioids if needed for dyspnea management.    # Psycho-social/Spiritual Support:  - Support System: husband, friend  - Desire for further Chaplain support: already educated patient about chaplain support being available if needed   # Discharge Planning:  Home   Discussed with:  patient, patient's husband, hosptialsit, bedside RN  Thank you for allowing the palliative care team to participate in the care [facility] [employer].  Alvester Morin, DO Palliative Care Provider PMT # [phone]  If patient remains symptomatic despite maximum doses, please call PMT at [phone] between 0700 and 1900. Outside of these hours, please call  attending, as PMT does not have night coverage.  This provider spent a total of 27 minutes providing patient's care.  Includes review of EMR, discussing care with other staff members involved in patient's medical care, obtaining relevant history and information from patient and/or patient's family, and personal review of imaging and lab work. Greater than 50% of the time was spent counseling and coordinating care related to the above assessment and plan.    *Please note that this is a verbal dictation therefore any spelling or grammatical errors are due to the "[employer]agon [employer] One" system interpretation.

## 2023-04-01 NOTE — Progress Notes (Signed)
PROGRESS NOTE    Tammie Gilmore  WJX:[id number] DOB: 09-19-1966 DOA: 03/28/2023 PCP: Juliette Alcide, MD   Brief Narrative:  57 year old with history of metastatic leiomyosarcoma to brain with pathologic fracture follows Dr. Bertis Ruddy, peripheral neuropathy from chemotherapy, osteopenia, hemorrhoids, anxiety transferred from [facility] for progressive weakness over the past 3 weeks.  Patient was found to have a small left-sided large right-sided pleural effusion.  Transferred here for further management.  During the hospitalization patient had thoracentesis which improved her symptoms but still persist as this is likely malignant effusion.  Pulmonary and oncology team consulted.  Repeat thoracentesis on the right side removed 1.3 L.   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  With repeat thoracentesis 6/20, removed 1.3 L from the right side. Repeat chest x-ray Previous provider discussed with IR regarding pulmonary embolism but it is notably small Lower extremity Dopplers-negative for DVT Echocardiogram-EF 65% without any evidence of strain. As needed nebs.  I-S/flutter valve.  Out of bed to chair   Presumed malignant pleural effusion  Uterine leiomyosarcoma (HCC) 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[facility]) -Unfortunately this is malignant effusion and my fear is that this will continue  to recur.  She is already had 2 right-sided thoracentesis with rapid reaccumulation of fluid Right-sided thoracentesis 6/17-1.6 L removed Right-sided thoracentesis 6/20-1.3 L removed  Continue Lasix 40 mg IV twice daily   Hypokalemia - As needed repletion   Questionable cystitis Finished 3 days of Rocephin   Normocytic anemia, likely of chronic disease Stable, due to chemotherapy   Protein-calorie malnutrition, severe (HCC) Appreciate nutrition recommendations to ensure appropriate caloric intake *Of note patient states intolerance to OTC protein drinks(diarrhea)  Appreciate input from palliative care  PT/OT-pending   DVT prophylaxis: Heparin drip> Eliquis Code Status: Full Family Communication: Husband at bedside Status is: Inpatient Ongoing management for recurrent effusion on IV diuretics     Diet Orders (From admission, onward)     Start     Ordered   03/29/23 0842  Diet regular Room service appropriate? Yes; Fluid consistency: Thin  Diet effective now       Question Answer Comment  Room service appropriate? Yes   Fluid consistency: Thin      03/29/23 0842            Subjective: Doing ok ths morning Worries about fluid reaccumulation.   Examination: Constitutional: Not in acute distress Respiratory: b/l rhonchi.  Cardiovascular: Normal sinus rhythm, no rubs Abdomen: Nontender nondistended good bowel sounds Musculoskeletal: No edema noted Skin: No rashes seen Neurologic: CN 2-12 grossly intact.  And nonfocal Psychiatric: Normal judgment and insight. Alert and oriented x 3. Normal mood.   Objective: Vitals:   04/01/23 0300 04/01/23 0400 04/01/23 0500 04/01/23 0600  BP: 119/85 131/74 113/78 95/73  Pulse: (!) 106 94 (!) 116 98  Resp: (!) 30 (!) 23 (!) 28 (!) 21  Temp: 98.7 F (37.1 C)     TempSrc: Oral  SpO2: 98% 100% 99% 100%  Weight:      Height:        Intake/Output Summary (Last 24 hours) at 04/01/2023 0737 Last data filed at  03/31/2023 2100 Gross per 24 hour  Intake 114.48 ml  Output --  Net 114.48 ml   Filed Weights   03/28/23 1145  Weight: 56.9 kg    Scheduled Meds:  alteplase  2 mg Intracatheter Once   apixaban  10 mg Oral BID   Followed by   Melene Muller ON 04/07/2023] apixaban  5 mg Oral BID   Chlorhexidine Gluconate Cloth  6 each Topical Daily   feeding supplement (KATE FARMS STANDARD 1.4)  325 mL Oral BID BM   furosemide  40 mg Intravenous BID   metoprolol succinate  25 mg Oral QHS   pantoprazole  40 mg Oral Daily   potassium chloride  40 mEq Oral Q4H   sodium chloride flush  10-40 mL Intracatheter Q12H   Continuous Infusions:  sodium chloride Stopped (03/29/23 1806)    Nutritional status Signs/Symptoms: mild fat depletion, severe muscle depletion Interventions: Refer to RD note for recommendations Body mass index is 19.07 kg/m.  Data Reviewed:   CBC: Recent Labs  Lab 03/28/23 1315 03/29/23 0454 03/30/23 0208 03/31/23 0417 04/01/23 0506  WBC 8.4 10.5 8.1 8.5 11.5*  HGB 9.5* 11.0* 10.6* 11.6* 12.8  HCT 30.7* 34.5* 33.5* 36.7 40.7  MCV 97.5 95.6 95.7 95.6 94.2  PLT 218 238 258 263 310   Basic Metabolic Panel: Recent Labs  Lab 03/28/23 1245 03/29/23 0454 03/30/23 0744 03/31/23 0417 04/01/23 0506  NA 137 137 139 136 136  K 3.5 3.2* 3.1* 4.0 3.1*  CL 103 101 103 104 95*  CO2 22 25 26 27 30   GLUCOSE 98 115* 106* 109* 129*  BUN 7 6 7 6 11   CREATININE 0.67 0.66 0.55 0.57 0.45  CALCIUM 8.0* 8.1* 8.1* 8.2* 8.3*  MG 1.7  --   --  1.8 1.8  PHOS 3.7  --  3.5  --   --    GFR: Estimated Creatinine Clearance: 70.5 mL/min (by C-G formula based on SCr of 0.45 mg/dL). Liver Function Tests: Recent Labs  Lab 03/28/23 1245 03/29/23 0454  AST 15 13*  ALT 11 9  ALKPHOS 62 55  BILITOT 0.8 1.1  PROT 5.4* 5.5*  ALBUMIN 2.5* 2.9*   No results for input(s): "LIPASE", "AMYLASE" in the last 168 hours. No results for input(s): "AMMONIA" in the last 168 hours. Coagulation Profile: No  results for input(s): "INR", "PROTIME" in the last 168 hours. Cardiac Enzymes: No results for input(s): "CKTOTAL", "CKMB", "CKMBINDEX", "TROPONINI" in the last 168 hours. BNP (last 3 results) No results for input(s): "PROBNP" in the last 8760 hours. HbA1C: No results for input(s): "HGBA1C" in the last 72 hours. CBG: No results for input(s): "GLUCAP" in the last 168 hours. Lipid Profile: No results for input(s): "CHOL", "HDL", "LDLCALC", "TRIG", "CHOLHDL", "LDLDIRECT" in the last 72 hours. Thyroid Function Tests: No results for input(s): "TSH", "T4TOTAL", "FREET4", "T3FREE", "THYROIDAB" in the last 72 hours. Anemia Panel: No results for input(s): "VITAMINB12", "FOLATE", "FERRITIN", "TIBC", "IRON", "RETICCTPCT" in the last 72 hours. Sepsis Labs: Recent Labs  Lab 03/30/23 0744  PROCALCITON 0.32    Recent Results (from the past 240 hour(s))  MRSA Next Gen by PCR, Nasal     Status: None   Collection Time: 03/30/23  4:08 PM   Specimen: Nasal Mucosa; Nasal Swab  Result Value Ref Range Status  MRSA by PCR Next Gen NOT DETECTED NOT DETECTED Final    Comment: (NOTE) The GeneXpert MRSA Assay (FDA approved for NASAL specimens only), is one component of a comprehensive MRSA colonization surveillance program. It is not intended to diagnose MRSA infection nor to guide or monitor treatment for MRSA infections. Test performance is not FDA approved in patients less than 59 years old. Performed at [facility], [address], [place], [place] [address]          Radiology Studies: US THORACENTESIS ASP PLEURAL SPACE W/IMG GUIDE  Result Date: 03/31/2023 INDICATION: 57 year old female with shortness of breath, previous CXR showed right pleural effusion. Request for therapeutic and diagnostic thoracentesis. EXAM: ULTRASOUND GUIDED RIGHT THORACENTESIS MEDICATIONS: 5 mL 1% lidocaine COMPLICATIONS: None immediate. PROCEDURE: An ultrasound guided thoracentesis was thoroughly  discussed with the patient and questions answered. The benefits, risks, alternatives and complications were also discussed. The patient understands and wishes to proceed with the procedure. Written consent was obtained. Ultrasound was performed to localize and mark an adequate pocket of fluid in the right chest. The area was then prepped and draped in the normal sterile fashion. 1% Lidocaine was used for local anesthesia. Under ultrasound guidance a 6 Fr Safe-T-Centesis catheter was introduced. Thoracentesis was performed. The catheter was removed and a dressing applied. FINDINGS: A total of approximately 1.3 L of hazy yellow fluid was removed. Samples were sent to the laboratory as requested by the clinical team. Post procedure chest X-ray reviewed, negative for pneumothorax. IMPRESSION: Successful ultrasound guided right thoracentesis yielding 1.3 L of pleural fluid. Performed by: Lawernce Ion, PA-C Electronically Signed   By: Simonne Come M.D.   On: 03/31/2023 15:12   DG CHEST PORT 1 VIEW  Result Date: 03/31/2023 CLINICAL DATA:  Pleural effusion.  Status post right thoracentesis. EXAM: PORTABLE CHEST 1 VIEW COMPARISON:  Chest x-ray from yesterday. FINDINGS: Unchanged right chest wall port catheter. Significantly decreased now trace right pleural effusion with markedly improved aeration of the right lung. Residual mild right basilar atelectasis. Unchanged bilateral pulmonary nodules. No pneumothorax. The heart size and mediastinal contours are within normal limits. No acute osseous abnormality. IMPRESSION: 1. Significantly decreased now trace right pleural effusion with markedly improved aeration of the right lung. No pneumothorax. Electronically Signed   By: Obie Dredge M.D.   On: 03/31/2023 13:54   DG Chest Port 1 View  Result Date: 03/30/2023 CLINICAL DATA:  Shortness of breath. EXAM: PORTABLE CHEST 1 VIEW COMPARISON:  03/28/2023 FINDINGS: Stable right IJ power port. Persistent large right pleural  effusion and significant overlying atelectasis. Stable metastatic pulmonary nodules. IMPRESSION: Persistent large right pleural effusion and significant overlying atelectasis. Stable pulmonary metastatic disease. Electronically Signed   By: Rudie Meyer M.D.   On: 03/30/2023 16:18           LOS: 4 days   Time spent= 35 mins    Karyme Mcconathy Joline Maxcy, MD [facility]  If 7PM-7AM, please contact night-coverage  04/01/2023, 7:37 AM

## 2023-04-01 NOTE — Progress Notes (Signed)
Tammie Gilmore   DOB:09-07-1966   ZO#:[id number]    ASSESSMENT & PLAN:  Metastatic leiomyosarcoma She is receiving treatment at [facility], due cycle 2 on 6/17, delayed due to admission Patient is interested to receive more treatment Continue supportive care   Recurrent right pleural effusion Likely malignant I recommend intermittent thoracentesis for now rather than Pleurx since patient has just started on new chemo 3 weeks ago and could respond favorably to treatment However, if she changes her mind, then Pleurx would be appropriate if she is transitioned to palliative care/hospice She had repeat thoracentesis yesterday with improvement   Recent changes in cardiac function, questionable fluid overload Her ECHO at [facility] suggest reduce EF just 10 days ago but repeat ECHo yesterday showed normal EF She felt better after recent thoracentesis and diuresis Recommend continue medical management and weaning off oxygen as tolerated   Pulmonary embolism Due to her active cancer. Recommend transition to NOAC if pulmonary team agree not to place Pleurx   Protein calorie malnutrition Possibly contributing to 3rd spacing She has very poor oral intake I recommend high-protein supplemental drinks as tolerated  Significant baseline tachycardia Cause unknown but could be related to slight dehydration from poor intake and catabolic state I cannot see how she can be discharged with baseline abnormal vitals with HR over 120 at rest in bed I encourage increase oral intake    Goals of care Overall she wants to "try" to receive more chemo "if she is strong enough" Overall, I think the patient has very poor understanding of her disease process and overall prognosis.  She was optimistic because of multiple treatments that were offered by her oncologist at [facility].  I will continue to address goals of care every day with her.  I felt that the patient has unrealistic expectation.   Discharge planning It is unlikely  she will be discharged this weekend given her overall decline and abnormal vitals  All questions were answered. The patient knows to call the clinic with any problems, questions or concerns.   The total time spent in the appointment was 55 minutes encounter with patients including review of chart and various tests results, discussions about plan of care and coordination of care plan  Artis Delay, MD 04/01/2023 4:11 PM  Subjective:  She is seen in the ICU, best friend by bedside Reported poor oral intake, skipping meals Her breathing is better after thoracentesis  Objective:  Vitals:   04/01/23 1300 04/01/23 1400  BP: 128/79 127/84  Pulse: (!) 121 (!) 124  Resp: (!) 30 (!) 26  Temp:    SpO2: 99% 98%     Intake/Output Summary (Last 24 hours) at 04/01/2023 1611 Last data filed at 03/31/2023 2100 Gross per 24 hour  Intake 10 ml  Output --  Net 10 ml    GENERAL:alert, no distress and comfortable NEURO: alert & oriented x 3 with fluent speech, no focal motor/sensory deficits   Labs:  Recent Labs    02/01/23 0923 03/28/23 1245 03/29/23 0454 03/30/23 0744 03/31/23 0417 04/01/23 0506  NA 141 137 137 139 136 136  K 3.9 3.5 3.2* 3.1* 4.0 3.1*  CL 106 103 101 103 104 95*  CO2 26 22 25 26 27 30   GLUCOSE 101* 98 115* 106* 109* 129*  BUN 13 7 6 7 6 11   CREATININE 0.68 0.67 0.66 0.55 0.57 0.45  CALCIUM 9.4 8.0* 8.1* 8.1* 8.2* 8.3*  GFRNONAA >60 >60 >60 >60 >60 >60  PROT 6.8 5.4* 5.5*  --   --   --  ALBUMIN 4.1 2.5* 2.9*  --   --   --   AST 13* 15 13*  --   --   --   ALT 8 11 9   --   --   --   ALKPHOS 84 62 55  --   --   --   BILITOT 0.4 0.8 1.1  --   --   --     Studies:  DG Chest Port 1 View  Result Date: 04/01/2023 CLINICAL DATA:  Dyspnea EXAM: PORTABLE CHEST 1 VIEW COMPARISON:  03/31/2023 FINDINGS: Cardiac shadow is stable. Right chest wall port is again seen. Stable parenchymal nodules are noted on the left similar to that seen on prior CT examination. Right-sided  pleural effusion is noted which has increased slightly from the prior plain film examination. No other focal abnormality is noted. IMPRESSION: Changes consistent with pulmonary metastatic disease. Right-sided effusion is noted slightly greater than that seen on the prior exam. Electronically Signed   By: Alcide Clever M.D.   On: 04/01/2023 10:06   US THORACENTESIS ASP PLEURAL SPACE W/IMG GUIDE  Result Date: 03/31/2023 INDICATION: 57 year old female with shortness of breath, previous CXR showed right pleural effusion. Request for therapeutic and diagnostic thoracentesis. EXAM: ULTRASOUND GUIDED RIGHT THORACENTESIS MEDICATIONS: 5 mL 1% lidocaine COMPLICATIONS: None immediate. PROCEDURE: An ultrasound guided thoracentesis was thoroughly discussed with the patient and questions answered. The benefits, risks, alternatives and complications were also discussed. The patient understands and wishes to proceed with the procedure. Written consent was obtained. Ultrasound was performed to localize and mark an adequate pocket of fluid in the right chest. The area was then prepped and draped in the normal sterile fashion. 1% Lidocaine was used for local anesthesia. Under ultrasound guidance a 6 Fr Safe-T-Centesis catheter was introduced. Thoracentesis was performed. The catheter was removed and a dressing applied. FINDINGS: A total of approximately 1.3 L of hazy yellow fluid was removed. Samples were sent to the laboratory as requested by the clinical team. Post procedure chest X-ray reviewed, negative for pneumothorax. IMPRESSION: Successful ultrasound guided right thoracentesis yielding 1.3 L of pleural fluid. Performed by: Lawernce Ion, PA-C Electronically Signed   By: Simonne Come M.D.   On: 03/31/2023 15:12   DG CHEST PORT 1 VIEW  Result Date: 03/31/2023 CLINICAL DATA:  Pleural effusion.  Status post right thoracentesis. EXAM: PORTABLE CHEST 1 VIEW COMPARISON:  Chest x-ray from yesterday. FINDINGS: Unchanged right chest  wall port catheter. Significantly decreased now trace right pleural effusion with markedly improved aeration of the right lung. Residual mild right basilar atelectasis. Unchanged bilateral pulmonary nodules. No pneumothorax. The heart size and mediastinal contours are within normal limits. No acute osseous abnormality. IMPRESSION: 1. Significantly decreased now trace right pleural effusion with markedly improved aeration of the right lung. No pneumothorax. Electronically Signed   By: Obie Dredge M.D.   On: 03/31/2023 13:54   DG Chest Port 1 View  Result Date: 03/30/2023 CLINICAL DATA:  Shortness of breath. EXAM: PORTABLE CHEST 1 VIEW COMPARISON:  03/28/2023 FINDINGS: Stable right IJ power port. Persistent large right pleural effusion and significant overlying atelectasis. Stable metastatic pulmonary nodules. IMPRESSION: Persistent large right pleural effusion and significant overlying atelectasis. Stable pulmonary metastatic disease. Electronically Signed   By: Rudie Meyer M.D.   On: 03/30/2023 16:18   ECHOCARDIOGRAM COMPLETE  Result Date: 03/29/2023    ECHOCARDIOGRAM REPORT   Patient Name:   Tammie Gilmore Date of Exam: 03/29/2023 Medical Rec #:  [id number]  Height:       68.0 in Accession #:    [id number]        Weight:       125.4 lb Date of Birth:  04/02/1966        BSA:          1.676 m Patient Age:    56 years          BP:           162/90 mmHg Patient Gender: F                 HR:           106 bpm. Exam Location:  Inpatient Procedure: 2D Echo, Cardiac Doppler, Color Doppler and Strain Analysis Indications:    Pulmonary embolus  History:        Patient has prior history of Echocardiogram examinations, most                 recent 11/05/2022. Cancer, Signs/Symptoms:Dyspnea; Risk                 Factors:pulmonary embolism.  Sonographer:    Wallie Char Referring Phys: [id number] DAVID MANUEL ORTIZ  Sonographer Comments: Image acquisition challenging due to respiratory motion. Global  longitudinal strain was attempted. IMPRESSIONS  1. Left ventricular ejection fraction, by estimation, is 65 to 70%. The left ventricle has normal function. The left ventricle has no regional wall motion abnormalities. Left ventricular diastolic parameters were normal.  2. Right ventricular systolic function is normal. The right ventricular size is normal. There is normal pulmonary artery systolic pressure. The estimated right ventricular systolic pressure is 27.4 mmHg.  3. The mitral valve is myxomatous. No evidence of mitral valve regurgitation. No evidence of mitral stenosis. 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  4. The aortic valve is tricuspid. Aortic valve regurgitation is mild. No aortic stenosis is present.  5. The inferior vena cava is normal in size with greater than 50% respiratory variability, suggesting right atrial pressure of 3 mmHg. Comparison(s): Prior images reviewed side by side. The left ventricular function has improved. The right ventricular systolic function has improved. FINDINGS  Left Ventricle: Left ventricular ejection fraction, by estimation, is 65 to 70%. The left ventricle has normal function. The left ventricle has no regional wall motion abnormalities. Global longitudinal strain performed but not reported based on interpreter judgement due to suboptimal tracking. The left ventricular internal cavity size was normal in size. There is no left ventricular hypertrophy. Left ventricular diastolic parameters were normal. Right Ventricle: The right ventricular size is normal. No increase in right ventricular wall thickness. Right ventricular systolic function is normal. There is normal pulmonary artery systolic pressure. The tricuspid regurgitant velocity is 2.47 m/s, and  with an assumed right atrial pressure of 3 mmHg, the estimated right ventricular systolic pressure is 27.4 mmHg. Left Atrium: Left atrial size was normal in  size. Right Atrium: Right atrial size was normal in size. Pericardium: There is no evidence of pericardial effusion. 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 No evidence of mitral valve regurgitation. No evidence of mitral valve stenosis. MV peak gradient, 4.7 mmHg. The mean mitral valve gradient is 2.0 mmHg. Tricuspid Valve: The tricuspid valve is normal in structure. Tricuspid valve regurgitation is mild . No evidence of tricuspid stenosis. Aortic Valve: The  aortic valve is tricuspid. Aortic valve regurgitation is mild. No aortic stenosis is present. Aortic valve mean gradient measures 4.5 mmHg. Aortic valve peak gradient measures 8.8 mmHg. Aortic valve area, by VTI measures 1.86 cm. Pulmonic Valve: The pulmonic valve was normal in structure. Pulmonic valve regurgitation is not visualized. No evidence of pulmonic stenosis. Aorta: The aortic root is normal in size and structure. Venous: The inferior vena cava is normal in size with greater than 50% respiratory variability, suggesting right atrial pressure of 3 mmHg. IAS/Shunts: No atrial level shunt detected by color flow Doppler.  LEFT VENTRICLE PLAX 2D LVIDd:         3.60 cm     Diastology LVIDs:         2.70 cm     LV e' medial:    8.59 cm/s LV PW:         1.10 cm     LV E/e' medial:  10.0 LV IVS:        0.80 cm     LV e' lateral:   10.40 cm/s LVOT diam:     1.80 cm     LV E/e' lateral: 8.3 LV SV:         45 LV SV Index:   27 LVOT Area:     2.54 cm  LV Volumes (MOD) LV vol d, MOD A2C: 56.9 ml LV vol d, MOD A4C: 70.0 ml LV vol s, MOD A2C: 17.5 ml LV vol s, MOD A4C: 20.8 ml LV SV MOD A2C:     39.4 ml LV SV MOD A4C:     70.0 ml LV SV MOD BP:      45.1 ml RIGHT VENTRICLE             IVC RV Basal diam:  4.00 cm     IVC diam: 1.10 cm RV S prime:     19.30 cm/s TAPSE (M-mode): 2.2 cm LEFT ATRIUM             Index        RIGHT ATRIUM           Index LA diam:         2.30 cm 1.37 cm/m   RA Area:     11.70 cm LA Vol (A2C):   22.6 ml 13.48 ml/m  RA Volume:   30.70 ml  18.32 ml/m LA Vol (A4C):   26.3 ml 15.69 ml/m LA Biplane Vol: 24.6 ml 14.68 ml/m  AORTIC VALVE AV Area (Vmax):    1.91 cm AV Area (Vmean):   1.85 cm AV Area (VTI):     1.86 cm AV Vmax:           148.50 cm/s AV Vmean:          98.200 cm/s AV VTI:            0.242 m AV Peak Grad:      8.8 mmHg AV Mean Grad:      4.5 mmHg LVOT Vmax:         111.45 cm/s LVOT Vmean:        71.350 cm/s LVOT VTI:          0.177 m LVOT/AV VTI ratio: 0.73  AORTA Ao Root diam: 3.10 cm Ao Asc diam:  3.40 cm MITRAL VALVE               TRICUSPID VALVE MV Area (PHT): 2.56 cm    TR Peak grad:   24.4 mmHg  MV Area VTI:   2.14 cm    TR Vmax:        247.00 cm/s MV Peak grad:  4.7 mmHg MV Mean grad:  2.0 mmHg    SHUNTS MV Vmax:       1.08 m/s    Systemic VTI:  0.18 m MV Vmean:      65.9 cm/s   Systemic Diam: 1.80 cm MV Decel Time: 296 msec MV E velocity: 86.30 cm/s MV A velocity: 92.50 cm/s MV E/A ratio:  0.93 Mihai Croitoru MD Electronically signed by Thurmon Fair MD Signature Date/Time: 03/29/2023/9:56:43 AM    Final    DG CHEST PORT 1 VIEW  Result Date: 03/28/2023 CLINICAL DATA:  Increased shortness of breath EXAM: PORTABLE CHEST 1 VIEW COMPARISON:  03/28/2023, CT 02/11/2023 FINDINGS: Right-sided central venous port tip at the cavoatrial region. Bilateral pulmonary nodules corresponding to history of metastatic disease. At least moderate sized right pleural effusion which may be redistributed. Diffuse ground-glass opacity in the right thorax potentially due to edema. No convincing pneumothorax. Persistent consolidation at the right base. IMPRESSION: 1. Bilateral pulmonary nodules corresponding to history of metastatic disease. 2. At least moderate sized right pleural effusion which may be redistributed or slightly increased since exam earlier today. Diffuse ground-glass opacity in the right thorax appears worsened and may be due to  asymmetric edema. Electronically Signed   By: Jasmine Pang M.D.   On: 03/28/2023 21:37   VAS [place] LOWER EXTREMITY VENOUS (DVT)  Result Date: 03/28/2023  Lower Venous DVT Study Patient Name:  MALEENA EDDLEMAN  Date of Exam:   03/28/2023 Medical Rec #: [id number]          Accession #:    [id number] Date of Birth: 06-09-1966         Patient Gender: F Patient Age:   59 years Exam Location:  [facility] Procedure:      VAS [place] LOWER EXTREMITY VENOUS (DVT) Referring Phys: DAVID ORTIZ --------------------------------------------------------------------------------  Indications: Pulmonary embolism.  Risk Factors: Confirmed PE Cancer. Anticoagulation: Heparin. Comparison Study: No prior studies. Performing Technologist: Chanda Busing RVT  Examination Guidelines: A complete evaluation includes B-mode imaging, spectral Doppler, color Doppler, and power Doppler as needed of all accessible portions of each vessel. Bilateral testing is considered an integral part of a complete examination. Limited examinations for reoccurring indications may be performed as noted. The reflux portion of the exam is performed with the patient in reverse Trendelenburg.  +---------+---------------+---------+-----------+----------+--------------+ RIGHT    CompressibilityPhasicitySpontaneityPropertiesThrombus Aging +---------+---------------+---------+-----------+----------+--------------+ CFV      Full           Yes      Yes                                 +---------+---------------+---------+-----------+----------+--------------+ SFJ      Full                                                        +---------+---------------+---------+-----------+----------+--------------+ FV Prox  Full                                                        +---------+---------------+---------+-----------+----------+--------------+  FV Mid   Full                                                         +---------+---------------+---------+-----------+----------+--------------+ FV DistalFull                                                        +---------+---------------+---------+-----------+----------+--------------+ PFV      Full                                                        +---------+---------------+---------+-----------+----------+--------------+ POP      Full           Yes      Yes                                 +---------+---------------+---------+-----------+----------+--------------+ PTV      Full                                                        +---------+---------------+---------+-----------+----------+--------------+ PERO     Full                                                        +---------+---------------+---------+-----------+----------+--------------+   +---------+---------------+---------+-----------+----------+--------------+ LEFT     CompressibilityPhasicitySpontaneityPropertiesThrombus Aging +---------+---------------+---------+-----------+----------+--------------+ CFV      Full           Yes      Yes                                 +---------+---------------+---------+-----------+----------+--------------+ SFJ      Full                                                        +---------+---------------+---------+-----------+----------+--------------+ FV Prox  Full                                                        +---------+---------------+---------+-----------+----------+--------------+ FV Mid   Full                                                        +---------+---------------+---------+-----------+----------+--------------+  FV DistalFull                                                        +---------+---------------+---------+-----------+----------+--------------+ PFV      Full                                                         +---------+---------------+---------+-----------+----------+--------------+ POP      Full           Yes      Yes                                 +---------+---------------+---------+-----------+----------+--------------+ PTV      Full                                                        +---------+---------------+---------+-----------+----------+--------------+ PERO     Full                                                        +---------+---------------+---------+-----------+----------+--------------+     Summary: RIGHT: - There is no evidence of deep vein thrombosis in the lower extremity.  - No cystic structure found in the popliteal fossa.  LEFT: - There is no evidence of deep vein thrombosis in the lower extremity.  - No cystic structure found in the popliteal fossa.  *See table(s) above for measurements and observations. Electronically signed by Sherald Hess MD on 03/28/2023 at 5:36:09 PM.    Final    US THORACENTESIS ASP PLEURAL SPACE W/IMG GUIDE  Result Date: 03/28/2023 INDICATION: Patient with history of dyspnea, pulmonary embolus, bilateral pleural effusions right greater than left. Request received for diagnostic and therapeutic right thoracentesis. EXAM: ULTRASOUND GUIDED DIAGNOSTIC AND THERAPEUTIC RIGHT THORACENTESIS MEDICATIONS: 8 mL of 1% lidocaine COMPLICATIONS: None immediate. PROCEDURE: An ultrasound guided thoracentesis was thoroughly discussed with the patient and questions answered. The benefits, risks, alternatives and complications were also discussed. The patient understands and wishes to proceed with the procedure. Written consent was obtained. Ultrasound was performed to localize and mark an adequate pocket of fluid in the right chest. The area was then prepped and draped in the normal sterile fashion. 1% Lidocaine was used for local anesthesia. Under ultrasound guidance a 6 Fr Safe-T-Centesis catheter was introduced. Thoracentesis was performed. The catheter  was removed and a dressing applied. FINDINGS: A total of approximately 1.6 liters of clear, yellow fluid was removed. Samples were sent to the laboratory as requested by the clinical team. IMPRESSION: Successful ultrasound guided diagnostic and therapeutic right thoracentesis yielding 1.6 liters of pleural fluid. Performed by: Artemio Aly Electronically Signed   By: Simonne Come M.D.   On: 03/28/2023 17:05   DG Chest Port 1 View  Result Date: 03/28/2023 CLINICAL DATA:  Post right-sided thoracentesis. EXAM: PORTABLE CHEST 1 VIEW COMPARISON:  03/27/2023; chest CT-02/11/2023 FINDINGS: Interval reduction in persistent moderate sized partially loculated right-sided pleural effusion post thoracentesis. Improved aeration of the right lung with residual right mid and lower lung atelectasis/collapse. Improved visualization of known a proximally 1.8 cm right upper lung pulmonary nodule. No pneumothorax. Similar appearance of the left lung including proximally 1.8 cm left lower lung pulmonary nodule. No evidence of edema. Grossly unchanged cardiac silhouette and mediastinal contours. Stable positioning of support apparatus. No acute osseous abnormalities. IMPRESSION: 1. Interval reduction in persistent moderate sized partially loculated right-sided pleural effusion post thoracentesis. No pneumothorax. 2. Improved aeration of the right lung with residual right mid and lower lung atelectasis/collapse. 3. Redemonstrated known bilateral pulmonary metastases. Electronically Signed   By: Simonne Come M.D.   On: 03/28/2023 16:59   DG Outside Films Chest  Result Date: 03/28/2023 This examination belongs to an outside facility and is stored here for comparison purposes only.  Contact the originating outside institution for any associated report or interpretation.  CT OUTSIDE FILMS CHEST  Result Date: 03/28/2023 This examination belongs to an outside facility and is stored here for comparison purposes only.  Contact the  originating outside institution for any associated report or interpretation.

## 2023-04-01 NOTE — Evaluation (Signed)
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  Patient was found to have a small left-sided large right-sided pleural effusion.  Transferred to [facility] for further management.  During the hospitalization patient had thoracentesis which improved her symptoms but still persist as this is likely malignant effusion. Repeat thoracentesis on the right side removed 1.3 L. Pt admitted with Acute hypoxic respiratory failure, multifactorial with concurrent acute symptomatic pulmonary embolism. Concurrent pleural effusion, questionably malignant.   Clinical Impression   Patient is currently requiring assistance with ADLs including min to moderate assist with Lower body ADLs, up to minimal assist with Upper body ADLs,  as well as  Min assist with bed mobility and Min guard assist with functional transfers to toilet.  Current level of function is below patient's typical baseline, but spouse describes a slow declined in past few weeks or months.   During this evaluation, patient was limited by generalized weakness, impaired activity tolerance, and decreased mood with flat affect and need of encouragement to mobilize out of bed to chair for breakfast, all of which has the potential to impact patient's safety and independence during functional mobility, as well as performance for ADLs.  Patient lives at home, with her spouse who is working with family on providing as much supervision and assistance as possible.  Patient demonstrates fair rehab potential, and should benefit from continued skilled occupational therapy services while in acute care to  maximize safety, independence and quality of life at home.  Continued occupational therapy services are recommended.  ?     Recommendations for follow up therapy are one component of a multi-disciplinary discharge planning process, led by the attending physician.  Recommendations may be updated based on patient status, additional functional criteria and insurance authorization.   Assistance Recommended at Discharge Intermittent Supervision/Assistance  Patient can return home with the following A little help with bathing/dressing/bathroom;A little help with walking and/or transfers;Assist for transportation;Assistance with cooking/housework;Help with stairs or ramp for entrance    Functional Status Assessment  Patient has had a recent decline in their functional status and demonstrates the ability to make significant improvements in function in a reasonable and predictable amount of time.  Equipment Recommendations  BSC/3in1;Tub/shower seat    Recommendations for Other Services       Precautions / Restrictions Precautions Precautions: Fall      Mobility Bed Mobility Overal bed mobility: Needs Assistance Bed Mobility: Supine to Sit     Supine to sit: Min assist     General bed mobility comments: Required encouragement for out of bed    Transfers                          Balance Overall balance assessment: Needs assistance Sitting-balance support: Feet supported Sitting balance-Leahy Scale: Good     Standing balance support: No upper extremity supported Standing balance-Leahy Scale: Fair                             ADL either performed or assessed with clinical judgement   ADL Overall ADL's : Needs assistance/impaired Eating/Feeding: Independent;Sitting   Grooming: Wash/dry hands;Set up;Sitting  Upper Body Bathing: Minimal assistance;Sitting Upper Body Bathing Details (indicate cue type and reason): Due to fatigue Lower Body Bathing:  Moderate assistance;Sit to/from stand   Upper Body Dressing : Minimal assistance;Sitting   Lower Body Dressing: Moderate assistance;Sit to/from stand;Sitting/lateral leans   Toilet Transfer: Stand-pivot;BSC/3in1;Min guard;Cueing for safety;Cueing for sequencing Toilet Transfer Details (indicate cue type and reason): Pt stood from EOB with Min guard, no AD used. Cues for hands and pt pivoted to [facility]. After void, pt stood from [facility] with [employer] and took 4-5 lateral step to recliner. Min guard for steps and descent to chair. Toileting- Clothing Manipulation and Hygiene: Set up;Min guard;Sitting/lateral lean Toileting - Clothing Manipulation Details (indicate cue type and reason): Required assistance with clothing. Pt performed anterior peri care while seated on commode.     Functional mobility during ADLs: Min guard;Minimal assistance;Cueing for safety       Vision   Vision Assessment?: No apparent visual deficits     Perception     Praxis      Pertinent Vitals/Pain Pain Assessment Pain Assessment: No/denies pain     Hand Dominance Right   Extremity/Trunk Assessment Upper Extremity Assessment Upper Extremity Assessment: Generalized weakness;RUE deficits/detail;LUE deficits/detail RUE Sensation: history of peripheral neuropathy LUE Sensation: history of peripheral neuropathy   Lower Extremity Assessment Lower Extremity Assessment: Defer to PT evaluation       Communication Communication Communication: No difficulties   Cognition Arousal/Alertness: Awake/alert Behavior During Therapy: Flat affect Overall Cognitive Status: Within Functional Limits for tasks assessed                                 General Comments: Ox4     General Comments       Exercises     Shoulder Instructions      Home Living Family/patient expects to be discharged to:: Private residence Living Arrangements: Spouse/significant other;Children (Adult son.) Available Help at  Discharge: Family (Family is working on supervision and assistance. Spouse has worked from home for 3 years but looking for a new position which would involve leaving the home.) Type of Home: House Home Access: Stairs to enter [employer] of Steps: 4 Entrance Stairs-Rails: None Home Layout: Multi-level;Able to live on main level with bedroom/bathroom;Full bath on main level;Bed/bath upstairs Alternate Level Stairs-Number of Steps: 6, landing then 6 more. Alternate Level Stairs-Rails: Left (Rail first 6 steps then bannister next 6 steps, both on LT) Bathroom Shower/Tub: Walk-in shower (WIS upstairs. Tub/shower on main level.)   Bathroom Toilet: Standard     Home Equipment: None          Prior Functioning/Environment Prior Level of Function : Driving;Needs assist       Physical Assist : ADLs (physical)   ADLs (physical): IADLs Mobility Comments: Independent until about 2 weeks prior to admission. Starting about 2 weeks ago, spouse would help with steps and light ADL help. ADLs Comments: Spouse has done majority of IADLs for 2-3 months. Last two weeks, pt has not engaged in IADLs.        OT Problem List: Decreased strength;Decreased activity tolerance;Decreased knowledge of use of DME or AE;Cardiopulmonary status limiting activity;Impaired sensation      OT Treatment/Interventions:      OT Goals(Current goals can be found in the care plan section) Acute Rehab OT Goals Patient Stated Goal: Increased energy and strenght OT Goal Formulation: With patient/family Time For Goal Achievement: 04/15/23 Potential to Achieve Goals:  Fair ADL Goals Pt Will Perform Grooming: (P) standing;with modified independence (Tolerating at least 2/3 grooming tasks with VSS.) Pt Will Perform Lower Body Dressing: (P) with modified independence;with adaptive equipment;sitting/lateral leans;sit to/from stand Pt Will Transfer to Toilet: (P) with modified independence;ambulating Additional ADL  Goal #1: (P) Pt will actively participate in a gentle BUE home exercise program such as Tai Chi within pain-free ranges, in an unsupported seated position, tolerating at least 5 min x 2 with rests, in order to improve upper body strength, endurance and core stability needed to complete home ADLs. Additional ADL Goal #2: (P) Patient will identify at least 3 energy conservation strategies to employ at home in order to maximize function and quality of life and decrease caregiver burden while preventing exacerbation of symptoms and rehospitalization.  OT Frequency:      Co-evaluation              AM-PAC OT "6 Clicks" Daily Activity     Outcome Measure Help from another person eating meals?: None Help from another person taking care of personal grooming?: A Little Help from another person toileting, which includes using toliet, bedpan, or urinal?: A Little Help from another person bathing (including washing, rinsing, drying)?: A Lot Help from another person to put on and taking off regular upper body clothing?: A Little Help from another person to put on and taking off regular lower body clothing?: A Lot 6 Click Score: 17   End of Session Equipment Utilized During Treatment: Gait belt;Oxygen Nurse Communication: Other (comment) (RN, Cami aware of OT seeing pt.)  Activity Tolerance: Patient tolerated treatment well Patient left: in chair;with call bell/phone within reach;with family/visitor present  OT Visit Diagnosis: Muscle weakness (generalized) (M62.81);Unsteadiness on feet (R26.81)                Time: 0933-1000 OT Time Calculation (min): 27 min Charges:  OT General Charges $OT Visit: 1 Visit OT Evaluation $OT Eval Low Complexity: 1 Low OT Treatments $Self Care/Home Management : 8-22 mins Victorino Dike, OT Acute Rehab Services Office: [phone] 04/01/2023  Theodoro Clock 04/01/2023, 12:30 PM

## 2023-04-01 NOTE — Evaluation (Signed)
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  Patient was found to have a small left-sided large right-sided pleural effusion.  Transferred to [facility] for further management.  During the hospitalization patient had thoracentesis which improved her symptoms but still persist as this is likely malignant effusion. Repeat thoracentesis on the right side removed 1.3 L. Pt admitted with Acute hypoxic respiratory failure, multifactorial with concurrent acute symptomatic pulmonary embolism. Concurrent pleural effusion, questionably malignant.  Clinical Impression  Pt admitted with above diagnosis.  Pt currently with functional limitations due to the deficits listed below (see PT Problem List). Pt will benefit from acute skilled PT to increase their independence and safety with mobility to allow discharge.  Pt reports feeling nervous about mobilizing however agreeable.  Pt ambulated short distance in hallway on 2L O2 [place].  Pt utilized RW for support/safety but does not typically use assistive device at home. Pt's HR elevated to 135 bpm and RN notified.  Pt assisted back to bed per request end of session.  Pt has flight to get to bedroom at home and encouraged just performing steps once a day upon d/c with assist for safety.  Pt would benefit from rollator to assist with safe mobility and endurance upon d/c.        Recommendations for follow up therapy are one component of a multi-disciplinary discharge planning process, led by the attending physician.  Recommendations may be updated based on patient status, additional functional criteria and  insurance authorization.  Follow Up Recommendations       Assistance Recommended at Discharge PRN  Patient can return home with the following  Assistance with cooking/housework;Assist for transportation;Help with stairs or ramp for entrance    Equipment Recommendations Rollator (4 wheels)  Recommendations for Other Services       Functional Status Assessment Patient has had a recent decline in their functional status and demonstrates the ability to make significant improvements in function in a reasonable and predictable amount of time.     Precautions / Restrictions Precautions Precautions: Fall Precaution Comments: monitor O2      Mobility  Bed Mobility Overal bed mobility: Needs Assistance Bed Mobility: Sit to Supine       Sit to supine: Min assist   General bed mobility comments: requested assist for LEs onto bed    Transfers Overall transfer level: Needs assistance Equipment used: Rolling walker (2 wheels) Transfers: Sit to/from Stand Sit to Stand: Min guard           General transfer comment: cues for hand placement    Ambulation/Gait Ambulation/Gait assistance: Min guard Gait Distance (Feet): 30 Feet Assistive device: Rolling walker (2 wheels) Gait Pattern/deviations: Step-through pattern, Decreased stride length Gait velocity: decr     General Gait Details: cues for safe use of RW, HR elevated to 135 bpm, SpO2 remained 98% on 2L O2 RN (RN notified); pt reports anxiety limiting her mobility today but pleased she was able to ambulate  Stairs            Wheelchair Mobility    Modified Rankin (Stroke Patients Only)       Balance Overall balance assessment: Needs assistance  Standing balance support: No upper extremity supported Standing balance-Leahy Scale: Fair                               Pertinent Vitals/Pain Pain Assessment Pain Assessment: No/denies pain    Home Living Family/patient expects to be  discharged to:: Private residence Living Arrangements: Spouse/significant other;Children (Adult son.) Available Help at Discharge: Family (Family is working on supervision and assistance. Spouse has worked from home for 3 years but looking for a new position which would involve leaving the home.) Type of Home: House Home Access: Stairs to enter Entrance Stairs-Rails: None Entrance Stairs-Number of Steps: 4 Alternate Level Stairs-Number of Steps: 6, landing then 6 more. Home Layout: Multi-level;Able to live on main level with bedroom/bathroom;Full bath on main level;Bed/bath upstairs Home Equipment: None      Prior Function Prior Level of Function : Driving;Needs assist       Physical Assist : ADLs (physical)   ADLs (physical): IADLs Mobility Comments: Independent until about 2 weeks prior to admission. Starting about 2 weeks ago, spouse would help with steps and light ADL help. ADLs Comments: Spouse has done majority of IADLs for 2-3 months. Last two weeks, pt has not engaged in IADLs.     Hand Dominance   Dominant Hand: Right    Extremity/Trunk Assessment   Upper Extremity Assessment Upper Extremity Assessment: Generalized weakness;RUE deficits/detail;LUE deficits/detail RUE Sensation: history of peripheral neuropathy LUE Sensation: history of peripheral neuropathy    Lower Extremity Assessment Lower Extremity Assessment: Generalized weakness       Communication   Communication: No difficulties  Cognition Arousal/Alertness: Awake/alert Behavior During Therapy: Flat affect Overall Cognitive Status: Within Functional Limits for tasks assessed                                          General Comments      Exercises     Assessment/Plan    PT Assessment Patient needs continued PT services  PT Problem List Decreased strength;Decreased activity tolerance;Decreased balance;Decreased mobility;Decreased knowledge of use of DME;Cardiopulmonary status  limiting activity       PT Treatment Interventions Gait training;DME instruction;Therapeutic exercise;Balance training;Functional mobility training;Therapeutic activities;Patient/family education    PT Goals (Current goals can be found in the Care Plan section)  Acute Rehab PT Goals PT Goal Formulation: With patient/family Time For Goal Achievement: 04/15/23 Potential to Achieve Goals: Good    Frequency Min 1X/week     Co-evaluation               AM-PAC PT "6 Clicks" Mobility  Outcome Measure Help needed turning from your back to your side while in a flat bed without using bedrails?: A Little Help needed moving from lying on your back to sitting on the side of a flat bed without using bedrails?: A Little Help needed moving to and from a bed to a chair (including a wheelchair)?: A Little Help needed standing up from a chair using your arms (e.g., wheelchair or bedside chair)?: A Little Help needed to walk in hospital room?: A Little Help needed climbing 3-5 steps with a railing? : A Lot 6 Click Score: 17    End of Session Equipment Utilized During Treatment: Gait belt Activity Tolerance: Patient tolerated treatment well Patient left: with call bell/phone within reach;in bed;with family/visitor present Nurse Communication: Mobility status  PT Visit Diagnosis: Difficulty in walking, not elsewhere classified (R26.2)    Time: 1130-1146 PT Time Calculation (min) (ACUTE ONLY): 16 min   Charges:   PT Evaluation $PT Eval Low Complexity: 1 Low         Kati PT, DPT Physical Therapist Acute Rehabilitation Services Office: [phone]   Janan Halter [place] 04/01/2023, 3:33 PM

## 2023-04-01 NOTE — Progress Notes (Signed)
   04/01/23 2007  BiPAP/CPAP/SIPAP  Reason BIPAP/CPAP not in use Non-compliant (Pt doing well on [place])

## 2023-04-02 ENCOUNTER — Inpatient Hospital Stay (HOSPITAL_COMMUNITY): Payer: BC Managed Care – PPO

## 2023-04-02 DIAGNOSIS — J90 Pleural effusion, not elsewhere classified: Secondary | ICD-10-CM | POA: Diagnosis not present

## 2023-04-02 DIAGNOSIS — J910 Malignant pleural effusion: Secondary | ICD-10-CM | POA: Diagnosis not present

## 2023-04-02 DIAGNOSIS — C55 Malignant neoplasm of uterus, part unspecified: Secondary | ICD-10-CM | POA: Diagnosis not present

## 2023-04-02 DIAGNOSIS — E440 Moderate protein-calorie malnutrition: Secondary | ICD-10-CM | POA: Diagnosis not present

## 2023-04-02 DIAGNOSIS — C7800 Secondary malignant neoplasm of unspecified lung: Secondary | ICD-10-CM | POA: Diagnosis not present

## 2023-04-02 LAB — CBC
HCT: 37.6 % (ref 36.0–46.0)
Hemoglobin: 12.2 g/dL (ref 12.0–15.0)
MCH: 30.3 pg (ref 26.0–34.0)
MCHC: 32.4 g/dL (ref 30.0–36.0)
MCV: 93.3 fL (ref 80.0–100.0)
Platelets: 270 10*3/uL (ref 150–400)
RBC: 4.03 MIL/uL (ref 3.87–5.11)
RDW: 13.1 % (ref 11.5–15.5)
WBC: 9.2 10*3/uL (ref 4.0–10.5)
nRBC: 0 % (ref 0.0–0.2)

## 2023-04-02 LAB — BASIC METABOLIC PANEL
Anion gap: 9 (ref 5–15)
BUN: 17 mg/dL (ref 6–20)
CO2: 31 mmol/L (ref 22–32)
Calcium: 8.5 mg/dL — ABNORMAL LOW (ref 8.9–10.3)
Chloride: 94 mmol/L — ABNORMAL LOW (ref 98–111)
Creatinine, Ser: 0.55 mg/dL (ref 0.44–1.00)
GFR, Estimated: >60 mL/min (ref >60)
Glucose, Bld: 124 mg/dL — ABNORMAL HIGH (ref 70–99)
Potassium: 4.1 mmol/L (ref 3.5–5.1)
Sodium: 134 mmol/L — ABNORMAL LOW (ref 135–145)

## 2023-04-02 LAB — MAGNESIUM: Magnesium: 1.8 mg/dL (ref 1.7–2.4)

## 2023-04-02 MED ORDER — KATE FARMS STANDARD 1.4 PO LIQD
325.0000 mL | Freq: Two times a day (BID) | ORAL | Status: DC
  Administered 2023-04-02 – 2023-04-04 (×6): 325 mL via ORAL
  Filled 2023-04-02 (×6): qty 325

## 2023-04-02 MED ORDER — PROCHLORPERAZINE EDISYLATE 10 MG/2ML IJ SOLN
10.0000 mg | Freq: Once | INTRAMUSCULAR | Status: AC | PRN
  Administered 2023-04-02: 10 mg via INTRAVENOUS
  Filled 2023-04-02: qty 2

## 2023-04-02 NOTE — Progress Notes (Signed)
Jenell Dobransky [place]   DOB:08-09-1966   VH#:[id number]    ASSESSMENT & PLAN:  Metastatic leiomyosarcoma She is receiving treatment at [facility], due cycle 2 on 6/17, delayed due to admission Patient is interested to receive more treatment, but I cautioned the idea of rushing into treatment due to poor baseline performance status and unstable vital signs Continue supportive care   Right pleural effusion Likely malignant I recommend intermittent thoracentesis for now rather than Pleurx since patient has just started on new chemo 3 weeks ago and could respond favorably to treatment However, if she changes her mind, then Pleurx would be appropriate if she is transitioned to palliative care/hospice Her x-ray from yesterday was reviewed Her examination showed poor air entry on the right lung She would likely need thoracentesis again within the next 2 days   Recent changes in cardiac function, questionable fluid overload Her ECHO at [facility] suggest reduce EF just 10 days ago but repeat ECHo yesterday showed normal EF She felt better after recent thoracentesis and diuresis Recommend continue medical management and weaning off oxygen as tolerated   Pulmonary embolism Due to her active cancer. Recommend transition to NOAC if pulmonary team agree not to place Pleurx   Protein calorie malnutrition Possibly contributing to 3rd spacing and recurrent pleural effusion The patient is now in catabolic state She has very poor oral intake I recommend high-protein supplemental drinks as tolerated We discussed importance of frequent small meals and high-protein intake as tolerated   Goals of care Overall she wants to "try" to receive more chemo "if she is strong enough" Overall, I think the patient has very poor understanding of her disease process and overall prognosis.  She was optimistic because of multiple treatments that were offered by her oncologist at [facility].  I will continue to address goals of care every day  with her.  I felt that the patient has unrealistic expectation   Discharge planning It is unlikely she will be discharged this weekend given her overall decline If she remains stable, she can be moved out of stepdown unit  All questions were answered. The patient knows to call the clinic with any problems, questions or concerns.   The total time spent in the appointment was 40 minutes encounter with patients including review of chart and various tests results, discussions about plan of care and coordination of care plan  Artis Delay, MD 04/02/2023 9:40 AM  Subjective:  Her husband is by the bedside.  We discussed progress made overall.  Her shortness of breath is stable.  I am concerned about her nutritional oral intake which remain poor.  Last night, she ate for chicken nuggets.  This morning, she ate a bit of pancakes She remains very concerned about her next dose of chemotherapy despite long discussion about getting her nutritional intake improved.  Her husband commented severe nausea after first dose of chemotherapy precipitating poor oral intake.  I expressed concern related to recent drop in ejection fraction after 1 dose of chemotherapy  Objective:  Vitals:   04/02/23 0430 04/02/23 0800  BP:  111/68  Pulse:  (!) 113  Resp:  17  Temp: 98.3 F (36.8 C) 97.8 F (36.6 C)  SpO2:  98%    No intake or output data in the 24 hours ending 04/02/23 0940  GENERAL:alert, no distress and comfortable. She looks thin and cachectic NEURO: alert & oriented x 3 with fluent speech, no focal motor/sensory deficits   Labs:  Recent Labs  02/01/23 0981 03/28/23 1245 03/29/23 0454 03/30/23 0744 03/31/23 0417 04/01/23 0506 04/02/23 0445  NA 141 137 137   < > 136 136 134*  K 3.9 3.5 3.2*   < > 4.0 3.1* 4.1  CL 106 103 101   < > 104 95* 94*  CO2 26 22 25    < > 27 30 31   GLUCOSE 101* 98 115*   < > 109* 129* 124*  BUN 13 7 6    < > 6 11 17   CREATININE 0.68 0.67 0.66   < > 0.57 0.45 0.55   CALCIUM 9.4 8.0* 8.1*   < > 8.2* 8.3* 8.5*  GFRNONAA >60 >60 >60   < > >60 >60 >60  PROT 6.8 5.4* 5.5*  --   --   --   --   ALBUMIN 4.1 2.5* 2.9*  --   --   --   --   AST 13* 15 13*  --   --   --   --   ALT 8 11 9   --   --   --   --   ALKPHOS 84 62 55  --   --   --   --   BILITOT 0.4 0.8 1.1  --   --   --   --    < > = values in this interval not displayed.    Studies:  DG Chest Port 1 View  Result Date: 04/01/2023 CLINICAL DATA:  Dyspnea EXAM: PORTABLE CHEST 1 VIEW COMPARISON:  03/31/2023 FINDINGS: Cardiac shadow is stable. Right chest wall port is again seen. Stable parenchymal nodules are noted on the left similar to that seen on prior CT examination. Right-sided pleural effusion is noted which has increased slightly from the prior plain film examination. No other focal abnormality is noted. IMPRESSION: Changes consistent with pulmonary metastatic disease. Right-sided effusion is noted slightly greater than that seen on the prior exam. Electronically Signed   By: Alcide Clever M.D.   On: 04/01/2023 10:06   US THORACENTESIS ASP PLEURAL SPACE W/IMG GUIDE  Result Date: 03/31/2023 INDICATION: 57 year old female with shortness of breath, previous CXR showed right pleural effusion. Request for therapeutic and diagnostic thoracentesis. EXAM: ULTRASOUND GUIDED RIGHT THORACENTESIS MEDICATIONS: 5 mL 1% lidocaine COMPLICATIONS: None immediate. PROCEDURE: An ultrasound guided thoracentesis was thoroughly discussed with the patient and questions answered. The benefits, risks, alternatives and complications were also discussed. The patient understands and wishes to proceed with the procedure. Written consent was obtained. Ultrasound was performed to localize and mark an adequate pocket of fluid in the right chest. The area was then prepped and draped in the normal sterile fashion. 1% Lidocaine was used for local anesthesia. Under ultrasound guidance a 6 Fr Safe-T-Centesis catheter was introduced.  Thoracentesis was performed. The catheter was removed and a dressing applied. FINDINGS: A total of approximately 1.3 L of hazy yellow fluid was removed. Samples were sent to the laboratory as requested by the clinical team. Post procedure chest X-ray reviewed, negative for pneumothorax. IMPRESSION: Successful ultrasound guided right thoracentesis yielding 1.3 L of pleural fluid. Performed by: Lawernce Ion, PA-C Electronically Signed   By: Simonne Come M.D.   On: 03/31/2023 15:12   DG CHEST PORT 1 VIEW  Result Date: 03/31/2023 CLINICAL DATA:  Pleural effusion.  Status post right thoracentesis. EXAM: PORTABLE CHEST 1 VIEW COMPARISON:  Chest x-ray from yesterday. FINDINGS: Unchanged right chest wall port catheter. Significantly decreased now trace right pleural effusion with markedly improved aeration of the  right lung. Residual mild right basilar atelectasis. Unchanged bilateral pulmonary nodules. No pneumothorax. The heart size and mediastinal contours are within normal limits. No acute osseous abnormality. IMPRESSION: 1. Significantly decreased now trace right pleural effusion with markedly improved aeration of the right lung. No pneumothorax. Electronically Signed   By: Obie Dredge M.D.   On: 03/31/2023 13:54   DG Chest Port 1 View  Result Date: 03/30/2023 CLINICAL DATA:  Shortness of breath. EXAM: PORTABLE CHEST 1 VIEW COMPARISON:  03/28/2023 FINDINGS: Stable right IJ power port. Persistent large right pleural effusion and significant overlying atelectasis. Stable metastatic pulmonary nodules. IMPRESSION: Persistent large right pleural effusion and significant overlying atelectasis. Stable pulmonary metastatic disease. Electronically Signed   By: Rudie Meyer M.D.   On: 03/30/2023 16:18   ECHOCARDIOGRAM COMPLETE  Result Date: 03/29/2023    ECHOCARDIOGRAM REPORT   Patient Name:   ROYALE LENNARTZ Date of Exam: 03/29/2023 Medical Rec #:  [id number]         Height:       68.0 in Accession #:    [id number]         Weight:       125.4 lb Date of Birth:  15-Jan-1966        BSA:          1.676 m Patient Age:    56 years          BP:           162/90 mmHg Patient Gender: F                 HR:           106 bpm. Exam Location:  Inpatient Procedure: 2D Echo, Cardiac Doppler, Color Doppler and Strain Analysis Indications:    Pulmonary embolus  History:        Patient has prior history of Echocardiogram examinations, most                 recent 11/05/2022. Cancer, Signs/Symptoms:Dyspnea; Risk                 Factors:pulmonary embolism.  Sonographer:    Wallie Char Referring Phys: [id number] DAVID MANUEL ORTIZ  Sonographer Comments: Image acquisition challenging due to respiratory motion. Global longitudinal strain was attempted. IMPRESSIONS  1. Left ventricular ejection fraction, by estimation, is 65 to 70%. The left ventricle has normal function. The left ventricle has no regional wall motion abnormalities. Left ventricular diastolic parameters were normal.  2. Right ventricular systolic function is normal. The right ventricular size is normal. There is normal pulmonary artery systolic pressure. The estimated right ventricular systolic pressure is 27.4 mmHg.  3. The mitral valve is myxomatous. No evidence of mitral valve regurgitation. No evidence of mitral stenosis. 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  4. The aortic valve is tricuspid. Aortic valve regurgitation is mild. No aortic stenosis is present.  5. The inferior vena cava is normal in size with greater than 50% respiratory variability, suggesting right atrial pressure of 3 mmHg. Comparison(s): Prior images reviewed side by side. The left ventricular function has improved. The right ventricular systolic function has improved. FINDINGS  Left Ventricle: Left ventricular ejection fraction, by estimation, is 65 to 70%. The left ventricle has normal function. The left ventricle has no regional wall motion  abnormalities. Global longitudinal strain performed but not reported based on interpreter judgement due to suboptimal tracking. The left ventricular internal  cavity size was normal in size. There is no left ventricular hypertrophy. Left ventricular diastolic parameters were normal. Right Ventricle: The right ventricular size is normal. No increase in right ventricular wall thickness. Right ventricular systolic function is normal. There is normal pulmonary artery systolic pressure. The tricuspid regurgitant velocity is 2.47 m/s, and  with an assumed right atrial pressure of 3 mmHg, the estimated right ventricular systolic pressure is 27.4 mmHg. Left Atrium: Left atrial size was normal in size. Right Atrium: Right atrial size was normal in size. Pericardium: There is no evidence of pericardial effusion. 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 No evidence of mitral valve regurgitation. No evidence of mitral valve stenosis. MV peak gradient, 4.7 mmHg. The mean mitral valve gradient is 2.0 mmHg. Tricuspid Valve: The tricuspid valve is normal in structure. Tricuspid valve regurgitation is mild . No evidence of tricuspid stenosis. Aortic Valve: The aortic valve is tricuspid. Aortic valve regurgitation is mild. No aortic stenosis is present. Aortic valve mean gradient measures 4.5 mmHg. Aortic valve peak gradient measures 8.8 mmHg. Aortic valve area, by VTI measures 1.86 cm. Pulmonic Valve: The pulmonic valve was normal in structure. Pulmonic valve regurgitation is not visualized. No evidence of pulmonic stenosis. Aorta: The aortic root is normal in size and structure. Venous: The inferior vena cava is normal in size with greater than 50% respiratory variability, suggesting right atrial pressure of 3 mmHg. IAS/Shunts: No atrial level shunt detected by color flow Doppler.  LEFT VENTRICLE PLAX 2D LVIDd:         3.60  cm     Diastology LVIDs:         2.70 cm     LV e' medial:    8.59 cm/s LV PW:         1.10 cm     LV E/e' medial:  10.0 LV IVS:        0.80 cm     LV e' lateral:   10.40 cm/s LVOT diam:     1.80 cm     LV E/e' lateral: 8.3 LV SV:         45 LV SV Index:   27 LVOT Area:     2.54 cm  LV Volumes (MOD) LV vol d, MOD A2C: 56.9 ml LV vol d, MOD A4C: 70.0 ml LV vol s, MOD A2C: 17.5 ml LV vol s, MOD A4C: 20.8 ml LV SV MOD A2C:     39.4 ml LV SV MOD A4C:     70.0 ml LV SV MOD BP:      45.1 ml RIGHT VENTRICLE             IVC RV Basal diam:  4.00 cm     IVC diam: 1.10 cm RV S prime:     19.30 cm/s TAPSE (M-mode): 2.2 cm LEFT ATRIUM             Index        RIGHT ATRIUM           Index LA diam:        2.30 cm 1.37 cm/m   RA Area:     11.70 cm LA Vol (A2C):   22.6 ml 13.48 ml/m  RA Volume:   30.70 ml  18.32 ml/m LA Vol (A4C):   26.3 ml 15.69 ml/m LA Biplane Vol: 24.6 ml 14.68 ml/m  AORTIC VALVE AV  Area (Vmax):    1.91 cm AV Area (Vmean):   1.85 cm AV Area (VTI):     1.86 cm AV Vmax:           148.50 cm/s AV Vmean:          98.200 cm/s AV VTI:            0.242 m AV Peak Grad:      8.8 mmHg AV Mean Grad:      4.5 mmHg LVOT Vmax:         111.45 cm/s LVOT Vmean:        71.350 cm/s LVOT VTI:          0.177 m LVOT/AV VTI ratio: 0.73  AORTA Ao Root diam: 3.10 cm Ao Asc diam:  3.40 cm MITRAL VALVE               TRICUSPID VALVE MV Area (PHT): 2.56 cm    TR Peak grad:   24.4 mmHg MV Area VTI:   2.14 cm    TR Vmax:        247.00 cm/s MV Peak grad:  4.7 mmHg MV Mean grad:  2.0 mmHg    SHUNTS MV Vmax:       1.08 m/s    Systemic VTI:  0.18 m MV Vmean:      65.9 cm/s   Systemic Diam: 1.80 cm MV Decel Time: 296 msec MV E velocity: 86.30 cm/s MV A velocity: 92.50 cm/s MV E/A ratio:  0.93 Mihai Croitoru MD Electronically signed by Thurmon Fair MD Signature Date/Time: 03/29/2023/9:56:43 AM    Final    DG CHEST PORT 1 VIEW  Result Date: 03/28/2023 CLINICAL DATA:  Increased shortness of breath EXAM: PORTABLE CHEST 1 VIEW  COMPARISON:  03/28/2023, CT 02/11/2023 FINDINGS: Right-sided central venous port tip at the cavoatrial region. Bilateral pulmonary nodules corresponding to history of metastatic disease. At least moderate sized right pleural effusion which may be redistributed. Diffuse ground-glass opacity in the right thorax potentially due to edema. No convincing pneumothorax. Persistent consolidation at the right base. IMPRESSION: 1. Bilateral pulmonary nodules corresponding to history of metastatic disease. 2. At least moderate sized right pleural effusion which may be redistributed or slightly increased since exam earlier today. Diffuse ground-glass opacity in the right thorax appears worsened and may be due to asymmetric edema. Electronically Signed   By: Jasmine Pang M.D.   On: 03/28/2023 21:37   VAS [place] LOWER EXTREMITY VENOUS (DVT)  Result Date: 03/28/2023  Lower Venous DVT Study Patient Name:  CLISTA RAINFORD  Date of Exam:   03/28/2023 Medical Rec #: [id number]          Accession #:    [id number] Date of Birth: 1966/06/21         Patient Gender: F Patient Age:   89 years Exam Location:  [facility] Procedure:      VAS [place] LOWER EXTREMITY VENOUS (DVT) Referring Phys: DAVID ORTIZ --------------------------------------------------------------------------------  Indications: Pulmonary embolism.  Risk Factors: Confirmed PE Cancer. Anticoagulation: Heparin. Comparison Study: No prior studies. Performing Technologist: Chanda Busing RVT  Examination Guidelines: A complete evaluation includes B-mode imaging, spectral Doppler, color Doppler, and power Doppler as needed of all accessible portions of each vessel. Bilateral testing is considered an integral part of a complete examination. Limited examinations for reoccurring indications may be performed as noted. The reflux portion of the exam is performed with the patient in reverse Trendelenburg.   +---------+---------------+---------+-----------+----------+--------------+ RIGHT  CompressibilityPhasicitySpontaneityPropertiesThrombus Aging +---------+---------------+---------+-----------+----------+--------------+ CFV      Full           Yes      Yes                                 +---------+---------------+---------+-----------+----------+--------------+ SFJ      Full                                                        +---------+---------------+---------+-----------+----------+--------------+ FV Prox  Full                                                        +---------+---------------+---------+-----------+----------+--------------+ FV Mid   Full                                                        +---------+---------------+---------+-----------+----------+--------------+ FV DistalFull                                                        +---------+---------------+---------+-----------+----------+--------------+ PFV      Full                                                        +---------+---------------+---------+-----------+----------+--------------+ POP      Full           Yes      Yes                                 +---------+---------------+---------+-----------+----------+--------------+ PTV      Full                                                        +---------+---------------+---------+-----------+----------+--------------+ PERO     Full                                                        +---------+---------------+---------+-----------+----------+--------------+   +---------+---------------+---------+-----------+----------+--------------+ LEFT     CompressibilityPhasicitySpontaneityPropertiesThrombus Aging +---------+---------------+---------+-----------+----------+--------------+ CFV      Full           Yes      Yes                                  +---------+---------------+---------+-----------+----------+--------------+  SFJ      Full                                                        +---------+---------------+---------+-----------+----------+--------------+ FV Prox  Full                                                        +---------+---------------+---------+-----------+----------+--------------+ FV Mid   Full                                                        +---------+---------------+---------+-----------+----------+--------------+ FV DistalFull                                                        +---------+---------------+---------+-----------+----------+--------------+ PFV      Full                                                        +---------+---------------+---------+-----------+----------+--------------+ POP      Full           Yes      Yes                                 +---------+---------------+---------+-----------+----------+--------------+ PTV      Full                                                        +---------+---------------+---------+-----------+----------+--------------+ PERO     Full                                                        +---------+---------------+---------+-----------+----------+--------------+     Summary: RIGHT: - There is no evidence of deep vein thrombosis in the lower extremity.  - No cystic structure found in the popliteal fossa.  LEFT: - There is no evidence of deep vein thrombosis in the lower extremity.  - No cystic structure found in the popliteal fossa.  *See table(s) above for measurements and observations. Electronically signed by Sherald Hess MD on 03/28/2023 at 5:36:09 PM.    Final    US THORACENTESIS ASP PLEURAL SPACE W/IMG GUIDE  Result Date: 03/28/2023 INDICATION: Patient with history of dyspnea, pulmonary embolus, bilateral pleural effusions right greater than left. Request received for diagnostic and therapeutic  right thoracentesis. EXAM: ULTRASOUND GUIDED DIAGNOSTIC AND THERAPEUTIC RIGHT THORACENTESIS MEDICATIONS: 8 mL of 1% lidocaine COMPLICATIONS: None immediate. PROCEDURE: An ultrasound guided thoracentesis was thoroughly discussed with the patient and questions answered. The benefits, risks, alternatives and complications were also discussed. The patient understands and wishes to proceed with the procedure. Written consent was obtained. Ultrasound was performed to localize and mark an adequate pocket of fluid in the right chest. The area was then prepped and draped in the normal sterile fashion. 1% Lidocaine was used for local anesthesia. Under ultrasound guidance a 6 Fr Safe-T-Centesis catheter was introduced. Thoracentesis was performed. The catheter was removed and a dressing applied. FINDINGS: A total of approximately 1.6 liters of clear, yellow fluid was removed. Samples were sent to the laboratory as requested by the clinical team. IMPRESSION: Successful ultrasound guided diagnostic and therapeutic right thoracentesis yielding 1.6 liters of pleural fluid. Performed by: Artemio Aly Electronically Signed   By: Simonne Come M.D.   On: 03/28/2023 17:05   DG Chest Port 1 View  Result Date: 03/28/2023 CLINICAL DATA:  Post right-sided thoracentesis. EXAM: PORTABLE CHEST 1 VIEW COMPARISON:  03/27/2023; chest CT-02/11/2023 FINDINGS: Interval reduction in persistent moderate sized partially loculated right-sided pleural effusion post thoracentesis. Improved aeration of the right lung with residual right mid and lower lung atelectasis/collapse. Improved visualization of known a proximally 1.8 cm right upper lung pulmonary nodule. No pneumothorax. Similar appearance of the left lung including proximally 1.8 cm left lower lung pulmonary nodule. No evidence of edema. Grossly unchanged cardiac silhouette and mediastinal contours. Stable positioning of support apparatus. No acute osseous abnormalities. IMPRESSION: 1.  Interval reduction in persistent moderate sized partially loculated right-sided pleural effusion post thoracentesis. No pneumothorax. 2. Improved aeration of the right lung with residual right mid and lower lung atelectasis/collapse. 3. Redemonstrated known bilateral pulmonary metastases. Electronically Signed   By: Simonne Come M.D.   On: 03/28/2023 16:59   DG Outside Films Chest  Result Date: 03/28/2023 This examination belongs to an outside facility and is stored here for comparison purposes only.  Contact the originating outside institution for any associated report or interpretation.  CT OUTSIDE FILMS CHEST  Result Date: 03/28/2023 This examination belongs to an outside facility and is stored here for comparison purposes only.  Contact the originating outside institution for any associated report or interpretation.

## 2023-04-02 NOTE — Progress Notes (Signed)
   04/02/23 2010  BiPAP/CPAP/SIPAP  Reason BIPAP/CPAP not in use Non-compliant (Pt doing well at this time. No resp distress noted.)

## 2023-04-02 NOTE — Progress Notes (Signed)
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 Bertis Ruddy, peripheral neuropathy from chemotherapy, osteopenia, hemorrhoids, anxiety transferred from [facility] for progressive weakness over the past 3 weeks.  Patient was found to have a small left-sided large right-sided pleural effusion.  Transferred here for further management.  During the hospitalization patient had thoracentesis which improved her symptoms but still persist as this is likely malignant effusion.  Pulmonary and oncology team consulted.  Repeat thoracentesis on the right side removed 1.3 L.   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  With repeat thoracentesis 6/20, removed 1.3 L from the right side. Will repeat another checks x-ray tomorrow morning, if necessary will plan for another thoracentesis-closer to her discharge. Previous provider discussed with IR regarding pulmonary embolism but it is notably small Lower extremity Dopplers-negative for DVT Echocardiogram-EF 65% without any evidence of strain. As needed nebs.  I-S/flutter valve.  Out of bed to chair   Presumed malignant pleural effusion  Uterine  leiomyosarcoma (HCC) 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[facility]) -Unfortunately this is malignant effusion and my fear is that this will continue to recur.  She is already had 2 right-sided thoracentesis with rapid reaccumulation of fluid.  Repeat chest x-ray tomorrow morning, likely will need another Thora prior to discharge Right-sided thoracentesis 6/17-1.6 L removed Right-sided thoracentesis 6/20-1.3 L removed  Continue Lasix 40 mg IV twice daily  Sinus tachycardia - Secondary to intravascular dehydration, cachexia.  Hypokalemia - As needed repletion   Questionable cystitis Finished 3 days of Rocephin   Normocytic anemia, likely of chronic disease Stable, due to chemotherapy   Protein-calorie malnutrition, severe (HCC) Appreciate nutrition recommendations to ensure appropriate caloric intake *Of note patient states intolerance to OTC protein drinks(diarrhea)  Appreciate input from palliative care  Unfortunately patient remains to have poor prognosis PT OT recommended home health, face-to-face evaluation completed   DVT prophylaxis: Heparin drip> Eliquis Code Status: Full Family Communication: Husband at bedside Status is: Inpatient Ongoing management for recurrent effusion on IV diuretics     Diet Orders (From admission, onward)     Start     Ordered   03/29/23 0842  Diet regular Room service appropriate? Yes; Fluid consistency: Thin  Diet effective now       Question Answer Comment  Room service appropriate? Yes   Fluid consistency: Thin      03/29/23 0842            Subjective: She is tachycardic but denies any shortness of breath.  Overall tells me in terms of breathing she feels a little better.  Examination: Constitutional: Not in acute distress, 4 L nasal cannula Respiratory: Diminished breath sounds at the bases Cardiovascular: Sinus tachycardia normal sinus rhythm, no rubs Abdomen: Nontender nondistended good bowel sounds Musculoskeletal:  No  edema noted Skin: No rashes seen Neurologic: CN 2-12 grossly intact.  And nonfocal Psychiatric: Normal judgment and insight. Alert and oriented x 3. Normal mood.    Objective: Vitals:   04/02/23 0000 04/02/23 0400 04/02/23 0430 04/02/23 0800  BP: 105/83 117/64  111/68  Pulse: (!) 114 (!) 101  (!) 113  Resp: (!) 24 20  17   Temp:   98.3 F (36.8 C) 97.8 F (36.6 C)  TempSrc:   Oral Oral  SpO2: 97% 100%  98%  Weight:      Height:       No intake or output data in the 24 hours ending 04/02/23 1025  Filed Weights   03/28/23 1145  Weight: 56.9 kg    Scheduled Meds:  alteplase  2 mg Intracatheter Once   apixaban  10 mg Oral BID   Followed by   Melene Muller ON 04/07/2023] apixaban  5 mg Oral BID   Chlorhexidine Gluconate Cloth  6 each Topical Daily   feeding supplement (KATE FARMS STANDARD 1.4)  325 mL Oral BID BM   furosemide  40 mg Intravenous BID   metoprolol succinate  25 mg Oral QHS   pantoprazole  40 mg Oral Daily   sodium chloride flush  10-40 mL Intracatheter Q12H   Continuous Infusions:  sodium chloride Stopped (03/29/23 1806)    Nutritional status Signs/Symptoms: mild fat depletion, severe muscle depletion Interventions: Refer to RD note for recommendations Body mass index is 19.07 kg/m.  Data Reviewed:   CBC: Recent Labs  Lab 03/29/23 0454 03/30/23 0208 03/31/23 0417 04/01/23 0506 04/02/23 0445  WBC 10.5 8.1 8.5 11.5* 9.2  HGB 11.0* 10.6* 11.6* 12.8 12.2  HCT 34.5* 33.5* 36.7 40.7 37.6  MCV 95.6 95.7 95.6 94.2 93.3  PLT 238 258 263 310 270   Basic Metabolic Panel: Recent Labs  Lab 03/28/23 1245 03/29/23 0454 03/30/23 0744 03/31/23 0417 04/01/23 0506 04/02/23 0445  NA 137 137 139 136 136 134*  K 3.5 3.2* 3.1* 4.0 3.1* 4.1  CL 103 101 103 104 95* 94*  CO2 22 25 26 27 30 31   GLUCOSE 98 115* 106* 109* 129* 124*  BUN 7 6 7 6 11 17   CREATININE 0.67 0.66 0.55 0.57 0.45 0.55  CALCIUM 8.0* 8.1* 8.1* 8.2* 8.3* 8.5*  MG 1.7  --   --  1.8 1.8 1.8   PHOS 3.7  --  3.5  --   --   --    GFR: Estimated Creatinine Clearance: 70.5 mL/min (by C-G formula based on SCr of 0.55 mg/dL). Liver Function Tests: Recent Labs  Lab 03/28/23 1245 03/29/23 0454  AST 15 13*  ALT 11 9  ALKPHOS 62 55  BILITOT 0.8 1.1  PROT 5.4* 5.5*  ALBUMIN 2.5* 2.9*   No results for input(s): "LIPASE", "AMYLASE" in the last 168 hours. No results for input(s): "AMMONIA" in the last 168 hours. Coagulation Profile: No results for input(s): "INR", "PROTIME" in the last 168 hours. Cardiac Enzymes: No results for input(s): "CKTOTAL", "CKMB", "CKMBINDEX", "TROPONINI" in the last 168 hours. BNP (last 3 results) No results for input(s): "PROBNP" in the last 8760 hours. HbA1C: No results for input(s): "HGBA1C" in the last 72 hours. CBG: No results for input(s): "GLUCAP" in the last 168 hours. Lipid Profile: No results for input(s): "CHOL", "HDL", "LDLCALC", "TRIG", "CHOLHDL", "LDLDIRECT" in the last 72 hours. Thyroid Function Tests: No results for input(s): "TSH", "T4TOTAL", "FREET4", "T3FREE", "THYROIDAB" in the last 72 hours. Anemia Panel: No  results for input(s): "VITAMINB12", "FOLATE", "FERRITIN", "TIBC", "IRON", "RETICCTPCT" in the last 72 hours. Sepsis Labs: Recent Labs  Lab 03/30/23 0744  PROCALCITON 0.32    Recent Results (from the past 240 hour(s))  MRSA Next Gen by PCR, Nasal     Status: None   Collection Time: 03/30/23  4:08 PM   Specimen: Nasal Mucosa; Nasal Swab  Result Value Ref Range Status   MRSA by PCR Next Gen NOT DETECTED NOT DETECTED Final    Comment: (NOTE) The GeneXpert MRSA Assay (FDA approved for NASAL specimens only), is one component of a comprehensive MRSA colonization surveillance program. It is not intended to diagnose MRSA infection nor to guide or monitor treatment for MRSA infections. Test performance is not FDA approved in patients less than 68 years old. Performed at [facility], [address] [place], [place] [address]          Radiology Studies: [facility] Chest Port 1 View  Result Date: 04/01/2023 CLINICAL DATA:  Dyspnea EXAM: PORTABLE CHEST 1 VIEW COMPARISON:  03/31/2023 FINDINGS: Cardiac shadow is stable. Right chest wall port is again seen. Stable parenchymal nodules are noted on the left similar to that seen on prior CT examination. Right-sided pleural effusion is noted which has increased slightly from the prior plain film examination. No other focal abnormality is noted. IMPRESSION: Changes consistent with pulmonary metastatic disease. Right-sided effusion is noted slightly greater than that seen on the prior exam. Electronically Signed   By: Alcide Clever M.D.   On: 04/01/2023 10:06   US THORACENTESIS ASP PLEURAL SPACE W/IMG GUIDE  Result Date: 03/31/2023 INDICATION: 57 year old female with shortness of breath, previous CXR showed right pleural effusion. Request for therapeutic and diagnostic thoracentesis. EXAM: ULTRASOUND GUIDED RIGHT THORACENTESIS MEDICATIONS: 5 mL 1% lidocaine COMPLICATIONS: None immediate. PROCEDURE: An ultrasound guided thoracentesis was thoroughly discussed with the patient and questions answered. The benefits, risks, alternatives and complications were also discussed. The patient understands and wishes to proceed with the procedure. Written consent was obtained. Ultrasound was performed to localize and mark an adequate pocket of fluid in the right chest. The area was then prepped and draped in the normal sterile fashion. 1% Lidocaine was used for local anesthesia. Under ultrasound guidance a 6 Fr Safe-T-Centesis catheter was introduced. Thoracentesis was performed. The catheter was removed and a dressing applied. FINDINGS: A total of approximately 1.3 L of hazy yellow fluid was removed. Samples were sent to the laboratory as requested by the clinical team. Post procedure chest X-ray reviewed, negative for pneumothorax. IMPRESSION: Successful ultrasound guided  right thoracentesis yielding 1.3 L of pleural fluid. Performed by: Lawernce Ion, PA-C Electronically Signed   By: Simonne Come M.D.   On: 03/31/2023 15:12   DG CHEST PORT 1 VIEW  Result Date: 03/31/2023 CLINICAL DATA:  Pleural effusion.  Status post right thoracentesis. EXAM: PORTABLE CHEST 1 VIEW COMPARISON:  Chest x-ray from yesterday. FINDINGS: Unchanged right chest wall port catheter. Significantly decreased now trace right pleural effusion with markedly improved aeration of the right lung. Residual mild right basilar atelectasis. Unchanged bilateral pulmonary nodules. No pneumothorax. The heart size and mediastinal contours are within normal limits. No acute osseous abnormality. IMPRESSION: 1. Significantly decreased now trace right pleural effusion with markedly improved aeration of the right lung. No pneumothorax. Electronically Signed   By: Obie Dredge M.D.   On: 03/31/2023 13:54           LOS: 5 days   Time spent= 35 mins  Aalijah Lanphere Joline Maxcy, MD [facility]  If 7PM-7AM, please contact night-coverage  04/02/2023, 10:25 AM

## 2023-04-03 ENCOUNTER — Inpatient Hospital Stay (HOSPITAL_COMMUNITY): Payer: BC Managed Care – PPO

## 2023-04-03 DIAGNOSIS — I2609 Other pulmonary embolism with acute cor pulmonale: Secondary | ICD-10-CM | POA: Diagnosis not present

## 2023-04-03 LAB — CBC
HCT: 38.5 % (ref 36.0–46.0)
Hemoglobin: 12.3 g/dL (ref 12.0–15.0)
MCH: 29.9 pg (ref 26.0–34.0)
MCHC: 31.9 g/dL (ref 30.0–36.0)
MCV: 93.7 fL (ref 80.0–100.0)
Platelets: 283 10*3/uL (ref 150–400)
RBC: 4.11 MIL/uL (ref 3.87–5.11)
RDW: 13.2 % (ref 11.5–15.5)
WBC: 10.1 10*3/uL (ref 4.0–10.5)
nRBC: 0 % (ref 0.0–0.2)

## 2023-04-03 LAB — BASIC METABOLIC PANEL
Anion gap: 10 (ref 5–15)
BUN: 21 mg/dL — ABNORMAL HIGH (ref 6–20)
CO2: 32 mmol/L (ref 22–32)
Calcium: 8.2 mg/dL — ABNORMAL LOW (ref 8.9–10.3)
Chloride: 89 mmol/L — ABNORMAL LOW (ref 98–111)
Creatinine, Ser: 0.61 mg/dL (ref 0.44–1.00)
GFR, Estimated: >60 mL/min (ref >60)
Glucose, Bld: 131 mg/dL — ABNORMAL HIGH (ref 70–99)
Potassium: 3.8 mmol/L (ref 3.5–5.1)
Sodium: 131 mmol/L — ABNORMAL LOW (ref 135–145)

## 2023-04-03 LAB — MAGNESIUM: Magnesium: 1.9 mg/dL (ref 1.7–2.4)

## 2023-04-03 NOTE — Progress Notes (Signed)
   04/03/23 1912  BiPAP/CPAP/SIPAP  Reason BIPAP/CPAP not in use Other(comment) (No need of bipap at this time. No resp distress.)

## 2023-04-03 NOTE — Progress Notes (Signed)
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 Bertis Ruddy, peripheral neuropathy from chemotherapy, osteopenia, hemorrhoids, anxiety transferred from [facility] for progressive weakness over the past 3 weeks.  Patient was found to have a small left-sided large right-sided pleural effusion.  Transferred here for further management.  During the hospitalization patient had thoracentesis which improved her symptoms but still persist as this is likely malignant effusion.  Pulmonary and oncology team consulted.  Repeat thoracentesis on the right side removed 1.3 L.  Currently managing fluid with IV Lasix.   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  With repeat thoracentesis 6/20, removed 1.3 L from the right side. Chest x-ray shows some improvement today.  Might need another thoracentesis tomorrow morning.  Currently managing fluid with IV Lasix. Previous provider discussed with IR regarding pulmonary embolism but it is notably small Lower extremity Dopplers-negative for DVT Echocardiogram-EF 65% without any evidence of strain. As needed nebs.  I-S/flutter valve.  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  She is already had 2 right-sided thoracentesis with rapid reaccumulation of fluid.  Chest x-ray shows some improvement.  Managing fluid with IV Lasix. Right-sided thoracentesis 6/17-1.6 L removed Right-sided thoracentesis 6/20-1.3 L removed  Continue Lasix 40 mg IV twice daily  Sinus tachycardia - Secondary to intravascular dehydration, cachexia.  Hypokalemia - As needed repletion   Questionable cystitis Finished 3 days of Rocephin   Normocytic anemia, likely of chronic disease Stable, due to chemotherapy   Protein-calorie malnutrition, severe (HCC) Appreciate nutrition recommendations to ensure appropriate caloric intake *Of note patient states intolerance to OTC protein drinks(diarrhea)  Appreciate input from palliative care  Unfortunately patient remains to have poor prognosis PT OT recommended home health, face-to-face evaluation completed   DVT prophylaxis: Heparin drip> Eliquis Code Status: Full Family Communication: Husband at bedside Status is: Inpatient Ongoing management for recurrent effusion on IV diuretics.  I am hoping to discharge him home tomorrow     Diet Orders (From admission, onward)     Start     Ordered   03/29/23 0842  Diet regular Room service appropriate? Yes; Fluid consistency: Thin  Diet effective now       Question Answer Comment  Room service appropriate? Yes   Fluid consistency: Thin      03/29/23 0842            Subjective: Seen at bedside, does have exertional dyspnea.  Remains tachycardic at rest.  Overall still feels weak.  Examination: Constitutional: Not in acute distress, 4liter nasal cannula Respiratory: Diminished breath sounds at the bases Cardiovascular: Sinus tachycardia normal  sinus rhythm, no rubs Abdomen:  Nontender nondistended good bowel sounds Musculoskeletal: No edema noted Skin: No rashes seen Neurologic: CN 2-12 grossly intact.  And nonfocal Psychiatric: Normal judgment and insight. Alert and oriented x 3. Normal mood.    Objective: Vitals:   04/03/23 0008 04/03/23 0700 04/03/23 0800 04/03/23 0900  BP:   111/81   Pulse:  (!) 121 (!) 123 (!) 125  Resp:  (!) 22 (!) 23 20  Temp: 97.6 F (36.4 C)  (!) 97.5 F (36.4 C)   TempSrc: Axillary  Axillary   SpO2:  99% 99% 98%  Weight:      Height:        Intake/Output Summary (Last 24 hours) at 04/03/2023 0934 Last data filed at 04/03/2023 0900 Gross per 24 hour  Intake 120 ml  Output 450 ml  Net -330 ml    Filed Weights   03/28/23 1145  Weight: 56.9 kg    Scheduled Meds:  alteplase  2 mg Intracatheter Once   apixaban  10 mg Oral BID   Followed by   Melene Muller ON 04/07/2023] apixaban  5 mg Oral BID   Chlorhexidine Gluconate Cloth  6 each Topical Daily   feeding supplement (KATE FARMS STANDARD 1.4)  325 mL Oral BID BM   furosemide  40 mg Intravenous BID   metoprolol succinate  25 mg Oral QHS   pantoprazole  40 mg Oral Daily   sodium chloride flush  10-40 mL Intracatheter Q12H   Continuous Infusions:  sodium chloride Stopped (03/29/23 1806)    Nutritional status Signs/Symptoms: mild fat depletion, severe muscle depletion Interventions: Refer to RD note for recommendations Body mass index is 19.07 kg/m.  Data Reviewed:   CBC: Recent Labs  Lab 03/30/23 0208 03/31/23 0417 04/01/23 0506 04/02/23 0445 04/03/23 0535  WBC 8.1 8.5 11.5* 9.2 10.1  HGB 10.6* 11.6* 12.8 12.2 12.3  HCT 33.5* 36.7 40.7 37.6 38.5  MCV 95.7 95.6 94.2 93.3 93.7  PLT 258 263 310 270 283   Basic Metabolic Panel: Recent Labs  Lab 03/28/23 1245 03/29/23 0454 03/30/23 0744 03/31/23 0417 04/01/23 0506 04/02/23 0445 04/03/23 0535  NA 137   < > 139 136 136 134* 131*  K 3.5   < > 3.1* 4.0 3.1* 4.1 3.8  CL 103   < > 103 104 95* 94* 89*  CO2  22   < > 26 27 30 31  32  GLUCOSE 98   < > 106* 109* 129* 124* 131*  BUN 7   < > 7 6 11 17  21*  CREATININE 0.67   < > 0.55 0.57 0.45 0.55 0.61  CALCIUM 8.0*   < > 8.1* 8.2* 8.3* 8.5* 8.2*  MG 1.7  --   --  1.8 1.8 1.8 1.9  PHOS 3.7  --  3.5  --   --   --   --    < > = values in this interval not displayed.   GFR: Estimated Creatinine Clearance: 70.5 mL/min (by C-G formula based on SCr of 0.61 mg/dL). Liver Function Tests: Recent Labs  Lab 03/28/23 1245 03/29/23 0454  AST 15 13*  ALT 11 9  ALKPHOS 62 55  BILITOT 0.8 1.1  PROT 5.4* 5.5*  ALBUMIN 2.5* 2.9*   No results for input(s): "LIPASE", "AMYLASE" in the last 168 hours. No results for input(s): "AMMONIA" in the last 168 hours. Coagulation Profile: No results for input(s): "INR", "PROTIME" in the last 168 hours. Cardiac Enzymes: No results for  input(s): "CKTOTAL", "CKMB", "CKMBINDEX", "TROPONINI" in the last 168 hours. BNP (last 3 results) No results for input(s): "PROBNP" in the last 8760 hours. HbA1C: No results for input(s): "HGBA1C" in the last 72 hours. CBG: No results for input(s): "GLUCAP" in the last 168 hours. Lipid Profile: No results for input(s): "CHOL", "HDL", "LDLCALC", "TRIG", "CHOLHDL", "LDLDIRECT" in the last 72 hours. Thyroid Function Tests: No results for input(s): "TSH", "T4TOTAL", "FREET4", "T3FREE", "THYROIDAB" in the last 72 hours. Anemia Panel: No results for input(s): "VITAMINB12", "FOLATE", "FERRITIN", "TIBC", "IRON", "RETICCTPCT" in the last 72 hours. Sepsis Labs: Recent Labs  Lab 03/30/23 0744  PROCALCITON 0.32    Recent Results (from the past 240 hour(s))  MRSA Next Gen by PCR, Nasal     Status: None   Collection Time: 03/30/23  4:08 PM   Specimen: Nasal Mucosa; Nasal Swab  Result Value Ref Range Status   MRSA by PCR Next Gen NOT DETECTED NOT DETECTED Final    Comment: (NOTE) The GeneXpert MRSA Assay (FDA approved for NASAL specimens only), is one component of a comprehensive MRSA  colonization surveillance program. It is not intended to diagnose MRSA infection nor to guide or monitor treatment for MRSA infections. Test performance is not FDA approved in patients less than 58 years old. Performed at [facility], [address] [place], [place] [address]          Radiology Studies: DG Chest Port 1 View  Result Date: 04/03/2023 CLINICAL DATA:  Dyspnea EXAM: PORTABLE CHEST 1 VIEW COMPARISON:  Two days ago FINDINGS: Bands of opacity in the right mid and lower lung. Improved aeration at the right base with diaphragm now partially visualized. Pulmonary nodules bilaterally, known. Normal heart size and aortic contours. Porta catheter on the right in good position. IMPRESSION: Improved aeration at the right base.  No new abnormality. Electronically Signed   By: Tiburcio Pea M.D.   On: 04/03/2023 08:01           LOS: 6 days   Time spent= 35 mins    Jemarion Roycroft Joline Maxcy, MD [facility]  If 7PM-7AM, please contact night-coverage  04/03/2023, 9:34 AM

## 2023-04-04 ENCOUNTER — Inpatient Hospital Stay (HOSPITAL_COMMUNITY): Payer: BC Managed Care – PPO

## 2023-04-04 ENCOUNTER — Other Ambulatory Visit (HOSPITAL_COMMUNITY): Payer: Self-pay

## 2023-04-04 DIAGNOSIS — C7800 Secondary malignant neoplasm of unspecified lung: Secondary | ICD-10-CM | POA: Diagnosis not present

## 2023-04-04 DIAGNOSIS — I2609 Other pulmonary embolism with acute cor pulmonale: Secondary | ICD-10-CM | POA: Diagnosis not present

## 2023-04-04 DIAGNOSIS — J910 Malignant pleural effusion: Secondary | ICD-10-CM | POA: Diagnosis not present

## 2023-04-04 DIAGNOSIS — C55 Malignant neoplasm of uterus, part unspecified: Secondary | ICD-10-CM | POA: Diagnosis not present

## 2023-04-04 LAB — CBC
HCT: 37.8 % (ref 36.0–46.0)
Hemoglobin: 12.2 g/dL (ref 12.0–15.0)
MCH: 29.8 pg (ref 26.0–34.0)
MCHC: 32.3 g/dL (ref 30.0–36.0)
MCV: 92.2 fL (ref 80.0–100.0)
Platelets: 307 10*3/uL (ref 150–400)
RBC: 4.1 MIL/uL (ref 3.87–5.11)
RDW: 13.1 % (ref 11.5–15.5)
WBC: 10 10*3/uL (ref 4.0–10.5)
nRBC: 0 % (ref 0.0–0.2)

## 2023-04-04 LAB — BASIC METABOLIC PANEL
Anion gap: 13 (ref 5–15)
BUN: 20 mg/dL (ref 6–20)
CO2: 32 mmol/L (ref 22–32)
Calcium: 8.4 mg/dL — ABNORMAL LOW (ref 8.9–10.3)
Chloride: 86 mmol/L — ABNORMAL LOW (ref 98–111)
Creatinine, Ser: 0.62 mg/dL (ref 0.44–1.00)
GFR, Estimated: >60 mL/min (ref >60)
Glucose, Bld: 136 mg/dL — ABNORMAL HIGH (ref 70–99)
Potassium: 3.5 mmol/L (ref 3.5–5.1)
Sodium: 131 mmol/L — ABNORMAL LOW (ref 135–145)

## 2023-04-04 LAB — CYTOLOGY - NON PAP

## 2023-04-04 LAB — MAGNESIUM: Magnesium: 1.8 mg/dL (ref 1.7–2.4)

## 2023-04-04 MED ORDER — FUROSEMIDE 40 MG PO TABS
40.0000 mg | ORAL_TABLET | Freq: Every day | ORAL | 0 refills | Status: AC | PRN
  Filled 2023-04-04: qty 30, 30d supply, fill #0

## 2023-04-04 MED ORDER — HEPARIN SOD (PORK) LOCK FLUSH 100 UNIT/ML IV SOLN
500.0000 [IU] | INTRAVENOUS | Status: AC | PRN
  Administered 2023-04-04: 500 [IU]

## 2023-04-04 MED ORDER — SODIUM CHLORIDE 0.9 % IV BOLUS
500.0000 mL | Freq: Once | INTRAVENOUS | Status: AC
  Administered 2023-04-04: 500 mL via INTRAVENOUS

## 2023-04-04 MED ORDER — APIXABAN 5 MG PO TABS
ORAL_TABLET | ORAL | 0 refills | Status: AC
  Filled 2023-04-04: qty 68, 30d supply, fill #0

## 2023-04-04 MED ORDER — PROCHLORPERAZINE EDISYLATE 10 MG/2ML IJ SOLN
10.0000 mg | Freq: Once | INTRAMUSCULAR | Status: AC
  Administered 2023-04-04: 10 mg via INTRAVENOUS
  Filled 2023-04-04: qty 2

## 2023-04-04 NOTE — TOC Transition Note (Addendum)
Transition of Care [facility]) - CM/SW Discharge Note   Patient Details  Name: Tammie Gilmore MRN: [id number] Date of Birth: 1966/06/23  Transition of Care (TOC) CM/SW Contact:  Armanda Heritage, RN Phone Number: 04/04/2023, 10:38 AM   Clinical Narrative:     Patient will discharge home today, family to provide transportation.  HHPT/OT/side set up with [facility], referral accepted by rep Hassel Neth.  [employer] to deliver rollator and oxygen to bedside for home use.  No further TOC needs identified at this time.  Addendum 1124 am: [employer] rep notified patient now also in need of wheelchair for home use, referral updated and accepted.   Final next level of care: Home w Home Health Services Barriers to Discharge: No Barriers Identified   Patient Goals and CMS Choice CMS Medicare.gov Compare Post Acute Care list provided to:: Patient Choice offered to / list presented to : Patient  Discharge Placement                         Discharge Plan and Services Additional resources added to the After Visit Summary for   In-house Referral: NA Discharge Planning Services: CM Consult Post Acute Care Choice: Home Health          DME Arranged: Walker rolling with seat, Oxygen DME Agency: Other - Comment Loyal Buba) Date DME Agency Contacted: 04/04/23 Time DME Agency Contacted: 1013 Representative spoke with at DME Agency: Vaughan Basta HH Arranged: PT, OT, Nurse's Aide HH Agency: [facility] Date [facility] Agency Contacted: 04/04/23 Time HH Agency Contacted: 1037 Representative spoke with at [facility] Agency: Hassel Neth  Social Determinants of Health (SDOH) Interventions SDOH Screenings   Food Insecurity: No Food Insecurity (03/28/2023)  Housing: Low Risk  (03/28/2023)  Transportation Needs: No Transportation Needs (03/28/2023)  Utilities: Not At Risk (03/28/2023)  Depression (PHQ2-9): Low Risk  (11/08/2022)  Tobacco Use: Low Risk  (03/29/2023)     Readmission Risk Interventions     03/30/2023    6:08 PM  Readmission Risk Prevention Plan  Transportation Screening Complete  PCP or Specialist Appt within 5-7 Days Complete  Home Care Screening Complete  Medication Review (RN CM) Complete

## 2023-04-04 NOTE — Progress Notes (Signed)
SATURATION QUALIFICATIONS: (This note is used to comply with regulatory documentation for home oxygen)  Patient Saturations on Room Air at Rest = 94%  Patient Saturations on Room Air while Ambulating = 87%  Patient Saturations on 2 Liters of oxygen while Ambulating = 97%  Please briefly explain why patient needs home oxygen: Patient requires oxygen with exertion

## 2023-04-04 NOTE — Progress Notes (Signed)
    Durable Medical Equipment  (From admission, onward)           Start     Ordered   04/04/23 1122  For home use only DME standard manual wheelchair with seat cushion  Once       Comments: Patient suffers from recurrent metastatic leiyomyosacroca which impairs their ability to perform daily activities like bathing, dressing, and toileting in the home.  A cane, crutch, or walker will not resolve issue with performing activities of daily living. A wheelchair will allow patient to safely perform daily activities. Patient can safely propel the wheelchair in the home or has a caregiver who can provide assistance. Length of need Lifetime. Accessories: elevating leg rests (ELRs), wheel locks, extensions and anti-tippers.   04/04/23 1122   04/04/23 0916  For home use only DME 4 wheeled rolling walker with seat  Once       Question:  Patient needs a walker to treat with the following condition  Answer:  Ambulatory dysfunction   04/04/23 0915   04/04/23 0914  For home use only DME oxygen  Once       Comments: Dx Pleural effusion, recurrent. malignant  Question Answer Comment  Length of Need Lifetime   Mode or (Route) Nasal cannula   Liters per Minute 4   Oxygen delivery system Gas      04/04/23 0914

## 2023-04-04 NOTE — Discharge Summary (Signed)
Physician Discharge Summary  Tammie Gilmore WUJ:[id number] DOB: 07/17/1966 DOA: 03/28/2023  PCP: Juliette Alcide, MD  Admit date: 03/28/2023 Discharge date: 04/04/2023  Admitted From: Home Disposition: Home  Recommendations for Outpatient Follow-up:  Follow up with PCP in 1-2 weeks Please obtain BMP/CBC in one week your next doctors visit.  She has virtual outpatient oncology at [facility] tomorrow and chemotherapy planned on Wednesday.  Dr Bertis Ruddy to arrange for close outpatient follow-up with chest x-ray and as needed thoracentesis.  For now we will discharge her on Lasix 40 mg daily as needed with instructions on when and how to take it. 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 Bertis Ruddy, peripheral neuropathy from chemotherapy, osteopenia, hemorrhoids, anxiety transferred from [facility] for progressive weakness over the past 3 weeks.  Patient was found to have a small left-sided large right-sided pleural effusion.  Transferred here for further management.  During the hospitalization patient had thoracentesis which improved her symptoms but still persist as this is likely malignant effusion.  Pulmonary and oncology team consulted.  Repeat thoracentesis on the right side removed 1.3 L.  Currently managing fluid with IV Lasix. After 2 thoracentesis and Lasix in the hospital her symptoms improved.  Her repeat chest x-rays were overall stable but still requiring oxygen.  She will need close outpatient oncology follow-up.  This is malignant fluid and high risk of recurrence.  She needs to have discussions regarding long-term's goals of care and treatment with her outpatient oncology team. 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  With repeat thoracentesis 6/20, removed 1.3 L from the right side. Repeat chest x-ray stable.  Will discharge her on 40 mg of daily as needed Lasix.  Oncology to arrange for close follow-up outpatient Previous provider discussed with IR regarding pulmonary embolism but it is notably small Lower extremity Dopplers-negative for DVT Echocardiogram-EF 65% without any evidence of strain. As needed nebs.  I-S/flutter valve.  Out of bed to chair.  Nebulizer has been arranged at home.  As needed bronchodilators given   Presumed malignant pleural effusion  Uterine leiomyosarcoma (HCC) 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[facility]) -Unfortunately this is malignant effusion and my fear is that this will continue to recur.  She is already had 2 right-sided thoracentesis with rapid reaccumulation of fluid.  Repeat chest x-ray better.  Continue Lasix at home.  Will schedule outpatient weekly chest x-ray and thoracentesis as needed per oncology Right-sided thoracentesis 6/17-1.6 L removed Right-sided thoracentesis 6/20-1.3 L removed     Sinus tachycardia - Secondary to intravascular dehydration, cachexia.   Hypokalemia - As needed repletion   Questionable cystitis Finished 3 days of Rocephin   Normocytic  anemia, likely of chronic disease Stable, due to chemotherapy   Protein-calorie malnutrition, severe (HCC) Appreciate nutrition recommendations to ensure appropriate caloric intake *Of note patient states intolerance to OTC protein drinks(diarrhea)   Appreciate input from palliative care   Unfortunately patient remains to have poor prognosis PT OT recommended home health, face-to-face evaluation completed    Discharge Diagnoses:  Principal Problem:   Pulmonary embolism (HCC) Active Problems:   Uterine leiomyosarcoma (HCC)   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  Mild orthostasis with standing up and remains tachycardic but she understands this is due to her chronic state.  Discharge Exam: Vitals:   04/04/23 1030 04/04/23 1100  BP: 119/81 (!) 89/68  Pulse: (!) 110 (!) 112  Resp: (!) 23 19  Temp:    SpO2: 94% 96%   Vitals:   04/04/23 1015 04/04/23 1027 04/04/23 1030 04/04/23 1100  BP: 115/82 (!) 85/61 119/81 (!) 89/68  Pulse: (!) 102 95 (!) 110 (!) 112  Resp: (!) 25 (!) 25 (!) 23 19  Temp:      TempSrc:      SpO2: 94% 97% 94% 96%  Weight:      Height:        General: Pt is alert, awake, not in acute distress Cardiovascular: RRR, S1/S2 +, no rubs, no gallops, sinus tachycardia Respiratory: Some bibasilar rhonchi Abdominal: Soft, NT, ND, bowel sounds + Extremities: no edema, no cyanosis  Discharge Instructions   Allergies as of 04/04/2023       Reactions   Doxycycline Nausea Only, Other (See Comments)   Dizziness        Medication List     STOP  taking these medications    dexamethasone 4 MG tablet Commonly known as: DECADRON       TAKE these medications    acetaminophen 500 MG tablet Commonly known as: TYLENOL Take 1,000 mg by mouth every 6 (six) hours as needed for moderate pain or headache.   albuterol 108 (90 Base) MCG/ACT inhaler Commonly known as: VENTOLIN HFA Inhale 2 puffs into the lungs every 4 (four) hours as needed for wheezing or shortness of breath.   calcium carbonate 500 MG chewable tablet Commonly known as: TUMS - dosed in mg elemental calcium Chew 1,000 mg by mouth as needed for heartburn or indigestion.   Eliquis 5 MG Tabs tablet Generic drug: apixaban Take 2 tablets (10 mg total) by mouth 2 (two) times daily for 4 days, THEN 1 tablet (5 mg total) 2 (two) times daily for 26 days. Start taking on: April 07, 2023   estradiol 0.1 MG/GM vaginal cream Commonly known as: ESTRACE PLACE FINGER TIP SIZE AMOUNT OF CREAM AND INSERT SLIGHTLY PAST THE VAGINAL ENTRANCE 3 TIMES DAILY   furosemide 40 MG tablet Commonly known as: Lasix Take 1 tablet (40 mg total) by mouth daily as needed for edema or fluid.   lidocaine-prilocaine cream Commonly known as: EMLA Apply to affected area once What changed:  how much to take how to take this when to take this reasons to take this additional instructions   loratadine 10 MG tablet Commonly  known as: CLARITIN Take 10 mg by mouth daily as needed for allergies.   magic mouthwash (nystatin, diphenhydrAMINE, alum & mag hydroxide) suspension mixture Swish and spit 5 mLs 4 (four) times daily as needed for mouth pain.   metoprolol succinate 25 MG 24 hr tablet Commonly known as: Toprol XL Take 1 tablet (25 mg total) by mouth at bedtime.   OLANZapine 5 MG tablet Commonly known as: ZYPREXA Take by mouth.   ondansetron 8 MG tablet Commonly known as: Zofran Take 1 tablet (8 mg total) by mouth every 8 (eight) hours as needed. What changed: reasons to take this    pantoprazole 20 MG tablet Commonly known as: PROTONIX Take by mouth.   prochlorperazine 10 MG tablet Commonly known as: COMPAZINE Take 1 tablet (10 mg total) by mouth every 6 (six) hours as needed (Nausea or vomiting).   senna 8.6 MG Tabs tablet Commonly known as: SENOKOT Take 2 tablets by mouth as needed for mild constipation or moderate constipation.               Durable Medical Equipment  (From admission, onward)           Start     Ordered   04/04/23 1122  For home use only DME standard manual wheelchair with seat cushion  Once       Comments: Patient suffers from recurrent metastatic leiyomyosacroca which impairs their ability to perform daily activities like bathing, dressing, and toileting in the home.  A cane, crutch, or walker will not resolve issue with performing activities of daily living. A wheelchair will allow patient to safely perform daily activities. Patient can safely propel the wheelchair in the home or has a caregiver who can provide assistance. Length of need Lifetime. Accessories: elevating leg rests (ELRs), wheel locks, extensions and anti-tippers.   04/04/23 1122   04/04/23 0916  For home use only DME 4 wheeled rolling walker with seat  Once       Question:  Patient needs a walker to treat with the following condition  Answer:  Ambulatory dysfunction   04/04/23 0915   04/04/23 0914  For home use only DME oxygen  Once       Comments: Dx Pleural effusion, recurrent. malignant  Question Answer Comment  Length of Need Lifetime   Mode or (Route) Nasal cannula   Liters per Minute 4   Oxygen delivery system Gas      04/04/23 0914            Follow-up Information     [name], Ananias Pilgrim, MD Follow up in 1 week(s).   Specialty: Family Medicine Contact information: [address] [place] [address] [phone]         [facility] Follow up.   Specialty: Home Health Services Why: agency will provide home health PT/OT/aide.   agency will call you to schedule first visit. Contact information: [address] [place] [address] [phone]                Allergies  Allergen Reactions   Doxycycline Nausea Only and Other (See Comments)    Dizziness    You were cared for by a hospitalist during your hospital stay. If you have any questions about your discharge medications or the care you received while you were in the hospital after you are discharged, you can call the unit and asked to speak with the hospitalist on call if the hospitalist that took care  of you is not available. Once you are discharged, your primary care physician will handle any further medical issues. Please note that no refills for any discharge medications will be authorized once you are discharged, as it is imperative that you return to your primary care physician (or establish a relationship with a primary care physician if you do not have one) for your aftercare needs so that they can reassess your need for medications and monitor your lab values.  You were cared for by a hospitalist during your hospital stay. If you have any questions about your discharge medications or the care you received while you were in the hospital after you are discharged, you can call the unit and asked to speak with the hospitalist on call if the hospitalist that took care of you is not available. Once you are discharged, your primary care physician will handle any further medical issues. Please note that NO REFILLS for any discharge medications will be authorized once you are discharged, as it is imperative that you return to your primary care physician (or establish a relationship with a primary care physician if you do not have one) for your aftercare needs so that they can reassess your need for medications and monitor your lab values.  Please request your Prim.MD to go over all Hospital Tests and Procedure/Radiological results at the follow up, please  get all Hospital records sent to your Prim MD by signing hospital release before you go home.  Get CBC, CMP, 2 view Chest X ray checked  by Primary MD during your next visit or SNF MD in 5-7 days ( we routinely change or add medications that can affect your baseline labs and fluid status, therefore we recommend that you get the mentioned basic workup next visit with your PCP, your PCP may decide not to get them or add new tests based on their clinical decision)  On your next visit with your primary care physician please Get Medicines reviewed and adjusted.  If you experience worsening of your admission symptoms, develop shortness of breath, life threatening emergency, suicidal or homicidal thoughts you must seek medical attention immediately by calling 911 or calling your MD immediately  if symptoms less severe.  You Must read complete instructions/literature along with all the possible adverse reactions/side effects for all the Medicines you take and that have been prescribed to you. Take any new Medicines after you have completely understood and accpet all the possible adverse reactions/side effects.   Do not drive, operate heavy machinery, perform activities at heights, swimming or participation in water activities or provide baby sitting services if your were admitted for syncope or siezures until you have seen by Primary MD or a Neurologist and advised to do so again.  Do not drive when taking Pain medications.   Procedures/Studies: DG Chest Port 1 View  Result Date: 04/04/2023 CLINICAL DATA:  Shortness of breath EXAM: PORTABLE CHEST 1 VIEW COMPARISON:  04/03/2023 FINDINGS: Cardiac shadow is stable. Right chest wall port is again seen. Bilateral pulmonary nodules are again identified and stable. Persistent right base opacity is noted. No new focal abnormality is seen. IMPRESSION: No significant interval change from the previous day. Electronically Signed   By: Alcide Clever M.D.   On:  04/04/2023 09:41   DG Chest Port 1 View  Result Date: 04/03/2023 CLINICAL DATA:  Dyspnea EXAM: PORTABLE CHEST 1 VIEW COMPARISON:  Two days ago FINDINGS: Bands of opacity in the right mid and lower lung. Improved aeration at  the right base with diaphragm now partially visualized. Pulmonary nodules bilaterally, known. Normal heart size and aortic contours. Porta catheter on the right in good position. IMPRESSION: Improved aeration at the right base.  No new abnormality. Electronically Signed   By: Tiburcio Pea M.D.   On: 04/03/2023 08:01   DG Chest Port 1 View  Result Date: 04/01/2023 CLINICAL DATA:  Dyspnea EXAM: PORTABLE CHEST 1 VIEW COMPARISON:  03/31/2023 FINDINGS: Cardiac shadow is stable. Right chest wall port is again seen. Stable parenchymal nodules are noted on the left similar to that seen on prior CT examination. Right-sided pleural effusion is noted which has increased slightly from the prior plain film examination. No other focal abnormality is noted. IMPRESSION: Changes consistent with pulmonary metastatic disease. Right-sided effusion is noted slightly greater than that seen on the prior exam. Electronically Signed   By: Alcide Clever M.D.   On: 04/01/2023 10:06   US THORACENTESIS ASP PLEURAL SPACE W/IMG GUIDE  Result Date: 03/31/2023 INDICATION: 57 year old female with shortness of breath, previous CXR showed right pleural effusion. Request for therapeutic and diagnostic thoracentesis. EXAM: ULTRASOUND GUIDED RIGHT THORACENTESIS MEDICATIONS: 5 mL 1% lidocaine COMPLICATIONS: None immediate. PROCEDURE: An ultrasound guided thoracentesis was thoroughly discussed with the patient and questions answered. The benefits, risks, alternatives and complications were also discussed. The patient understands and wishes to proceed with the procedure. Written consent was obtained. Ultrasound was performed to localize and mark an adequate pocket of fluid in the right chest. The area was then prepped and  draped in the normal sterile fashion. 1% Lidocaine was used for local anesthesia. Under ultrasound guidance a 6 Fr Safe-T-Centesis catheter was introduced. Thoracentesis was performed. The catheter was removed and a dressing applied. FINDINGS: A total of approximately 1.3 L of hazy yellow fluid was removed. Samples were sent to the laboratory as requested by the clinical team. Post procedure chest X-ray reviewed, negative for pneumothorax. IMPRESSION: Successful ultrasound guided right thoracentesis yielding 1.3 L of pleural fluid. Performed by: Lawernce Ion, PA-C Electronically Signed   By: Simonne Come M.D.   On: 03/31/2023 15:12   DG CHEST PORT 1 VIEW  Result Date: 03/31/2023 CLINICAL DATA:  Pleural effusion.  Status post right thoracentesis. EXAM: PORTABLE CHEST 1 VIEW COMPARISON:  Chest x-ray from yesterday. FINDINGS: Unchanged right chest wall port catheter. Significantly decreased now trace right pleural effusion with markedly improved aeration of the right lung. Residual mild right basilar atelectasis. Unchanged bilateral pulmonary nodules. No pneumothorax. The heart size and mediastinal contours are within normal limits. No acute osseous abnormality. IMPRESSION: 1. Significantly decreased now trace right pleural effusion with markedly improved aeration of the right lung. No pneumothorax. Electronically Signed   By: Obie Dredge M.D.   On: 03/31/2023 13:54   DG Chest Port 1 View  Result Date: 03/30/2023 CLINICAL DATA:  Shortness of breath. EXAM: PORTABLE CHEST 1 VIEW COMPARISON:  03/28/2023 FINDINGS: Stable right IJ power port. Persistent large right pleural effusion and significant overlying atelectasis. Stable metastatic pulmonary nodules. IMPRESSION: Persistent large right pleural effusion and significant overlying atelectasis. Stable pulmonary metastatic disease. Electronically Signed   By: Rudie Meyer M.D.   On: 03/30/2023 16:18   ECHOCARDIOGRAM COMPLETE  Result Date: 03/29/2023     ECHOCARDIOGRAM REPORT   Patient Name:   JOBINA MAITA Date of Exam: 03/29/2023 Medical Rec #:  [id number]         Height:       68.0 in Accession #:    [id number]  Weight:       125.4 lb Date of Birth:  [birth year]        BSA:          1.676 m Patient Age:    56 years          BP:           162/90 mmHg Patient Gender: F                 HR:           106 bpm. Exam Location:  Inpatient Procedure: 2D Echo, Cardiac Doppler, Color Doppler and Strain Analysis Indications:    Pulmonary embolus  History:        Patient has prior history of Echocardiogram examinations, most                 recent 11/05/2022. Cancer, Signs/Symptoms:Dyspnea; Risk                 Factors:pulmonary embolism.  Sonographer:    Wallie Char Referring Phys: [id number] DAVID MANUEL ORTIZ  Sonographer Comments: Image acquisition challenging due to respiratory motion. Global longitudinal strain was attempted. IMPRESSIONS  1. Left ventricular ejection fraction, by estimation, is 65 to 70%. The left ventricle has normal function. The left ventricle has no regional wall motion abnormalities. Left ventricular diastolic parameters were normal.  2. Right ventricular systolic function is normal. The right ventricular size is normal. There is normal pulmonary artery systolic pressure. The estimated right ventricular systolic pressure is 27.4 mmHg.  3. The mitral valve is myxomatous. No evidence of mitral valve regurgitation. No evidence of mitral stenosis. 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  4. The aortic valve is tricuspid. Aortic valve regurgitation is mild. No aortic stenosis is present.  5. The inferior vena cava is normal in size with greater than 50% respiratory variability, suggesting right atrial pressure of 3 mmHg. Comparison(s): Prior images reviewed side by side. The left ventricular function has improved. The right ventricular systolic function has improved. FINDINGS  Left  Ventricle: Left ventricular ejection fraction, by estimation, is 65 to 70%. The left ventricle has normal function. The left ventricle has no regional wall motion abnormalities. Global longitudinal strain performed but not reported based on interpreter judgement due to suboptimal tracking. The left ventricular internal cavity size was normal in size. There is no left ventricular hypertrophy. Left ventricular diastolic parameters were normal. Right Ventricle: The right ventricular size is normal. No increase in right ventricular wall thickness. Right ventricular systolic function is normal. There is normal pulmonary artery systolic pressure. The tricuspid regurgitant velocity is 2.47 m/s, and  with an assumed right atrial pressure of 3 mmHg, the estimated right ventricular systolic pressure is 27.4 mmHg. Left Atrium: Left atrial size was normal in size. Right Atrium: Right atrial size was normal in size. Pericardium: There is no evidence of pericardial effusion. 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 No evidence of mitral valve regurgitation. No evidence of mitral valve stenosis. MV peak gradient, 4.7 mmHg. The mean mitral valve gradient is 2.0 mmHg. Tricuspid Valve: The tricuspid valve is normal in structure. Tricuspid valve regurgitation is mild . No evidence of tricuspid stenosis. Aortic Valve: The aortic valve is tricuspid. Aortic valve regurgitation is mild. No aortic stenosis is present. Aortic valve mean gradient measures 4.5 mmHg. Aortic valve  peak gradient measures 8.8 mmHg. Aortic valve area, by VTI measures 1.86 cm. Pulmonic Valve: The pulmonic valve was normal in structure. Pulmonic valve regurgitation is not visualized. No evidence of pulmonic stenosis. Aorta: The aortic root is normal in size and structure. Venous: The inferior vena cava is normal in size with greater than 50% respiratory  variability, suggesting right atrial pressure of 3 mmHg. IAS/Shunts: No atrial level shunt detected by color flow Doppler.  LEFT VENTRICLE PLAX 2D LVIDd:         3.60 cm     Diastology LVIDs:         2.70 cm     LV e' medial:    8.59 cm/s LV PW:         1.10 cm     LV E/e' medial:  10.0 LV IVS:        0.80 cm     LV e' lateral:   10.40 cm/s LVOT diam:     1.80 cm     LV E/e' lateral: 8.3 LV SV:         45 LV SV Index:   27 LVOT Area:     2.54 cm  LV Volumes (MOD) LV vol d, MOD A2C: 56.9 ml LV vol d, MOD A4C: 70.0 ml LV vol s, MOD A2C: 17.5 ml LV vol s, MOD A4C: 20.8 ml LV SV MOD A2C:     39.4 ml LV SV MOD A4C:     70.0 ml LV SV MOD BP:      45.1 ml RIGHT VENTRICLE             IVC RV Basal diam:  4.00 cm     IVC diam: 1.10 cm RV S prime:     19.30 cm/s TAPSE (M-mode): 2.2 cm LEFT ATRIUM             Index        RIGHT ATRIUM           Index LA diam:        2.30 cm 1.37 cm/m   RA Area:     11.70 cm LA Vol (A2C):   22.6 ml 13.48 ml/m  RA Volume:   30.70 ml  18.32 ml/m LA Vol (A4C):   26.3 ml 15.69 ml/m LA Biplane Vol: 24.6 ml 14.68 ml/m  AORTIC VALVE AV Area (Vmax):    1.91 cm AV Area (Vmean):   1.85 cm AV Area (VTI):     1.86 cm AV Vmax:           148.50 cm/s AV Vmean:          98.200 cm/s AV VTI:            0.242 m AV Peak Grad:      8.8 mmHg AV Mean Grad:      4.5 mmHg LVOT Vmax:         111.45 cm/s LVOT Vmean:        71.350 cm/s LVOT VTI:          0.177 m LVOT/AV VTI ratio: 0.73  AORTA Ao Root diam: 3.10 cm Ao Asc diam:  3.40 cm MITRAL VALVE               TRICUSPID VALVE MV Area (PHT): 2.56 cm    TR Peak grad:   24.4 mmHg MV Area VTI:   2.14 cm    TR Vmax:        247.00 cm/s MV Peak  grad:  4.7 mmHg MV Mean grad:  2.0 mmHg    SHUNTS MV Vmax:       1.08 m/s    Systemic VTI:  0.18 m MV Vmean:      65.9 cm/s   Systemic Diam: 1.80 cm MV Decel Time: 296 msec MV E velocity: 86.30 cm/s MV A velocity: 92.50 cm/s MV E/A ratio:  0.93 Mihai Croitoru MD Electronically signed by Thurmon Fair MD Signature Date/Time:  03/29/2023/9:56:43 AM    Final    DG CHEST PORT 1 VIEW  Result Date: 03/28/2023 CLINICAL DATA:  Increased shortness of breath EXAM: PORTABLE CHEST 1 VIEW COMPARISON:  03/28/2023, CT 02/11/2023 FINDINGS: Right-sided central venous port tip at the cavoatrial region. Bilateral pulmonary nodules corresponding to history of metastatic disease. At least moderate sized right pleural effusion which may be redistributed. Diffuse ground-glass opacity in the right thorax potentially due to edema. No convincing pneumothorax. Persistent consolidation at the right base. IMPRESSION: 1. Bilateral pulmonary nodules corresponding to history of metastatic disease. 2. At least moderate sized right pleural effusion which may be redistributed or slightly increased since exam earlier today. Diffuse ground-glass opacity in the right thorax appears worsened and may be due to asymmetric edema. Electronically Signed   By: Jasmine Pang M.D.   On: 03/28/2023 21:37   VAS [place] LOWER EXTREMITY VENOUS (DVT)  Result Date: 03/28/2023  Lower Venous DVT Study Patient Name:  CARAH BARRIENTES  Date of Exam:   03/28/2023 Medical Rec #: [id number]          Accession #:    [id number] Date of Birth: 1966-05-29         Patient Gender: F Patient Age:   85 years Exam Location:  [facility] - Inpatient Procedure:      VAS [place] LOWER EXTREMITY VENOUS (DVT) Referring Phys: DAVID ORTIZ --------------------------------------------------------------------------------  Indications: Pulmonary embolism.  Risk Factors: Confirmed PE Cancer. Anticoagulation: Heparin. Comparison Study: No prior studies. Performing Technologist: Chanda Busing RVT  Examination Guidelines: A complete evaluation includes B-mode imaging, spectral Doppler, color Doppler, and power Doppler as needed of all accessible portions of each vessel. Bilateral testing is considered an integral part of a complete examination. Limited examinations for reoccurring indications may be performed as noted. The  reflux portion of the exam is performed with the patient in reverse Trendelenburg.  +---------+---------------+---------+-----------+----------+--------------+ RIGHT    CompressibilityPhasicitySpontaneityPropertiesThrombus Aging +---------+---------------+---------+-----------+----------+--------------+ CFV      Full           Yes      Yes                                 +---------+---------------+---------+-----------+----------+--------------+ SFJ      Full                                                        +---------+---------------+---------+-----------+----------+--------------+ FV Prox  Full                                                        +---------+---------------+---------+-----------+----------+--------------+ FV Mid   Full                                                        +---------+---------------+---------+-----------+----------+--------------+  FV DistalFull                                                        +---------+---------------+---------+-----------+----------+--------------+ PFV      Full                                                        +---------+---------------+---------+-----------+----------+--------------+ POP      Full           Yes      Yes                                 +---------+---------------+---------+-----------+----------+--------------+ PTV      Full                                                        +---------+---------------+---------+-----------+----------+--------------+ PERO     Full                                                        +---------+---------------+---------+-----------+----------+--------------+   +---------+---------------+---------+-----------+----------+--------------+ LEFT     CompressibilityPhasicitySpontaneityPropertiesThrombus Aging +---------+---------------+---------+-----------+----------+--------------+ CFV      Full           Yes       Yes                                 +---------+---------------+---------+-----------+----------+--------------+ SFJ      Full                                                        +---------+---------------+---------+-----------+----------+--------------+ FV Prox  Full                                                        +---------+---------------+---------+-----------+----------+--------------+ FV Mid   Full                                                        +---------+---------------+---------+-----------+----------+--------------+ FV DistalFull                                                        +---------+---------------+---------+-----------+----------+--------------+  PFV      Full                                                        +---------+---------------+---------+-----------+----------+--------------+ POP      Full           Yes      Yes                                 +---------+---------------+---------+-----------+----------+--------------+ PTV      Full                                                        +---------+---------------+---------+-----------+----------+--------------+ PERO     Full                                                        +---------+---------------+---------+-----------+----------+--------------+     Summary: RIGHT: - There is no evidence of deep vein thrombosis in the lower extremity.  - No cystic structure found in the popliteal fossa.  LEFT: - There is no evidence of deep vein thrombosis in the lower extremity.  - No cystic structure found in the popliteal fossa.  *See table(s) above for measurements and observations. Electronically signed by Sherald Hess MD on 03/28/2023 at 5:36:09 PM.    Final    US THORACENTESIS ASP PLEURAL SPACE W/IMG GUIDE  Result Date: 03/28/2023 INDICATION: Patient with history of dyspnea, pulmonary embolus, bilateral pleural effusions right greater than left. Request  received for diagnostic and therapeutic right thoracentesis. EXAM: ULTRASOUND GUIDED DIAGNOSTIC AND THERAPEUTIC RIGHT THORACENTESIS MEDICATIONS: 8 mL of 1% lidocaine COMPLICATIONS: None immediate. PROCEDURE: An ultrasound guided thoracentesis was thoroughly discussed with the patient and questions answered. The benefits, risks, alternatives and complications were also discussed. The patient understands and wishes to proceed with the procedure. Written consent was obtained. Ultrasound was performed to localize and mark an adequate pocket of fluid in the right chest. The area was then prepped and draped in the normal sterile fashion. 1% Lidocaine was used for local anesthesia. Under ultrasound guidance a 6 Fr Safe-T-Centesis catheter was introduced. Thoracentesis was performed. The catheter was removed and a dressing applied. FINDINGS: A total of approximately 1.6 liters of clear, yellow fluid was removed. Samples were sent to the laboratory as requested by the clinical team. IMPRESSION: Successful ultrasound guided diagnostic and therapeutic right thoracentesis yielding 1.6 liters of pleural fluid. Performed by: Artemio Aly Electronically Signed   By: Simonne Come M.D.   On: 03/28/2023 17:05   DG Chest Port 1 View  Result Date: 03/28/2023 CLINICAL DATA:  Post right-sided thoracentesis. EXAM: PORTABLE CHEST 1 VIEW COMPARISON:  03/27/2023; chest CT-02/11/2023 FINDINGS: Interval reduction in persistent moderate sized partially loculated right-sided pleural effusion post thoracentesis. Improved aeration of the right lung with residual right mid and lower lung atelectasis/collapse. Improved visualization of known a proximally 1.8 cm right upper lung pulmonary nodule. No pneumothorax. Similar appearance  of the left lung including proximally 1.8 cm left lower lung pulmonary nodule. No evidence of edema. Grossly unchanged cardiac silhouette and mediastinal contours. Stable positioning of support apparatus. No acute  osseous abnormalities. IMPRESSION: 1. Interval reduction in persistent moderate sized partially loculated right-sided pleural effusion post thoracentesis. No pneumothorax. 2. Improved aeration of the right lung with residual right mid and lower lung atelectasis/collapse. 3. Redemonstrated known bilateral pulmonary metastases. Electronically Signed   By: Simonne Come M.D.   On: 03/28/2023 16:59   DG Outside Films Chest  Result Date: 03/28/2023 This examination belongs to an outside facility and is stored here for comparison purposes only.  Contact the originating outside institution for any associated report or interpretation.  CT OUTSIDE FILMS CHEST  Result Date: 03/28/2023 This examination belongs to an outside facility and is stored here for comparison purposes only.  Contact the originating outside institution for any associated report or interpretation.    The results of significant diagnostics from this hospitalization (including imaging, microbiology, ancillary and laboratory) are listed below for reference.     Microbiology: Recent Results (from the past 240 hour(s))  MRSA Next Gen by PCR, Nasal     Status: None   Collection Time: 03/30/23  4:08 PM   Specimen: Nasal Mucosa; Nasal Swab  Result Value Ref Range Status   MRSA by PCR Next Gen NOT DETECTED NOT DETECTED Final    Comment: (NOTE) The GeneXpert MRSA Assay (FDA approved for NASAL specimens only), is one component of a comprehensive MRSA colonization surveillance program. It is not intended to diagnose MRSA infection nor to guide or monitor treatment for MRSA infections. Test performance is not FDA approved in patients less than 59 years old. Performed at [facility], [address], [place], [place] [address]      Labs: BNP (last 3 results) Recent Labs    03/30/23 0745  BNP 56.6   Basic Metabolic Panel: Recent Labs  Lab 03/28/23 1245 03/29/23 0454 03/30/23 0744 03/31/23 0417 04/01/23 0506  04/02/23 0445 04/03/23 0535 04/04/23 0525  NA 137   < > 139 136 136 134* 131* 131*  K 3.5   < > 3.1* 4.0 3.1* 4.1 3.8 3.5  CL 103   < > 103 104 95* 94* 89* 86*  CO2 22   < > 26 27 30 31  32 32  GLUCOSE 98   < > 106* 109* 129* 124* 131* 136*  BUN 7   < > 7 6 11 17  21* 20  CREATININE 0.67   < > 0.55 0.57 0.45 0.55 0.61 0.62  CALCIUM 8.0*   < > 8.1* 8.2* 8.3* 8.5* 8.2* 8.4*  MG 1.7  --   --  1.8 1.8 1.8 1.9 1.8  PHOS 3.7  --  3.5  --   --   --   --   --    < > = values in this interval not displayed.   Liver Function Tests: Recent Labs  Lab 03/28/23 1245 03/29/23 0454  AST 15 13*  ALT 11 9  ALKPHOS 62 55  BILITOT 0.8 1.1  PROT 5.4* 5.5*  ALBUMIN 2.5* 2.9*   No results for input(s): "LIPASE", "AMYLASE" in the last 168 hours. No results for input(s): "AMMONIA" in the last 168 hours. CBC: Recent Labs  Lab 03/31/23 0417 04/01/23 0506 04/02/23 0445 04/03/23 0535 04/04/23 0525  WBC 8.5 11.5* 9.2 10.1 10.0  HGB 11.6* 12.8 12.2 12.3 12.2  HCT 36.7 40.7 37.6 38.5 37.8  MCV 95.6 94.2 93.3 93.7 92.2  PLT 263 310 270 283 307   Cardiac Enzymes: No results for input(s): "CKTOTAL", "CKMB", "CKMBINDEX", "TROPONINI" in the last 168 hours. BNP: Invalid input(s): "POCBNP" CBG: No results for input(s): "GLUCAP" in the last 168 hours. D-Dimer No results for input(s): "DDIMER" in the last 72 hours. Hgb A1c No results for input(s): "HGBA1C" in the last 72 hours. Lipid Profile No results for input(s): "CHOL", "HDL", "LDLCALC", "TRIG", "CHOLHDL", "LDLDIRECT" in the last 72 hours. Thyroid function studies No results for input(s): "TSH", "T4TOTAL", "T3FREE", "THYROIDAB" in the last 72 hours.  Invalid input(s): "FREET3" Anemia work up No results for input(s): "VITAMINB12", "FOLATE", "FERRITIN", "TIBC", "IRON", "RETICCTPCT" in the last 72 hours. Urinalysis    Component Value Date/Time   COLORURINE YELLOW 01/22/2022 1459   APPEARANCEUR HAZY (A) 01/22/2022 1459   LABSPEC 1.021  01/22/2022 1459   PHURINE 5.0 01/22/2022 1459   GLUCOSEU NEGATIVE 01/22/2022 1459   HGBUR MODERATE (A) 01/22/2022 1459   BILIRUBINUR NEGATIVE 01/22/2022 1459   KETONESUR NEGATIVE 01/22/2022 1459   PROTEINUR 100 (A) 01/22/2022 1459   NITRITE NEGATIVE 01/22/2022 1459   LEUKOCYTESUR TRACE (A) 01/22/2022 1459   Sepsis Labs Recent Labs  Lab 04/01/23 0506 04/02/23 0445 04/03/23 0535 04/04/23 0525  WBC 11.5* 9.2 10.1 10.0   Microbiology Recent Results (from the past 240 hour(s))  MRSA Next Gen by PCR, Nasal     Status: None   Collection Time: 03/30/23  4:08 PM   Specimen: Nasal Mucosa; Nasal Swab  Result Value Ref Range Status   MRSA by PCR Next Gen NOT DETECTED NOT DETECTED Final    Comment: (NOTE) The GeneXpert MRSA Assay (FDA approved for NASAL specimens only), is one component of a comprehensive MRSA colonization surveillance program. It is not intended to diagnose MRSA infection nor to guide or monitor treatment for MRSA infections. Test performance is not FDA approved in patients less than 1 years old. Performed at [facility], [address], [place], [place] [address]      Time coordinating discharge:  I have spent 35 minutes face to face with the patient and on the ward discussing the patients care, assessment, plan and disposition with other care givers. >50% of the time was devoted counseling the patient about the risks and benefits of treatment/Discharge disposition and coordinating care.   SIGNED:   Dimple Nanas, MD  [facility] 04/04/2023, 12:10 PM   If 7PM-7AM, please contact night-coverage

## 2023-04-04 NOTE — Progress Notes (Signed)
Tammie Gilmore   DOB:1966/05/07   ZO#:[id number]    ASSESSMENT & PLAN:   Metastatic leiomyosarcoma She is receiving treatment at [facility], due cycle 2 on 6/17, delayed due to admission Patient is interested to receive more treatment, but I cautioned the idea of rushing into treatment due to poor baseline performance status and unstable vital signs Continue supportive care She has virtual visit with her oncologist at [facility] tomorrow   Right pleural effusion Likely malignant I recommend intermittent thoracentesis for now rather than Pleurx since patient has just started on new chemo 3 weeks ago and could respond favorably to treatment However, if she changes her mind, then Pleurx would be appropriate if she is transitioned to palliative care/hospice Her x-ray from last 2 days showed improved air entry to the right lung She does not need repeat thoracentesis right now We will schedule weekly thoracentesis in the outpatient clinic as needed   recent changes in cardiac function, questionable fluid overload Her ECHO at [facility] suggest reduce EF just 10 days ago but repeat ECHo yesterday showed normal EF She felt better after recent thoracentesis and diuresis Recommend continue medical management and weaning off oxygen as tolerated   Pulmonary embolism Due to her active cancer. Recommend transition to NOAC if pulmonary team agree not to place Pleurx She would likely need oxygen therapy upon discharge   Protein calorie malnutrition Possibly contributing to 3rd spacing and recurrent pleural effusion The patient is now in catabolic state She has very poor oral intake I recommend high-protein supplemental drinks as tolerated We discussed importance of frequent small meals and high-protein intake as tolerated   Discharge planning Overall, she is optimistic she can be discharged soon I will arrange outpatient follow-up  All questions were answered. The patient knows to call the clinic with any  problems, questions or concerns.   The total time spent in the appointment was 40 minutes encounter with patients including review of chart and various tests results, discussions about plan of care and coordination of care plan  Artis Delay, MD 04/04/2023 8:02 AM  Subjective:  She is eating breakfast.  Husband by the bedside.  Oxygen saturation looks good and her breathing effort is normal.  I reviewed x-ray with the patient and her husband and discussed discharge planning  Objective:  Vitals:   04/04/23 0400 04/04/23 0418  BP: 91/66   Pulse: 98 (!) 112  Resp: 15 20  Temp:  97.9 F (36.6 C)  SpO2: 100% 100%     Intake/Output Summary (Last 24 hours) at 04/04/2023 0802 Last data filed at 04/03/2023 1800 Gross per 24 hour  Intake 120 ml  Output 1025 ml  Net -905 ml    GENERAL:alert, no distress and comfortable NEURO: alert & oriented x 3 with fluent speech, no focal motor/sensory deficits   Labs:  Recent Labs    02/01/23 0923 03/28/23 1245 03/29/23 0454 03/30/23 0744 04/02/23 0445 04/03/23 0535 04/04/23 0525  NA 141 137 137   < > 134* 131* 131*  K 3.9 3.5 3.2*   < > 4.1 3.8 3.5  CL 106 103 101   < > 94* 89* 86*  CO2 26 22 25    < > 31 32 32  GLUCOSE 101* 98 115*   < > 124* 131* 136*  BUN 13 7 6    < > 17 21* 20  CREATININE 0.68 0.67 0.66   < > 0.55 0.61 0.62  CALCIUM 9.4 8.0* 8.1*   < > 8.5* 8.2* 8.4*  GFRNONAA >60 >60 >60   < > >60 >60 >60  PROT 6.8 5.4* 5.5*  --   --   --   --   ALBUMIN 4.1 2.5* 2.9*  --   --   --   --   AST 13* 15 13*  --   --   --   --   ALT 8 11 9   --   --   --   --   ALKPHOS 84 62 55  --   --   --   --   BILITOT 0.4 0.8 1.1  --   --   --   --    < > = values in this interval not displayed.    Studies: I have reviewed x-ray from the last 2 days DG Chest Port 1 View  Result Date: 04/03/2023 CLINICAL DATA:  Dyspnea EXAM: PORTABLE CHEST 1 VIEW COMPARISON:  Two days ago FINDINGS: Bands of opacity in the right mid and lower lung. Improved  aeration at the right base with diaphragm now partially visualized. Pulmonary nodules bilaterally, known. Normal heart size and aortic contours. Porta catheter on the right in good position. IMPRESSION: Improved aeration at the right base.  No new abnormality. Electronically Signed   By: Tiburcio Pea M.D.   On: 04/03/2023 08:01   DG Chest Port 1 View  Result Date: 04/01/2023 CLINICAL DATA:  Dyspnea EXAM: PORTABLE CHEST 1 VIEW COMPARISON:  03/31/2023 FINDINGS: Cardiac shadow is stable. Right chest wall port is again seen. Stable parenchymal nodules are noted on the left similar to that seen on prior CT examination. Right-sided pleural effusion is noted which has increased slightly from the prior plain film examination. No other focal abnormality is noted. IMPRESSION: Changes consistent with pulmonary metastatic disease. Right-sided effusion is noted slightly greater than that seen on the prior exam. Electronically Signed   By: Alcide Clever M.D.   On: 04/01/2023 10:06   US THORACENTESIS ASP PLEURAL SPACE W/IMG GUIDE  Result Date: 03/31/2023 INDICATION: 57 year old female with shortness of breath, previous CXR showed right pleural effusion. Request for therapeutic and diagnostic thoracentesis. EXAM: ULTRASOUND GUIDED RIGHT THORACENTESIS MEDICATIONS: 5 mL 1% lidocaine COMPLICATIONS: None immediate. PROCEDURE: An ultrasound guided thoracentesis was thoroughly discussed with the patient and questions answered. The benefits, risks, alternatives and complications were also discussed. The patient understands and wishes to proceed with the procedure. Written consent was obtained. Ultrasound was performed to localize and mark an adequate pocket of fluid in the right chest. The area was then prepped and draped in the normal sterile fashion. 1% Lidocaine was used for local anesthesia. Under ultrasound guidance a 6 Fr Safe-T-Centesis catheter was introduced. Thoracentesis was performed. The catheter was removed and a  dressing applied. FINDINGS: A total of approximately 1.3 L of hazy yellow fluid was removed. Samples were sent to the laboratory as requested by the clinical team. Post procedure chest X-ray reviewed, negative for pneumothorax. IMPRESSION: Successful ultrasound guided right thoracentesis yielding 1.3 L of pleural fluid. Performed by: Lawernce Ion, PA-C Electronically Signed   By: Simonne Come M.D.   On: 03/31/2023 15:12   DG CHEST PORT 1 VIEW  Result Date: 03/31/2023 CLINICAL DATA:  Pleural effusion.  Status post right thoracentesis. EXAM: PORTABLE CHEST 1 VIEW COMPARISON:  Chest x-ray from yesterday. FINDINGS: Unchanged right chest wall port catheter. Significantly decreased now trace right pleural effusion with markedly improved aeration of the right lung. Residual mild right basilar atelectasis. Unchanged bilateral pulmonary nodules. No pneumothorax.  The heart size and mediastinal contours are within normal limits. No acute osseous abnormality. IMPRESSION: 1. Significantly decreased now trace right pleural effusion with markedly improved aeration of the right lung. No pneumothorax. Electronically Signed   By: Obie Dredge M.D.   On: 03/31/2023 13:54   DG Chest Port 1 View  Result Date: 03/30/2023 CLINICAL DATA:  Shortness of breath. EXAM: PORTABLE CHEST 1 VIEW COMPARISON:  03/28/2023 FINDINGS: Stable right IJ power port. Persistent large right pleural effusion and significant overlying atelectasis. Stable metastatic pulmonary nodules. IMPRESSION: Persistent large right pleural effusion and significant overlying atelectasis. Stable pulmonary metastatic disease. Electronically Signed   By: Rudie Meyer M.D.   On: 03/30/2023 16:18   ECHOCARDIOGRAM COMPLETE  Result Date: 03/29/2023    ECHOCARDIOGRAM REPORT   Patient Name:   VENITA SENG Date of Exam: 03/29/2023 Medical Rec #:  [id number]         Height:       68.0 in Accession #:    [id number]        Weight:       125.4 lb Date of Birth:  [birth year]         BSA:          1.676 m Patient Age:    56 years          BP:           162/90 mmHg Patient Gender: F                 HR:           106 bpm. Exam Location:  Inpatient Procedure: 2D Echo, Cardiac Doppler, Color Doppler and Strain Analysis Indications:    Pulmonary embolus  History:        Patient has prior history of Echocardiogram examinations, most                 recent 11/05/2022. Cancer, Signs/Symptoms:Dyspnea; Risk                 Factors:pulmonary embolism.  Sonographer:    Wallie Char Referring Phys: [id number] DAVID MANUEL ORTIZ  Sonographer Comments: Image acquisition challenging due to respiratory motion. Global longitudinal strain was attempted. IMPRESSIONS  1. Left ventricular ejection fraction, by estimation, is 65 to 70%. The left ventricle has normal function. The left ventricle has no regional wall motion abnormalities. Left ventricular diastolic parameters were normal.  2. Right ventricular systolic function is normal. The right ventricular size is normal. There is normal pulmonary artery systolic pressure. The estimated right ventricular systolic pressure is 27.4 mmHg.  3. The mitral valve is myxomatous. No evidence of mitral valve regurgitation. No evidence of mitral stenosis. 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  4. The aortic valve is tricuspid. Aortic valve regurgitation is mild. No aortic stenosis is present.  5. The inferior vena cava is normal in size with greater than 50% respiratory variability, suggesting right atrial pressure of 3 mmHg. Comparison(s): Prior images reviewed side by side. The left ventricular function has improved. The right ventricular systolic function has improved. FINDINGS  Left Ventricle: Left ventricular ejection fraction, by estimation, is 65 to 70%. The left ventricle has normal function. The left ventricle has no regional wall motion abnormalities. Global longitudinal strain performed but not  reported based on interpreter judgement due to suboptimal tracking. The left ventricular internal cavity size was normal in size. There is no left ventricular hypertrophy. Left  ventricular diastolic parameters were normal. Right Ventricle: The right ventricular size is normal. No increase in right ventricular wall thickness. Right ventricular systolic function is normal. There is normal pulmonary artery systolic pressure. The tricuspid regurgitant velocity is 2.47 m/s, and  with an assumed right atrial pressure of 3 mmHg, the estimated right ventricular systolic pressure is 27.4 mmHg. Left Atrium: Left atrial size was normal in size. Right Atrium: Right atrial size was normal in size. Pericardium: There is no evidence of pericardial effusion. 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 No evidence of mitral valve regurgitation. No evidence of mitral valve stenosis. MV peak gradient, 4.7 mmHg. The mean mitral valve gradient is 2.0 mmHg. Tricuspid Valve: The tricuspid valve is normal in structure. Tricuspid valve regurgitation is mild . No evidence of tricuspid stenosis. Aortic Valve: The aortic valve is tricuspid. Aortic valve regurgitation is mild. No aortic stenosis is present. Aortic valve mean gradient measures 4.5 mmHg. Aortic valve peak gradient measures 8.8 mmHg. Aortic valve area, by VTI measures 1.86 cm. Pulmonic Valve: The pulmonic valve was normal in structure. Pulmonic valve regurgitation is not visualized. No evidence of pulmonic stenosis. Aorta: The aortic root is normal in size and structure. Venous: The inferior vena cava is normal in size with greater than 50% respiratory variability, suggesting right atrial pressure of 3 mmHg. IAS/Shunts: No atrial level shunt detected by color flow Doppler.  LEFT VENTRICLE PLAX 2D LVIDd:         3.60 cm     Diastology LVIDs:         2.70 cm     LV e' medial:     8.59 cm/s LV PW:         1.10 cm     LV E/e' medial:  10.0 LV IVS:        0.80 cm     LV e' lateral:   10.40 cm/s LVOT diam:     1.80 cm     LV E/e' lateral: 8.3 LV SV:         45 LV SV Index:   27 LVOT Area:     2.54 cm  LV Volumes (MOD) LV vol d, MOD A2C: 56.9 ml LV vol d, MOD A4C: 70.0 ml LV vol s, MOD A2C: 17.5 ml LV vol s, MOD A4C: 20.8 ml LV SV MOD A2C:     39.4 ml LV SV MOD A4C:     70.0 ml LV SV MOD BP:      45.1 ml RIGHT VENTRICLE             IVC RV Basal diam:  4.00 cm     IVC diam: 1.10 cm RV S prime:     19.30 cm/s TAPSE (M-mode): 2.2 cm LEFT ATRIUM             Index        RIGHT ATRIUM           Index LA diam:        2.30 cm 1.37 cm/m   RA Area:     11.70 cm LA Vol (A2C):   22.6 ml 13.48 ml/m  RA Volume:   30.70 ml  18.32 ml/m LA Vol (A4C):   26.3 ml 15.69 ml/m LA Biplane Vol: 24.6 ml 14.68 ml/m  AORTIC VALVE AV Area (Vmax):    1.91 cm AV Area (Vmean):   1.85  cm AV Area (VTI):     1.86 cm AV Vmax:           148.50 cm/s AV Vmean:          98.200 cm/s AV VTI:            0.242 m AV Peak Grad:      8.8 mmHg AV Mean Grad:      4.5 mmHg LVOT Vmax:         111.45 cm/s LVOT Vmean:        71.350 cm/s LVOT VTI:          0.177 m LVOT/AV VTI ratio: 0.73  AORTA Ao Root diam: 3.10 cm Ao Asc diam:  3.40 cm MITRAL VALVE               TRICUSPID VALVE MV Area (PHT): 2.56 cm    TR Peak grad:   24.4 mmHg MV Area VTI:   2.14 cm    TR Vmax:        247.00 cm/s MV Peak grad:  4.7 mmHg MV Mean grad:  2.0 mmHg    SHUNTS MV Vmax:       1.08 m/s    Systemic VTI:  0.18 m MV Vmean:      65.9 cm/s   Systemic Diam: 1.80 cm MV Decel Time: 296 msec MV E velocity: 86.30 cm/s MV A velocity: 92.50 cm/s MV E/A ratio:  0.93 Mihai Croitoru MD Electronically signed by Thurmon Fair MD Signature Date/Time: 03/29/2023/9:56:43 AM    Final    DG CHEST PORT 1 VIEW  Result Date: 03/28/2023 CLINICAL DATA:  Increased shortness of breath EXAM: PORTABLE CHEST 1 VIEW COMPARISON:  03/28/2023, CT 02/11/2023 FINDINGS: Right-sided central  venous port tip at the cavoatrial region. Bilateral pulmonary nodules corresponding to history of metastatic disease. At least moderate sized right pleural effusion which may be redistributed. Diffuse ground-glass opacity in the right thorax potentially due to edema. No convincing pneumothorax. Persistent consolidation at the right base. IMPRESSION: 1. Bilateral pulmonary nodules corresponding to history of metastatic disease. 2. At least moderate sized right pleural effusion which may be redistributed or slightly increased since exam earlier today. Diffuse ground-glass opacity in the right thorax appears worsened and may be due to asymmetric edema. Electronically Signed   By: Jasmine Pang M.D.   On: 03/28/2023 21:37   VAS [place] LOWER EXTREMITY VENOUS (DVT)  Result Date: 03/28/2023  Lower Venous DVT Study Patient Name:  CHRISLYNN MOSELY  Date of Exam:   03/28/2023 Medical Rec #: [id number]          Accession #:    [id number] Date of Birth: [birth year]         Patient Gender: F Patient Age:   73 years Exam Location:  [facility] Procedure:      VAS [place] LOWER EXTREMITY VENOUS (DVT) Referring Phys: DAVID ORTIZ --------------------------------------------------------------------------------  Indications: Pulmonary embolism.  Risk Factors: Confirmed PE Cancer. Anticoagulation: Heparin. Comparison Study: No prior studies. Performing Technologist: Chanda Busing RVT  Examination Guidelines: A complete evaluation includes B-mode imaging, spectral Doppler, color Doppler, and power Doppler as needed of all accessible portions of each vessel. Bilateral testing is considered an integral part of a complete examination. Limited examinations for reoccurring indications may be performed as noted. The reflux portion of the exam is performed with the patient in reverse Trendelenburg.  +---------+---------------+---------+-----------+----------+--------------+ RIGHT     CompressibilityPhasicitySpontaneityPropertiesThrombus Aging +---------+---------------+---------+-----------+----------+--------------+ CFV      Full  Yes      Yes                                 +---------+---------------+---------+-----------+----------+--------------+ SFJ      Full                                                        +---------+---------------+---------+-----------+----------+--------------+ FV Prox  Full                                                        +---------+---------------+---------+-----------+----------+--------------+ FV Mid   Full                                                        +---------+---------------+---------+-----------+----------+--------------+ FV DistalFull                                                        +---------+---------------+---------+-----------+----------+--------------+ PFV      Full                                                        +---------+---------------+---------+-----------+----------+--------------+ POP      Full           Yes      Yes                                 +---------+---------------+---------+-----------+----------+--------------+ PTV      Full                                                        +---------+---------------+---------+-----------+----------+--------------+ PERO     Full                                                        +---------+---------------+---------+-----------+----------+--------------+   +---------+---------------+---------+-----------+----------+--------------+ LEFT     CompressibilityPhasicitySpontaneityPropertiesThrombus Aging +---------+---------------+---------+-----------+----------+--------------+ CFV      Full           Yes      Yes                                 +---------+---------------+---------+-----------+----------+--------------+ SFJ  Full                                                         +---------+---------------+---------+-----------+----------+--------------+ FV Prox  Full                                                        +---------+---------------+---------+-----------+----------+--------------+ FV Mid   Full                                                        +---------+---------------+---------+-----------+----------+--------------+ FV DistalFull                                                        +---------+---------------+---------+-----------+----------+--------------+ PFV      Full                                                        +---------+---------------+---------+-----------+----------+--------------+ POP      Full           Yes      Yes                                 +---------+---------------+---------+-----------+----------+--------------+ PTV      Full                                                        +---------+---------------+---------+-----------+----------+--------------+ PERO     Full                                                        +---------+---------------+---------+-----------+----------+--------------+     Summary: RIGHT: - There is no evidence of deep vein thrombosis in the lower extremity.  - No cystic structure found in the popliteal fossa.  LEFT: - There is no evidence of deep vein thrombosis in the lower extremity.  - No cystic structure found in the popliteal fossa.  *See table(s) above for measurements and observations. Electronically signed by Sherald Hess MD on 03/28/2023 at 5:36:09 PM.    Final    US THORACENTESIS ASP PLEURAL SPACE W/IMG GUIDE  Result Date: 03/28/2023 INDICATION: Patient with history of dyspnea, pulmonary embolus, bilateral pleural effusions right greater than left. Request received for diagnostic and therapeutic right thoracentesis. EXAM: ULTRASOUND GUIDED DIAGNOSTIC  AND THERAPEUTIC RIGHT THORACENTESIS MEDICATIONS: 8 mL of 1% lidocaine  COMPLICATIONS: None immediate. PROCEDURE: An ultrasound guided thoracentesis was thoroughly discussed with the patient and questions answered. The benefits, risks, alternatives and complications were also discussed. The patient understands and wishes to proceed with the procedure. Written consent was obtained. Ultrasound was performed to localize and mark an adequate pocket of fluid in the right chest. The area was then prepped and draped in the normal sterile fashion. 1% Lidocaine was used for local anesthesia. Under ultrasound guidance a 6 Fr Safe-T-Centesis catheter was introduced. Thoracentesis was performed. The catheter was removed and a dressing applied. FINDINGS: A total of approximately 1.6 liters of clear, yellow fluid was removed. Samples were sent to the laboratory as requested by the clinical team. IMPRESSION: Successful ultrasound guided diagnostic and therapeutic right thoracentesis yielding 1.6 liters of pleural fluid. Performed by: Artemio Aly Electronically Signed   By: Simonne Come M.D.   On: 03/28/2023 17:05   DG Chest Port 1 View  Result Date: 03/28/2023 CLINICAL DATA:  Post right-sided thoracentesis. EXAM: PORTABLE CHEST 1 VIEW COMPARISON:  03/27/2023; chest CT-02/11/2023 FINDINGS: Interval reduction in persistent moderate sized partially loculated right-sided pleural effusion post thoracentesis. Improved aeration of the right lung with residual right mid and lower lung atelectasis/collapse. Improved visualization of known a proximally 1.8 cm right upper lung pulmonary nodule. No pneumothorax. Similar appearance of the left lung including proximally 1.8 cm left lower lung pulmonary nodule. No evidence of edema. Grossly unchanged cardiac silhouette and mediastinal contours. Stable positioning of support apparatus. No acute osseous abnormalities. IMPRESSION: 1. Interval reduction in persistent moderate sized partially loculated right-sided pleural effusion post thoracentesis. No  pneumothorax. 2. Improved aeration of the right lung with residual right mid and lower lung atelectasis/collapse. 3. Redemonstrated known bilateral pulmonary metastases. Electronically Signed   By: Simonne Come M.D.   On: 03/28/2023 16:59   DG Outside Films Chest  Result Date: 03/28/2023 This examination belongs to an outside facility and is stored here for comparison purposes only.  Contact the originating outside institution for any associated report or interpretation.  CT OUTSIDE FILMS CHEST  Result Date: 03/28/2023 This examination belongs to an outside facility and is stored here for comparison purposes only.  Contact the originating outside institution for any associated report or interpretation.

## 2023-04-04 NOTE — Plan of Care (Signed)

## 2023-04-05 ENCOUNTER — Telehealth: Payer: Self-pay

## 2023-04-05 ENCOUNTER — Other Ambulatory Visit: Payer: Self-pay | Admitting: Hematology and Oncology

## 2023-04-05 DIAGNOSIS — C7800 Secondary malignant neoplasm of unspecified lung: Secondary | ICD-10-CM

## 2023-04-05 DIAGNOSIS — J910 Malignant pleural effusion: Secondary | ICD-10-CM

## 2023-04-05 NOTE — Telephone Encounter (Signed)
Called and given below message. Instructed to go to [facility] on Thursday am for chest xray. Given appt for thoracentesis at [facility] on 6/28, arrive at 1030 for 11 am appt. She verbalized understanding.

## 2023-04-05 NOTE — Telephone Encounter (Signed)
-----   Message from Artis Delay, MD sent at 04/05/2023  7:57 AM EDT ----- Regarding: recurrent effusion She was DC yesterday Per discussion, please tell her to get CXR repeated on Thursday morning, will call her with results Please go ahead and schedule thoracentesis on Friday; we will cancel if do not need

## 2023-04-07 ENCOUNTER — Telehealth: Payer: Self-pay

## 2023-04-07 ENCOUNTER — Ambulatory Visit (HOSPITAL_COMMUNITY)
Admission: RE | Admit: 2023-04-07 | Discharge: 2023-04-07 | Disposition: A | Discharge status: Home / Self Care | Payer: BC Managed Care – PPO | Source: Ambulatory Visit | Attending: Hematology and Oncology | Admitting: Hematology and Oncology

## 2023-04-07 DIAGNOSIS — J910 Malignant pleural effusion: Secondary | ICD-10-CM | POA: Insufficient documentation

## 2023-04-07 DIAGNOSIS — C7800 Secondary malignant neoplasm of unspecified lung: Secondary | ICD-10-CM | POA: Diagnosis present

## 2023-04-07 NOTE — Telephone Encounter (Signed)
-----   Message from Artis Delay, MD sent at 04/07/2023 11:54 AM EDT ----- I reviewed the XRAY  I see some fluids I recommend she keeps her appt on Friday for thoracentesis

## 2023-04-07 NOTE — Telephone Encounter (Signed)
Called and given below message. She verbalize understanding and will keep appt for tomorrow.

## 2023-04-08 ENCOUNTER — Ambulatory Visit (HOSPITAL_COMMUNITY)
Admission: RE | Admit: 2023-04-08 | Discharge: 2023-04-08 | Disposition: A | Discharge status: Home / Self Care | Payer: BC Managed Care – PPO | Source: Ambulatory Visit | Attending: Physician Assistant | Admitting: Physician Assistant

## 2023-04-08 ENCOUNTER — Telehealth: Payer: Self-pay

## 2023-04-08 ENCOUNTER — Ambulatory Visit (HOSPITAL_COMMUNITY)
Admission: RE | Admit: 2023-04-08 | Discharge: 2023-04-08 | Disposition: A | Discharge status: Home / Self Care | Payer: BC Managed Care – PPO | Source: Ambulatory Visit | Attending: Hematology and Oncology | Admitting: Hematology and Oncology

## 2023-04-08 DIAGNOSIS — C7800 Secondary malignant neoplasm of unspecified lung: Secondary | ICD-10-CM | POA: Insufficient documentation

## 2023-04-08 DIAGNOSIS — J910 Malignant pleural effusion: Secondary | ICD-10-CM | POA: Insufficient documentation

## 2023-04-08 MED ORDER — LIDOCAINE HCL 1 % IJ SOLN
INTRAMUSCULAR | Status: AC
  Filled 2023-04-08: qty 20

## 2023-04-08 NOTE — Procedures (Signed)
PROCEDURE SUMMARY:  Successful image-guided right thoracentesis. Yielded 500 milliliters of clear, dark red fluid. Patient tolerated procedure well. EBL: Zero No immediate complications.  Specimen was not sent for labs. Post procedure CXR shows no pneumothorax.  Please see imaging section of Epic for full dictation.  Villa Herb PA-C 04/08/2023 11:07 AM

## 2023-04-08 NOTE — Telephone Encounter (Signed)
Returned her call. She is upset and realized that she did not get incentive spirometer when she went home on 6/24. Her husband bought her a volumetric exerciser. Reviewed how to use and told her that she can take deep breaths and cough deep at times. She verbalized understanding.

## 2023-04-11 ENCOUNTER — Other Ambulatory Visit: Payer: Self-pay | Admitting: Hematology and Oncology

## 2023-04-11 ENCOUNTER — Telehealth: Payer: Self-pay

## 2023-04-11 DIAGNOSIS — C7800 Secondary malignant neoplasm of unspecified lung: Secondary | ICD-10-CM

## 2023-04-11 DIAGNOSIS — J910 Malignant pleural effusion: Secondary | ICD-10-CM

## 2023-04-11 NOTE — Telephone Encounter (Signed)
-----   Message from Artis Delay, MD sent at 04/11/2023  8:39 AM EDT ----- Please schedule thoracentesis for Friday After that, call her and advise her to get CXR done at [facility] on Wednesday morning

## 2023-04-11 NOTE — Telephone Encounter (Signed)
Called and given below message. Given appt  for thoracentesis at [facility] on 7/5 at 1:15 pm, arrive at 1 pm. She verbalized understanding.

## 2023-04-13 ENCOUNTER — Ambulatory Visit (HOSPITAL_COMMUNITY)
Admission: RE | Admit: 2023-04-13 | Discharge: 2023-04-13 | Disposition: A | Discharge status: Home / Self Care | Payer: BC Managed Care – PPO | Source: Ambulatory Visit | Attending: Hematology and Oncology | Admitting: Hematology and Oncology

## 2023-04-13 ENCOUNTER — Telehealth: Payer: Self-pay

## 2023-04-13 ENCOUNTER — Other Ambulatory Visit: Payer: Self-pay

## 2023-04-13 DIAGNOSIS — C55 Malignant neoplasm of uterus, part unspecified: Secondary | ICD-10-CM

## 2023-04-13 DIAGNOSIS — J910 Malignant pleural effusion: Secondary | ICD-10-CM | POA: Diagnosis present

## 2023-04-13 DIAGNOSIS — C7800 Secondary malignant neoplasm of unspecified lung: Secondary | ICD-10-CM | POA: Diagnosis present

## 2023-04-13 DIAGNOSIS — C7801 Secondary malignant neoplasm of right lung: Secondary | ICD-10-CM

## 2023-04-13 MED ORDER — ONDANSETRON HCL 8 MG PO TABS
8.0000 mg | ORAL_TABLET | Freq: Three times a day (TID) | ORAL | 1 refills | Status: DC | PRN
Start: 2023-04-13 — End: 2023-08-10

## 2023-04-13 NOTE — Telephone Encounter (Signed)
-----   Message from Artis Delay, MD sent at 04/13/2023  9:45 AM EDT ----- Please call and recommend she keeps her appt for thoracentesis We will reorder appt next week

## 2023-04-13 NOTE — Telephone Encounter (Signed)
Called and given below message. Sent her zofran Rx to her pharmacy per her request. She has been unable to get Dr. Eulogio Ditch office to call her back.

## 2023-04-15 ENCOUNTER — Ambulatory Visit (HOSPITAL_COMMUNITY)
Admission: RE | Admit: 2023-04-15 | Discharge: 2023-04-15 | Disposition: A | Discharge status: Home / Self Care | Payer: BC Managed Care – PPO | Source: Ambulatory Visit | Attending: Hematology and Oncology | Admitting: Hematology and Oncology

## 2023-04-15 ENCOUNTER — Telehealth: Payer: Self-pay

## 2023-04-15 ENCOUNTER — Other Ambulatory Visit: Payer: Self-pay | Admitting: Hematology and Oncology

## 2023-04-15 ENCOUNTER — Ambulatory Visit (HOSPITAL_COMMUNITY)
Admission: RE | Admit: 2023-04-15 | Discharge: 2023-04-15 | Disposition: A | Discharge status: Home / Self Care | Payer: BC Managed Care – PPO | Source: Ambulatory Visit | Attending: Diagnostic Radiology | Admitting: Diagnostic Radiology

## 2023-04-15 ENCOUNTER — Encounter (HOSPITAL_COMMUNITY): Payer: Self-pay

## 2023-04-15 DIAGNOSIS — C7800 Secondary malignant neoplasm of unspecified lung: Secondary | ICD-10-CM | POA: Insufficient documentation

## 2023-04-15 DIAGNOSIS — J910 Malignant pleural effusion: Secondary | ICD-10-CM | POA: Insufficient documentation

## 2023-04-15 DIAGNOSIS — C499 Malignant neoplasm of connective and soft tissue, unspecified: Secondary | ICD-10-CM | POA: Insufficient documentation

## 2023-04-15 MED ORDER — LIDOCAINE HCL (PF) 2 % IJ SOLN
10.0000 mL | Freq: Once | INTRAMUSCULAR | Status: AC
  Administered 2023-04-15: 10 mL

## 2023-04-15 MED ORDER — LIDOCAINE HCL (PF) 2 % IJ SOLN
INTRAMUSCULAR | Status: AC
  Filled 2023-04-15: qty 10

## 2023-04-15 NOTE — Procedures (Signed)
INDICATION: Metastatic leiomyosarcoma with malignant pleural effusion  EXAM: ULTRASOUND GUIDED RIGHT THORACENTESIS GRIP-IR: Category: Fluids  Subcategory: Thoracentesis  Follow-Up: None  MEDICATIONS: None. COMPLICATIONS: None immediate. PROCEDURE: An ultrasound guided thoracentesis was thoroughly discussed with the patient and questions answered.  The benefits, risks (including hemorrhage, infection, and pneumothorax), alternatives and complications were also discussed.  The patient understood and elected to proceed with the procedure.  Written consent was obtained.  Standard timeout was employed.  Ultrasound was performed to localize and mark an adequate pocket of fluid in the left posterior chest.  The area was then prepped and draped in the normal sterile fashion. 1% Lidocaine was used for local anesthesia.  Under ultrasound guidance a 6 Fr Safe-T-Centesis catheter was introduced. Thoracentesis was performed. The catheter was removed and a dressing applied.  FINDINGS: A total of approximately 800 cc of amber fluid was removed.  IMPRESSION: Successful ultrasound guided right thoracentesis yielding 800 cc of pleural fluid.

## 2023-04-15 NOTE — Telephone Encounter (Signed)
-----   Message from Artis Delay, MD sent at 04/15/2023  2:35 PM EDT ----- She had thoracentesis today Please get her to get CXR Thursday morning and schedule US thoracentesis here or [facility] on Friday

## 2023-04-15 NOTE — Telephone Encounter (Signed)
Called and scheduled thoracentesis at [facility] on 7/12 at 10 am, arrive at 0930. Go for chest x-ray on 7/11 in the am at [facility]/ order placed for chest x-ray. Danice is aware of appt and will call the office for questions.

## 2023-04-21 ENCOUNTER — Ambulatory Visit (HOSPITAL_COMMUNITY)
Admission: RE | Admit: 2023-04-21 | Discharge: 2023-04-21 | Disposition: A | Discharge status: Home / Self Care | Payer: BC Managed Care – PPO | Source: Ambulatory Visit | Attending: Hematology and Oncology | Admitting: Hematology and Oncology

## 2023-04-21 ENCOUNTER — Other Ambulatory Visit: Payer: Self-pay | Admitting: Hematology and Oncology

## 2023-04-21 ENCOUNTER — Telehealth: Payer: Self-pay

## 2023-04-21 DIAGNOSIS — J910 Malignant pleural effusion: Secondary | ICD-10-CM | POA: Insufficient documentation

## 2023-04-21 DIAGNOSIS — C7800 Secondary malignant neoplasm of unspecified lung: Secondary | ICD-10-CM | POA: Diagnosis present

## 2023-04-21 NOTE — Telephone Encounter (Signed)
TC to pt to inform her that she will not need a thoracentesis tomorrow per Dr. Bertis Ruddy. Notified that she will need to have another CXR on 04/28/23 at [facility] and that she has an appt scheduled for thoracentesis on 04/29/23 at [facility] at 10:30 if she needs it--to be determined by CXR results next week.  Informed that there was no availability at [facility] but that it may be possible for an appointment to open up there by the end of next week if she needs the thoracentesis. Pt asks about Dr. Bertis Ruddy refilling Protonix Rx that was prescribed to her while she was in the hospital approx one month ago. Advised to contact her PCP for this. Pt verbalizes understanding.

## 2023-04-22 ENCOUNTER — Ambulatory Visit (HOSPITAL_COMMUNITY): Admission: RE | Admit: 2023-04-22 | Payer: BC Managed Care – PPO | Source: Ambulatory Visit

## 2023-04-28 ENCOUNTER — Telehealth: Payer: Self-pay

## 2023-04-28 ENCOUNTER — Other Ambulatory Visit: Payer: Self-pay | Admitting: Hematology and Oncology

## 2023-04-28 ENCOUNTER — Ambulatory Visit (HOSPITAL_COMMUNITY)
Admission: RE | Admit: 2023-04-28 | Discharge: 2023-04-28 | Disposition: A | Discharge status: Home / Self Care | Payer: BC Managed Care – PPO | Source: Ambulatory Visit | Attending: Hematology and Oncology | Admitting: Hematology and Oncology

## 2023-04-28 DIAGNOSIS — C7800 Secondary malignant neoplasm of unspecified lung: Secondary | ICD-10-CM | POA: Diagnosis present

## 2023-04-28 DIAGNOSIS — C801 Malignant (primary) neoplasm, unspecified: Secondary | ICD-10-CM | POA: Diagnosis not present

## 2023-04-28 DIAGNOSIS — J910 Malignant pleural effusion: Secondary | ICD-10-CM | POA: Diagnosis present

## 2023-04-28 NOTE — Telephone Encounter (Signed)
Called her and told her per Dr. Bertis Ruddy, chest xray looks good and thoracentesis canceled for tomorrow.

## 2023-04-28 NOTE — Telephone Encounter (Signed)
Called and moved thoracentesis appt on 8/2 to [facility] at 1:30 pm, arrive at 1 pm per her request. She is aware of appt.

## 2023-04-28 NOTE — Telephone Encounter (Signed)
Called back and left a message to go to [facility] on 8/1 for chest x-ray and 8/2 arrive at 0830 to [facility] for thoracentesis at 9 am. Ask her to call the office back for questions.

## 2023-04-29 ENCOUNTER — Ambulatory Visit (HOSPITAL_COMMUNITY): Payer: BC Managed Care – PPO

## 2023-05-03 IMAGING — US IR IMAGING GUIDED PORT INSERTION
1 series · 1 of 1 positions shown · non-contrast
Comparison: none

INDICATION: 54-year-old female with uterine leiomyosarcoma metastatic to the
lungs. She presents for port catheter placement to establish durable
venous access.

[Series 1: ir imaging guided port insertion · 1 of 1 slices shown]
[im 1/1]
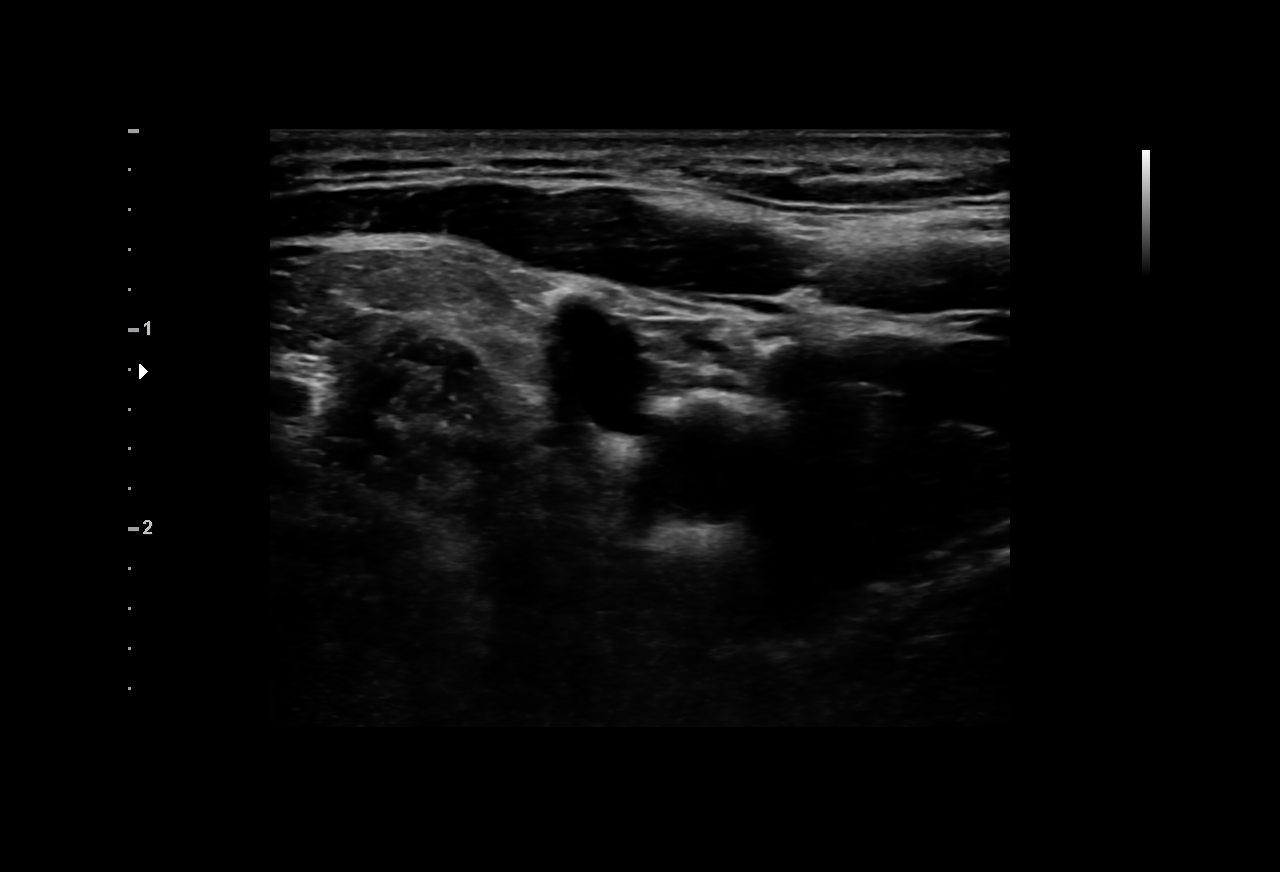

[1 of 1 positions shown; findings below may reference images not displayed]

EXAM:
IMPLANTED PORT A CATH PLACEMENT WITH ULTRASOUND AND FLUOROSCOPIC
GUIDANCE

MEDICATIONS:
None.

ANESTHESIA/SEDATION:
Versed 3 mg IV; Fentanyl 100 mcg IV;

Moderate Sedation Time:  28 minutes

The patient was continuously monitored during the procedure by the
interventional radiology nurse under my direct supervision.

FLUOROSCOPY TIME:  1 minutes, 12 seconds (2 mGy)

COMPLICATIONS:
None immediate.

PROCEDURE:
The right neck and chest was prepped with chlorhexidine, and draped
in the usual sterile fashion using maximum barrier technique (cap
and mask, sterile gown, sterile gloves, large sterile sheet, hand
hygiene and cutaneous antiseptic). Local anesthesia was attained by
infiltration with 1% lidocaine with epinephrine.

Ultrasound demonstrated patency of the right internal jugular vein,
and this was documented with an image. Under real-time ultrasound
guidance, this vein was accessed with a 21 gauge micropuncture
needle and image documentation was performed. A small dermatotomy
was made at the access site with an 11 scalpel. A 0.018" wire was
advanced into the SVC and the access needle exchanged for a 4F
micropuncture vascular sheath. The 0.018" wire was then removed and
a 0.035" wire advanced into the IVC.



The venous access site was then serially dilated and a peel away
vascular sheath placed over the wire. The wire was removed and the
port catheter advanced into position under fluoroscopic guidance.
The catheter tip is positioned in the superior cavoatrial junction.
This was documented with a spot image. The portacatheter was then
tested and found to flush and aspirate well. The port was flushed
with saline followed by 100 units/mL heparinized saline.

The pocket was then closed with subdermal inverted interrupted
absorbable sutures. The epidermis was then sealed with Dermabond.
The dermatotomy at the venous access site was also closed with
Dermabond.
IMPRESSION: Successful placement of a right IJ approach Power Port with
ultrasound and fluoroscopic guidance. The catheter is ready for use.

## 2023-05-12 ENCOUNTER — Telehealth: Payer: Self-pay

## 2023-05-12 ENCOUNTER — Ambulatory Visit (HOSPITAL_COMMUNITY)
Admission: RE | Admit: 2023-05-12 | Discharge: 2023-05-12 | Disposition: A | Discharge status: Home / Self Care | Payer: BC Managed Care – PPO | Source: Ambulatory Visit | Attending: Hematology and Oncology | Admitting: Hematology and Oncology

## 2023-05-12 DIAGNOSIS — J910 Malignant pleural effusion: Secondary | ICD-10-CM | POA: Diagnosis present

## 2023-05-12 NOTE — Telephone Encounter (Signed)
Called and instructed to keep thoracentesis tomorrow at [facility], on chest xray it showed some fluid. She will keep scheduled appt for thoracentesis tomorrow. She is scheduled for CT tomorrow at [facility]. She is scheduled for virtual visit next week with Dr. Meredith Mody to get results of CT and her next treatment is scheduled for next week.

## 2023-05-13 ENCOUNTER — Other Ambulatory Visit (HOSPITAL_COMMUNITY): Payer: BC Managed Care – PPO

## 2023-05-13 ENCOUNTER — Ambulatory Visit (HOSPITAL_COMMUNITY)
Admission: RE | Admit: 2023-05-13 | Discharge: 2023-05-13 | Disposition: A | Discharge status: Home / Self Care | Payer: BC Managed Care – PPO | Source: Ambulatory Visit | Attending: Hematology and Oncology | Admitting: Hematology and Oncology

## 2023-05-13 ENCOUNTER — Ambulatory Visit (HOSPITAL_COMMUNITY)
Admission: RE | Admit: 2023-05-13 | Discharge: 2023-05-13 | Disposition: A | Discharge status: Home / Self Care | Payer: BC Managed Care – PPO | Source: Ambulatory Visit | Attending: Radiology | Admitting: Radiology

## 2023-05-13 VITALS — BP 103/68

## 2023-05-13 DIAGNOSIS — C7802 Secondary malignant neoplasm of left lung: Secondary | ICD-10-CM | POA: Insufficient documentation

## 2023-05-13 DIAGNOSIS — C7801 Secondary malignant neoplasm of right lung: Secondary | ICD-10-CM | POA: Insufficient documentation

## 2023-05-13 DIAGNOSIS — C499 Malignant neoplasm of connective and soft tissue, unspecified: Secondary | ICD-10-CM | POA: Diagnosis not present

## 2023-05-13 DIAGNOSIS — C7800 Secondary malignant neoplasm of unspecified lung: Secondary | ICD-10-CM | POA: Diagnosis present

## 2023-05-13 DIAGNOSIS — C799 Secondary malignant neoplasm of unspecified site: Secondary | ICD-10-CM

## 2023-05-13 DIAGNOSIS — J910 Malignant pleural effusion: Secondary | ICD-10-CM | POA: Diagnosis present

## 2023-05-13 MED ORDER — LIDOCAINE HCL 1 % IJ SOLN
INTRAMUSCULAR | Status: AC
  Filled 2023-05-13: qty 20

## 2023-05-20 ENCOUNTER — Encounter: Payer: Self-pay | Admitting: Hematology and Oncology

## 2023-05-23 ENCOUNTER — Telehealth: Payer: Self-pay

## 2023-05-23 ENCOUNTER — Inpatient Hospital Stay: Admission: RE | Admit: 2023-05-23 | Payer: BC Managed Care – PPO | Source: Ambulatory Visit

## 2023-05-23 NOTE — Telephone Encounter (Signed)
Returned her. She started having shortness of breath yesterday. She is asking if Dr. Bertis Ruddy can order thoracentesis? Last one was 8/2. She recently had CT at [facility] on 8/2.

## 2023-05-24 ENCOUNTER — Ambulatory Visit (HOSPITAL_COMMUNITY)
Admission: RE | Admit: 2023-05-24 | Discharge: 2023-05-24 | Disposition: A | Discharge status: Home / Self Care | Payer: BC Managed Care – PPO | Source: Ambulatory Visit | Attending: Physician Assistant | Admitting: Physician Assistant

## 2023-05-24 ENCOUNTER — Ambulatory Visit (HOSPITAL_COMMUNITY)
Admission: RE | Admit: 2023-05-24 | Discharge: 2023-05-24 | Disposition: A | Discharge status: Home / Self Care | Payer: BC Managed Care – PPO | Source: Ambulatory Visit | Attending: Hematology and Oncology | Admitting: Hematology and Oncology

## 2023-05-24 ENCOUNTER — Encounter: Payer: Self-pay | Admitting: Oncology

## 2023-05-24 VITALS — BP 111/68

## 2023-05-24 DIAGNOSIS — J910 Malignant pleural effusion: Secondary | ICD-10-CM

## 2023-05-24 DIAGNOSIS — C7800 Secondary malignant neoplasm of unspecified lung: Secondary | ICD-10-CM

## 2023-05-24 DIAGNOSIS — J90 Pleural effusion, not elsewhere classified: Secondary | ICD-10-CM

## 2023-05-24 HISTORY — PX: IR THORACENTESIS ASP PLEURAL SPACE W/IMG GUIDE: IMG5380

## 2023-05-24 MED ORDER — LIDOCAINE HCL 1 % IJ SOLN
INTRAMUSCULAR | Status: AC
  Filled 2023-05-24: qty 20

## 2023-05-24 NOTE — Telephone Encounter (Signed)
Hi karen, please arrange for thoracentesis

## 2023-05-24 NOTE — Procedures (Signed)
PROCEDURE SUMMARY:  Successful image-guided right thoracentesis. Yielded 700 milliliters of clear yellow fluid. Patient tolerated procedure well. EBL < 1 mL No immediate complications.  Specimen was not sent for labs. Post procedure CXR shows no pneumothorax.  Please see imaging section of Epic for full dictation.  Villa Herb PA-C 05/24/2023 2:49 PM

## 2023-05-24 NOTE — Telephone Encounter (Signed)
Tammie Gilmore with a thoracentesis appointment for today at 2:00 (1:30 arrival) at [facility].  She verbalized understanding and agreement of appointment.  Also discussed her current treatment plan with Dr. Meredith Mody.  She said she is continuing treatment with Keytruda and trabectedin for 2 more cycles.  She is getting her infusions at [facility] with the last on 05/20/2023 and the next on 06/08/2023. Her next appointment with Dr. Meredith Mody is on 06/14/2023.

## 2023-05-26 ENCOUNTER — Encounter: Payer: Self-pay | Admitting: Hematology and Oncology

## 2023-05-30 ENCOUNTER — Telehealth: Payer: Self-pay

## 2023-05-30 ENCOUNTER — Other Ambulatory Visit: Payer: Self-pay | Admitting: Hematology and Oncology

## 2023-05-30 ENCOUNTER — Inpatient Hospital Stay: Payer: BC Managed Care – PPO

## 2023-05-30 DIAGNOSIS — C7800 Secondary malignant neoplasm of unspecified lung: Secondary | ICD-10-CM

## 2023-05-30 NOTE — Telephone Encounter (Signed)
Returned her call. She started having shortness of breath on 8/15 or 8/16, she is not sure of exact date. Last thoracentesis 8/13. On 8/15 at treatment at [facility] she got some extra IV fluids.  She is requesting thoracentesis. Next chemo treatment at [facility] is scheduled on 8/28.  Instructed to go to the ER for worsening symptoms. She verbalized understanding.

## 2023-05-30 NOTE — Telephone Encounter (Signed)
I placed order for urgent thoracentesis Please schedule

## 2023-05-30 NOTE — Telephone Encounter (Signed)
Called and given appt for thoracentesis at [facility] for tomorrow at 11 am, arrive at 1030 to [facility] for appt. Cory verbalized understanding and will go the the ER for worsening symptoms.

## 2023-05-31 ENCOUNTER — Ambulatory Visit (HOSPITAL_COMMUNITY)
Admission: RE | Admit: 2023-05-31 | Discharge: 2023-05-31 | Disposition: A | Discharge status: Home / Self Care | Payer: BC Managed Care – PPO | Source: Ambulatory Visit | Attending: Radiology | Admitting: Radiology

## 2023-05-31 ENCOUNTER — Telehealth: Payer: Self-pay

## 2023-05-31 ENCOUNTER — Ambulatory Visit (HOSPITAL_COMMUNITY)
Admission: RE | Admit: 2023-05-31 | Discharge: 2023-05-31 | Disposition: A | Discharge status: Home / Self Care | Payer: BC Managed Care – PPO | Source: Ambulatory Visit | Attending: Hematology and Oncology | Admitting: Hematology and Oncology

## 2023-05-31 DIAGNOSIS — C7800 Secondary malignant neoplasm of unspecified lung: Secondary | ICD-10-CM

## 2023-05-31 DIAGNOSIS — J90 Pleural effusion, not elsewhere classified: Secondary | ICD-10-CM | POA: Insufficient documentation

## 2023-05-31 NOTE — Telephone Encounter (Signed)
Called regarding future thoracentesis and ask her to call the office no later than Wednesday every week as needed if she feels SOB and needs thoracentesis. It is hard to predict when she will need thoracentesis, per Dr. Bertis Ruddy.

## 2023-05-31 NOTE — Procedures (Addendum)
Ultrasound-guided  therapeutic right thoracentesis performed yielding 820 cc  of yellow fluid. No immediate complications. Follow-up chest x-ray pending.EBL none.

## 2023-06-14 ENCOUNTER — Other Ambulatory Visit: Payer: Self-pay | Admitting: Hematology and Oncology

## 2023-06-14 ENCOUNTER — Telehealth: Payer: Self-pay

## 2023-06-14 DIAGNOSIS — J910 Malignant pleural effusion: Secondary | ICD-10-CM

## 2023-06-14 DIAGNOSIS — C55 Malignant neoplasm of uterus, part unspecified: Secondary | ICD-10-CM

## 2023-06-14 NOTE — Telephone Encounter (Signed)
I placed STAT order Please call to schedule

## 2023-06-14 NOTE — Telephone Encounter (Signed)
Called and given appt for thoracentesis for tomorrow at [facility], arrive at 1015 for 1030 appt. Instructed to got the ER for worsening symptoms. She verbalized understanding.

## 2023-06-14 NOTE — Telephone Encounter (Signed)
Returned her call. She needs a thoracentesis due to shortness of breath. She left a message yesterday requesting one as soon as possible.

## 2023-06-15 ENCOUNTER — Ambulatory Visit (HOSPITAL_COMMUNITY)
Admission: RE | Admit: 2023-06-15 | Discharge: 2023-06-15 | Disposition: A | Discharge status: Home / Self Care | Payer: BC Managed Care – PPO | Source: Ambulatory Visit | Attending: Radiology | Admitting: Radiology

## 2023-06-15 ENCOUNTER — Ambulatory Visit (HOSPITAL_COMMUNITY)
Admission: RE | Admit: 2023-06-15 | Discharge: 2023-06-15 | Disposition: A | Discharge status: Home / Self Care | Payer: BC Managed Care – PPO | Source: Ambulatory Visit | Attending: Hematology and Oncology | Admitting: Hematology and Oncology

## 2023-06-15 VITALS — BP 94/61

## 2023-06-15 DIAGNOSIS — Z9889 Other specified postprocedural states: Secondary | ICD-10-CM

## 2023-06-15 DIAGNOSIS — J910 Malignant pleural effusion: Secondary | ICD-10-CM | POA: Diagnosis present

## 2023-06-15 DIAGNOSIS — C55 Malignant neoplasm of uterus, part unspecified: Secondary | ICD-10-CM | POA: Diagnosis present

## 2023-06-15 MED ORDER — LIDOCAINE HCL 1 % IJ SOLN
INTRAMUSCULAR | Status: AC
  Filled 2023-06-15: qty 20

## 2023-06-15 NOTE — Procedures (Signed)
PROCEDURE SUMMARY:  Successful US guided right thoracentesis. Yielded 800 mL of clear yellow fluid. Pt tolerated procedure well. No immediate complications.  CXR ordered.  EBL < 5 mL  Brayton El PA-C 06/15/2023 10:43 AM

## 2023-06-20 ENCOUNTER — Encounter (HOSPITAL_COMMUNITY): Payer: Self-pay

## 2023-06-21 ENCOUNTER — Inpatient Hospital Stay
Admission: RE | Admit: 2023-06-21 | Discharge: 2023-06-21 | Disposition: A | Discharge status: Home / Self Care | Payer: Self-pay | Source: Ambulatory Visit | Attending: Radiation Oncology | Admitting: Radiation Oncology

## 2023-06-21 ENCOUNTER — Other Ambulatory Visit: Payer: Self-pay | Admitting: Radiation Therapy

## 2023-06-21 ENCOUNTER — Encounter: Payer: Self-pay | Admitting: Hematology and Oncology

## 2023-06-21 DIAGNOSIS — C7951 Secondary malignant neoplasm of bone: Secondary | ICD-10-CM

## 2023-07-08 ENCOUNTER — Other Ambulatory Visit: Payer: Self-pay | Admitting: Hematology and Oncology

## 2023-07-08 ENCOUNTER — Other Ambulatory Visit: Payer: Self-pay | Admitting: Radiation Therapy

## 2023-07-08 DIAGNOSIS — C7931 Secondary malignant neoplasm of brain: Secondary | ICD-10-CM

## 2023-07-12 DEATH — deceased

## 2023-07-15 ENCOUNTER — Telehealth: Payer: Self-pay | Admitting: Nurse Practitioner

## 2023-07-25 NOTE — Progress Notes (Deleted)
Palliative Medicine [facility]  Telephone:([phone] Fax:([phone]) [phone]   Name: Tammie Gilmore Date: 07/25/2023 MRN: [id number]  DOB: September 29, 1966  Patient Care Team: Juliette Alcide, MD as PCP - General (Family Medicine)    REASON FOR CONSULTATION: Tammie Gilmore is a 57 y.o. 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 Palliative ask to see for symptom management and goals of care.    SOCIAL HISTORY:     reports that she has never smoked. She has never used smokeless tobacco. She reports that she does not drink alcohol and does not use drugs.  ADVANCE DIRECTIVES:  None on file  CODE STATUS: Full code  PAST MEDICAL HISTORY: Past Medical History:  Diagnosis Date   History of radiation therapy    Lumbar Spine- 03/10/22-03/23/22- Dr. Antony Blackbird   History of radiation therapy    SBRT to iliac nodes; 09/14/22-09/24/22 Dr. Antony Blackbird   Hypoglycemia    occasional episodes of hypoglycemia   IBS (irritable bowel syndrome)    Pathologic fracture of lumbar vertebra 02/04/2022    PAST SURGICAL HISTORY:  Past Surgical History:  Procedure Laterality Date   HERNIA REPAIR  1994   left inguinal, with mesh   IR BONE TUMOR(S)RF ABLATION  08/26/2022   IR IMAGING GUIDED PORT INSERTION  08/13/2021   IR KYPHO LUMBAR INC FX REDUCE BONE BX UNI/BIL CANNULATION INC/IMAGING  08/26/2022   IR RADIOLOGIST EVAL & MGMT  08/03/2022   IR RADIOLOGIST EVAL & MGMT  09/09/2022   IR THORACENTESIS ASP PLEURAL SPACE W/IMG GUIDE  05/24/2023    HEMATOLOGY/ONCOLOGY HISTORY:  Oncology History  Uterine leiomyosarcoma (HCC)  06/11/2021 Imaging   1. 9.5 x 7.6 x 9.0 cm complex, partially necrotic, mass involving the lower uterine segment/ cervix. No obvious direct extension into the parametrium.  2. 9 mm  left pelvic sidewall lymph node is partially necrotic and worrisome for metastatic adenopathy.  3. No findings for abdominal omental or peritoneal surface disease or adenopathy.  4. Tiny low-attenuation lesion in the pancreatic head, likely benign cyst but attention on follow-up scans is suggested.  5. 2.9 cm fundal fibroid.    06/19/2021 Pathology Results   FINAL MICROSCOPIC DIAGNOSIS:   A. UTERINE, CERVICAL MASS, BIOPSY:  - Spindle cell malignancy.  - See comment.   COMMENT:  The biopsies consist of endocervical mucosa with stromal edema and one biopsy fragment has a microscopic focus with atypical spindle cells consistent with poorly differentiated malignancy.  The differential  includes a spindle cell malignancy such as sarcomatoid carcinoma and leiomyosarcoma.  Mullerian adenosarcoma is also a consideration but considered less likely   06/23/2021 Imaging   MR pelvis  10 cm uterine mass with central necrosis, which is centered in the cervix and lower uterine segment. Right parametrial involvement is seen as well as suspected invasion of the distal rectum. Differential diagnosis includes cervical carcinoma and uterine leiomyosarcoma.   Mild bilateral iliac lymphadenopathy, highly suspicious for metastatic disease.   2.9 cm subserosal fibroid in the posterior fundus.   Normal appearance of both ovaries.     06/29/2021 PET scan   1. Hypermetabolic necrotic cervical/uterine mass with bilateral external iliac hypermetabolic lymph nodes. No evidence of distant metastatic disease. 2. 1.5 cm low-attenuation left thyroid nodule. Recommend thyroid ultrasound. (Ref: J Am Coll Radiol. 2015 Feb;12(2): 143-50).   07/17/2021 Pathology  Results   A: Uterus with cervix and bilateral ovaries and fallopian tubes, radical hysterectomy and bilateral salpingo-oophorectomy - Leiomyosarcoma, high grade (grade 3 / 3) with extensive epithelioid, pleomorphic, and myxoid areas and associated necrosis (~20%) -  Tumor based in cervix and also involves lower uterine segment - Cervicovaginal margin involved by focal invasive leiomyosarcoma (3:00-5:00, A10) as well as tumor in lymphovascular spaces - Leiomyosarcoma involves right and left parametrial tissue and extends to parametrial margins - Extensive lymphovascular space invasion present, including in uterus and parametria - See synoptic report and comment   Other findings: - Leiomyomata with hyalinization, size up to 3.0 cm - Ovaries and fallopian tubes with no parenchymal involvement by leiomyosarcoma identified, although adnexal lymphovascular space invasion is present   B: Lymph nodes, right pelvic, lymphadenectomy - One of four lymph nodes positive for metastatic leiomyosarcoma (1/4), with extracapsular extension present   C: Lymph nodes, left pelvic, lymphadenectomy - One of four lymph nodes positive for metastatic leiomyosarcoma (1/4), with extracapsular extension present  Immunohistochemical stains are performed on block A11, and demonstrate that the tumor is positive for desmin and CD10, with SMA staining the majority of the spindle cell component but largely negative in the epithelioid / pleomorphic component. OSCAR, pancytokeratin AE1/AE3, HMB45, and PR appear negative in the tumor. ER shows patchy weak staining and myogenin stains rare cells. Block A23 also shows positive desmin and negative OSCAR pancytokeratin. Overall, the findings are most consistent with leiomyosarcoma, with extensive areas that are myxoid, epithelioid, and pleomorphic as well as more typical spindle cell areas within the overall high grade tumor (grade 3 / 3). The tumor is staged as pT2b (involves other pelvic tissues) given the parametrial involvement and pN1 for FIGO stage IIIC.    07/17/2021 Surgery   Date of Surgery: 07/17/21  Preoperative Diagnosis: High Grade Uterine Sarcoma  Postoperative Diagnosis: Same  Procedure(s): Bilateral - RADICAL ABDOMINAL HYSTER,  W/BIL TOTAL PELVIC LYMPHADENECTOMY & PARA-AORTIC LYMPH NODE BX W/WO REM TUBE/OVAR VAGINAL HYSTERECTOMY, FOR UTERUS 250 G OR LESS; WITH REPAIR OF ENTEROCELE COLECTOMY, PARTIAL; WITH COLOPROCTOSTOMY (LOW PELVIC ANASTOMOSIS) WITH COLOSTOMY CYSTOURETHROSCOPY, WITH INSERTION OF INDWELLING URETERAL STENT (EG, GIBBONS OR DOUBLE-J TYPE) - Cystourethroscopy - Bilateral ureteral stent placement - Foley catheter placement  Performing Service: Gynecology Oncology Surgeon(s) and Role: Panel 1: * Carver Fila, MD - Primary * Clide Cliff, MD - Resident - Assisting * Irene Pap, MD - Resident - Assisting Panel 2: * Annye English, MD - Primary  Drains:  - Left 6Fr open-ended ureteral access catheter (green) - Right 5Fr open-ended ureteral access catheter (white) - 16Fr foley catheter to drainage  * No implants in log *  Indications: 57 y.o. female with high grade uterine sarcoma. Urology was consulted pre-operatively for placement of bilateral ureteral stents. Risks, benefits, and alternatives of the above procedure were discussed and informed consent was signed.  OperativeFindings:  - Grossly distorted architecture of urinary bladder likely 2/2 pelvic mass with anterolaterally positioned UOs - Successful placement of bilateral open-ended ureteral catheters under direct visualization - Foley catheter placed at case conclusion  Description: The patient was correctly identified in the preop holding area where written informed consent as well potential risk and complication reviewed. She agreed. The patient was brought to the operative suite where a preinduction timeout was performed. Once correct information was verified, general anesthesia was induced. The patient was then gently placed into dorsal lithotomy position with SCDs in place for VTE prophylaxis. They were prepped and  draped in the usual sterile fashion and given appropriate preoperative antibiotics. A second timeout  was then performed.   We inserted a 68F rigid cystoscope per urethra with copious lubrication and normal saline irrigation running. We performed cystourethroscopy, which revealed the above findings.  We turned our attention to the left ureteral orifice and canulated it with a sensor wire, using assistance of a 6Fr open-ended catheter. The wire was advanced into the renal pelvis without difficulty under visual guidance. We then advanced the stent over our wire into the renal pelvis under direct visualization and feel without complication. The wire was subsequently removed.   We then turned our attention to the right ureteral orifice and canulated it with a sensor wire, using assistance of a 5Fr open-ended catheter. The wire was advanced into the renal pelvis without difficulty under visual guidance. We then advanced the stent over our wire into the renal pelvis under direct visualization and feel without complication. The wire was subsequently removed.   A 16Fr straight catheter was placed, with return of urine indicating appropriate position within the bladder. The balloon was inflated with 10cc sterile water. The stents were secured to the Foley using 0-silk ties, being careful not to occlude the stents or Foley.   The patient was awoken from general anesthesia having tolerated the procedure well and taken to the PACU for routine post-operative recovery.  Post-Op Plan:  - Foley and stents per primary team    07/17/2021 Surgery   Date of Surgery: 07/17/2021  Pre-op Diagnosis: Uterine spindle cell malignancy  Post-op Diagnosis: Same  Procedure(s): Panel 1 RADICAL ABDOMINAL HYSTER, with bilateral S&O, vagineconty upper, bilateral pelvic lyphadenectomy, bilateral ureterolysis,: 58210 (CPT) Panel 2 CYSTOURETHROSCOPY, WITH INSERTION OF INDWELLING URETERAL STENT (EG, GIBBONS OR DOUBLE-J TYPE): 52332 (CPT) Note: Revisions to procedures should be made in chart - see Procedures  activity.  Performing Service: Gynecology Oncology Surgeon(s) and Role: Panel 1: * Carver Fila, MD - Primary * Clide Cliff, MD - Resident - Assisting * Irene Pap, MD - Resident - Assisting Panel 2: * Annye English, MD - Primary  Findings: On bimanual exam, 10cm necrotic mass filling upper vagina, unable to discretely palpate the cervix. On rectovaginal exam, rectal involvement not identified. Intraoperatively, normal upper abdominal survey including normal liver, diaphragm, stomach, omentum and bowel. Small uterus with 10cm mass expanding the cervix. Palpably enlarged bilateral pelvic lymph nodes adherent to the external iliac veins and obturator nerves, removed. No palpable para-aortic lymphadenopathy. No rectal involvement of uterine mass.   Specimens:  ID Type Source Tests Collected by Time Destination  1 : uterus,cervix,bilateral tubes/ovaries Tissue Uterus SURGICAL PATHOLOGY EXAM Carver Fila, MD 07/17/2021 0932  2 : right pelvic lymph node Tissue Lymph Node SURGICAL PATHOLOGY EXAM Carver Fila, MD 07/17/2021 1125  3 : LEFT PELVIC LN Tissue Lymph Node SURGICAL PATHOLOGY EXAM Carver Fila, MD 07/17/2021 1144    08/06/2021 Initial Diagnosis   Uterine leiomyosarcoma (HCC)   08/06/2021 Cancer Staging   Staging form: Corpus Uteri - Leiomyosarcoma and Endometrial Stromal Sarcoma, AJCC 8th Edition - Pathologic stage from 08/06/2021: FIGO Stage IVB (pT3, pN1, cM1) - Signed by Artis Delay, MD on 08/11/2021 Stage prefix: Initial diagnosis   08/10/2021 Imaging   CT abdomen and pelvis 1. Interval development of left lobe pulmonary nodules, measuring up to 7 mm and highly for metastatic disease. 2. Interval development of small to upper normal lymph nodes in the pelvis, concerning for metastatic disease. 3. Postoperative seroma  left pelvic sidewall. 4. Tiny cluster of tree-in-bud opacity in the peripheral right lower lobe is new and compatible with  sequelae of atypical infection.   08/13/2021 Procedure   Procedure: Placement of a right IJ approach single lumen PowerPort.  Tip is positioned at the superior cavoatrial junction and catheter is ready for immediate use.  Complications: No immediate   08/14/2021 Echocardiogram    1. Left ventricular ejection fraction, by estimation, is 60 to 65%. The left ventricle has normal function. The left ventricle has no regional wall motion abnormalities. Left ventricular diastolic parameters were normal. The average left ventricular global longitudinal strain is -17.4 %. The global longitudinal strain is normal.  2. Right ventricular systolic function is normal. The right ventricular size is normal.  3. The mitral valve is normal in structure. No evidence of mitral valve regurgitation. No evidence of mitral stenosis.  4. The aortic valve is tricuspid. Aortic valve regurgitation is not visualized. No aortic stenosis is present.  5. The inferior vena cava is normal in size with greater than 50% respiratory variability, suggesting right atrial pressure of 3 mmHg.     08/17/2021 Imaging   Multiple new and enlarging pulmonary nodules scattered throughout the lungs bilaterally, highly concerning for progressive metastatic disease to the lungs   08/18/2021 - 11/10/2021 Chemotherapy   Patient is on Treatment Plan : UTERINE LEIOMYOSARCOMA Doxorubicin q21d x 6 Cycles     08/18/2021 - 08/12/2022 Chemotherapy   Patient is on Treatment Plan : UTERINE UNDIFFERENTIATED LEIOMYOSARCOMA Gemcitabine D1,8 + Docetaxel D8 (900/100) q21d     11/09/2021 Imaging   IMPRESSION: 1. Multiple small bilateral pulmonary nodules, some of which are slightly increased in size. Other nodules unchanged. 2. Interval decrease in size of left pelvic sidewall lymph nodes. 3. Unchanged size of perirectal lymph nodes or soft tissue nodules. These however demonstrate new internal hypodensity, suggesting treatment response and internal necrosis. 4.  Unchanged left iliac lymph node or peritoneal nodule. 5. Findings are consistent with mixed response to treatment. No evidence of new metastatic disease in the chest, abdomen, or pelvis. 6. Wall thickening and mucosal hyperenhancement of the bladder, consistent with nonspecific infectious or inflammatory cystitis. Correlate with urinalysis. 7. Status post hysterectomy and oophorectomy. Interval resolution of a previously noted left pelvic hematoma or seroma. 8. Trace, nonspecific free fluid in the low pelvis.   11/30/2021 Echocardiogram    1. Left ventricular ejection fraction, by estimation, is 40 to 45%. Left ventricular ejection fraction by 3D volume is 41 %. The left ventricle has mildly decreased function. The left ventricle has no regional wall motion abnormalities. Left ventricular  diastolic parameters are consistent with Grade I diastolic dysfunction (impaired relaxation).  2. Right ventricular systolic function is moderately reduced. The right ventricular size is normal.  3. The mitral valve is grossly normal. No evidence of mitral valve regurgitation.  4. The aortic valve is normal in structure. Aortic valve regurgitation is not visualized. No aortic stenosis is present.     12/07/2021 - 05/31/2022 Chemotherapy   Patient is on Treatment Plan : UTERINE UNDIFFERENTIATED / LEIOMYOSARCOMA Gemcitabine D1,8 + Docetaxel D8 (900/100) q21d     02/05/2022 Imaging   Pathologic burst fracture of L3 due to a metastatic lesion, with probable mild degree of right-sided extraosseous tumor extension in the paraspinal soft tissues, and mild involvement of the right pedicle. No epidural/spinal canal involvement.   Multilevel degenerative disc disease without any significant stenosis in the lumbar spine.   05/31/2022 Imaging   1.  Signs of pelvic resolution of pelvic sidewall nodal disease/soft tissue near the LEFT vaginal apex. 2. Decreased conspicuity of RIGHT lower lobe pulmonary nodule and stable LEFT  apical pulmonary nodule. 3. Unchanged appearance of pathologic fracture at L3. 4. Urinary bladder wall thickening, slightly improved posteriorly, anterior urinary bladder may show some residual diffuse thickening. Continued correlation with signs of cystitis is suggested.     08/24/2022 Imaging   1. Interval growth of a solitary right external iliac nodal metastasis. No additional sites of new or progressive metastatic disease. 2. Small bilateral upper lobe pulmonary nodules are stable to mildly decreased. 3. Chronic incompletely healed pathologic L3 vertebral fracture with underlying lytic metastasis, not appreciably changed. No new focal osseous lesions. 4. New trace dependent bilateral pleural effusions. 5.  Aortic Atherosclerosis (ICD10-I70.0).     08/27/2022 Procedure   1. Successful L3 Osteocool RFA ablation. 2. Successful L3 kyphoplasty.   10/14/2022 Imaging   Focal soft tissue swelling along the occipital region seen on lateral view may correspond to palpable area of clinical concern. Lytic calvarial lesion underlying this area measures 3.2 cm in craniocaudal dimension, suspicious for osseous metastasis in the setting of known malignancy. Recommend contrast-enhanced MRI or CT for further evaluation.   10/21/2022 Imaging   MRI brain 1. 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 2. 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   10/26/2022 Imaging   1. Multiple new and enlarged bilateral pulmonary nodules, consistent with worsened pulmonary metastatic disease. 2. Interval enlargement of a necrotic appearing right external iliac lymph node, consistent with worsened nodal metastatic disease. 3. Multiple new small peritoneal nodules, consistent with peritoneal metastatic disease. 4. Trace ascites in the low pelvis, presumed malignant. 5. Interval vertebral cement augmentation of a pathologic  fracture of L3. 6. Status post hysterectomy.   10/27/2022 Imaging   Bone scan  1. 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 2. No additional foci of abnormal increased uptake suggestive of metastatic disease.   11/05/2022 Echocardiogram   1. Left ventricular ejection fraction, by estimation, is 60 to 65%. The left ventricle has normal function. The left ventricle has no regional wall motion abnormalities. Left ventricular diastolic parameters were normal. The average left ventricular global longitudinal strain is -15.0 %. The global longitudinal strain is abnormal.  2. Right ventricular systolic function is normal. The right ventricular size is normal. Tricuspid regurgitation signal is inadequate for assessing PA pressure.  3. No evidence of mitral valve regurgitation.  4. The aortic valve is grossly normal. Aortic valve regurgitation is not visualized.  5. The inferior vena cava is normal in size with greater than 50% respiratory variability, suggesting right atrial pressure of 3 mmHg.   11/24/2022 - 02/03/2023 Chemotherapy   Patient is on Treatment Plan : UTERINE UNDIFFERENTIATED LEIOMYOSARCOMA Gemcitabine D1,8 + Docetaxel D8 (900/100) q21d     02/15/2023 Imaging   CT CHEST ABDOMEN PELVIS W CONTRAST  Result Date: 02/14/2023 CLINICAL DATA:  Cervical cancer and uterine leiomyosarcoma on chemotherapy. Restaging. * Tracking Code: BO * EXAM: CT CHEST, ABDOMEN, AND PELVIS WITH CONTRAST TECHNIQUE: Multidetector CT imaging of the chest, abdomen and pelvis was performed following the standard protocol during bolus administration of intravenous contrast. RADIATION DOSE REDUCTION: This exam was performed according to the departmental dose-optimization program which includes automated exposure control, adjustment of the mA and/or kV according to patient size  and/or use of iterative reconstruction technique. CONTRAST:   OMNIPAQUE IOHEXOL 300 MG/ML  SOLN COMPARISON:  10/22/2022 CT chest, abdomen and pelvis. FINDINGS: CT CHEST FINDINGS Cardiovascular: Normal heart size. No significant pericardial effusion/thickening. Right internal jugular Port-A-Cath terminates at the cavoatrial junction. Great vessels are normal in course and caliber. No central pulmonary emboli. Mediastinum/Nodes: Stable small bilateral thyroid nodules, largest 1.0 cm in the inferior left thyroid lobe. Not clinically significant; no follow-up imaging recommended (ref: J Am Coll Radiol. 2015 Feb;12(2): 143-50). Unremarkable esophagus. No axillary adenopathy. No pathologically enlarged mediastinal or hilar nodes. Lungs/Pleura: No pneumothorax. Small posterior right pleural effusion is new. No left pleural effusion. Numerous (at least 10) solid pulmonary nodules scattered throughout both lungs, all substantially increased. Representative 2.7 x 2.3 cm medial right upper lobe nodule (series 6/image 40), previously 1.3 x 1.0 cm. Representative 1.1 x 0.8 cm superior segment left lower lobe nodule (series 6/image 58), new. Representative 1.3 cm left lower lobe nodule (series 6/image 86), previously 0.2 cm. Mild passive atelectasis at the right lung base. Musculoskeletal:  No aggressive appearing focal osseous lesions. CT ABDOMEN PELVIS FINDINGS Hepatobiliary: Small hypodense 0.8 cm lesion along the posterior right liver capsule (series 2/image 58), new. Otherwise normal liver. Normal gallbladder with no radiopaque cholelithiasis. No biliary ductal dilatation. Pancreas: Normal, with no mass or duct dilation. Spleen: Normal size. No mass. Adrenals/Urinary Tract: Normal adrenals. Normal kidneys with no hydronephrosis and no renal mass. Normal bladder. Stomach/Bowel: Normal non-distended stomach. Normal caliber small bowel with no small bowel wall thickening. Appendix not discretely visualized. Normal large bowel with no diverticulosis, large bowel wall thickening or  pericolonic fat stranding. Vascular/Lymphatic: Atherosclerotic nonaneurysmal abdominal aorta. Patent portal, splenic, hepatic and renal veins. Infiltrative 2.3 cm short axis diameter right external iliac lymph node (series 2/image 97), increased from 1.6 cm. No additional pathologically enlarged lymph nodes in the abdomen or pelvis. Reproductive: Hysterectomy. Other: No pneumoperitoneum. Small volume ascites is increased. Innumerable bulky peritoneal masses and nodules scattered throughout the peritoneal cavity, substantially increased. Representative 7.3 x 5.1 cm irregular anterior midline peritoneal mass (series 2/image 82), probably correlating to the previous 1.7 x 1.2 cm nodule. Right omental 4.7 x 2.9 cm peritoneal mass (series 2/image 83), previously 1.0 x 0.9 cm. High left upper quadrant anterior peritoneal 1.9 x 1.6 cm mass (series 2/image 56), new. Left deep pelvic 2.8 x 2.4 cm peritoneal mass (series 2/image 110), previously 0.7 x 0.6 cm. Musculoskeletal: Chronic mild L3 vertebral pathologic fracture status post vertebroplasty. No new focal osseous lesions. Mild lumbar spondylosis. IMPRESSION: 1. Substantial interval progression of widespread bilateral pulmonary metastases. 2. Substantial interval progression of widespread bulky peritoneal carcinomatosis. Small volume malignant ascites is increased. 3. Infiltrative 2.3 cm short axis diameter right external iliac nodal metastasis, increased. 4. Small posterior right pleural effusion, new. 5.  Aortic Atherosclerosis (ICD10-I70.0). Electronically Signed   By: Delbert Phenix M.D.   On: 02/14/2023 10:12      05/13/2023 Procedure   Successful ultrasound guided therapeutic right-sided thoracentesis yielding 700 mL of pleural fluid.   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(HCC)  08/11/2021 Initial Diagnosis   Pulmonary metastases (HCC)   08/18/2021 - 11/10/2021 Chemotherapy   Patient is on Treatment Plan : UTERINE LEIOMYOSARCOMA Doxorubicin q21d x 6 Cycles      08/18/2021 - 08/12/2022 Chemotherapy   Patient is on Treatment Plan : UTERINE UNDIFFERENTIATED LEIOMYOSARCOMA Gemcitabine D1,8 + Docetaxel D8 (900/100) q21d     11/09/2021 Imaging   IMPRESSION: 1. Multiple small bilateral pulmonary  nodules, some of which are slightly increased in size. Other nodules unchanged. 2. Interval decrease in size of left pelvic sidewall lymph nodes. 3. Unchanged size of perirectal lymph nodes or soft tissue nodules. These however demonstrate new internal hypodensity, suggesting treatment response and internal necrosis. 4. Unchanged left iliac lymph node or peritoneal nodule. 5. Findings are consistent with mixed response to treatment. No evidence of new metastatic disease in the chest, abdomen, or pelvis. 6. Wall thickening and mucosal hyperenhancement of the bladder, consistent with nonspecific infectious or inflammatory cystitis. Correlate with urinalysis. 7. Status post hysterectomy and oophorectomy. Interval resolution of a previously noted left pelvic hematoma or seroma. 8. Trace, nonspecific free fluid in the low pelvis.   12/07/2021 - 05/31/2022 Chemotherapy   Patient is on Treatment Plan : UTERINE UNDIFFERENTIATED / LEIOMYOSARCOMA Gemcitabine D1,8 + Docetaxel D8 (900/100) q21d     11/24/2022 - 02/03/2023 Chemotherapy   Patient is on Treatment Plan : UTERINE UNDIFFERENTIATED LEIOMYOSARCOMA Gemcitabine D1,8 + Docetaxel D8 (900/100) q21d       ALLERGIES:  is allergic to doxycycline.  MEDICATIONS:  Current Outpatient Medications  Medication Sig Dispense Refill   acetaminophen (TYLENOL) 500 MG tablet Take 1,000 mg by mouth every 6 (six) hours as needed for moderate pain or headache.     albuterol (VENTOLIN HFA) 108 (90 Base) MCG/ACT inhaler Inhale 2 puffs into the lungs every 4 (four) hours as needed for wheezing or shortness of breath.     apixaban (ELIQUIS) 5 MG TABS tablet Take 2 tablets (10 mg total) by mouth 2 (two) times daily for 4 days, THEN 1 tablet (5  mg total) 2 (two) times daily for 26 days. 68 tablet 0   calcium carbonate (TUMS - DOSED IN MG ELEMENTAL CALCIUM) 500 MG chewable tablet Chew 1,000 mg by mouth as needed for heartburn or indigestion.     estradiol (ESTRACE) 0.1 MG/GM vaginal cream PLACE FINGER TIP SIZE AMOUNT OF CREAM AND INSERT SLIGHTLY PAST THE VAGINAL ENTRANCE 3 TIMES DAILY (Patient not taking: Reported on 03/28/2023) 126 g 4   furosemide (LASIX) 40 MG tablet Take 1 tablet (40 mg total) by mouth daily as needed for edema or fluid. 30 tablet 0   lidocaine-prilocaine (EMLA) cream Apply to affected area once (Patient taking differently: Apply 1 Application topically as needed (port access).) 30 g 3   loratadine (CLARITIN) 10 MG tablet Take 10 mg by mouth daily as needed for allergies.     magic mouthwash (nystatin, diphenhydrAMINE, alum & mag hydroxide) suspension mixture Swish and spit 5 mLs 4 (four) times daily as needed for mouth pain. (Patient not taking: Reported on 03/28/2023) 240 mL 0   metoprolol succinate (TOPROL XL) 25 MG 24 hr tablet Take 1 tablet (25 mg total) by mouth at bedtime. 30 tablet 6   OLANZapine (ZYPREXA) 5 MG tablet Take by mouth. (Patient not taking: Reported on 03/28/2023)     ondansetron (ZOFRAN) 8 MG tablet Take 1 tablet (8 mg total) by mouth every 8 (eight) hours as needed. 30 tablet 1   pantoprazole (PROTONIX) 20 MG tablet Take by mouth. (Patient not taking: Reported on 03/28/2023)     prochlorperazine (COMPAZINE) 10 MG tablet Take 1 tablet (10 mg total) by mouth every 6 (six) hours as needed (Nausea or vomiting). 90 tablet 1   senna (SENOKOT) 8.6 MG TABS tablet Take 2 tablets by mouth as needed for mild constipation or moderate constipation.     No current facility-administered medications for this visit.  VITAL SIGNS: There were no vitals taken for this visit. There were no vitals filed for this visit.  Estimated body mass index is 19.07 kg/m as calculated from the following:   Height as of 03/28/23:  5\' 8"  (1.727 m).   Weight as of 03/28/23: 125 lb 7.1 oz (56.9 kg).  LABS: CBC:    Component Value Date/Time   WBC 10.0 04/04/2023 0525   HGB 12.2 04/04/2023 0525   HGB 12.3 02/01/2023 0923   HCT 37.8 04/04/2023 0525   PLT 307 04/04/2023 0525   PLT 287 02/01/2023 0923   MCV 92.2 04/04/2023 0525   NEUTROABS 4.5 02/01/2023 0923   LYMPHSABS 0.6 (L) 02/01/2023 0923   MONOABS 0.5 02/01/2023 0923   EOSABS 0.1 02/01/2023 0923   BASOSABS 0.0 02/01/2023 0923   Comprehensive Metabolic Panel:    Component Value Date/Time   NA 131 (L) 04/04/2023 0525   K 3.5 04/04/2023 0525   CL 86 (L) 04/04/2023 0525   CO2 32 04/04/2023 0525   BUN 20 04/04/2023 0525   CREATININE 0.62 04/04/2023 0525   CREATININE 0.68 02/01/2023 0923   GLUCOSE 136 (H) 04/04/2023 0525   CALCIUM 8.4 (L) 04/04/2023 0525   AST 13 (L) 03/29/2023 0454   AST 13 (L) 02/01/2023 0923   ALT 9 03/29/2023 0454   ALT 8 02/01/2023 0923   ALKPHOS 55 03/29/2023 0454   BILITOT 1.1 03/29/2023 0454   BILITOT 0.4 02/01/2023 0923   PROT 5.5 (L) 03/29/2023 0454   ALBUMIN 2.9 (L) 03/29/2023 0454    RADIOGRAPHIC STUDIES: No results found.  PERFORMANCE STATUS (ECOG) : {CHL ONC ECOG [id number]}  Review of Systems Unless otherwise noted, a complete review of systems is negative.  Physical Exam General: NAD Cardiovascular: regular rate and rhythm Pulmonary: clear ant fields Abdomen: soft, nontender, + bowel sounds Extremities: no edema, no joint deformities Skin: no rashes Neurological:  IMPRESSION: *** I introduced myself, Tallis Soledad RN, and Palliative's role in collaboration with the oncology team. Concept of Palliative Care was introduced as specialized medical care for people and their families living with serious illness.  It focuses on providing relief from the symptoms and stress of a serious illness.  The goal is to improve quality of life for both the patient and the family. Values and goals of care important to patient  and family were attempted to be elicited.    We discussed *** current illness and what it means in the larger context of *** on-going co-morbidities. Natural disease trajectory and expectations were discussed.  I discussed the importance of continued conversation with family and their medical providers regarding overall plan of care and treatment options, ensuring decisions are within the context of the patients values and GOCs.  PLAN: Established therapeutic relationship. Education provided on palliative's role in collaboration with their Oncology/Radiation team. I will plan to see patient back in 2-4 weeks in collaboration to other oncology appointments.    Patient expressed understanding and was in agreement with this plan. She also understands that She can call the clinic at any time with any questions, concerns, or complaints.   Thank you for your referral and allowing Palliative to assist in Mrs. Tammie Gilmore's care.   Number and complexity of problems addressed: ***HIGH - 1 or more chronic illnesses with SEVERE exacerbation, progression, or side effects of treatment - advanced cancer, pain. Any controlled substances utilized were prescribed in the context of palliative care.   Visit consisted of counseling and education dealing with the  complex and emotionally intense issues of symptom management and palliative care in the setting of serious and potentially life-threatening illness.Greater than 50%  of this time was spent counseling and coordinating care related to the above assessment and plan.  Signed by: Willette Alma, AGPCNP-BC Palliative Medicine Team/[facility]   *Please note that this is a verbal dictation therefore any spelling or grammatical errors are due to the "[employer]" system interpretation.

## 2023-07-27 ENCOUNTER — Inpatient Hospital Stay: Payer: Self-pay | Attending: Nurse Practitioner | Admitting: Nurse Practitioner

## 2023-07-30 ENCOUNTER — Telehealth: Payer: Self-pay | Admitting: Nurse Practitioner

## 2023-08-10 ENCOUNTER — Encounter: Payer: Self-pay | Admitting: Radiation Therapy
